# Patient Record
Sex: Male | Born: 1950
Health system: Southern US, Community
[De-identification: ages and names within clinical notes are randomized; demographics above are authoritative.]

## PROBLEM LIST (undated history)

## (undated) DIAGNOSIS — E790 Hyperuricemia without signs of inflammatory arthritis and tophaceous disease: Secondary | ICD-10-CM

## (undated) DIAGNOSIS — M1990 Unspecified osteoarthritis, unspecified site: Secondary | ICD-10-CM

## (undated) DIAGNOSIS — M51369 Other intervertebral disc degeneration, lumbar region without mention of lumbar back pain or lower extremity pain: Secondary | ICD-10-CM

## (undated) DIAGNOSIS — N200 Calculus of kidney: Secondary | ICD-10-CM

## (undated) DIAGNOSIS — H04123 Dry eye syndrome of bilateral lacrimal glands: Secondary | ICD-10-CM

## (undated) DIAGNOSIS — R748 Abnormal levels of other serum enzymes: Secondary | ICD-10-CM

## (undated) DIAGNOSIS — D696 Thrombocytopenia, unspecified: Secondary | ICD-10-CM

## (undated) DIAGNOSIS — I10 Essential (primary) hypertension: Secondary | ICD-10-CM

## (undated) DIAGNOSIS — K219 Gastro-esophageal reflux disease without esophagitis: Secondary | ICD-10-CM

## (undated) DIAGNOSIS — R9389 Abnormal findings on diagnostic imaging of other specified body structures: Secondary | ICD-10-CM

## (undated) DIAGNOSIS — M5136 Other intervertebral disc degeneration, lumbar region: Secondary | ICD-10-CM

## (undated) DIAGNOSIS — D693 Immune thrombocytopenic purpura: Secondary | ICD-10-CM

## (undated) DIAGNOSIS — G4730 Sleep apnea, unspecified: Secondary | ICD-10-CM

## (undated) HISTORY — DX: Calculus of kidney: N20.0

## (undated) HISTORY — DX: Gastro-esophageal reflux disease without esophagitis: K21.9

## (undated) HISTORY — DX: Hyperuricemia without signs of inflammatory arthritis and tophaceous disease: E79.0

## (undated) HISTORY — DX: Dry eye syndrome of bilateral lacrimal glands: H04.123

## (undated) HISTORY — DX: Abnormal findings on diagnostic imaging of other specified body structures: R93.89

## (undated) HISTORY — PX: CARDIAC CATHETERIZATION: SHX172

## (undated) HISTORY — PX: LITHOTRIPSY: SUR834

## (undated) HISTORY — PX: KNEE SURGERY: SHX244

## (undated) HISTORY — PX: OTHER SURGICAL HISTORY: SHX169

---

## 1999-07-05 ENCOUNTER — Emergency Department (HOSPITAL_COMMUNITY): Admission: EM | Admit: 1999-07-05 | Discharge: 1999-07-05 | Payer: Self-pay | Admitting: *Deleted

## 2001-01-04 ENCOUNTER — Emergency Department (HOSPITAL_COMMUNITY): Admission: EM | Admit: 2001-01-04 | Discharge: 2001-01-04 | Payer: Self-pay | Admitting: Emergency Medicine

## 2001-01-04 ENCOUNTER — Encounter: Payer: Self-pay | Admitting: Emergency Medicine

## 2001-07-09 ENCOUNTER — Ambulatory Visit (HOSPITAL_BASED_OUTPATIENT_CLINIC_OR_DEPARTMENT_OTHER): Admission: RE | Admit: 2001-07-09 | Discharge: 2001-07-09 | Payer: Self-pay | Admitting: Urology

## 2001-07-09 ENCOUNTER — Encounter: Payer: Self-pay | Admitting: Urology

## 2002-09-04 ENCOUNTER — Encounter: Payer: Self-pay | Admitting: Emergency Medicine

## 2002-09-04 ENCOUNTER — Emergency Department (HOSPITAL_COMMUNITY): Admission: EM | Admit: 2002-09-04 | Discharge: 2002-09-05 | Payer: Self-pay | Admitting: Emergency Medicine

## 2005-08-22 ENCOUNTER — Encounter: Admission: RE | Admit: 2005-08-22 | Discharge: 2005-08-22 | Payer: Self-pay | Admitting: Orthopedic Surgery

## 2007-07-02 HISTORY — PX: KNEE ARTHROSCOPY: SUR90

## 2009-05-19 ENCOUNTER — Encounter: Payer: Self-pay | Admitting: Internal Medicine

## 2009-06-06 ENCOUNTER — Encounter (INDEPENDENT_AMBULATORY_CARE_PROVIDER_SITE_OTHER): Payer: Self-pay | Admitting: *Deleted

## 2009-06-12 ENCOUNTER — Ambulatory Visit: Payer: Self-pay | Admitting: Internal Medicine

## 2009-06-12 DIAGNOSIS — K644 Residual hemorrhoidal skin tags: Secondary | ICD-10-CM | POA: Insufficient documentation

## 2009-06-12 DIAGNOSIS — K219 Gastro-esophageal reflux disease without esophagitis: Secondary | ICD-10-CM | POA: Insufficient documentation

## 2009-06-12 DIAGNOSIS — E790 Hyperuricemia without signs of inflammatory arthritis and tophaceous disease: Secondary | ICD-10-CM | POA: Insufficient documentation

## 2009-06-12 DIAGNOSIS — R930 Abnormal findings on diagnostic imaging of skull and head, not elsewhere classified: Secondary | ICD-10-CM | POA: Insufficient documentation

## 2009-06-12 DIAGNOSIS — Z87442 Personal history of urinary calculi: Secondary | ICD-10-CM | POA: Insufficient documentation

## 2009-06-19 ENCOUNTER — Encounter (INDEPENDENT_AMBULATORY_CARE_PROVIDER_SITE_OTHER): Payer: Self-pay | Admitting: *Deleted

## 2009-07-01 HISTORY — PX: TOTAL KNEE ARTHROPLASTY: SHX125

## 2009-07-01 HISTORY — PX: COLONOSCOPY W/ POLYPECTOMY: SHX1380

## 2009-07-01 HISTORY — PX: OTHER SURGICAL HISTORY: SHX169

## 2009-08-04 ENCOUNTER — Encounter (INDEPENDENT_AMBULATORY_CARE_PROVIDER_SITE_OTHER): Payer: Self-pay | Admitting: *Deleted

## 2009-08-04 ENCOUNTER — Ambulatory Visit: Payer: Self-pay | Admitting: Gastroenterology

## 2009-08-04 DIAGNOSIS — K625 Hemorrhage of anus and rectum: Secondary | ICD-10-CM | POA: Insufficient documentation

## 2009-08-14 ENCOUNTER — Ambulatory Visit: Payer: Self-pay | Admitting: Gastroenterology

## 2009-08-15 ENCOUNTER — Encounter: Payer: Self-pay | Admitting: Gastroenterology

## 2009-08-15 ENCOUNTER — Telehealth: Payer: Self-pay | Admitting: Gastroenterology

## 2009-08-15 DIAGNOSIS — E538 Deficiency of other specified B group vitamins: Secondary | ICD-10-CM | POA: Insufficient documentation

## 2009-08-15 DIAGNOSIS — R740 Nonspecific elevation of levels of transaminase and lactic acid dehydrogenase [LDH]: Secondary | ICD-10-CM

## 2009-08-15 LAB — CONVERTED CEMR LAB
ALT: 70 units/L — ABNORMAL HIGH (ref 0–53)
Alkaline Phosphatase: 45 units/L (ref 39–117)
BUN: 13 mg/dL (ref 6–23)
Bilirubin, Direct: 0.2 mg/dL (ref 0.0–0.3)
Calcium: 9.2 mg/dL (ref 8.4–10.5)
Chloride: 101 meq/L (ref 96–112)
Ferritin: 47 ng/mL (ref 22.0–322.0)
Glucose, Bld: 119 mg/dL — ABNORMAL HIGH (ref 70–99)
Hemoglobin: 14.9 g/dL (ref 13.0–17.0)
Lymphocytes Relative: 16.2 % (ref 12.0–46.0)
MCHC: 33.3 g/dL (ref 30.0–36.0)
Monocytes Relative: 8.4 % (ref 3.0–12.0)
Neutrophils Relative %: 70.7 % (ref 43.0–77.0)
Platelets: 145 10*3/uL — ABNORMAL LOW (ref 150.0–400.0)
RBC: 4.28 M/uL (ref 4.22–5.81)
RDW: 13 % (ref 11.5–14.6)
Sed Rate: 16 mm/hr (ref 0–22)
Total Protein: 7.1 g/dL (ref 6.0–8.3)
Transferrin: 335.5 mg/dL (ref 212.0–360.0)
WBC: 5 10*3/uL (ref 4.5–10.5)

## 2009-08-18 ENCOUNTER — Telehealth (INDEPENDENT_AMBULATORY_CARE_PROVIDER_SITE_OTHER): Payer: Self-pay | Admitting: *Deleted

## 2009-08-18 ENCOUNTER — Ambulatory Visit: Payer: Self-pay | Admitting: Gastroenterology

## 2009-08-18 LAB — CONVERTED CEMR LAB: Prothrombin Time: 10.8 s (ref 9.1–11.7)

## 2009-08-25 LAB — CONVERTED CEMR LAB: HCV Ab: NEGATIVE

## 2009-12-04 ENCOUNTER — Encounter: Payer: Self-pay | Admitting: Internal Medicine

## 2010-01-09 ENCOUNTER — Telehealth: Payer: Self-pay | Admitting: Gastroenterology

## 2010-01-10 ENCOUNTER — Telehealth: Payer: Self-pay | Admitting: Gastroenterology

## 2010-03-02 ENCOUNTER — Inpatient Hospital Stay (HOSPITAL_COMMUNITY): Admission: RE | Admit: 2010-03-02 | Discharge: 2010-03-05 | Payer: Self-pay | Admitting: Orthopedic Surgery

## 2010-06-07 ENCOUNTER — Encounter: Payer: Self-pay | Admitting: Internal Medicine

## 2010-08-02 NOTE — Procedures (Signed)
Summary: Colonoscopy  Patient: [name] Note: All result statuses are Final unless otherwise noted.  Tests: (1) Colonoscopy (COL)   COL Colonoscopy           DONE     [facility]     [address]     [place], [place]  [address]           COLONOSCOPY PROCEDURE REPORT           PATIENT:  Timothy Mckinney, Timothy Mckinney  MR#:  [id number]     BIRTHDATE:  May 31, 1951, 58 yrs. old  GENDER:  male           ENDOSCOPIST:  Vania Rea. Jarold Motto, MD, [facility]     Referred by:           PROCEDURE DATE:  08/14/2009     PROCEDURE:  Colonoscopy with biopsy and snare polypectomy     ASA CLASS:  Class II     INDICATIONS:  rectal bleeding           MEDICATIONS:   Fentanyl 100 mcg IV, Versed 10 mg IV           DESCRIPTION OF PROCEDURE:   After the risks benefits and     alternatives of the procedure were thoroughly explained, informed     consent was obtained.  Digital rectal exam was performed and     revealed no abnormalities.   The LB [id number] endoscope     was introduced through the anus and advanced to the cecum, which     was identified by both the appendix and ileocecal valve, without     limitations.  The quality of the prep was excellent, using     MoviPrep.  The instrument was then slowly withdrawn as the colon     was fully examined.     <<PROCEDUREIMAGES>>           FINDINGS:  There were multiple polyps identified and removed.     sigmoid to descending FLAT POLYPS EXCISED WITH COLD SNARE.  An     ulcer was found in the rectum. SOLITARY RECTAL ULCER BIOPSIED.     ULCERATION NOTED.  The scope was then withdrawn from the patient and     the procedure completed.           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     RECOMMENDATIONS:     CANASA 1GM. SUPP QHS FOR 1 MONTH.#31,REFILL X 6.           REPEAT EXAM:  No        ______________________________     Vania Rea. Jarold Motto, MD, [facility]           CC:  Pecola Lawless, MD           n.     Rosalie DoctorMarland Kitchen   Vania Rea. Patterson at 08/14/2009 12:09 PM           Joaquin Music, [phone]  Note: An exclamation mark (!) indicates a result that was not dispersed into the flowsheet. Document Creation Date: 08/14/2009 12:10 PM _______________________________________________________________________  (1) Order result status: Final Collection or observation date-time: 08/14/2009 12:02 Requested date-time:  Receipt date-time:  Reported date-time:  Referring Physician:  Ordering Physician: Sheryn Bison [phone]) Specimen Source:  Source: Launa Grill Order Number: [id number] Lab site:   Appended Document: Colonoscopy     Procedures Next Due Date:    Colonoscopy: 08/2014

## 2010-08-02 NOTE — Progress Notes (Signed)
Summary: Medication  Phone Note Call from Patient Call back at Work Phone [phone]   Caller: Patient Call For: Dr. Jarold Motto Reason for Call: Refill Medication Summary of Call: pt. needs a refill of his Canasa for supply   [facility] Initial call taken by: Karna Christmas,  January 09, 2010 2:13 PM    Prescriptions: CANASA 1000 MG  SUPP (MESALAMINE) canasa suppository  gm 1 q hs x 1 month  #90 x 1   Entered by:   Ashok Cordia RN   Authorized by:   Mardella Layman MD [facility]   Signed by:   Ashok Cordia RN on 01/09/2010   Method used:   Electronically to        [facility]* (retail)       [address]       [address] Shipping/mailing       [place], [place]  [address]       Ph: [phone]       Fax: [phone]   RxID:   [id number]

## 2010-08-02 NOTE — Assessment & Plan Note (Signed)
Summary: #1 of 3 weekly B12 inj, 266.2, dfs  Nurse Visit   Allergies: 1)  ! Penicillin  Medication Administration  Injection # 1:    Medication: Vit B12 1000 mcg    Diagnosis: B12 DEFICIENCY (ICD-266.2)    Route: IM    Site: R deltoid    Exp Date: 05/2011    Lot #: [id number]    Mfr: [employer] Regent    Comments: Pt to be given the rest of his injections at home by his wife. Lupita Leash Aware    Patient tolerated injection without complications    Given by: Merri Ray CMA (AAMA) (August 18, 2009 9:15 AM)  Orders Added: 1)  Vit B12 1000 mcg [J3420]

## 2010-08-02 NOTE — Assessment & Plan Note (Signed)
Summary: HEMORROIDS EXTERNAL   History of Present Illness Visit Type: consult Primary GI MD: Sheryn Bison MD FACP FAGA Primary Provider: Marga Melnick, MD Requesting Provider: Marga Melnick, MD Chief Complaint: hemorrhoids  History of Present Illness:   60 year old Caucasian male with a chronic history of hemorrhoidal bleeding with last colonoscopy 12 years ago. He continues with mild constipation, gas, bloating, bright red blood with straining. He follows a regular diet and denies specific food intolerances and has recently been placed on a low carbohydrate diet by primary care. Family history noncontributory. His rectal bleed occurs with hard stools and he does sometimes have to manually disimpact his rectum. Family history is noncontributory except for polyps in his mother.. Review of his records does not show recent labs.   GI Review of Systems    Reports acid reflux and  heartburn.      Denies abdominal pain, belching, bloating, chest pain, dysphagia with liquids, dysphagia with solids, loss of appetite, nausea, vomiting, vomiting blood, weight loss, and  weight gain.      Reports hemorrhoids and  rectal bleeding.     Denies anal fissure, black tarry stools, change in bowel habit, constipation, diarrhea, diverticulosis, fecal incontinence, heme positive stool, irritable bowel syndrome, jaundice, light color stool, liver problems, and  rectal pain.    Current Medications (verified): 1)  Allopurinol 300 Mg Tabs (Allopurinol) .Marland Kitchen.. 1 By Mouth Once Daily 2)  Urocit-K 10 10 Meq (1080 Mg) Cr-Tabs (Potassium Citrate) .... 2 By Mouth Three Times A Day 3)  Sodium 650mg  .... 3 By Mouth Three Times A Day To Raise Urine Ph 4)  Restasis 0.05 % Emul (Cyclosporine) .Marland Kitchen.. 1 Drop in Each Eye Daily 5)  Flax Seed Oil 1000 Mg Caps (Flaxseed (Linseed)) .... 2 By Mouth Once Daily 6)  Proctofoam 1 % Foam (Pramoxine Hcl) .... Apply Two Times A Day After Sitz Bath 7)  Tylenol Pm Extra Strength 500-25 Mg  Tabs (Diphenhydramine-Apap (Sleep)) .... One Tablet By Mouth At Bedtime  Allergies (verified): 1)  ! Penicillin  Past History:  Past medical, surgical, family and social histories (including risk factors) reviewed for relevance to current acute and chronic problems.  Past Medical History: Reviewed history from 06/12/2009 and no changes required. 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 from topical steroids for ? conjunctivitis, Dr Hazle Quant; Abnormal Chest Xray , PMH of Nephrolithiasis, hx of, Dr Aldean Ast, S/P Lithotripsy X1 GERD  Past Surgical History: Reviewed history from 06/12/2009 and no changes required. Tear duct plugs; Cath in 40s for chest pain , negative, ERD diagnosed; Athroscopy 2009 for medial meniscus tear( pre op CPX done 05/2008, labs WNL)  Family History: Reviewed history and no changes required. No FH of Colon Cancer: Family History of Colon Polyps:Mother   Social History: Reviewed history and no changes required. Occupation: Sales Married 2 childern Patient has never smoked.  Alcohol Use - yes: 2 daily  Illicit Drug Use - no Smoking Status:  never Drug Use:  no  Review of Systems       The patient complains of arthritis/joint pain and sleeping problems.  The patient denies allergy/sinus, anemia, anxiety-new, back pain, blood in urine, breast changes/lumps, change in vision, confusion, cough, coughing up blood, depression-new, fainting, fatigue, fever, headaches-new, hearing problems, heart murmur, heart rhythm changes, itching, muscle pains/cramps, night sweats, nosebleeds, shortness of breath, skin rash, sore throat, swelling of feet/legs, swollen lymph glands, thirst - excessive, urination - excessive, urination changes/pain, urine leakage, vision changes, and voice  change.    Vital Signs:  Patient profile:   60 year old male Height:      71.75 inches Weight:      289 pounds BMI:     39.61 BSA:     2.49 Pulse rate:   76 /  minute Pulse rhythm:   regular BP sitting:   134 / 80  (left arm) Cuff size:   large  Vitals Entered By: Ok Anis CMA (August 04, 2009 9:52 AM)  Physical Exam  General:  Well developed, well nourished, no acute distress.obese.   Head:  Normocephalic and atraumatic. Eyes:  PERRLA, no icterus.exam deferred to patient's ophthalmologist.  exam deferred to patient's ophthalmologist.   Lungs:  Clear throughout to auscultation. Heart:  Regular rate and rhythm; no murmurs, rubs,  or bruits. Abdomen:  Soft, nontender and nondistended. No masses, hepatosplenomegaly or hernias noted. Normal bowel sound. Ventral hernia present in the epigastric area. There are no masses or tenderness or organomegaly. Rectal:  External hemorrhoids with hypertrophied papilla noted. Digital exam not performed.deferred until time of colonoscopy.  deferred until time of colonoscopy.   Extremities:  No clubbing, cyanosis, edema or deformities noted. Neurologic:  Alert and  oriented x4;  grossly normal neurologically. Inguinal Nodes:  No significant inguinal adenopathy. Psych:  Alert and cooperative. Normal mood and affect.   Impression & Recommendations:  Problem # 1:  RECTAL BLEEDING (ICD-569.3) Assessment Unchanged  Colonoscopy scheduled at his convenience. I have placed him on Phillip'sColon Health preparation twice a day, have asked him to avoid sorbitol and fructose, continue liberal p.o. fluids and local hemorrhoidal care. Screening labs also have been ordered. The patient is not interested in surgical treatment of his hemorrhoids. He does give a history of some manual disimpaction needs suggesting anorectal outlet dysfunction. Depending on colonoscopy exam, he may need referral for anorectal manometry. Orders: TLB-CBC Platelet - w/Differential (85025-CBCD) TLB-BMP (Basic Metabolic Panel-BMET) (80048-METABOL) TLB-Hepatic/Liver Function Pnl (80076-HEPATIC) TLB-TSH (Thyroid Stimulating Hormone)  (84443-TSH) TLB-B12, Serum-Total ONLY ([address]-U04) TLB-Ferritin (82728-FER) TLB-Folic Acid (Folate) (82746-FOL) TLB-IBC Pnl (Iron/FE;Transferrin) (83550-IBC) TLB-Sedimentation Rate (ESR) (85652-ESR) Colonoscopy (Colon)  Problem # 2:  FAMILY HISTORY COLONIC POLYPS (ICD-V18.51) Assessment: Comment Only  Orders: Colonoscopy (Colon)  Problem # 3:  HEMORRHOIDS, EXTERNAL (ICD-455.3) Assessment: Improved continue ProctoFoam and Sitz mass twice a day. Labs have been ordered to exclude significant iron deficiency. Orders: TLB-CBC Platelet - w/Differential (85025-CBCD) TLB-BMP (Basic Metabolic Panel-BMET) (80048-METABOL) TLB-Hepatic/Liver Function Pnl (80076-HEPATIC) TLB-TSH (Thyroid Stimulating Hormone) (84443-TSH) TLB-B12, Serum-Total ONLY (54098-J19) TLB-Ferritin (82728-FER) TLB-Folic Acid (Folate) (82746-FOL) TLB-IBC Pnl (Iron/FE;Transferrin) (83550-IBC) TLB-Sedimentation Rate (ESR) (85652-ESR) Colonoscopy (Colon)  Problem # 4:  GERD (ICD-530.81) Assessment: Improved He Denies any upper gastrointestinal or hepatobiliary complaints at this time.  Patient Instructions: 1)  Copy sent to : Dr. Marga Melnick 2)  Diet should be high in fiber ( fruits, vegetables, whole grains) but low in residue. Drink at least eight (8) glasses of water a day.  3)  Constipation and Hemorrhoids brochure given.  4)  Colonoscopy and Flexible Sigmoidoscopy brochure given.  5)  Conscious Sedation brochure given.  6)  Labs Pending. 7)  Begin Vear Clock Colon health two times a day. 8)  The medication list was reviewed and reconciled.  All changed / newly prescribed medications were explained.  A complete medication list was provided to the patient / caregiver. Prescriptions: MOVIPREP 100 GM  SOLR (PEG-KCL-NACL-NASULF-NA ASC-C) As per prep instructions.  #1 x 0   Entered by:   Ashok Cordia RN   Authorized  by:   Mardella Layman MD [facility]   Signed by:   Ashok Cordia RN on 08/04/2009   Method used:    Electronically to        [id number]* (retail)       [address]       [place], [place]  [address]       Ph: [phone] or [phone]       Fax: [phone]   RxID:   [id number]

## 2010-08-02 NOTE — Progress Notes (Signed)
Summary: Triage  Phone Note Call from Patient Call back at Work Phone [phone]   Caller: Patient Call For: Dr. Jarold Motto Summary of Call: Wants to discuss having some B-12 shots and some bloodwork done Initial call taken by: Karna Christmas,  August 15, 2009 1:56 PM  Follow-up for Phone Call        Pt will be in town Friday and would like to get B12 inj and have labs drawn.  (see append to labs)   Wife is in nursing school and she will be able to give the rest of the B12 injections.   Pt will have to let us know when he can go for the [place]. Follow-up by: Ashok Cordia RN,  August 15, 2009 2:23 PM

## 2010-08-02 NOTE — Letter (Signed)
Summary: [facility]  [facility]   Imported By: Lanelle Bal 12/21/2009 09:19:02  _____________________________________________________________________  External Attachment:    Type:   Image     Comment:   External Document

## 2010-08-02 NOTE — Letter (Signed)
Summary: Patient Notice- Polyp Results  [facility]  [address] [place], [place] [address]   Phone: [phone]  Fax: [phone]        August 15, 2009 MRN: [id number]    BLISS TSANG [address] [place], [place]  [address]    Dear Mr. Soldo,  I am pleased to inform you that the colon polyp(s) removed during your recent colonoscopy was (were) found to be benign (no cancer detected) upon pathologic examination.  I recommend you have a repeat colonoscopy examination in 5_ years to look for recurrent polyps, as having colon polyps increases your risk for having recurrent polyps or even colon cancer in the future.  Should you develop new or worsening symptoms of abdominal pain, bowel habit changes or bleeding from the rectum or bowels, please schedule an evaluation with either your primary care physician or with me.  Additional information/recommendations:  __ No further action with gastroenterology is needed at this time. Please      follow-up with your primary care physician for your other healthcare      needs.  __ Please call [phone] to schedule a return visit to review your      situation.  __ Please keep your follow-up visit as already scheduled.  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 Please call us if you are having persistent problems or have questions about your condition that have not been fully answered at this time.  Sincerely,  Mardella Layman MD [facility]  This letter has been electronically signed by your physician.  Appended Document: Patient Notice- Polyp Results Letter mailed 2.16.11

## 2010-08-02 NOTE — Letter (Signed)
Summary: [facility] Instructions  [facility]  [address] [place], [place] [address]   Phone: [phone]  Fax: [phone]       Timothy Mckinney    11-30-1950    MRN: [id number]        Procedure Day /Date:  Monday, 08/14/09     Arrival Time: 10:30      Procedure Time: 11:30     [facility] of Procedure:                    [facility]                        PREPARATION FOR COLONOSCOPY WITH MOVIPREP   Starting 5 days prior to your procedure 08/09/09 do not eat nuts, seeds, popcorn, corn, beans, peas,  salads, or any raw vegetables.  Do not take any fiber supplements (e.g. Metamucil, Citrucel, and Benefiber).  THE DAY BEFORE YOUR PROCEDURE         DATE: 08/13/09  DAY: Sunday  1.  Drink clear liquids the entire day-NO SOLID FOOD  2.  Do not drink anything colored red or purple.  Avoid juices with pulp.  No orange juice.  3.  Drink at least 64 oz. (8 glasses) of fluid/clear liquids during the day to prevent dehydration and help the prep work efficiently.  CLEAR LIQUIDS INCLUDE: Water Jello Ice Popsicles Tea (sugar ok, no milk/cream) Powdered fruit flavored drinks Coffee (sugar ok, no milk/cream) Gatorade Juice: apple, white grape, white cranberry  Lemonade Clear bullion, consomm, broth Carbonated beverages (any kind) Strained chicken noodle soup Hard Candy                             4.  In the morning, mix first dose of MoviPrep solution:    Empty 1 Pouch A and 1 Pouch B into the disposable container    Add lukewarm drinking water to the top line of the container. Mix to dissolve    Refrigerate (mixed solution should be used within 24 hrs)  5.  Begin drinking the prep at 5:00 p.m. The MoviPrep container is divided by 4 marks.   Every 15 minutes drink the solution down to the next mark (approximately 8 oz) until the full liter is complete.   6.  Follow completed prep with 16 oz of clear liquid of your choice (Nothing red  or purple).  Continue to drink clear liquids until bedtime.  7.  Before going to bed, mix second dose of MoviPrep solution:    Empty 1 Pouch A and 1 Pouch B into the disposable container    Add lukewarm drinking water to the top line of the container. Mix to dissolve    Refrigerate  THE DAY OF YOUR PROCEDURE      DATE: 08/14/09  DAY: Monday  Beginning at 6;30 a.m. (5 hours before procedure):         1. Every 15 minutes, drink the solution down to the next mark (approx 8 oz) until the full liter is complete.  2. Follow completed prep with 16 oz. of clear liquid of your choice.    3. You may drink clear liquids until 9:30  (2 HOURS BEFORE PROCEDURE).   MEDICATION INSTRUCTIONS  Unless otherwise instructed, you should take regular prescription medications with a small sip of water   as early as possible the morning  of your procedure.                 OTHER INSTRUCTIONS  You will need a responsible adult at least 62 years of age to accompany you and drive you home.   This person must remain in the waiting room during your procedure.  Wear loose fitting clothing that is easily removed.  Leave jewelry and other valuables at home.  However, you may wish to bring a book to read or  an iPod/MP3 player to listen to music as you wait for your procedure to start.  Remove all body piercing jewelry and leave at home.  Total time from sign-in until discharge is approximately 2-3 hours.  You should go home directly after your procedure and rest.  You can resume normal activities the  day after your procedure.  The day of your procedure you should not:   Drive   Make legal decisions   Operate machinery   Drink alcohol   Return to work  You will receive specific instructions about eating, activities and medications before you leave.    The above instructions have been reviewed and explained to me by   _______________________    I fully understand and can  verbalize these instructions _____________________________ Date _________

## 2010-08-02 NOTE — Progress Notes (Signed)
Summary: Appointment  Phone Note Call from Patient   Caller: Patient Summary of Call: Pt calling. Asking if he needs follow up appt.  Pt states he is doing very well using canasa supp as needed.  Pt instructed to call if symptoms worsened and we could sch follow up.  refill given. Initial call taken by: Ashok Cordia RN,  January 10, 2010 4:10 PM

## 2010-08-02 NOTE — Miscellaneous (Signed)
Summary: canasa rx  Clinical Lists Changes  Medications: Added new medication of CANASA 1000 MG  SUPP (MESALAMINE) canasa suppository  gm 1 q hs x 1 month - Signed Rx of CANASA 1000 MG  SUPP (MESALAMINE) canasa suppository  gm 1 q hs x 1 month;  #31 x 6;  Signed;  Entered by: Weston Brass;  Authorized by: Mardella Layman MD [facility];  Method used: Electronically to [facility] [address] [facility] [id number]*, [address] [place], [place], [place]  [address], Ph: [phone] or [phone], Fax: [phone] Observations: Added new observation of ALLERGY REV: Done (08/14/2009 12:24)    Prescriptions: CANASA 1000 MG  SUPP (MESALAMINE) canasa suppository  gm 1 q hs x 1 month  #31 x 6   Entered by:   Weston Brass   Authorized by:   Mardella Layman MD [facility]   Signed by:   Weston Brass on 08/14/2009   Method used:   Electronically to        [facility]  [address] [facility]* (retail)       [address]       [place], [place]  [address]       Ph: [phone] or [phone]       Fax: [phone]   RxID:   [id number]

## 2010-08-02 NOTE — Progress Notes (Signed)
Summary: B12   Phone Note Outgoing Call   Summary of Call: Pt request that we call in B12 and syringe.  His wife is going to give him the injections. Initial call taken by: Ashok Cordia RN,  August 18, 2009 4:41 PM    New/Updated Medications: CYANOCOBALAMIN 1000 MCG/ML INJ SOLN (CYANOCOBALAMIN) 1 cc IM  monthly SYRINGE DISPOSABLE 3 ML MISC (SYRINGE (DISPOSABLE)) Inject B12 IM monthly Prescriptions: SYRINGE DISPOSABLE 3 ML MISC (SYRINGE (DISPOSABLE)) Inject B12 IM monthly  #12 x 6   Entered by:   Ashok Cordia RN   Authorized by:   Mardella Layman MD [facility]   Signed by:   Ashok Cordia RN on 08/18/2009   Method used:   Electronically to        [facility]  [address] [facility]* (retail)       [address]       [place], [place]  [address]       Ph: [phone] or [phone]       Fax: [phone]   RxID:   [id number] CYANOCOBALAMIN 1000 MCG/ML INJ SOLN (CYANOCOBALAMIN) 1 cc IM  monthly  #12 x 6   Entered by:   Ashok Cordia RN   Authorized by:   Mardella Layman MD [facility]   Signed by:   Ashok Cordia RN on 08/18/2009   Method used:   Electronically to        [id number]* (retail)       [address]       [place], [place]  [address]       Ph: [phone] or [phone]       Fax: [phone]   RxID:   [id number]

## 2010-08-02 NOTE — Letter (Signed)
Summary: [facility]  [facility]   Imported By: Lanelle Bal 06/27/2010 09:48:41  _____________________________________________________________________  External Attachment:    Type:   Image     Comment:   External Document

## 2010-09-13 LAB — BASIC METABOLIC PANEL
BUN: 4 mg/dL — ABNORMAL LOW (ref 6–23)
CO2: 27 mEq/L (ref 19–32)
CO2: 28 mEq/L (ref 19–32)
Calcium: 8.1 mg/dL — ABNORMAL LOW (ref 8.4–10.5)
Calcium: 8.1 mg/dL — ABNORMAL LOW (ref 8.4–10.5)
Calcium: 8.4 mg/dL (ref 8.4–10.5)
Chloride: 102 mEq/L (ref 96–112)
Creatinine, Ser: 0.97 mg/dL (ref 0.4–1.5)
GFR calc Af Amer: >60 mL/min (ref >60)
GFR calc non Af Amer: >60 mL/min (ref >60)
GFR calc non Af Amer: >60 mL/min (ref >60)
Glucose, Bld: 121 mg/dL — ABNORMAL HIGH (ref 70–99)
Potassium: 3.5 mEq/L (ref 3.5–5.1)
Potassium: 3.9 mEq/L (ref 3.5–5.1)
Sodium: 134 mEq/L — ABNORMAL LOW (ref 135–145)
Sodium: 135 mEq/L (ref 135–145)
Sodium: 136 mEq/L (ref 135–145)

## 2010-09-13 LAB — CBC
HCT: 28.8 % — ABNORMAL LOW (ref 39.0–52.0)
HCT: 34.4 % — ABNORMAL LOW (ref 39.0–52.0)
Hemoglobin: 9.8 g/dL — ABNORMAL LOW (ref 13.0–17.0)
MCH: 35.2 pg — ABNORMAL HIGH (ref 26.0–34.0)
MCH: 35.7 pg — ABNORMAL HIGH (ref 26.0–34.0)
MCHC: 34 g/dL (ref 30.0–36.0)
MCHC: 34.3 g/dL (ref 30.0–36.0)
Platelets: 111 10*3/uL — ABNORMAL LOW (ref 150–400)
RBC: 2.82 MIL/uL — ABNORMAL LOW (ref 4.22–5.81)
RDW: 13.3 % (ref 11.5–15.5)
WBC: 3.9 10*3/uL — ABNORMAL LOW (ref 4.0–10.5)
WBC: 4.9 10*3/uL (ref 4.0–10.5)
WBC: 5.7 10*3/uL (ref 4.0–10.5)

## 2010-09-13 LAB — PROTIME-INR
INR: 1.16 (ref 0.00–1.49)
INR: 1.45 (ref 0.00–1.49)
Prothrombin Time: 15 seconds (ref 11.6–15.2)
Prothrombin Time: 17.8 seconds — ABNORMAL HIGH (ref 11.6–15.2)

## 2010-09-14 LAB — BASIC METABOLIC PANEL
CO2: 30 mEq/L (ref 19–32)
Calcium: 8.9 mg/dL (ref 8.4–10.5)
Creatinine, Ser: 0.89 mg/dL (ref 0.4–1.5)

## 2010-09-14 LAB — DIFFERENTIAL
Basophils Absolute: 0 10*3/uL (ref 0.0–0.1)
Eosinophils Absolute: 0.1 10*3/uL (ref 0.0–0.7)
Eosinophils Relative: 3 % (ref 0–5)
Lymphocytes Relative: 36 % (ref 12–46)
Lymphs Abs: 1.5 10*3/uL (ref 0.7–4.0)
Monocytes Absolute: 0.4 10*3/uL (ref 0.1–1.0)
Monocytes Relative: 11 % (ref 3–12)

## 2010-09-14 LAB — URINALYSIS, ROUTINE W REFLEX MICROSCOPIC
Bilirubin Urine: NEGATIVE
Glucose, UA: NEGATIVE mg/dL
Hgb urine dipstick: NEGATIVE
Protein, ur: NEGATIVE mg/dL
pH: 8.5 — ABNORMAL HIGH (ref 5.0–8.0)

## 2010-09-14 LAB — CBC
MCV: 102.3 fL — ABNORMAL HIGH (ref 78.0–100.0)
RBC: 4.31 MIL/uL (ref 4.22–5.81)

## 2010-09-14 LAB — ABO/RH: ABO/RH(D): O POS

## 2010-09-14 LAB — TYPE AND SCREEN
ABO/RH(D): O POS
Antibody Screen: NEGATIVE

## 2010-09-14 LAB — APTT: aPTT: 30 seconds (ref 24–37)

## 2010-09-14 LAB — SURGICAL PCR SCREEN
MRSA, PCR: NEGATIVE
Staphylococcus aureus: POSITIVE — AB

## 2010-09-14 LAB — PROTIME-INR: Prothrombin Time: 12.6 seconds (ref 11.6–15.2)

## 2010-11-16 NOTE — Consult Note (Signed)
[facility]  Patient:    Timothy Mckinney, Timothy Mckinney                         MRN: [id number] Proc. Date: 01/04/01 Adm. Date:  [id number] Disc. Date: [id number] Attending:  Ilene Qua CC:         Lilyan Punt Sydnee Levans, M.D.  Danice Goltz, M.D.   Consultation Report  CHIEF COMPLAINT:  Chest pain.  HISTORY OF PRESENT ILLNESS:  [name] is a 60 year old white male with a history of obesity and long standing gastroesophageal reflux disease. He presents tonight with a complaint of chest pain. The patient states he has been having chest pain off and on for the past two weeks. It has not been associated with exertion. His pain is described as a mid sternal pressure associated with epigastric discomfort and indigestion and has usually been relieved with over the counter Zantac but today his pain did not go away after taking Zantac and he came to the emergency room. His pain did resolve prior to being admitted to the emergency room. The patient reports that he has been under increased job related stress. His diet has been for a lot of fast food and the patient also gorged himself last night at a lobsterfest. He also admits that he has been drinking a lot of alcohol over the past four days with the holiday weekend.  The patient reports that he has had the same pain in the past. He was admitted eight years ago at a hospital in [place] with the exact same pain and had a normal cardiac catheterization. He was admitted here in January of 2001 with the same pain and apparently had an upper GI series done and a chest CT.  PAST MEDICAL HISTORY:  Significant for obesity, kidney stones, gastroesophageal reflux disease. He has a history of lung nodules and was worked up by Dr. Jayme Cloud in January of 2001 and had a chest CT at that time. This was felt to be benign. He has had no prior surgeries.  ALLERGIES:  PENICILLIN.  MEDICATIONS:  Allopurinol and p.r.n. Zantac.  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 He is married. He denies smoking. He does drink at least a moderate amount of alcohol with recent increased use.  FAMILY HISTORY:  Father died age 39 of myocardial infarction. Mother died with colon cancer at age 62.  REVIEW OF SYSTEMS:  No change in bowel habits. No bleeding. No edema or orthopnea. No flank pain.  PHYSICAL EXAMINATION:  VITAL SIGNS:  Blood pressure is 130.84, pulse 76 and regular. He is afebrile. Temperature is 98.8.  HEENT:  Pupils equal round and reactive to light and accommodation. Extraocular movements intact.  NECK:  Supple without JVD, adenopathy, thyromegaly or bruits.  LUNGS:  Clear.  CARDIAC:  Reveals a regular rate and rhythm without murmurs, rubs or gallops or clicks.  ABDOMEN:  Obese, soft, nontender. Bowel sounds are positive.  EXTREMITIES:  Without edema. Pulses are 2+ and symmetric.  NEUROLOGIC:  Intact.  LABORATORY DATA:  ECG shows normal sinus rhythm with incomplete right bundle branch block otherwise normal. Chest x-ray shows a right upper lobe nodule versus mass. This was described on a previous report in January of 2001 but those old films are not available for comparison. CK was 222 with 1.4 MB, troponin 0.04. Sodium 136, potassium 3.6, chloride 105, CO2 24, BUN 11, creatinine 0.9, glucose 99, SGOT  43, SGPT of 44. White count 5900, hemoglobin 14.4, hematocrit 41.2, platelets 190,000, coags are normal.  IMPRESSION:  1. Chest pain consistent with gastroesophageal reflux disease. Exacerbated     by recent poor diet, alcohol use and stress.  2. Obesity.  3. Right upper lobe mass, old.  4. Kidney stones.  5. Family history of coronary disease.  PLAN:  The patient will be discharged home on Protonix 40 mg per day. He was instructed to avoid spicy or high fat foods. He was instructed to stop alcohol and loose weight. He was scheduled for a follow-up stress test as  an outpatient. DD:  01/04/01 TD:  01/05/01 Job: [id number]

## 2011-10-02 ENCOUNTER — Encounter (HOSPITAL_COMMUNITY): Payer: Self-pay | Admitting: Pharmacy Technician

## 2011-10-04 ENCOUNTER — Ambulatory Visit (INDEPENDENT_AMBULATORY_CARE_PROVIDER_SITE_OTHER): Payer: 59 | Admitting: Internal Medicine

## 2011-10-04 ENCOUNTER — Encounter: Payer: Self-pay | Admitting: Internal Medicine

## 2011-10-04 VITALS — BP 146/98 | HR 98 | Temp 98.0°F | Resp 12 | Ht 71.25 in | Wt 291.6 lb

## 2011-10-04 DIAGNOSIS — I10 Essential (primary) hypertension: Secondary | ICD-10-CM

## 2011-10-04 DIAGNOSIS — E538 Deficiency of other specified B group vitamins: Secondary | ICD-10-CM

## 2011-10-04 DIAGNOSIS — D696 Thrombocytopenia, unspecified: Secondary | ICD-10-CM

## 2011-10-04 DIAGNOSIS — Z01818 Encounter for other preprocedural examination: Secondary | ICD-10-CM

## 2011-10-04 MED ORDER — METOPROLOL TARTRATE 25 MG PO TABS: 25.0000 mg | ORAL_TABLET | Freq: Two times a day (BID) | ORAL | Status: DC

## 2011-10-04 NOTE — H&P (Signed)
Timothy Mckinney is an 61 y.o. male.    Chief Complaint: Infected left total knee replacement  HPI: Pt is a 61 y.o. male with a history of an arthroscopy on this knee in 2009 and total knee replacement in 2011.  He states that he has had slight pain in it for awhile, but recently while in a restaurant the knee was hip by a chair.  After getting hit, the knee pain increased in pain. He presented to the clinic where the knee was aspirated which revealed elevated white cells in the knee. Dr. Charlann Boxer was consulted for treatment of the left knee. Various options are discussed with the patient. Risks, benefits and expectations were discussed with the patient. Patient understand the risks, benefits and expectations and wishes to proceed with surgery.    PCP:  Marga Melnick, MD, MD  D/C Plans:  Home with HHPT  Post-op Meds:  Rx given for ASA, Robaxin, Celebrex, Iron, Colace and MiraLax  Tranexamic Acid:   To be given  Decadron:   Not to be given  PMH: Past Medical History  Diagnosis Date  . GERD (gastroesophageal reflux disease)   . Dry eyes, bilateral     Dr Hazle Quant  . Nephrolithiasis     Dr Aldean Ast  . Hyperuricemia   . Abnormal chest x-ray     ? granulomata    PSH: Past Surgical History  Procedure Date  . Cardiac catheterization     in 40s, negative  . Tear duct plugs   . Knee arthroscopy 2009     L  . Colonoscopy w/ polypectomy 2011    Dr Jarold Motto  . Colon ulcer 2011  . Total knee arthroplasty 2011    Workman's Comp case, Dr Ranell Patrick  . Lithotripsy     Social History:  reports that he has never smoked. He does not have any smokeless tobacco history on file. He reports that he drinks alcohol. He reports that he does not use illicit drugs.  Allergies:  Allergies  Allergen Reactions  . Penicillins Swelling    Diffuse swelling    Medications: No current facility-administered medications for this encounter.   Current Outpatient Prescriptions  Medication Sig Dispense Refill   . allopurinol (ZYLOPRIM) 300 MG tablet Take 300 mg by mouth daily.      . celecoxib (CELEBREX) 200 MG capsule Take 200 mg by mouth daily.      . cycloSPORINE (RESTASIS) 0.05 % ophthalmic emulsion Place 1 drop into both eyes daily.      . methocarbamol (ROBAXIN) 500 MG tablet Take 500 mg by mouth daily.      . metoprolol tartrate (LOPRESSOR) 25 MG tablet Take 1 tablet (25 mg total) by mouth 2 (two) times daily.  60 tablet  0  . potassium citrate (UROCIT-K) 10 MEQ (1080 MG) SR tablet Take 20 mEq by mouth 3 (three) times daily with meals.      . sodium bicarbonate 650 MG tablet Take 1,950 mg by mouth 3 (three) times daily.         ROS: Review of Systems  Constitutional: Negative.   HENT: Negative.   Eyes: Negative.   Respiratory: Negative.   Cardiovascular: Negative.   Gastrointestinal: Negative.   Genitourinary: Negative.   Musculoskeletal: Positive for joint pain.  Skin: Negative.   Neurological: Negative.   Endo/Heme/Allergies: Negative.   Psychiatric/Behavioral: Negative.      Physical Exam: Physical Exam  Constitutional: He is oriented to person, place, and time and well-developed, well-nourished, and in no  distress.  HENT:  Head: Normocephalic and atraumatic.  Nose: Nose normal.  Mouth/Throat: Oropharynx is clear and moist.  Eyes: Pupils are equal, round, and reactive to light.  Neck: Neck supple. No JVD present. No tracheal deviation present. No thyromegaly present.  Cardiovascular: Regular rhythm and intact distal pulses.  Tachycardia present.   Pulmonary/Chest: Effort normal and breath sounds normal. No stridor.  Abdominal: Soft. There is no tenderness. There is no guarding.  Musculoskeletal:       Left knee: He exhibits swelling and bony tenderness. He exhibits normal range of motion. tenderness found.  Lymphadenopathy:    He has no cervical adenopathy.  Neurological: He is alert and oriented to person, place, and time.  Skin: Skin is warm and dry.  Psychiatric:  Affect normal.     Assessment/Plan Assessment:  Infected left total knee replacement  Plan: Patient will undergo a resection of the left total knee replacement with placement of an antibiotic spacer on 10/15/2011. Risks benefits and expectation were discussed with the patient. Patient understand risks, benefits and expectation and wishes to proceed.   Anastasio Auerbach Mishon Blubaugh   PAC  10/04/2011, 10:37 PM

## 2011-10-04 NOTE — Patient Instructions (Signed)
.  Share results with Dr Nilsa Nutting office TODAY; I cannot clear you for surgery until it is verified these medical issues do not increase your perioperative risk. Blood Pressure Goal  Ideally is an AVERAGE < 135/85. This AVERAGE should be calculated from @ least 5-7 BP readings taken @ different times of day on different days of week. You should not respond to isolated BP readings , but rather the AVERAGE for that week . Make followup appointment in 5 days

## 2011-10-04 NOTE — Progress Notes (Signed)
Subjective:    Patient ID: Timothy Mckinney, male    DOB: January 29, 1951, 61 y.o.   MRN: [id number]  HPI Surgery is planned on the left knee 10/15/11 by Dr. Charlann Boxer to treat apparent infection. He has had arthroscopy on this knee in 2009   and total knee replacement in 2011. Following trauma an effusion appeared which revealed elevated white cells.  Final culture results are pending; he continues to have pain and swelling. He denies fever, chills, sweats or unexplained weight loss.  His past medical history is complicated and was reviewed in detail and updated. There was a question of an abnormal chest x-ray in 2002 but followup film in 2011 preoperatively was negative. He denies cough, sputum production, hemoptysis or dyspnea.  He had a cardiac catheterization in his 25s for chest pain; he was diagnosed as having GERD.He also denies chest pain, palpitations, edema, or claudication.  He has no history of hypertension; he believes his blood pressure was 170/105 orthopedic office last week.  He denies swallowing issues, abdominal pain or melena. With hard stools he does note intermittent bleeding. In 2011 colonoscopy apparently revealed a colonic ulcer. He had deferred followup of this due to the knee issues.  Apparently he was diagnosed as having B12 deficiency by the gastroenterologist. He took B12 for a year, but again there's been no followup.  Apparently he had labs done at an outside laboratory last week; he states that these are "all okay". Her last chart labs were 03/05/10. Calcium was low at 8.1, hematocrit 28.8 , &  platelet count 117,000.        Review of Systems He denies paroxysmal nocturnal dyspnea but does have pain in the knee which awakens him.  He has a history of elevated liver enzymes. He denies clay-colored stools or cola colored urine  He also has a history of kidney stones and has had lithotripsy x1. He denies dysuria, hematuria hematuria, or pyuria.  He denies excessive  snoring or report of apnea .     Objective:   Physical Exam  Gen.: Weight excess. Alert, appropriate and cooperative throughout exam. Eyes: No corneal or conjunctival inflammation noted.  Ears: External  ear exam reveals no significant lesions or deformities. Canals clear .TMs normal. Hearing is grossly normal bilaterally. Nose: External nasal exam reveals no deformity or inflammation. Nasal mucosa are pink and moist. No lesions or exudates noted.  Mouth: Oral mucosa and oropharynx reveal no lesions or exudates. Teeth in good repair. Neck: No deformities, masses, or tenderness noted. Thyroid normal. Lungs: Normal respiratory effort; chest expands symmetrically. Lungs are clear to auscultation without rales, wheezes, or increased work of breathing. Heart: Normal rate and rhythm. Normal S1 and S2. No gallop, click, or rub. S4 w/o murmur. Abdomen: Bowel sounds normal; abdomen soft and nontender. No masses, organomegaly or hernias noted.Striae Genitalia: [facility] Musculoskeletal/extremities: No deformity or scoliosis noted of  the thoracic or lumbar spine. No clubbing, cyanosis, edema noted. Range of motion  decreased at the L knee with effusion and crepitus.Wendy Poet & strength  normal. Nail health  good. Vascular: Carotid, radial artery, dorsalis pedis and  posterior tibial pulses are full and equal. No bruits present. Neurologic: Alert and oriented x3. Deep tendon reflexes symmetrical and normal ( L knee not checked). Skin: Intact without suspicious lesions or rashes. Lymph: No cervical, axillary  lymphadenopathy present. Psych: Mood and affect are normal. Normally interactive  Assessment & Plan:  #1 possible pyogenic effusion left knee; surgery is imperative  #2 uncontrolled hypertension, newly diagnosed. EKG reveals only incomplete right bundle branch block without hypertensive or ischemic  changes.  #3 history of B12 deficiency with no repletion for well over 12 months  #4 prior anemia  #5 weight excess; possible metabolic syndrome and prediabetes  #6 has history of elevated liver enzymes, questionable status  #7 thrombocytopenia, past medical history  Plan: Beta blocker will be initiated.  Labs from Orthopedic office must be reviewed. Additional studies will probably be necessary. It is anticipated that he'll need a B12 level, lipids and A1c  @ a minimum

## 2011-10-08 ENCOUNTER — Encounter (HOSPITAL_COMMUNITY)
Admission: RE | Admit: 2011-10-08 | Discharge: 2011-10-08 | Disposition: A | Discharge status: Home / Self Care | Payer: 59 | Source: Ambulatory Visit | Attending: Orthopedic Surgery | Admitting: Orthopedic Surgery

## 2011-10-08 ENCOUNTER — Encounter (HOSPITAL_COMMUNITY): Payer: Self-pay

## 2011-10-08 ENCOUNTER — Ambulatory Visit (HOSPITAL_COMMUNITY)
Admission: RE | Admit: 2011-10-08 | Discharge: 2011-10-08 | Disposition: A | Discharge status: Home / Self Care | Payer: 59 | Source: Ambulatory Visit | Attending: Orthopedic Surgery | Admitting: Orthopedic Surgery

## 2011-10-08 DIAGNOSIS — Z01818 Encounter for other preprocedural examination: Secondary | ICD-10-CM | POA: Insufficient documentation

## 2011-10-08 DIAGNOSIS — Z01812 Encounter for preprocedural laboratory examination: Secondary | ICD-10-CM | POA: Insufficient documentation

## 2011-10-08 HISTORY — DX: Abnormal levels of other serum enzymes: R74.8

## 2011-10-08 HISTORY — DX: Sleep apnea, unspecified: G47.30

## 2011-10-08 HISTORY — DX: Essential (primary) hypertension: I10

## 2011-10-08 HISTORY — DX: Thrombocytopenia, unspecified: D69.6

## 2011-10-08 LAB — DIFFERENTIAL
Basophils Absolute: 0 10*3/uL (ref 0.0–0.1)
Eosinophils Absolute: 0.3 10*3/uL (ref 0.0–0.7)
Eosinophils Relative: 4 % (ref 0–5)
Lymphocytes Relative: 23 % (ref 12–46)
Neutrophils Relative %: 62 % (ref 43–77)

## 2011-10-08 LAB — URINALYSIS, ROUTINE W REFLEX MICROSCOPIC
Hgb urine dipstick: NEGATIVE
Protein, ur: NEGATIVE mg/dL
Urobilinogen, UA: 1 mg/dL (ref 0.0–1.0)

## 2011-10-08 LAB — BASIC METABOLIC PANEL
CO2: 26 mEq/L (ref 19–32)
Calcium: 9.7 mg/dL (ref 8.4–10.5)
Creatinine, Ser: 0.9 mg/dL (ref 0.50–1.35)
GFR calc non Af Amer: 89 mL/min — ABNORMAL LOW (ref >90)
Sodium: 135 mEq/L (ref 135–145)

## 2011-10-08 LAB — CBC
MCV: 99.1 fL (ref 78.0–100.0)
Platelets: 193 10*3/uL (ref 150–400)
RDW: 13.5 % (ref 11.5–15.5)
WBC: 7.1 10*3/uL (ref 4.0–10.5)

## 2011-10-08 LAB — PROTIME-INR
INR: 1 (ref 0.00–1.49)
Prothrombin Time: 13.4 seconds (ref 11.6–15.2)

## 2011-10-08 LAB — SURGICAL PCR SCREEN
MRSA, PCR: NEGATIVE
Staphylococcus aureus: NEGATIVE

## 2011-10-08 LAB — APTT: aPTT: 35 seconds (ref 24–37)

## 2011-10-08 NOTE — Progress Notes (Signed)
10/08/11 1646  OBSTRUCTIVE SLEEP APNEA  Have you ever been diagnosed with sleep apnea through a sleep study? No  Do you snore loudly (loud enough to be heard through closed doors)?  0  Do you often feel tired, fatigued, or sleepy during the daytime? 0  Has anyone observed you stop breathing during your sleep? 0  Do you have, or are you being treated for high blood pressure? 1  BMI more than 35 kg/m2? 1 (BMI = 39.4)  Age over 63 years old? 1  Neck circumference greater than 40 cm/18 inches? 0 (18")  Gender: 1  Obstructive Sleep Apnea Score 4   Score 4 or greater  Updated health history

## 2011-10-08 NOTE — Patient Instructions (Addendum)
YOUR SURGERY IS SCHEDULED ON:  Tuesday  4/16  AT 2:00 PM  REPORT TO [facility] AT:  11:30 AM      PHONE # FOR SHORT STAY IS [phone]  DO NOT EAT ANYTHING AFTER MIDNIGHT THE NIGHT BEFORE YOUR SURGERY.  YOU MAY BRUSH YOUR TEETH-- NO FOOD, NO CHEWING GUM, NO MINTS, NO CANDIES, NO CHEWING TOBACCO.  YOU MAY HAVE CLEAR LIQUIDS TO DRINK FROM MIDNIGHT UNTIL 8:00 AM THE DAY OF YOUR SURGERY--SUCH AS WATER OR GINGER ALE OR SODA--NOTHING TO DRINK AFTER 8:00AM THE DAY OF YOUR SURGERY.  PLEASE TAKE THE FOLLOWING MEDICATIONS THE AM OF YOUR SURGERY:  METOPROLOL AND ALLOPURINOL.  MAY USE RESTASIS EYE DROPS--BRING EYE DROPS TO HOSPITAL.    IF YOU USE INHALERS--USE YOUR INHALERS THE AM OF YOUR SURGERY AND BRING INHALERS TO THE HOSPITAL -TAKE TO SURGERY.    IF YOU ARE DIABETIC:  DO NOT TAKE ANY DIABETIC MEDICATIONS THE AM OF YOUR SURGERY.  IF YOU TAKE INSULIN IN THE EVENINGS--PLEASE ONLY TAKE 1/2 NORMAL EVENING DOSE THE NIGHT BEFORE YOUR SURGERY.  NO INSULIN THE AM OF YOUR SURGERY.  IF YOU HAVE SLEEP APNEA AND USE CPAP OR BIPAP--PLEASE BRING THE MASK --NOT THE MACHINE-NOT THE TUBING   -JUST THE MASK. DO NOT BRING VALUABLES, MONEY, CREDIT CARDS.  CONTACT LENS, DENTURES / PARTIALS, GLASSES SHOULD NOT BE WORN TO SURGERY AND IN MOST CASES-HEARING AIDS WILL NEED TO BE REMOVED.  BRING YOUR GLASSES CASE, ANY EQUIPMENT NEEDED FOR YOUR CONTACT LENS. FOR PATIENTS ADMITTED TO THE HOSPITAL--CHECK OUT TIME THE DAY OF DISCHARGE IS 11:00 AM.  ALL INPATIENT ROOMS ARE PRIVATE - WITH BATHROOM, TELEPHONE, TELEVISION AND WIFI INTERNET. IF YOU ARE BEING DISCHARGED THE SAME DAY OF YOUR SURGERY--YOU CAN NOT DRIVE YOURSELF HOME--AND SHOULD NOT GO HOME ALONE BY TAXI OR BUS.  NO DRIVING OR OPERATING MACHINERY FOR 24 HOURS FOLLOWING ANESTHESIA / PAIN MEDICATIONS.                            SPECIAL INSTRUCTIONS:  CHLORHEXIDINE SOAP SHOWER (other brand names are Betasept and Hibiclens ) PLEASE SHOWER WITH CHLORHEXIDINE  THE NIGHT BEFORE YOUR SURGERY AND THE AM OF YOUR SURGERY. DO NOT USE CHLORHEXIDINE ON YOUR FACE OR PRIVATE AREAS--YOU MAY USE YOUR NORMAL SOAP THOSE AREAS AND YOUR NORMAL SHAMPOO.  WOMEN SHOULD AVOID SHAVING UNDER ARMS AND SHAVING LEGS 48 HOURS BEFORE USING CHLORHEXIDINE TO AVOID SKIN IRRITATION.  DO NOT USE IF ALLERGIC TO CHLORHEXIDINE.  PLEASE READ OVER ANY  FACT SHEETS THAT YOU WERE GIVEN: MRSA INFORMATION, BLOOD TRANSFUSION INFORMATION, INCENTIVE SPIROMETER INFORMATION.   10/14/11 PT NOTIFIED HIS SURGERY TIME HAS  CHANGED TO 10:15 AM AND HE NEEDS TO ARRIVE TO SHORT STAY BY 7:45 AM AND NOTHING TO EAT OR DRINK AFTER MIDNIGHT--MAY NOT HAVE CLEAR LIQUIDS SINCE TIME MOVED UP--MAY HAVE SIP WATER TO TAKE MEDS.

## 2011-10-08 NOTE — Pre-Procedure Instructions (Signed)
PT HAS EKG REPORT FROM DR. HOPPER-DONE 10/04/11-REPORT IN EPIC AND COPY ON PT'S CHART. CXR, CBC WITH DIFF, BMET, PT, PTT, UA WERE DONE TODAY AT [facility] PREOP AND T/S WILL BE DRAWN DAY OF SURGERY.

## 2011-10-09 ENCOUNTER — Encounter: Payer: Self-pay | Admitting: Internal Medicine

## 2011-10-09 ENCOUNTER — Ambulatory Visit (INDEPENDENT_AMBULATORY_CARE_PROVIDER_SITE_OTHER): Payer: 59 | Admitting: Internal Medicine

## 2011-10-09 ENCOUNTER — Other Ambulatory Visit (INDEPENDENT_AMBULATORY_CARE_PROVIDER_SITE_OTHER): Payer: 59

## 2011-10-09 VITALS — BP 120/78 | HR 72 | Temp 98.1°F

## 2011-10-09 DIAGNOSIS — R21 Rash and other nonspecific skin eruption: Secondary | ICD-10-CM

## 2011-10-09 DIAGNOSIS — E8881 Metabolic syndrome: Secondary | ICD-10-CM

## 2011-10-09 DIAGNOSIS — Z01818 Encounter for other preprocedural examination: Secondary | ICD-10-CM

## 2011-10-09 DIAGNOSIS — I10 Essential (primary) hypertension: Secondary | ICD-10-CM

## 2011-10-09 LAB — LIPID PANEL
LDL Cholesterol: 116 mg/dL — ABNORMAL HIGH (ref 0–99)
Total CHOL/HDL Ratio: 3
VLDL: 16.4 mg/dL (ref 0.0–40.0)

## 2011-10-09 LAB — BASIC METABOLIC PANEL
BUN: 20 mg/dL (ref 6–23)
Chloride: 102 mEq/L (ref 96–112)
Glucose, Bld: 106 mg/dL — ABNORMAL HIGH (ref 70–99)
Potassium: 4.6 mEq/L (ref 3.5–5.1)

## 2011-10-09 LAB — HEPATIC FUNCTION PANEL
Bilirubin, Direct: 0.1 mg/dL (ref 0.0–0.3)
Total Bilirubin: 0.6 mg/dL (ref 0.3–1.2)
Total Protein: 7.3 g/dL (ref 6.0–8.3)

## 2011-10-09 MED ORDER — METOPROLOL TARTRATE 25 MG PO TABS: 25.0000 mg | ORAL_TABLET | Freq: Two times a day (BID) | ORAL | Status: DC

## 2011-10-09 MED ORDER — METRONIDAZOLE 0.75 % EX GEL: Freq: Two times a day (BID) | CUTANEOUS | Status: DC

## 2011-10-09 NOTE — Patient Instructions (Signed)
Blood Pressure Goal  Ideally is an AVERAGE < 135/85. This AVERAGE should be calculated from @ least 5-7 BP readings taken @ different times of day on different days of week. You should not respond to isolated BP readings , but rather the AVERAGE for that week  Please try to go on My Chart within the next 24 hours to allow me to release the results directly to you.  

## 2011-10-09 NOTE — Progress Notes (Signed)
  Subjective:    Patient ID: Timothy Mckinney, male    DOB: June 15, 1951, 61 y.o.   MRN: [id number]  HPI He is on metoprolol 25 mg twice a day; has been a steady improvement in his blood pressure. His diary was reviewed and blood pressure is at goal of average less than 145/85. He denies chest pain, palpitations, dyspnea, or claudication.    Review of Systems At the hospital for preop; it was mentioned that he had a rash over his cheeks. He has had some itching within the last week. He denies significant itchy eyes, sneezing, or wheezing.  He has not changed any of his cosmetic or shaving perhaps. He did take an antihistamine for itching       core  Objective:   Physical Exam He is in no acute distress  No carotid bruits are present.  Heart rhythm and rate are normal with no significant murmurs or gallops.  Chest is clear with no increased work of breathing    He has no clubbing or edema.   Pedal pulses are intact . Prominent varicosities are noted over the legs, particularly the right lower extremity.  No ischemic skin changes are present.He has scattered mildly erythematous small ,flat  papules over the cheeks and over the scalp. There are no lesions noted on the forearms and legs or other sun exposed areas.         Assessment & Plan:  #1 hypertension, well-controlled. His pharmacist has suggested possible extended release beta blocker at a dose of 50 mg daily. If this is cost effective to change to be made at the next prescription refill.  #2 rash; clinically rosacea suggested  Plan: He is cleared for the surgery. Copies of his pending labs will be released to him. Metronidazole gel will be prescribed

## 2011-10-10 ENCOUNTER — Telehealth: Payer: Self-pay | Admitting: Internal Medicine

## 2011-10-10 NOTE — Telephone Encounter (Signed)
Karen wt Dr. Charlann Boxer, needs to know if this patient was cleared for surgical clearance when he was in yesterday. Please review & advise  PH for Dr.Olin [phone]

## 2011-10-10 NOTE — Telephone Encounter (Signed)
Left message on voicemail, Informed Timothy Mckinney patient cleared. Copy of OV faxed to Timothy Mckinney @ [phone], labs to be faxed when resulted back

## 2011-10-15 ENCOUNTER — Inpatient Hospital Stay (HOSPITAL_COMMUNITY)
Admission: RE | Admit: 2011-10-15 | Discharge: 2011-10-17 | DRG: 465 | Disposition: A | Discharge status: Home Health | Payer: 59 | Source: Ambulatory Visit | Attending: Orthopedic Surgery | Admitting: Orthopedic Surgery

## 2011-10-15 ENCOUNTER — Encounter (HOSPITAL_COMMUNITY): Payer: Self-pay | Admitting: Anesthesiology

## 2011-10-15 ENCOUNTER — Encounter (HOSPITAL_COMMUNITY): Payer: Self-pay | Admitting: *Deleted

## 2011-10-15 ENCOUNTER — Ambulatory Visit (HOSPITAL_COMMUNITY): Payer: 59 | Admitting: Anesthesiology

## 2011-10-15 ENCOUNTER — Encounter (HOSPITAL_COMMUNITY)
Admission: RE | Disposition: A | Discharge status: Home Health | Payer: Self-pay | Source: Ambulatory Visit | Attending: Orthopedic Surgery

## 2011-10-15 DIAGNOSIS — R7989 Other specified abnormal findings of blood chemistry: Secondary | ICD-10-CM | POA: Diagnosis present

## 2011-10-15 DIAGNOSIS — Z89529 Acquired absence of unspecified knee: Secondary | ICD-10-CM

## 2011-10-15 DIAGNOSIS — Y831 Surgical operation with implant of artificial internal device as the cause of abnormal reaction of the patient, or of later complication, without mention of misadventure at the time of the procedure: Secondary | ICD-10-CM | POA: Diagnosis present

## 2011-10-15 DIAGNOSIS — Z96659 Presence of unspecified artificial knee joint: Secondary | ICD-10-CM

## 2011-10-15 DIAGNOSIS — H04129 Dry eye syndrome of unspecified lacrimal gland: Secondary | ICD-10-CM | POA: Diagnosis present

## 2011-10-15 DIAGNOSIS — I10 Essential (primary) hypertension: Secondary | ICD-10-CM

## 2011-10-15 DIAGNOSIS — B999 Unspecified infectious disease: Secondary | ICD-10-CM | POA: Diagnosis present

## 2011-10-15 DIAGNOSIS — T8450XA Infection and inflammatory reaction due to unspecified internal joint prosthesis, initial encounter: Principal | ICD-10-CM | POA: Diagnosis present

## 2011-10-15 DIAGNOSIS — R21 Rash and other nonspecific skin eruption: Secondary | ICD-10-CM

## 2011-10-15 DIAGNOSIS — Z87442 Personal history of urinary calculi: Secondary | ICD-10-CM

## 2011-10-15 LAB — GRAM STAIN

## 2011-10-15 LAB — TYPE AND SCREEN: ABO/RH(D): O POS

## 2011-10-15 SURGERY — REMOVAL, TOTAL ARTHROPLASTY HARDWARE, KNEE, WITH ANTIBIOTIC SPACER INSERTION
Anesthesia: General | Site: Knee | Laterality: Left | Wound class: Dirty or Infected

## 2011-10-15 MED ORDER — CHLORHEXIDINE GLUCONATE 4 % EX LIQD: 60.0000 mL | Freq: Once | CUTANEOUS | Status: DC

## 2011-10-15 MED ORDER — SODIUM CHLORIDE 0.9 % IR SOLN
Status: DC | PRN
  Administered 2011-10-15: 6000 mL

## 2011-10-15 MED ORDER — SODIUM BICARBONATE 650 MG PO TABS
1950.0000 mg | ORAL_TABLET | Freq: Three times a day (TID) | ORAL | Status: DC
  Filled 2011-10-15 (×7): qty 3

## 2011-10-15 MED ORDER — HYDROMORPHONE HCL PF 1 MG/ML IJ SOLN
INTRAMUSCULAR | Status: AC
  Filled 2011-10-15: qty 1

## 2011-10-15 MED ORDER — TOBRAMYCIN SULFATE 1.2 G IJ SOLR
INTRAMUSCULAR | Status: AC
  Filled 2011-10-15: qty 2.4

## 2011-10-15 MED ORDER — METHOCARBAMOL 500 MG PO TABS: 500.0000 mg | ORAL_TABLET | Freq: Four times a day (QID) | ORAL | Status: DC | PRN

## 2011-10-15 MED ORDER — FLEET ENEMA 7-19 GM/118ML RE ENEM: 1.0000 | ENEMA | Freq: Once | RECTAL | Status: AC | PRN

## 2011-10-15 MED ORDER — FENTANYL CITRATE 0.05 MG/ML IJ SOLN
INTRAMUSCULAR | Status: AC
  Filled 2011-10-15: qty 2

## 2011-10-15 MED ORDER — BUPIVACAINE-EPINEPHRINE 0.25% -1:200000 IJ SOLN
INTRAMUSCULAR | Status: AC
  Filled 2011-10-15: qty 1

## 2011-10-15 MED ORDER — HYDROMORPHONE HCL PF 1 MG/ML IJ SOLN
INTRAMUSCULAR | Status: DC | PRN
  Administered 2011-10-15 (×2): 1 mg via INTRAVENOUS

## 2011-10-15 MED ORDER — VANCOMYCIN HCL 1000 MG IV SOLR
INTRAVENOUS | Status: AC
  Filled 2011-10-15: qty 1000

## 2011-10-15 MED ORDER — FERROUS SULFATE 325 (65 FE) MG PO TABS
325.0000 mg | ORAL_TABLET | Freq: Three times a day (TID) | ORAL | Status: DC
  Administered 2011-10-15 – 2011-10-17 (×5): 325 mg via ORAL
  Filled 2011-10-15 (×7): qty 1

## 2011-10-15 MED ORDER — SODIUM CHLORIDE 0.9 % IV SOLN
INTRAVENOUS | Status: DC
  Administered 2011-10-16 – 2011-10-17 (×3): via INTRAVENOUS
  Filled 2011-10-15 (×11): qty 1000

## 2011-10-15 MED ORDER — TOBRAMYCIN POWD
Status: DC | PRN
  Administered 2011-10-15: 8400 mg

## 2011-10-15 MED ORDER — VANCOMYCIN HCL 1000 MG IV SOLR
INTRAVENOUS | Status: AC
  Filled 2011-10-15: qty 4000

## 2011-10-15 MED ORDER — ONDANSETRON HCL 4 MG PO TABS: 4.0000 mg | ORAL_TABLET | Freq: Four times a day (QID) | ORAL | Status: DC | PRN

## 2011-10-15 MED ORDER — ONDANSETRON HCL 4 MG/2ML IJ SOLN
INTRAMUSCULAR | Status: DC | PRN
  Administered 2011-10-15: 4 mg via INTRAVENOUS

## 2011-10-15 MED ORDER — RIVAROXABAN 10 MG PO TABS
10.0000 mg | ORAL_TABLET | ORAL | Status: DC
  Administered 2011-10-16 – 2011-10-17 (×2): 10 mg via ORAL
  Filled 2011-10-15 (×2): qty 1

## 2011-10-15 MED ORDER — POLYETHYLENE GLYCOL 3350 17 G PO PACK
17.0000 g | PACK | Freq: Two times a day (BID) | ORAL | Status: DC
  Administered 2011-10-16 (×2): 17 g via ORAL
  Filled 2011-10-15 (×5): qty 1

## 2011-10-15 MED ORDER — ZOLPIDEM TARTRATE 5 MG PO TABS
5.0000 mg | ORAL_TABLET | Freq: Every evening | ORAL | Status: DC | PRN
  Administered 2011-10-15: 5 mg via ORAL
  Filled 2011-10-15: qty 1

## 2011-10-15 MED ORDER — HYDROMORPHONE HCL PF 1 MG/ML IJ SOLN
0.2500 mg | INTRAMUSCULAR | Status: DC | PRN
  Administered 2011-10-15 (×4): 0.5 mg via INTRAVENOUS

## 2011-10-15 MED ORDER — MIDAZOLAM HCL 5 MG/5ML IJ SOLN
INTRAMUSCULAR | Status: DC | PRN
  Administered 2011-10-15: 2 mg via INTRAVENOUS

## 2011-10-15 MED ORDER — HYDROCODONE-ACETAMINOPHEN 7.5-325 MG PO TABS
1.0000 | ORAL_TABLET | ORAL | Status: DC
  Administered 2011-10-15: 2 via ORAL
  Administered 2011-10-15: 1 via ORAL
  Administered 2011-10-15 – 2011-10-16 (×5): 2 via ORAL
  Administered 2011-10-16: 1 via ORAL
  Administered 2011-10-17 (×4): 2 via ORAL
  Filled 2011-10-15 (×6): qty 2
  Filled 2011-10-15: qty 1
  Filled 2011-10-15: qty 2
  Filled 2011-10-15: qty 1
  Filled 2011-10-15 (×4): qty 2

## 2011-10-15 MED ORDER — FENTANYL CITRATE 0.05 MG/ML IJ SOLN
50.0000 ug | INTRAMUSCULAR | Status: DC | PRN
  Administered 2011-10-15 (×2): 50 ug via INTRAVENOUS

## 2011-10-15 MED ORDER — CELECOXIB 200 MG PO CAPS
200.0000 mg | ORAL_CAPSULE | Freq: Every day | ORAL | Status: DC
  Administered 2011-10-15 – 2011-10-17 (×3): 200 mg via ORAL
  Filled 2011-10-15 (×3): qty 1

## 2011-10-15 MED ORDER — ALUM & MAG HYDROXIDE-SIMETH 200-200-20 MG/5ML PO SUSP: 30.0000 mL | ORAL | Status: DC | PRN

## 2011-10-15 MED ORDER — PHENOL 1.4 % MT LIQD
1.0000 | OROMUCOSAL | Status: DC | PRN
  Filled 2011-10-15: qty 177

## 2011-10-15 MED ORDER — HYDROMORPHONE HCL PF 1 MG/ML IJ SOLN
0.5000 mg | INTRAMUSCULAR | Status: DC | PRN
  Administered 2011-10-15: 1 mg via INTRAVENOUS
  Administered 2011-10-15: 2 mg via INTRAVENOUS
  Administered 2011-10-16: 1 mg via INTRAVENOUS
  Administered 2011-10-16: 2 mg via INTRAVENOUS
  Administered 2011-10-16: 1 mg via INTRAVENOUS
  Administered 2011-10-16: 2 mg via INTRAVENOUS
  Filled 2011-10-15: qty 1
  Filled 2011-10-15: qty 2
  Filled 2011-10-15: qty 1
  Filled 2011-10-15: qty 2
  Filled 2011-10-15: qty 1
  Filled 2011-10-15: qty 2

## 2011-10-15 MED ORDER — VANCOMYCIN HCL POWD
Status: DC | PRN
  Administered 2011-10-15: 8000 mg

## 2011-10-15 MED ORDER — TRANEXAMIC ACID 100 MG/ML IV SOLN
15.0000 mg/kg | Freq: Once | INTRAVENOUS | Status: AC
  Administered 2011-10-15: 1972.5 mg via INTRAVENOUS
  Filled 2011-10-15: qty 19.73

## 2011-10-15 MED ORDER — ALLOPURINOL 300 MG PO TABS
300.0000 mg | ORAL_TABLET | Freq: Every day | ORAL | Status: DC
  Administered 2011-10-16 – 2011-10-17 (×2): 300 mg via ORAL
  Filled 2011-10-15 (×2): qty 1

## 2011-10-15 MED ORDER — DIPHENHYDRAMINE HCL 25 MG PO CAPS: 25.0000 mg | ORAL_CAPSULE | Freq: Four times a day (QID) | ORAL | Status: DC | PRN

## 2011-10-15 MED ORDER — ONDANSETRON HCL 4 MG/2ML IJ SOLN: 4.0000 mg | Freq: Four times a day (QID) | INTRAMUSCULAR | Status: DC | PRN

## 2011-10-15 MED ORDER — VANCOMYCIN HCL 1000 MG IV SOLR
1250.0000 mg | Freq: Two times a day (BID) | INTRAVENOUS | Status: DC
  Administered 2011-10-16 – 2011-10-17 (×3): 1250 mg via INTRAVENOUS
  Filled 2011-10-15 (×5): qty 1250

## 2011-10-15 MED ORDER — VANCOMYCIN HCL IN DEXTROSE 1-5 GM/200ML-% IV SOLN
INTRAVENOUS | Status: AC
  Filled 2011-10-15: qty 200

## 2011-10-15 MED ORDER — TOBRAMYCIN SULFATE 1.2 G IJ SOLR
INTRAMUSCULAR | Status: AC
  Filled 2011-10-15: qty 4.8

## 2011-10-15 MED ORDER — VANCOMYCIN HCL 1000 MG IV SOLR
2000.0000 mg | Freq: Once | INTRAVENOUS | Status: AC
  Administered 2011-10-15: 2000 mg via INTRAVENOUS
  Filled 2011-10-15: qty 2000

## 2011-10-15 MED ORDER — SUCCINYLCHOLINE CHLORIDE 20 MG/ML IJ SOLN
INTRAMUSCULAR | Status: DC | PRN
  Administered 2011-10-15: 100 mg via INTRAVENOUS

## 2011-10-15 MED ORDER — KETOROLAC TROMETHAMINE 30 MG/ML IJ SOLN
INTRAMUSCULAR | Status: AC
  Filled 2011-10-15: qty 1

## 2011-10-15 MED ORDER — LACTATED RINGERS IV SOLN
INTRAVENOUS | Status: DC
  Administered 2011-10-15: 15:00:00 via INTRAVENOUS

## 2011-10-15 MED ORDER — METRONIDAZOLE 0.75 % EX GEL
Freq: Two times a day (BID) | CUTANEOUS | Status: DC
  Administered 2011-10-15 – 2011-10-16 (×2): via TOPICAL
  Filled 2011-10-15: qty 45

## 2011-10-15 MED ORDER — POTASSIUM CITRATE ER 10 MEQ (1080 MG) PO TBCR
20.0000 meq | EXTENDED_RELEASE_TABLET | Freq: Three times a day (TID) | ORAL | Status: DC
  Filled 2011-10-15 (×6): qty 2

## 2011-10-15 MED ORDER — GLYCOPYRROLATE 0.2 MG/ML IJ SOLN
INTRAMUSCULAR | Status: DC | PRN
  Administered 2011-10-15: .7 mg via INTRAVENOUS

## 2011-10-15 MED ORDER — METOCLOPRAMIDE HCL 5 MG/ML IJ SOLN: 5.0000 mg | Freq: Three times a day (TID) | INTRAMUSCULAR | Status: DC | PRN

## 2011-10-15 MED ORDER — BISACODYL 5 MG PO TBEC: 5.0000 mg | DELAYED_RELEASE_TABLET | Freq: Every day | ORAL | Status: DC | PRN

## 2011-10-15 MED ORDER — VANCOMYCIN HCL 1000 MG IV SOLR
1000.0000 mg | INTRAVENOUS | Status: AC
  Administered 2011-10-15: 1000 mg
  Filled 2011-10-15 (×2): qty 1000

## 2011-10-15 MED ORDER — CYCLOSPORINE 0.05 % OP EMUL
1.0000 [drp] | Freq: Every day | OPHTHALMIC | Status: DC
  Administered 2011-10-16 – 2011-10-17 (×2): 1 [drp] via OPHTHALMIC
  Filled 2011-10-15 (×2): qty 1

## 2011-10-15 MED ORDER — METOCLOPRAMIDE HCL 10 MG PO TABS: 5.0000 mg | ORAL_TABLET | Freq: Three times a day (TID) | ORAL | Status: DC | PRN

## 2011-10-15 MED ORDER — 0.9 % SODIUM CHLORIDE (POUR BTL) OPTIME
TOPICAL | Status: DC | PRN
  Administered 2011-10-15: 1000 mL

## 2011-10-15 MED ORDER — DOCUSATE SODIUM 100 MG PO CAPS
100.0000 mg | ORAL_CAPSULE | Freq: Two times a day (BID) | ORAL | Status: DC
  Administered 2011-10-16 – 2011-10-17 (×3): 100 mg via ORAL
  Filled 2011-10-15 (×5): qty 1

## 2011-10-15 MED ORDER — FENTANYL CITRATE 0.05 MG/ML IJ SOLN
INTRAMUSCULAR | Status: DC | PRN
  Administered 2011-10-15: 50 ug via INTRAVENOUS
  Administered 2011-10-15: 100 ug via INTRAVENOUS
  Administered 2011-10-15 (×7): 50 ug via INTRAVENOUS

## 2011-10-15 MED ORDER — PROPOFOL 10 MG/ML IV BOLUS
INTRAVENOUS | Status: DC | PRN
  Administered 2011-10-15: 200 mg via INTRAVENOUS

## 2011-10-15 MED ORDER — NEOSTIGMINE METHYLSULFATE 1 MG/ML IJ SOLN
INTRAMUSCULAR | Status: DC | PRN
  Administered 2011-10-15: 4 mg via INTRAVENOUS

## 2011-10-15 MED ORDER — METHOCARBAMOL 100 MG/ML IJ SOLN
500.0000 mg | Freq: Four times a day (QID) | INTRAMUSCULAR | Status: DC | PRN
  Administered 2011-10-15: 500 mg via INTRAVENOUS
  Filled 2011-10-15: qty 5

## 2011-10-15 MED ORDER — ROCURONIUM BROMIDE 100 MG/10ML IV SOLN
INTRAVENOUS | Status: DC | PRN
  Administered 2011-10-15: 50 mg via INTRAVENOUS

## 2011-10-15 MED ORDER — LACTATED RINGERS IV SOLN
INTRAVENOUS | Status: DC | PRN
  Administered 2011-10-15 (×2): via INTRAVENOUS

## 2011-10-15 MED ORDER — MENTHOL 3 MG MT LOZG
1.0000 | LOZENGE | OROMUCOSAL | Status: DC | PRN
  Filled 2011-10-15: qty 9

## 2011-10-15 MED ORDER — LIDOCAINE HCL (CARDIAC) 20 MG/ML IV SOLN
INTRAVENOUS | Status: DC | PRN
  Administered 2011-10-15: 50 mg via INTRAVENOUS

## 2011-10-15 MED ORDER — TOBRAMYCIN SULFATE 1.2 G IJ SOLR
1.2000 g | INTRAMUSCULAR | Status: DC
  Filled 2011-10-15: qty 1.2

## 2011-10-15 MED ORDER — METOPROLOL TARTRATE 25 MG PO TABS
25.0000 mg | ORAL_TABLET | Freq: Two times a day (BID) | ORAL | Status: DC
  Administered 2011-10-15 – 2011-10-17 (×4): 25 mg via ORAL
  Filled 2011-10-15 (×5): qty 1

## 2011-10-15 MED FILL — Tobramycin Sulfate For Inj 1.2 GM: INTRAMUSCULAR | Qty: 8.4 | Status: AC

## 2011-10-15 MED FILL — Vancomycin HCl For IV Soln 1 GM (Base Equivalent): INTRAVENOUS | Qty: 7000 | Status: AC

## 2011-10-15 SURGICAL SUPPLY — 74 items
ADH SKN CLS APL DERMABOND .7 (GAUZE/BANDAGES/DRESSINGS) ×1
BAG SPEC THK2 15X12 ZIP CLS (MISCELLANEOUS) ×1
BAG ZIPLOCK 12X15 (MISCELLANEOUS) ×2 IMPLANT
BANDAGE ELASTIC 6 VELCRO ST LF (GAUZE/BANDAGES/DRESSINGS) ×2 IMPLANT
BANDAGE ESMARK 6X9 LF (GAUZE/BANDAGES/DRESSINGS) ×1 IMPLANT
BIT DRILL 2.8X128 (BIT) ×1 IMPLANT
BLADE SAW SGTL 13.0X1.19X90.0M (BLADE) ×2 IMPLANT
BLADE SAW SGTL 81X20 HD (BLADE) ×2 IMPLANT
BNDG CMPR 9X6 STRL LF SNTH (GAUZE/BANDAGES/DRESSINGS) ×1
BNDG ESMARK 6X9 LF (GAUZE/BANDAGES/DRESSINGS) ×2
BOWL SMART MIX CTS (DISPOSABLE) IMPLANT
BRUSH FEMORAL CANAL (MISCELLANEOUS) ×1 IMPLANT
CEMENT HV SMART SET (Cement) ×7 IMPLANT
CLOTH BEACON ORANGE TIMEOUT ST (SAFETY) ×2 IMPLANT
CUFF TOURN SGL QUICK 34 (TOURNIQUET CUFF) ×2
CUFF TRNQT CYL 34X4X40X1 (TOURNIQUET CUFF) ×1 IMPLANT
DERMABOND ADVANCED (GAUZE/BANDAGES/DRESSINGS) ×1
DERMABOND ADVANCED .7 DNX12 (GAUZE/BANDAGES/DRESSINGS) ×1 IMPLANT
DRAPE EXTREMITY T 121X128X90 (DRAPE) ×2 IMPLANT
DRAPE POUCH INSTRU U-SHP 10X18 (DRAPES) ×2 IMPLANT
DRAPE U-SHAPE 47X51 STRL (DRAPES) ×2 IMPLANT
DRSG ADAPTIC 3X8 NADH LF (GAUZE/BANDAGES/DRESSINGS) ×2 IMPLANT
DRSG AQUACEL AG ADV 3.5X10 (GAUZE/BANDAGES/DRESSINGS) ×2 IMPLANT
DRSG PAD ABDOMINAL 8X10 ST (GAUZE/BANDAGES/DRESSINGS) ×2 IMPLANT
DRSG TEGADERM 4X4.75 (GAUZE/BANDAGES/DRESSINGS) ×2 IMPLANT
DURAPREP 26ML APPLICATOR (WOUND CARE) ×2 IMPLANT
ELECT REM PT RETURN 9FT ADLT (ELECTROSURGICAL) ×2
ELECTRODE REM PT RTRN 9FT ADLT (ELECTROSURGICAL) ×1 IMPLANT
EVACUATOR 1/8 PVC DRAIN (DRAIN) ×3 IMPLANT
FACESHIELD LNG OPTICON STERILE (SAFETY) ×10 IMPLANT
GAUZE SPONGE 2X2 8PLY STRL LF (GAUZE/BANDAGES/DRESSINGS) ×1 IMPLANT
GLOVE BIOGEL PI IND STRL 7.5 (GLOVE) ×1 IMPLANT
GLOVE BIOGEL PI IND STRL 8 (GLOVE) ×2 IMPLANT
GLOVE BIOGEL PI INDICATOR 7.5 (GLOVE) ×1
GLOVE BIOGEL PI INDICATOR 8 (GLOVE) ×2
GLOVE ORTHO TXT STRL SZ7.5 (GLOVE) ×4 IMPLANT
GLOVE SURG ORTHO 8.0 STRL STRW (GLOVE) ×2 IMPLANT
GOWN BRE IMP PREV XXLGXLNG (GOWN DISPOSABLE) ×4 IMPLANT
GOWN STRL NON-REIN LRG LVL3 (GOWN DISPOSABLE) ×2 IMPLANT
HANDPIECE INTERPULSE COAX TIP (DISPOSABLE) ×2
IMMOBILIZER KNEE 20 (SOFTGOODS) ×2
IMMOBILIZER KNEE 20 THIGH 36 (SOFTGOODS) IMPLANT
KIT BASIN OR (CUSTOM PROCEDURE TRAY) ×2 IMPLANT
KIT OPTIVAC TOT HIP 120G TRIPL (KITS) ×1 IMPLANT
KIT STIMULAN RAPID CURE  10CC (Orthopedic Implant) ×2 IMPLANT
KIT STIMULAN RAPID CURE 10CC (Orthopedic Implant) ×3 IMPLANT
KIT TOTAL KNEE OPTIVAC 40/80GM (Knees) IMPLANT
MANIFOLD NEPTUNE II (INSTRUMENTS) ×2 IMPLANT
MOLD CEMENT FEMORAL 70MM ×1 IMPLANT
MOLD CEMENT TIBIAL 80MM (Orthopedic Implant) ×1 IMPLANT
NDL SAFETY ECLIPSE 18X1.5 (NEEDLE) ×1 IMPLANT
NEEDLE HYPO 18GX1.5 SHARP (NEEDLE) ×2
NS IRRIG 1000ML POUR BTL (IV SOLUTION) ×2 IMPLANT
PACK TOTAL JOINT (CUSTOM PROCEDURE TRAY) ×2 IMPLANT
PADDING CAST COTTON 6X4 STRL (CAST SUPPLIES) ×3 IMPLANT
POSITIONER SURGICAL ARM (MISCELLANEOUS) ×2 IMPLANT
SET HNDPC FAN SPRY TIP SCT (DISPOSABLE) ×1 IMPLANT
SET PAD KNEE POSITIONER (MISCELLANEOUS) ×2 IMPLANT
SPONGE GAUZE 2X2 STER 10/PKG (GAUZE/BANDAGES/DRESSINGS) ×1
SPONGE GAUZE 4X4 12PLY (GAUZE/BANDAGES/DRESSINGS) ×3 IMPLANT
SPONGE LAP 18X18 X RAY DECT (DISPOSABLE) ×2 IMPLANT
STAPLER VISISTAT 35W (STAPLE) ×1 IMPLANT
SUCTION FRAZIER 12FR DISP (SUCTIONS) ×2 IMPLANT
SUT PDS AB 1 CT1 27 (SUTURE) ×3 IMPLANT
SUT VIC AB 1 CT1 36 (SUTURE) ×6 IMPLANT
SUT VIC AB 2-0 CT1 27 (SUTURE) ×6
SUT VIC AB 2-0 CT1 TAPERPNT 27 (SUTURE) ×3 IMPLANT
SYR 50ML LL SCALE MARK (SYRINGE) ×2 IMPLANT
TOWEL OR 17X26 10 PK STRL BLUE (TOWEL DISPOSABLE) ×4 IMPLANT
TOWER CARTRIDGE SMART MIX (DISPOSABLE) ×2 IMPLANT
TRAY FOLEY CATH 14FRSI W/METER (CATHETERS) ×2 IMPLANT
TUBE ANAEROBIC SPECIMEN COL (MISCELLANEOUS) ×4 IMPLANT
WATER STERILE IRR 1500ML POUR (IV SOLUTION) ×2 IMPLANT
WRAP KNEE MAXI GEL POST OP (GAUZE/BANDAGES/DRESSINGS) ×2 IMPLANT

## 2011-10-15 NOTE — Progress Notes (Signed)
ANTIBIOTIC CONSULT NOTE - INITIAL  Pharmacy Consult for Vancomycin Indication: Infected left TKA s/p resection  Allergies  Allergen Reactions  . Penicillins Swelling    Diffuse swelling    Patient Measurements: Height: 6' (182.9 cm) Weight: 290 lb (131.543 kg) IBW/kg (Calculated) : 77.6    Vital Signs: Temp: 97 F (36.1 C) (04/16 1445) Temp src: Oral (04/16 1445) BP: 150/77 mmHg (04/16 1445) Pulse Rate: 98  (04/16 1445) Intake/Output from previous day:   Intake/Output from this shift: Total I/O In: 3000 [I.V.:3000] Out: 300 [Urine:200; Blood:100]  Labs: No results found for this basename: WBC:3,HGB:3,PLT:3,LABCREA:3,CREATININE:3 in the last 72 hours Estimated Creatinine Clearance: 119.4 ml/min (by C-G formula based on Cr of 0.9). CrCl = 86 ml/min (normalized)   Microbiology: Recent Results (from the past 720 hour(s))  SURGICAL PCR SCREEN     Status: Normal   Collection Time   10/08/11 10:10 AM      Component Value Range Status Comment   MRSA, PCR NEGATIVE  NEGATIVE  Final    Staphylococcus aureus NEGATIVE  NEGATIVE  Final   GRAM STAIN     Status: Normal   Collection Time   10/15/11 10:30 AM      Component Value Range Status Comment   Specimen Description WOUND LT LEG TIBIA   Final    Special Requests NONE   Final    Gram Stain     Final    Value: NO ORGANISMS SEEN     RARE WBC PRESENT, PREDOMINANTLY PMN     Gram Stain Report Called to,Read Back By and Verified With: J PLATT AT 1315 ON 04.16.2013 BY NBROOKS   Report Status 10/15/2011 FINAL   Final   GRAM STAIN     Status: Normal   Collection Time   10/15/11 11:36 AM      Component Value Range Status Comment   Specimen Description SYNOVIAL LEFT KNEE   Final    Special Requests NONE   Final    Gram Stain     Final    Value: NO ORGANISMS SEEN     FEW WBC PRESENT, PREDOMINANTLY PMN     Gram Stain Report Called to,Read Back By and Verified With: J PLATT AT 1211 ON 04.16.2013 BY D MURPHY   Report Status 10/15/2011  FINAL   Final     Medical History: Past Medical History  Diagnosis Date  . GERD (gastroesophageal reflux disease)   . Dry eyes, bilateral     Dr Hazle Quant  . Nephrolithiasis     Dr Aldean Ast  . Hyperuricemia   . Abnormal chest x-ray     ? granulomata  . Hypertension     NEW DIAGNOSIS AND STARTED ON METOPROLOL ON 10/04/11  . Elevated liver enzymes     HISTORY OF ELEVATED LIVER ENZYMES  . Thrombocytopenia     HX OF LOW PLATELET COUNT  . Sleep apnea     STOP BANG SCORE OF 4    Medications:  Scheduled:    . chlorhexidine  60 mL Topical Once  . HYDROcodone-acetaminophen  1-2 tablet Oral Q4H  . HYDROmorphone      . HYDROmorphone      . tobramycin  1.2 g Topical To OR  . tranexamic acid (CYLOKAPRON) IVPB (ORTHO)  15 mg/kg Intravenous Once  . vancomycin  1,000 mg Other To OR   Assessment: 38 yom s/p resection of L TKA with antibiotic spacer placement on 10/15/11.  Goal of Therapy:  Vancomycin trough level 15-20 mcg/ml  Plan:   Vancomycin 2gm IV x 1 for weight >100kg, then  Vancomycin 1250mg  IV q12h  Check trough at steady state  Follow renal function & cultures   Loralee Pacas, PharmD, BCPS Pager: [phone] 10/15/2011,2:55 PM

## 2011-10-15 NOTE — Interval H&P Note (Signed)
History and Physical Interval Note:  10/15/2011 9:32 AM  Timothy Mckinney  has presented today for surgery, with the diagnosis of infected left total knee   The various methods of treatment have been discussed with the patient and family. After consideration of risks, benefits and other options for treatment, the patient has consented to  Procedure(s) (LRB): RESECTION OF left TOTAL KNEE ARTHROPLASTY WITH ANTIBIOTIC SPACERS and beads (Left) as a surgical intervention .  The patients' history has been reviewed, patient examined, no change in status, stable for surgery.  I have reviewed the patients' chart and labs.  Questions were answered to the patient's satisfaction.     Shelda Pal

## 2011-10-15 NOTE — Progress Notes (Signed)
Pt states he has an ulcer on his colon which sometimes bleeds and it is bleeding this am. Dr. Charlann Boxer was notified

## 2011-10-15 NOTE — Anesthesia Preprocedure Evaluation (Signed)
Anesthesia Evaluation  Patient identified by MRN, date of birth, ID band Patient awake    Reviewed: Allergy & Precautions, H&P , NPO status , Patient's Chart, lab work & pertinent test results, reviewed documented beta blocker date and time   Airway Mallampati: II TM Distance: >3 FB Neck ROM: Full    Dental  (+) Dental Advisory Given   Pulmonary neg pulmonary ROS,  breath sounds clear to auscultation        Cardiovascular hypertension, Pt. on medications Rhythm:Regular Rate:Normal  Denies cardiac symptoms   Neuro/Psych negative neurological ROS  negative psych ROS   GI/Hepatic negative GI ROS, Neg liver ROS,   Endo/Other  obesity  Renal/GU negative Renal ROS  negative genitourinary   Musculoskeletal   Abdominal   Peds negative pediatric ROS (+)  Hematology negative hematology ROS (+)   Anesthesia Other Findings   Reproductive/Obstetrics negative OB ROS                           Anesthesia Physical Anesthesia Plan  ASA: III  Anesthesia Plan: General   Post-op Pain Management:    Induction: Intravenous  Airway Management Planned: Oral ETT  Additional Equipment:   Intra-op Plan:   Post-operative Plan: Extubation in OR  Informed Consent: I have reviewed the patients History and Physical, chart, labs and discussed the procedure including the risks, benefits and alternatives for the proposed anesthesia with the patient or authorized representative who has indicated his/her understanding and acceptance.   Dental advisory given  Plan Discussed with: CRNA and Surgeon  Anesthesia Plan Comments:         Anesthesia Quick Evaluation

## 2011-10-15 NOTE — Anesthesia Postprocedure Evaluation (Signed)
  Anesthesia Post-op Note  Patient: Timothy Mckinney  Procedure(s) Performed: Procedure(s) (LRB): EXCISIONAL TOTAL KNEE ARTHROPLASTY WITH ANTIBIOTIC SPACERS (Left)  Patient Location: PACU  Anesthesia Type: General  Level of Consciousness: oriented and sedated  Airway and Oxygen Therapy: Patient Spontanous Breathing and Patient connected to nasal cannula oxygen  Post-op Pain: mild  Post-op Assessment: Post-op Vital signs reviewed, Patient's Cardiovascular Status Stable, Respiratory Function Stable and Patent Airway  Post-op Vital Signs: stable  Complications: No apparent anesthesia complications

## 2011-10-15 NOTE — Brief Op Note (Signed)
10/15/2011  3:02 PM  PATIENT:  Timothy Mckinney  61 y.o. male  PRE-OPERATIVE DIAGNOSIS:  infected left total knee replacement  POST-OPERATIVE DIAGNOSIS:  infected left total knee replacement  PROCEDURE:  Procedure(s) (LRB): EXCISIONAL TOTAL KNEE ARTHROPLASTY WITH ANTIBIOTIC SPACERS (Left)  SURGEON:  Surgeon(s) and Role:    * Shelda Pal, MD - Primary  PHYSICIAN ASSISTANT: Leilani Able, PA-C   ANESTHESIA:   general  EBL:  Total I/O In: 3060 [P.O.:60; I.V.:3000] Out: 420 [Urine:200; Drains:120; Blood:100]  BLOOD ADMINISTERED:none  DRAINS: (1 medium) Hemovact drain(s) in the left knee with  Suction Open   LOCAL MEDICATIONS USED:  NONE  SPECIMEN:  Source of Specimen:  left knee, and left tibia  DISPOSITION OF SPECIMEN:  PATHOLOGY  COUNTS:  YES  TOURNIQUET:   Total Tourniquet Time Documented: Thigh (Left) - 91 minutes  DICTATION: .Other Dictation: Dictation Number [id number]  PLAN OF CARE: Admit to inpatient   PATIENT DISPOSITION:  PACU - hemodynamically stable.   Delay start of Pharmacological VTE agent (>24hrs) due to surgical blood loss or risk of bleeding: no

## 2011-10-15 NOTE — Transfer of Care (Signed)
Immediate Anesthesia Transfer of Care Note  Patient: Timothy Mckinney  Procedure(s) Performed: Procedure(s) (LRB): EXCISIONAL TOTAL KNEE ARTHROPLASTY WITH ANTIBIOTIC SPACERS (Left)  Patient Location: PACU  Anesthesia Type: General  Level of Consciousness: sedated, patient cooperative and responds to stimulaton  Airway & Oxygen Therapy: Patient Spontanous Breathing and Patient connected to face mask oxgen  Post-op Assessment: Report given to PACU RN and Post -op Vital signs reviewed and stable  Post vital signs: Reviewed and stable  Complications: No apparent anesthesia complications

## 2011-10-16 ENCOUNTER — Inpatient Hospital Stay (HOSPITAL_COMMUNITY): Payer: 59

## 2011-10-16 DIAGNOSIS — I10 Essential (primary) hypertension: Secondary | ICD-10-CM

## 2011-10-16 LAB — CBC
Hemoglobin: 11.4 g/dL — ABNORMAL LOW (ref 13.0–17.0)
MCH: 32.7 pg (ref 26.0–34.0)
RBC: 3.49 MIL/uL — ABNORMAL LOW (ref 4.22–5.81)

## 2011-10-16 LAB — BASIC METABOLIC PANEL
CO2: 25 mEq/L (ref 19–32)
Glucose, Bld: 114 mg/dL — ABNORMAL HIGH (ref 70–99)
Potassium: 4.1 mEq/L (ref 3.5–5.1)
Sodium: 132 mEq/L — ABNORMAL LOW (ref 135–145)

## 2011-10-16 MED ORDER — CIPROFLOXACIN HCL 500 MG PO TABS
500.0000 mg | ORAL_TABLET | Freq: Two times a day (BID) | ORAL | Status: DC
  Administered 2011-10-16 – 2011-10-17 (×2): 500 mg via ORAL
  Filled 2011-10-16 (×3): qty 1

## 2011-10-16 MED ORDER — SODIUM CHLORIDE 0.9 % IJ SOLN: 10.0000 mL | INTRAMUSCULAR | Status: DC | PRN

## 2011-10-16 NOTE — Consult Note (Signed)
Date of Admission:  10/15/2011  Date of Consult:  10/16/2011  Reason for Consult: Osteomyelitis L knee Referring Physician: Dr Charlann Boxer  Impression/Recommendation Osteomyelitis L knee Would- continue vancomycin. Add cipro (orally) Check ESR and CRP Await Cx Comment- have put in a call to Dr Nilsa Nutting office to get results of his previous arthrocentesis. Hopefully his current Cx will be revealing.   Timothy Mckinney is an 62 y.o. male.  HPI: 61 yo M with hx of arthroscopy 2009, L TKR September 2011. He states after surgery he had recurrent swelling and heat. He had difficulty with limitation of movement as well. He was recently injured when at a restaurant, his knee was accidentally hit with a chair. Due to his continued swelling, he underwent arthrocentesis and was told his knee was infected. He presented to [facility] 4-16 and underwent L TKR resection and placement of anbx spacer.   Past Medical History  Diagnosis Date  . GERD (gastroesophageal reflux disease)   . Dry eyes, bilateral     Dr Hazle Quant  . Nephrolithiasis     Dr Aldean Ast  . Hyperuricemia   . Abnormal chest x-ray     ? granulomata  . Hypertension     NEW DIAGNOSIS AND STARTED ON METOPROLOL ON 10/04/11  . Elevated liver enzymes     HISTORY OF ELEVATED LIVER ENZYMES  . Thrombocytopenia     HX OF LOW PLATELET COUNT  . Sleep apnea     STOP BANG SCORE OF 4    Past Surgical History  Procedure Date  . Cardiac catheterization     in 40s, negative  . Tear duct plugs   . Knee arthroscopy 2009     L  . Colonoscopy w/ polypectomy 2011    Dr Jarold Motto  . Colon ulcer 2011  . Total knee arthroplasty 2011    Workman's Comp case, Dr Ranell Patrick  . Lithotripsy   ergies:   Allergies  Allergen Reactions  . Penicillins Swelling    Diffuse swelling    Medications:  Scheduled:   . allopurinol  300 mg Oral Daily  . celecoxib  200 mg Oral Daily  . cycloSPORINE  1 drop Both Eyes Daily  . docusate sodium  100 mg Oral BID  . fentaNYL      .  ferrous sulfate  325 mg Oral TID PC  . HYDROcodone-acetaminophen  1-2 tablet Oral Q4H  . HYDROmorphone      . HYDROmorphone      . metoprolol tartrate  25 mg Oral BID  . metroNIDAZOLE   Topical BID  . polyethylene glycol  17 g Oral BID  . potassium citrate  20 mEq Oral TID WC  . rivaroxaban  10 mg Oral Q24H  . sodium bicarbonate  1,950 mg Oral TID  . vancomycin  1,250 mg Intravenous Q12H  . vancomycin  2,000 mg Intravenous Once  . DISCONTD: chlorhexidine  60 mL Topical Once  . DISCONTD: tobramycin  1.2 g Topical To OR    Social History:  reports that he has never smoked. He does not have any smokeless tobacco history on file. He reports that he drinks alcohol. He reports that he does not use illicit drugs.  Family History  Problem Relation Age of Onset  . Heart disease Father   . Cancer Paternal Grandfather     throat , pipe smoker  . Colon polyps Mother   . Dementia Mother     died post SDH    General ROS: no dysphagia. No  change in weight. No fever or chills. Has had erythema and heat in his knee. Has had erythema proximally. Has occas BRBPR (due to a colonic ulcer of ? Etiology) See HPI.   Blood pressure 136/63, pulse 88, temperature 97.9 F (36.6 C), temperature source Oral, resp. rate 16, height 6' (1.829 m), weight 131.543 kg (290 lb), SpO2 98.00%. General appearance: alert, cooperative and no distress Eyes: negative findings: pupils equal, round, reactive to light and accomodation Throat: lips, mucosa, and tongue normal; teeth and gums normal Neck: no adenopathy Lungs: clear to auscultation bilaterally Heart: regular rate and rhythm Abdomen: normal findings: bowel sounds normal and soft, non-tender Extremities: LLE is wrapped. grossly NL light touch BLE.    Results for orders placed during the hospital encounter of 10/15/11 (from the past 48 hour(s))  TYPE AND SCREEN     Status: Normal   Collection Time   10/15/11  8:15 AM      Component Value Range Comment    ABO/RH(D) O POS      Antibody Screen NEG      Sample Expiration 10/18/2011     ABO/RH     Status: Normal   Collection Time   10/15/11  8:30 AM      Component Value Range Comment   ABO/RH(D) O POS     WOUND CULTURE     Status: Normal (Preliminary result)   Collection Time   10/15/11 10:30 AM      Component Value Range Comment   Specimen Description WOUND LT LEG TIBIA      Special Requests NONE      Gram Stain        Value: NO WBC SEEN     NO SQUAMOUS EPITHELIAL CELLS SEEN     NO ORGANISMS SEEN   Culture NO GROWTH 1 DAY      Report Status PENDING     ANAEROBIC CULTURE     Status: Normal (Preliminary result)   Collection Time   10/15/11 10:30 AM      Component Value Range Comment   Specimen Description WOUND LT LEG TIBIA      Special Requests NONE      Gram Stain        Value: RARE WBC PRESENT, PREDOMINANTLY PMN     NO ORGANISMS SEEN     Performed by [facility] Gram Stain Report Called to,Read Back By and Verified With: Gram Stain Report Called to,Read Back By and Verified With: J PLATT 1315 10/15/2011 BY NBROOK   Culture        Value: NO ANAEROBES ISOLATED; CULTURE IN PROGRESS FOR 5 DAYS   Report Status PENDING     GRAM STAIN     Status: Normal   Collection Time   10/15/11 10:30 AM      Component Value Range Comment   Specimen Description WOUND LT LEG TIBIA      Special Requests NONE      Gram Stain        Value: NO ORGANISMS SEEN     RARE WBC PRESENT, PREDOMINANTLY PMN     Gram Stain Report Called to,Read Back By and Verified With: J PLATT AT 1315 ON 04.16.2013 BY NBROOKS   Report Status 10/15/2011 FINAL     GRAM STAIN     Status: Normal   Collection Time   10/15/11 11:36 AM      Component Value Range Comment   Specimen Description SYNOVIAL LEFT KNEE      Special  Requests NONE      Gram Stain        Value: NO ORGANISMS SEEN     FEW WBC PRESENT, PREDOMINANTLY PMN     Gram Stain Report Called to,Read Back By and Verified With: J PLATT AT 1211 ON 04.16.2013 BY D  MURPHY   Report Status 10/15/2011 FINAL     ANAEROBIC CULTURE     Status: Normal (Preliminary result)   Collection Time   10/15/11 11:36 AM      Component Value Range Comment   Specimen Description SYNOVIAL LEFT KNEE      Special Requests NONE      Gram Stain PENDING      Culture        Value: NO ANAEROBES ISOLATED; CULTURE IN PROGRESS FOR 5 DAYS   Report Status PENDING     BODY FLUID CULTURE     Status: Normal (Preliminary result)   Collection Time   10/15/11 11:44 AM      Component Value Range Comment   Specimen Description SYNOVIAL      Special Requests NONE      Gram Stain        Value: FEW WBC PRESENT, PREDOMINANTLY PMN     NO ORGANISMS SEEN     Gram Stain Report Called to,Read Back By and Verified With: Gram Stain Report Called to,Read Back By and Verified With: J PLATT @ 1211 ON 10/15/11 BY D MURPHY Performed by [facility]   Culture NO GROWTH 1 DAY      Report Status PENDING     CBC     Status: Abnormal   Collection Time   10/16/11  4:53 AM      Component Value Range Comment   WBC 5.7  4.0 - 10.5 (K/uL)    RBC 3.49 (*) 4.22 - 5.81 (MIL/uL)    Hemoglobin 11.4 (*) 13.0 - 17.0 (g/dL)    HCT 16.1 (*) 09.6 - 52.0 (%)    MCV 100.9 (*) 78.0 - 100.0 (fL)    MCH 32.7  26.0 - 34.0 (pg)    MCHC 32.4  30.0 - 36.0 (g/dL)    RDW 04.5  40.9 - 81.1 (%)    Platelets 161  150 - 400 (K/uL)   BASIC METABOLIC PANEL     Status: Abnormal   Collection Time   10/16/11  4:53 AM      Component Value Range Comment   Sodium 132 (*) 135 - 145 (mEq/L)    Potassium 4.1  3.5 - 5.1 (mEq/L)    Chloride 98  96 - 112 (mEq/L)    CO2 25  19 - 32 (mEq/L)    Glucose, Bld 114 (*) 70 - 99 (mg/dL)    BUN 12  6 - 23 (mg/dL)    Creatinine, Ser 9.14  0.50 - 1.35 (mg/dL)    Calcium 8.8  8.4 - 10.5 (mg/dL)    GFR calc non Af Amer 87 (*) >90 (mL/min)    GFR calc Af Amer >90  >90 (mL/min)       Component Value Date/Time   SDES SYNOVIAL 10/15/2011 1144   SPECREQUEST NONE 10/15/2011 1144   CULT NO  GROWTH 1 DAY 10/15/2011 1144   REPTSTATUS PENDING 10/15/2011 1144   No results found.  Thank you so much for this interesting consult,   Johny Sax [phone] 10/16/2011, 3:27 PM     LOS: 1 day

## 2011-10-16 NOTE — Progress Notes (Signed)
Subjective: 1 Day Post-Op Procedure(s) (LRB): EXCISIONAL TOTAL KNEE ARTHROPLASTY WITH ANTIBIOTIC SPACERS (Left)   Patient reports pain as mild. Pain well controlled. No events throughout the night. Lots of questions on what the progression was from here, these were invited and answered.   Objective:   VITALS:   Filed Vitals:   10/16/11 0550  BP: 138/83  Pulse: 84  Temp: 98.2 F (36.8 C)  Resp: 16    Neurovascular intact Dorsiflexion/Plantar flexion intact Incision: dressing C/D/I No cellulitis present Compartment soft  LABS  Basename 10/16/11 0453  HGB 11.4*  HCT 35.2*  WBC 5.7  PLT 161     Basename 10/16/11 0453  NA 132*  K 4.1  BUN 12  CREATININE 0.96  GLUCOSE 114*     Assessment/Plan: 1 Day Post-Op Procedure(s) (LRB): EXCISIONAL TOTAL KNEE ARTHROPLASTY WITH ANTIBIOTIC SPACERS (Left)   HV drain d/c'ed Foley cath d/c'ed Advance diet Up with therapy, 50% WB left leg Should be seen by ID today PICC line placement should occur today If he continues to do well, possibly d/c home tomorrow.   Anastasio Auerbach Gavriela Cashin   PAC  10/16/2011, 8:48 AM

## 2011-10-16 NOTE — Op Note (Signed)
NAMEADMIR, Timothy Mckinney NO.:  [id number]  MEDICAL RECORD NO.:  [id number]  LOCATION:  1518                         FACILITY:  [facility]  PHYSICIAN:  Timothy Frankel. Charlann Mckinney, M.D.  DATE OF BIRTH:  06-13-1951  DATE OF PROCEDURE:  10/15/2011 DATE OF DISCHARGE:                              OPERATIVE REPORT   PREOPERATIVE DIAGNOSIS:  Infected left total knee replacement.  POSTOPERATIVE DIAGNOSIS:  Infected left total knee replacement.  PROCEDURE:  Resection of left total knee replacement with placement of antibiotic spacer molds from [employer] and Stimulan beads all with vancomycin and tobramycin mixed.  SURGEON:  Timothy Frankel. Charlann Mckinney, M.D.  ASSISTANT:  Timothy Bitter. Mckinney, P.A.  Please note that Mr. Mckinney was present from the entirety of the case from preoperative position, general facilitation of the case, management, and protection of soft tissues of the lower extremity.  ANESTHESIA:  General.  SPECIMENS:  I did take samples for joint fluid upon entry into the joint as well as some tissue some samples from the proximal tibia with large cystic changes noted.  TOURNIQUET TIME:  90 minutes at 250 mmHg.  BLOOD LOSS:  About 100 cc.  DRAINS:  One medium Hemovac.  INDICATIONS FOR PROCEDURE:  Timothy Mckinney is a 61 year old male with a history of a left total knee replacement performed about 20 months ago. He was seen and evaluated in consultation with persistent pain, swelling.  He had an aspiration of the knee that revealed 49,000 white blood cells, elevated CRP and sed rate.  After reviewing these findings with him, I called for resection of his knee and treatment in two-stage technique.  His cultures did not yet grown out any bacteria.  Once it was a little bit hard for him to grasp, however, based on the findings, this is my recommendation.  Risks will be recurrent infection, DVT, as well as planned course of action including reimplantation within the 2- to 66-month period.  Time  was all reviewed and discussed.  Consent was obtained for above.  PROCEDURE IN DETAIL:  The patient was brought to the operative theater. Once adequate anesthesia, preoperative antibiotics, vancomycin administered based on a penicillin allergy.  He was positioned supine. Left thigh tourniquet was placed.  Left lower extremity was then prepped and draped in a sterile fashion and the left foot placed in [facility] leg holder.  The leg was exsanguinated, tourniquet elevated.  The time-out was performed, identifying the patient, planned procedure, and extremity. Leg was exsanguinated, tourniquet elevated to 250 mmHg.  His old incision was utilized and excised the old scar.  Sharp dissection, soft tissue planes created, an arthrotomy was made, encountering purulent looking fluid, this was cultured.  At this point, exposure was carried out including extensive medial and lateral and suprapatellar synovectomy for the purpose of a synovectomy as well as debridement.  The patella was everted.  Using an oscillating saw, I removed the patellar button and removed the remaining polyethylene from the lug hole area as well as some cement.  At this point, the knee was flexed.  Using a small ACL saw blade, I undermine the tissue of the bone cement interface of the tibia encountered  and very soft bone as well as cystic changes identified on the radiographs medially.  After couple attempts, I trying to get this stem at this point, I directed my attention now to the femur.  The femoral component was exposed, synovectomy carried out along the border and again the ACL saw was used to undermine the bone-cement interface of the femoral component along the anterior, anterior chamfer, and distal portion of the component.  Once this was done simultaneously, we used osteotomes medially and laterally.  We were able to remove the femoral component with a little bit of bone loss in the notch area.  Once the  femoral component was removed and excess cement was removed throughout the knee, the attention was redirected back to the tibia. The tibia was subluxated anteriorly.  The further synovectomy carried out in the posterior aspect of knee.  After further undermining of the cement bone interface, I was able to remove the tibial component without any bone loss.  Other than that, there was caused by the ongoing infection.  A further debridement was carried out and proximal tibia removing all nonviable tissue, and fibrous lining.  The cement within the canal was removed as well using osteotomes.  Once all cement was removed, I used a canal brush irrigator and irrigated both the tibia and femoral canals. While this was carried out, we had mixed on the back table 20 cc of stimulant with 1 g vancomycin and 1.2 g tobramycin for the total.  Once this was set up in the trays, it was left to dry and cure.  We incised the femoral and tibial components to match the components available for the [employer] molds.  The molds were opened and cement mixed for these.  We basically used 2 batches of cement and each batch of cement had 2 g of vancomycin and 2.4 g of tobramycin.  These molds were created on the back table.  At this point, I irrigated the knee with approximately at this point 5 L of normal saline solution with pulse lavage.  Once knee had been adequately debrided through an incisional debridement as well as irrigation and the cement molds ready, another batch of cement was made with antibiotics, in it again with 2 g of vancomycin, 2.4 of tobramycin, this batch of cement was used to cement the components in place.  We supplemented this with another batch cement without any antibiotics.  The final tibial tray and femoral mold were then cemented into place and knee brought to extension until cement fully cured.  While this was being carried out, the pulse lavage was used to further irrigate the knee  out.  Once this was done, the Stimulan beads were applied to the medial lateral suprapatellar pouches.  I reapproximated extensor mechanism using #1 PDS.  A group of this Stimulan beads was stayed in place of the subcutaneous tissues, over which was closed with 2-0 Vicryl, and staples on the skin.  The skin was cleaned, dried, and dressed sterilely using Xeroform, a bulky dressing.  A medium Hemovac drain had been placed in deep.  At this point, the knee was placed into a knee immobilizer.  He was brought to recovery room in stable condition, tolerated the procedure well.     Timothy Mckinney, M.D.     MDO/MEDQ  D:  10/15/2011  T:  10/16/2011  Job:  [id number]

## 2011-10-16 NOTE — Progress Notes (Signed)
Physical Therapy Evaluation Patient Details Name: Timothy Mckinney MRN: [id number] DOB: Dec 23, 1950 Today's Date: 10/16/2011  Problem List:  Patient Active Problem List  Diagnoses  . B12 DEFICIENCY  . GERD  . RECTAL BLEEDING  . TRANSAMINASES, SERUM, ELEVATED  . NEPHROLITHIASIS, HX OF  . HYPERURICEMIA, HX OF  . CHEST XRAY, ABNORMAL  . Unspecified essential hypertension  . S/P left TK resection, implant abx spacer    Past Medical History:  Past Medical History  Diagnosis Date  . GERD (gastroesophageal reflux disease)   . Dry eyes, bilateral     Dr Hazle Quant  . Nephrolithiasis     Dr Aldean Ast  . Hyperuricemia   . Abnormal chest x-ray     ? granulomata  . Hypertension     NEW DIAGNOSIS AND STARTED ON METOPROLOL ON 10/04/11  . Elevated liver enzymes     HISTORY OF ELEVATED LIVER ENZYMES  . Thrombocytopenia     HX OF LOW PLATELET COUNT  . Sleep apnea     STOP BANG SCORE OF 4   Past Surgical History:  Past Surgical History  Procedure Date  . Cardiac catheterization     in 40s, negative  . Tear duct plugs   . Knee arthroscopy 2009     L  . Colonoscopy w/ polypectomy 2011    Dr Jarold Motto  . Colon ulcer 2011  . Total knee arthroplasty 2011    Workman's Comp case, Dr Ranell Patrick  . Lithotripsy     PT Assessment/Plan/Recommendation PT Assessment Clinical Impression Statement: Pt with L TKR resection with spacer placement presents with decreased L LE strength, no ROM allowed at L knee, PWB on L LE and limited functional mobility. PT Recommendation/Assessment: Patient will need skilled PT in the acute care venue PT Problem List: Decreased strength;Decreased range of motion;Decreased activity tolerance;Decreased mobility;Decreased knowledge of use of DME;Pain;Obesity PT Therapy Diagnosis : Difficulty walking PT Plan PT Frequency: 7X/week PT Treatment/Interventions: DME instruction;Gait training;Stair training;Functional mobility training;Therapeutic activities;Therapeutic  exercise;Patient/family education PT Recommendation Recommendations for Other Services: OT consult Follow Up Recommendations: Home health PT Equipment Recommended: None recommended by PT PT Goals  Acute Rehab PT Goals PT Goal Formulation: With patient Time For Goal Achievement: 7 days Pt will go Supine/Side to Sit: with supervision PT Goal: Supine/Side to Sit - Progress: Goal set today Pt will go Sit to Supine/Side: with supervision PT Goal: Sit to Supine/Side - Progress: Goal set today Pt will go Sit to Stand: with supervision PT Goal: Sit to Stand - Progress: Goal set today Pt will go Stand to Sit: with supervision PT Goal: Stand to Sit - Progress: Goal set today Pt will Ambulate: 51 - 150 feet;with supervision;with rolling walker PT Goal: Ambulate - Progress: Goal set today Pt will Go Up / Down Stairs: Flight;with min assist;with least restrictive assistive device  PT Evaluation Precautions/Restrictions  Precautions Required Braces or Orthoses: Knee Immobilizer - Left Knee Immobilizer - Left: On at all times Restrictions Weight Bearing Restrictions: Yes LLE Weight Bearing: Partial weight bearing LLE Partial Weight Bearing Percentage or Pounds: 50% Prior Functioning  Home Living Lives With: Spouse Available Help at Discharge: Family Type of Home: House Home Access: Stairs to enter Secretary/administrator of Steps: 4 Entrance Stairs-Rails: Right Home Layout: Two level Alternate Level Stairs-Number of Steps: 15 Alternate Level Stairs-Rails: Right Home Adaptive Equipment: Walker - rolling;Crutches Prior Function Level of Independence: Independent Able to Take Stairs?: Yes Driving: Yes Cognition Cognition Arousal/Alertness: Awake/alert Overall Cognitive Status: Appears within functional limits for tasks  assessed Orientation Level: Oriented X4 Sensation/Coordination Coordination Gross Motor Movements are Fluid and Coordinated: Yes Extremity Assessment RUE  Assessment RUE Assessment: Within Functional Limits LUE Assessment LUE Assessment: Within Functional Limits RLE Assessment RLE Assessment: Within Functional Limits LLE Assessment LLE Assessment: Exceptions to [facility] (KI in place all times.) Mobility (including Balance) Bed Mobility Bed Mobility: Yes Supine to Sit: 4: Min assist Supine to Sit Details (indicate cue type and reason): cues for sequence and assist to manage L LE Transfers Transfers: Yes Sit to Stand: 4: Min assist Sit to Stand Details (indicate cue type and reason): cues for use of UEs and for LE management Stand to Sit: 4: Min assist Stand to Sit Details: cues for use of UEs and for LE management Ambulation/Gait Ambulation/Gait: Yes Ambulation/Gait Assistance: 4: Min assist Ambulation/Gait Assistance Details (indicate cue type and reason): cues for posture, stride length, sequence and position from RW Ambulation Distance (Feet): 34 Feet Assistive device: Rolling walker Gait Pattern: Step-to pattern    Exercise    End of Session PT - End of Session Equipment Utilized During Treatment: Left knee immobilizer Activity Tolerance: Patient tolerated treatment well Patient left: in chair;with call bell in reach Nurse Communication: Mobility status for transfers;Mobility status for ambulation General Behavior During Session: [facility] for tasks performed Cognition: [facility] for tasks performed  Zaylan Kissoon 10/16/2011, 4:39 PM

## 2011-10-16 NOTE — Progress Notes (Signed)
Physical Therapy Treatment Patient Details Name: Timothy Mckinney MRN: [id number] DOB: 12/25/1950 Today's Date: 10/16/2011  PT Assessment/Plan  PT - Assessment/Plan Comments on Treatment Session: Pt and spouse pleased with progress.   PT Plan: Discharge plan remains appropriate PT Frequency: 7X/week Recommendations for Other Services: OT consult Follow Up Recommendations: Home health PT Equipment Recommended: None recommended by PT PT Goals  Acute Rehab PT Goals PT Goal Formulation: With patient Time For Goal Achievement: 7 days Pt will go Supine/Side to Sit: with supervision PT Goal: Supine/Side to Sit - Progress: Progressing toward goal Pt will go Sit to Supine/Side: with supervision PT Goal: Sit to Supine/Side - Progress: Progressing toward goal Pt will go Sit to Stand: with supervision PT Goal: Sit to Stand - Progress: Progressing toward goal Pt will go Stand to Sit: with supervision PT Goal: Stand to Sit - Progress: Progressing toward goal Pt will Ambulate: 51 - 150 feet;with supervision;with rolling walker PT Goal: Ambulate - Progress: Progressing toward goal Pt will Go Up / Down Stairs: Flight;with min assist;with least restrictive assistive device  PT Treatment Precautions/Restrictions  Precautions Required Braces or Orthoses: Knee Immobilizer - Left Knee Immobilizer - Left: On at all times Restrictions Weight Bearing Restrictions: Yes LLE Weight Bearing: Partial weight bearing LLE Partial Weight Bearing Percentage or Pounds: 50% Mobility (including Balance) Bed Mobility Bed Mobility: Yes Supine to Sit: 4: Min assist Supine to Sit Details (indicate cue type and reason): cues for sequence and assist to manage L LE Sit to Supine: 4: Min assist Sit to Supine - Details (indicate cue type and reason): cues for sequence and min assist for management of L LE Transfers Transfers: Yes Sit to Stand: 4: Min assist;From chair/3-in-1 Sit to Stand Details (indicate cue type and  reason): min cues for use of UEs Stand to Sit: 4: Min assist Stand to Sit Details: min cues for LE managment and use of UEs Ambulation/Gait Ambulation/Gait: Yes Ambulation/Gait Assistance: 4: Min assist Ambulation/Gait Assistance Details (indicate cue type and reason): cues for position from RW, posture  Ambulation Distance (Feet): 126 Feet Assistive device: Rolling walker Gait Pattern: Step-to pattern    Exercise    End of Session PT - End of Session Equipment Utilized During Treatment: Left knee immobilizer Activity Tolerance: Patient tolerated treatment well Patient left: in bed;with call bell in reach;with family/visitor present Nurse Communication: Mobility status for transfers;Mobility status for ambulation General Behavior During Session: [facility] for tasks performed Cognition: [facility] for tasks performed  Jasai Sorg 10/16/2011, 4:52 PM

## 2011-10-17 LAB — BASIC METABOLIC PANEL
BUN: 9 mg/dL (ref 6–23)
CO2: 24 mEq/L (ref 19–32)
Chloride: 101 mEq/L (ref 96–112)
Glucose, Bld: 95 mg/dL (ref 70–99)
Potassium: 4 mEq/L (ref 3.5–5.1)

## 2011-10-17 LAB — CBC
HCT: 31.5 % — ABNORMAL LOW (ref 39.0–52.0)
Hemoglobin: 10.4 g/dL — ABNORMAL LOW (ref 13.0–17.0)
WBC: 5.3 10*3/uL (ref 4.0–10.5)

## 2011-10-17 LAB — C-REACTIVE PROTEIN: CRP: 10.89 mg/dL — ABNORMAL HIGH (ref <0.60)

## 2011-10-17 MED ORDER — FERROUS SULFATE 325 (65 FE) MG PO TABS: 325.0000 mg | ORAL_TABLET | Freq: Three times a day (TID) | ORAL | Status: DC

## 2011-10-17 MED ORDER — POLYETHYLENE GLYCOL 3350 17 G PO PACK: 17.0000 g | PACK | Freq: Two times a day (BID) | ORAL | Status: AC

## 2011-10-17 MED ORDER — CIPROFLOXACIN HCL 500 MG PO TABS: 500.0000 mg | ORAL_TABLET | Freq: Two times a day (BID) | ORAL | Status: AC

## 2011-10-17 MED ORDER — ASPIRIN EC 325 MG PO TBEC: 325.0000 mg | DELAYED_RELEASE_TABLET | Freq: Two times a day (BID) | ORAL | Status: AC

## 2011-10-17 MED ORDER — DSS 100 MG PO CAPS: 100.0000 mg | ORAL_CAPSULE | Freq: Two times a day (BID) | ORAL | Status: AC

## 2011-10-17 MED ORDER — HYDROCODONE-ACETAMINOPHEN 7.5-325 MG PO TABS: 1.0000 | ORAL_TABLET | ORAL | Status: AC | PRN

## 2011-10-17 MED ORDER — SODIUM CHLORIDE 0.9 % IV SOLN: 1250.0000 mg | Freq: Two times a day (BID) | INTRAVENOUS | Status: DC

## 2011-10-17 MED ORDER — METHOCARBAMOL 500 MG PO TABS: 500.0000 mg | ORAL_TABLET | Freq: Four times a day (QID) | ORAL | Status: AC | PRN

## 2011-10-17 NOTE — Progress Notes (Signed)
Physical Therapy Treatment Patient Details Name: Timothy Mckinney MRN: [id number] DOB: 10/26/1950 Today's Date: 10/17/2011  PT Assessment/Plan  PT - Assessment/Plan Comments on Treatment Session: Pt pleased with progress.  Reviewed car transfers PT Plan: Discharge plan remains appropriate PT Frequency: 7X/week Recommendations for Other Services: OT consult Follow Up Recommendations: Home health PT Equipment Recommended: None recommended by PT PT Goals  Acute Rehab PT Goals PT Goal Formulation: With patient Time For Goal Achievement: 7 days Pt will go Supine/Side to Sit: with supervision PT Goal: Supine/Side to Sit - Progress: Progressing toward goal Pt will go Sit to Supine/Side: with supervision PT Goal: Sit to Supine/Side - Progress: Progressing toward goal Pt will go Sit to Stand: with supervision PT Goal: Sit to Stand - Progress: Progressing toward goal Pt will go Stand to Sit: with supervision PT Goal: Stand to Sit - Progress: Progressing toward goal Pt will Ambulate: 51 - 150 feet;with supervision;with rolling walker PT Goal: Ambulate - Progress: Progressing toward goal Pt will Go Up / Down Stairs: Flight;with min assist;with least restrictive assistive device PT Goal: Up/Down Stairs - Progress: Progressing toward goal  PT Treatment Precautions/Restrictions  Precautions Required Braces or Orthoses: Knee Immobilizer - Left Knee Immobilizer - Left: On at all times Restrictions Weight Bearing Restrictions: Yes LLE Weight Bearing: Partial weight bearing LLE Partial Weight Bearing Percentage or Pounds: 50% Mobility (including Balance) Bed Mobility Bed Mobility:  (pt reports doing well with bed mobility after multiple OOB) Transfers Sit to Stand: 5: Supervision;4: Min assist Sit to Stand Details (indicate cue type and reason): min guard for L LE   Stand to Sit: 5: Supervision;4: Min assist Stand to Sit Details: min guard for L LE   Ambulation/Gait Ambulation/Gait  Assistance: 5: Supervision Ambulation/Gait Assistance Details (indicate cue type and reason): min cues for position from RW and PWB Ambulation Distance (Feet): 50 Feet (50' and 2x25') Assistive device: Rolling walker Gait Pattern: Step-to pattern Stairs: Yes Stairs Assistance: 4: Min assist Stairs Assistance Details (indicate cue type and reason): cues for sequence and foot/crutch placement Stair Management Technique: One rail Left;Forwards;Step to pattern;With crutches Number of Stairs: 4     Exercise    End of Session PT - End of Session Equipment Utilized During Treatment: Left knee immobilizer Activity Tolerance: Patient tolerated treatment well Patient left: in chair;with call bell in reach;with family/visitor present Nurse Communication: Mobility status for transfers;Mobility status for ambulation General Behavior During Session: [facility] for tasks performed Cognition: [facility] for tasks performed  Timothy Mckinney 10/17/2011, 2:37 PM

## 2011-10-17 NOTE — Progress Notes (Signed)
Subjective: 2 Days Post-Op Procedure(s) (LRB): EXCISIONAL TOTAL KNEE ARTHROPLASTY WITH ANTIBIOTIC SPACERS (Left)   Patient reports pain as mild. Does have some pain with touching of the area, but in general the pain is well controlled. No other events throughout the night. Unsure why diet hasn't been changed to regular yet, however his bowels are working. Pt ready to be discharged home with home health.  Objective:   VITALS:   Filed Vitals:   10/17/11 0640  BP: 131/81  Pulse: 100  Temp: 98.1 F (36.7 C)  Resp: 19    Neurovascular intact Dorsiflexion/Plantar flexion intact Incision: dressing C/D/I No cellulitis present Compartment soft  LABS  Basename 10/17/11 0600 10/16/11 0453  HGB 10.4* 11.4*  HCT 31.5* 35.2*  WBC 5.3 5.7  PLT 146* 161     Basename 10/17/11 0600 10/16/11 0453  NA 134* 132*  K 4.0 4.1  BUN 9 12  CREATININE 0.83 0.96  GLUCOSE 95 114*     Assessment/Plan: 2 Days Post-Op Procedure(s) (LRB): EXCISIONAL TOTAL KNEE ARTHROPLASTY WITH ANTIBIOTIC SPACERS (Left)   Advance diet Up with therapy D/C IV fluids Discharge home with home health Follow up in 2 weeks at [facility].  Follow-up Information    Follow up with OLIN,Laneta Guerin D in 2 weeks.   Contact information:   [facility] [address] [place] [address] [phone]          Anastasio Auerbach. Johnmark Geiger   PAC  10/17/2011, 10:06 AM

## 2011-10-17 NOTE — Progress Notes (Signed)
UR completed 

## 2011-10-17 NOTE — Progress Notes (Signed)
OT Screen Order received, chart reviewed. Pt has all necessary DME from previous knee sx and states that he has been having no difficulty transferring on/off std toilet. Pt will also have prn A at home. Pt presents with no OT needs at this time. Will sign off.  Garrel Ridgel, OTR/L  Pager [phone] 10/17/2011

## 2011-10-17 NOTE — Progress Notes (Signed)
10-17-11 Spoke with patient's nurse regarding pt's dc plan with the need of IV Vanc at home. Pt has [facility] U[facility]- therefore, [facility] can provide services. Spoke with Amil Amen with [facility] at [phone] who states [facility] is unable to provide services until Saturday. Pt has been dc'd and needs services today. Therefore, chose [facility]. Spoke with Lillia Abed regarding referral for PT/RN services. PCP: Dr Marga Melnick and Dr Charlann Boxer will f/u as outpt as well. Pt reports having an appt already scheduled with Dr Charlann Boxer. Lillia Abed to see patient and family at bedside prior to dc. Pt has all the DME he needs at home per wife.   [place], [place]  [phone]

## 2011-10-17 NOTE — Discharge Summary (Signed)
Physician Discharge Summary  Patient ID: Tramane Gorum MRN: [id number] DOB/AGE: Oct 12, 1950 61 y.o.  Admit date: 10/15/2011 Discharge date: 10/17/2011  Procedures:  Procedure(s) (LRB): EXCISIONAL TOTAL KNEE ARTHROPLASTY WITH ANTIBIOTIC SPACERS (Left)  Attending Physician:  Dr. 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 male with a history of an arthroscopy on this knee in 2009 and total knee replacement in 2011. He states that he has had slight pain in it for awhile, but recently while in a restaurant the knee was hip by a chair. After getting hit, the knee pain increased in pain. He presented to the clinic where the knee was aspirated which revealed elevated white cells in the knee. Dr. Charlann Boxer was consulted for treatment of the left knee. Various options are discussed with the patient. Risks, benefits and expectations were discussed with the patient. Patient understand the risks, benefits and expectations and wishes to proceed with surgery.  PCP: Marga Melnick, MD, MD   Discharged Condition: good  Hospital Course:  Patient underwent the above stated procedure on 10/15/2011. Patient tolerated the procedure well and brought to the recovery room in good condition and subsequently to the floor.  POD #1 BP: 138/83 ; Pulse: 84 ; Temp: 98.2 F (36.8 C) ; Resp: 16  Pt's foley was removed, as well as the hemovac drain removed. IV was changed to a saline lock. Patient reports pain as mild. Pain well controlled. No events throughout the night. Lots of questions on what the progression was from here, these were invited and answered.  Neurovascular intact, dorsiflexion/plantar flexion intact, incision: dressing C/D/I, no cellulitis present and compartment soft.   LABS  Basename  10/16/11 0453    HGB  11.4  HCT  35.2    POD #2  BP: 131/81 ; Pulse: 100 ; Temp: 98.1 F (36.7 C) ; Resp: 19  Patient reports pain as mild. Does have some pain with touching of the area, but in general the pain is well controlled. No other events throughout the night. Pt ready to be discharged home with home health. Neurovascular intact, dorsiflexion/plantar flexion intact, incision: dressing C/D/I, no cellulitis present and compartment soft.   LABS  Basename  10/17/11 0600   HGB  10.4  HCT  31.5    Discharge Exam: General appearance: alert, cooperative and no distress Extremities: Homans sign is negative, no sign of DVT, no edema, redness or tenderness in the calves or thighs and no ulcers, gangrene or trophic changes  Disposition: Home with follow up in 2 weeks  Follow-up Information    Follow up with OLIN,Ellenie Salome D in 2 weeks.   Contact information:   [facility] [address], [address] [place] [address] [phone]          Discharge Orders    Future Orders Please Complete By Expires   Diet - low sodium heart healthy      Call MD / Call 911      Comments:   If you experience chest pain or shortness of breath, CALL 911 and be transported to the hospital emergency room.  If you develope a fever above 101 F, pus (white drainage) or increased drainage or redness at the wound, or calf pain, call your surgeon's office.   Discharge instructions  Comments:   Maintain surgical dressing for 8 days, then replace with gauze and tape. Keep the area dry and clean until follow up. Follow up in 2 weeks at [facility]. Call with any questions or concerns.  Maintain knee immobilizer to assist with ambulation of the left leg. Continue with 50% WB of the left leg.  Obtain trough levels and maintain / adjust Vancomycin to keep trough level 15-20 mcg/ml    Constipation Prevention      Comments:   Drink plenty of fluids.  Prune juice may be  helpful.  You may use a stool softener, such as Colace (over the counter) 100 mg twice a day.  Use MiraLax (over the counter) for constipation as needed.   Increase activity slowly as tolerated      Weight Bearing as taught in Physical Therapy      Comments:   Use a walker or crutches as instructed.   Driving restrictions      Comments:   No driving for 4 weeks   TED hose      Comments:   Use stockings (TED hose) for 2 weeks on both leg(s).  You may remove them at night for sleeping.   Change dressing      Comments:   Maintain surgical dressing for 8 days, then change the dressing daily with sterile 4 x 4 inch gauze dressing and tape. Keep the area dry and clean.      Discharge Medication List as of 10/17/2011 11:38 AM    START taking these medications   Details  aspirin EC 325 MG tablet Take 1 tablet (325 mg total) by mouth 2 (two) times daily. X 4 weeks, Starting 10/17/2011, Until Sun 10/27/11, No Print    ciprofloxacin (CIPRO) 500 MG tablet Take 1 tablet (500 mg total) by mouth 2 (two) times daily., Starting 10/17/2011, Until Sun 10/27/11, Print    docusate sodium 100 MG CAPS Take 100 mg by mouth 2 (two) times daily., Starting 10/17/2011, Until Sun 10/27/11, No Print    ferrous sulfate 325 (65 FE) MG tablet Take 1 tablet (325 mg total) by mouth 3 (three) times daily after meals., Starting 10/17/2011, Until Fri 10/16/12, No Print    HYDROcodone-acetaminophen (NORCO) 7.5-325 MG per tablet Take 1-2 tablets by mouth every 4 (four) hours as needed for pain., Starting 10/17/2011, Until Sun 10/27/11, Print    polyethylene glycol (MIRALAX / GLYCOLAX) packet Take 17 g by mouth 2 (two) times daily., Starting 10/17/2011, Until Sun 10/20/11, No Print    sodium chloride 0.9 % SOLN 250 mL with vancomycin 1000 MG SOLR 1,250 mg Inject 1,250 mg into the vein every 12 (twelve) hours., Starting 10/17/2011, Until Discontinued, Print      CONTINUE these medications which have CHANGED   Details  methocarbamol  (ROBAXIN) 500 MG tablet Take 1 tablet (500 mg total) by mouth every 6 (six) hours as needed (muscle spasms)., Starting 10/17/2011, Until Sun 10/27/11, No Print      CONTINUE these medications which have NOT CHANGED   Details  allopurinol (ZYLOPRIM) 300 MG tablet Take 300 mg by mouth daily., Until Discontinued, Historical Med    celecoxib (CELEBREX) 200 MG capsule Take 200 mg by mouth daily., Until Discontinued, Historical Med    cycloSPORINE (RESTASIS) 0.05 % ophthalmic emulsion Place 1 drop into both eyes daily., Until Discontinued, Historical Med    diphenhydrAMINE (BENADRYL) 25 MG tablet Take 25 mg by mouth. ONE AT BEDTIME IF NEEDED SLEEP, Until Discontinued, Historical Med  metoprolol tartrate (LOPRESSOR) 25 MG tablet Take 1 tablet (25 mg total) by mouth 2 (two) times daily., Starting 10/09/2011, Until Thu 10/08/12, Normal    metroNIDAZOLE (METROGEL) 0.75 % gel Apply topically 2 (two) times daily., Starting 10/09/2011, Until Thu 10/08/12, Normal    sodium bicarbonate 650 MG tablet Take 1,950 mg by mouth 3 (three) times daily., Until Discontinued, Historical Med    potassium citrate (UROCIT-K) 10 MEQ (1080 MG) SR tablet Take 20 mEq by mouth 3 (three) times daily with meals., Until Discontinued, Historical Med        Follow-up Information    Schedule an appointment as soon as possible for a visit with Marga Melnick, MD.   Contact information:   [phone] [address] [employer] [address] [place] [address] [phone]       Follow up with Shelda Pal, MD.   Contact information:   [facility] [address], [address] [place] [address] [phone]       Follow up with [facility]. [facility] PT/RN SERVICES AND IV ABX)    Contact information:   [address] [place], [place]  [phone]         Signed: Anastasio Auerbach. Earlie Schank   PAC  10/17/2011, 5:41 PM

## 2011-10-18 LAB — WOUND CULTURE
Culture: NO GROWTH
Gram Stain: NONE SEEN

## 2011-10-20 LAB — ANAEROBIC CULTURE

## 2011-10-28 ENCOUNTER — Other Ambulatory Visit: Payer: Self-pay | Admitting: Internal Medicine

## 2011-10-28 NOTE — Telephone Encounter (Signed)
refill Metoprolol Tartrate 25MG  T, per fax from [facility] patient would like toprol Instead of Lopressor to take once daily  Qty left 720, day supply 90 Refills left 3 Please send new RX for above request

## 2011-10-28 NOTE — Telephone Encounter (Signed)
Spoke with Brett Canales in the pharmacy and informed him per Dr.Hopper ok to switch

## 2011-11-20 ENCOUNTER — Emergency Department (HOSPITAL_COMMUNITY)
Admission: EM | Admit: 2011-11-20 | Discharge: 2011-11-20 | Disposition: A | Discharge status: Home / Self Care | Payer: 59 | Attending: Emergency Medicine | Admitting: Emergency Medicine

## 2011-11-20 ENCOUNTER — Encounter (HOSPITAL_COMMUNITY): Payer: Self-pay | Admitting: *Deleted

## 2011-11-20 DIAGNOSIS — I999 Unspecified disorder of circulatory system: Secondary | ICD-10-CM

## 2011-11-20 DIAGNOSIS — Z7982 Long term (current) use of aspirin: Secondary | ICD-10-CM | POA: Insufficient documentation

## 2011-11-20 DIAGNOSIS — K219 Gastro-esophageal reflux disease without esophagitis: Secondary | ICD-10-CM | POA: Insufficient documentation

## 2011-11-20 DIAGNOSIS — Z79899 Other long term (current) drug therapy: Secondary | ICD-10-CM | POA: Insufficient documentation

## 2011-11-20 DIAGNOSIS — Y849 Medical procedure, unspecified as the cause of abnormal reaction of the patient, or of later complication, without mention of misadventure at the time of the procedure: Secondary | ICD-10-CM | POA: Insufficient documentation

## 2011-11-20 DIAGNOSIS — M25469 Effusion, unspecified knee: Secondary | ICD-10-CM | POA: Insufficient documentation

## 2011-11-20 DIAGNOSIS — T82898A Other specified complication of vascular prosthetic devices, implants and grafts, initial encounter: Secondary | ICD-10-CM | POA: Insufficient documentation

## 2011-11-20 DIAGNOSIS — I10 Essential (primary) hypertension: Secondary | ICD-10-CM | POA: Insufficient documentation

## 2011-11-20 MED ORDER — ALTEPLASE 2 MG IJ SOLR
2.0000 mg | Freq: Once | INTRAMUSCULAR | Status: AC
  Administered 2011-11-20: 2 mg
  Filled 2011-11-20: qty 2

## 2011-11-20 MED ORDER — SODIUM CHLORIDE 0.9 % IJ SOLN
10.0000 mL | INTRAMUSCULAR | Status: DC | PRN
  Administered 2011-11-20: 20 mL

## 2011-11-20 NOTE — ED Notes (Signed)
Pt comes to ED with c/o of PICC line clogged/ slow infusion. Left Knee surgery 2 years ago. In feb knee became swollen with pain, draw and test knee fluid, pt knee was infected. In April knee was taken out and "2 cement blocks were put in knee that slowly release antibiotics".  Pt was in a brace till last thurs, but cleared to walk with crutch. Right sided PICC line placed in April. Normally pt can run 227ml/ hr. In the last week pt can infuse 140ml/ 4 hours. Pt called dr yesterday about PICC line clogging/ slow infusion. PA called pt back today and told pt to come to ED.

## 2011-11-20 NOTE — ED Notes (Signed)
[id number]<MV> Expected date:<BR> Expected time:<BR> Means of arrival:<BR> Comments:<BR> Timothy Mckinney

## 2011-11-20 NOTE — ED Notes (Signed)
IV team completed PICC assessment, PICC flushes well with good blood return, pt tolerated well

## 2011-11-20 NOTE — ED Provider Notes (Signed)
History     CSN: [id number]  Arrival date & time 11/20/11  4098   First MD Initiated Contact with Patient 11/20/11 2058      Chief Complaint  Patient presents with  . Vascular Access Problem  . check PICC line     (Consider location/radiation/quality/duration/timing/severity/associated sxs/prior treatment) HPI Comments: Timothy Mckinney is a 61 y.o. Male who began having problems with his PICC line yesterday, it was running, slow. Then, he was unable to use it last night or this morning. He is getting a BIDantibiotics for left knee infection. He denies other problems  The history is provided by the patient.    Past Medical History  Diagnosis Date  . GERD (gastroesophageal reflux disease)   . Dry eyes, bilateral     Dr Hazle Quant  . Nephrolithiasis     Dr Aldean Ast  . Hyperuricemia   . Abnormal chest x-ray     ? granulomata  . Hypertension     NEW DIAGNOSIS AND STARTED ON METOPROLOL ON 10/04/11  . Elevated liver enzymes     HISTORY OF ELEVATED LIVER ENZYMES  . Thrombocytopenia     HX OF LOW PLATELET COUNT  . Sleep apnea     STOP BANG SCORE OF 4    Past Surgical History  Procedure Date  . Cardiac catheterization     in 40s, negative  . Tear duct plugs   . Knee arthroscopy 2009     L  . Colonoscopy w/ polypectomy 2011    Dr Jarold Motto  . Colon ulcer 2011  . Total knee arthroplasty 2011    Workman's Comp case, Dr Ranell Patrick  . Lithotripsy   . Knee surgery     april 2013 total knee replacement removed    Family History  Problem Relation Age of Onset  . Heart disease Father   . Cancer Paternal Grandfather     throat , pipe smoker  . Colon polyps Mother   . Dementia Mother     died post SDH    History  Substance Use Topics  . Smoking status: Never Smoker   . Smokeless tobacco: Not on file  . Alcohol Use: Yes      2-3 drinks / day on average      Review of Systems  All other systems reviewed and are negative.    Allergies  Penicillins  Home Medications    Current Outpatient Rx  Name Route Sig Dispense Refill  . ALLOPURINOL 300 MG PO TABS Oral Take 300 mg by mouth daily.    . ASPIRIN EC 81 MG PO TBEC Oral Take 81 mg by mouth 2 (two) times daily.    . CELECOXIB 200 MG PO CAPS Oral Take 200 mg by mouth daily.    Marland Kitchen CIPRO PO Oral Take 1 tablet by mouth 2 (two) times daily. Patient to complete therapy course on 11/28/11.    . CYCLOSPORINE 0.05 % OP EMUL Both Eyes Place 1 drop into both eyes daily.    Marland Kitchen DIPHENHYDRAMINE HCL 25 MG PO TABS Oral Take 25 mg by mouth. ONE AT BEDTIME IF NEEDED SLEEP    . METOPROLOL TARTRATE 25 MG PO TABS Oral Take 25 mg by mouth daily.    Marland Kitchen POTASSIUM CITRATE ER 10 MEQ (1080 MG) PO TBCR Oral Take 20 mEq by mouth 3 (three) times daily with meals.    . SODIUM BICARBONATE 650 MG PO TABS Oral Take 1,950 mg by mouth 3 (three) times daily.    Marland Kitchen VANCOMYCIN  1250 MG IVPB Intravenous Inject 250 mg into the vein every 12 (twelve) hours.      BP 154/85  Pulse 82  Temp(Src) 98.6 F (37 C) (Oral)  Resp 16  SpO2 99%  Physical Exam  Constitutional: He is oriented to person, place, and time. He appears well-developed and well-nourished.  HENT:  Head: Normocephalic.  Eyes: Conjunctivae are normal.  Pulmonary/Chest: Effort normal.  Musculoskeletal:       Left knee, swollen; suture line is intact without discharge, or redness associated. PICC line in right a.c.; site appears, normal, the catheter appears intact  Neurological: He is alert and oriented to person, place, and time. No cranial nerve deficit. He exhibits normal muscle tone.    ED Course  Procedures (including critical care time)  IV the nurse; was able to declot the right arm PICC catheter with TPA   Labs Reviewed - No data to display No results found.   1. Vascular abnormality       MDM  PICC dysfunction, secondary to clot, treated successfully in the emergency department   Plan: Resume usual twice a day treatments at home     Flint Melter,  MD 11/20/11 2227

## 2011-11-20 NOTE — ED Notes (Signed)
IV team at bedside. Pt continues to wait for MD assessment

## 2011-11-20 NOTE — ED Notes (Signed)
IV team paged to assess PICC 

## 2011-11-20 NOTE — Discharge Instructions (Signed)
Followup with Dr Charlann Boxer as needed for problems.

## 2011-12-02 ENCOUNTER — Encounter (HOSPITAL_COMMUNITY): Payer: Self-pay | Admitting: Pharmacy Technician

## 2011-12-09 ENCOUNTER — Encounter (HOSPITAL_COMMUNITY)
Admission: RE | Admit: 2011-12-09 | Discharge: 2011-12-09 | Disposition: A | Discharge status: Home / Self Care | Payer: 59 | Source: Ambulatory Visit | Attending: Orthopedic Surgery | Admitting: Orthopedic Surgery

## 2011-12-09 ENCOUNTER — Encounter (HOSPITAL_COMMUNITY): Payer: Self-pay

## 2011-12-09 HISTORY — DX: Unspecified osteoarthritis, unspecified site: M19.90

## 2011-12-09 LAB — CBC
Hemoglobin: 14.8 g/dL (ref 13.0–17.0)
MCH: 31.4 pg (ref 26.0–34.0)
Platelets: 243 10*3/uL (ref 150–400)
RBC: 4.72 MIL/uL (ref 4.22–5.81)
WBC: 5.6 10*3/uL (ref 4.0–10.5)

## 2011-12-09 LAB — APTT: aPTT: 33 seconds (ref 24–37)

## 2011-12-09 LAB — DIFFERENTIAL
Basophils Absolute: 0 10*3/uL (ref 0.0–0.1)
Basophils Relative: 1 % (ref 0–1)
Eosinophils Relative: 5 % (ref 0–5)
Lymphocytes Relative: 29 % (ref 12–46)
Lymphs Abs: 1.6 10*3/uL (ref 0.7–4.0)
Monocytes Absolute: 0.7 10*3/uL (ref 0.1–1.0)
Neutro Abs: 3 10*3/uL (ref 1.7–7.7)
Neutrophils Relative %: 53 % (ref 43–77)

## 2011-12-09 LAB — BASIC METABOLIC PANEL
CO2: 26 mEq/L (ref 19–32)
Calcium: 9.5 mg/dL (ref 8.4–10.5)
GFR calc non Af Amer: >90 mL/min (ref >90)
Glucose, Bld: 113 mg/dL — ABNORMAL HIGH (ref 70–99)
Potassium: 4.3 mEq/L (ref 3.5–5.1)
Sodium: 137 mEq/L (ref 135–145)

## 2011-12-09 LAB — URINALYSIS, ROUTINE W REFLEX MICROSCOPIC
Glucose, UA: NEGATIVE mg/dL
Hgb urine dipstick: NEGATIVE
Ketones, ur: NEGATIVE mg/dL
Protein, ur: 30 mg/dL — AB

## 2011-12-09 LAB — URINE MICROSCOPIC-ADD ON

## 2011-12-09 NOTE — Pre-Procedure Instructions (Signed)
12/09/11 Teach Back used for preop appointment teaching.

## 2011-12-09 NOTE — Pre-Procedure Instructions (Signed)
12/09/11 Urinalysis with Micro- abnormal- faxed and confirmation received to Dr Lequita Halt.

## 2011-12-09 NOTE — Patient Instructions (Signed)
20 Timothy Mckinney  12/09/2011   Your procedure is scheduled on:  12/16/11 [id number]  Report to [facility] at 0515 AM.  Call this number if you have problems the morning of surgery: [phone]   Remember:   Do not eat food:After Midnight.  May have clear liquids:until Midnight .   Take these medicines the morning of surgery with A SIP OF WATER:    Do not wear jewelry,   Do not wear lotions, powders, or perfumes  Do not shave 48 hours prior to surgery. Men may shave face and neck.  Do not bring valuables to the hospital.  Contacts, dentures or bridgework may not be worn into surgery.  Leave suitcase in the car. After surgery it may be brought to your room.  For patients admitted to the hospital, checkout time is 11:00 AM the day of discharge.      Special Instructions: CHG Shower Use Special Wash: 1/2 bottle night before surgery and 1/2 bottle morning of surgery. shower chin to toes with CHG.  Wash face and private parts with regular soap.     Please read over the following fact sheets that you were given: MRSA Information, Blood Transfusion Fact Sheet, coughing and deep breathing exercises, Incentive Spiriometry Fact Sheet, leg exercises

## 2011-12-09 NOTE — Progress Notes (Signed)
12/09/11 1015  OBSTRUCTIVE SLEEP APNEA  Have you ever been diagnosed with sleep apnea through a sleep study? No  Do you snore loudly (loud enough to be heard through closed doors)?  0  Do you often feel tired, fatigued, or sleepy during the daytime? 1  Has anyone observed you stop breathing during your sleep? 0  Do you have, or are you being treated for high blood pressure? 1  BMI more than 35 kg/m2? 1  Age over 63 years old? 1  Neck circumference greater than 40 cm/18 inches? 1  Gender: 1  Obstructive Sleep Apnea Score 6   Score 4 or greater  Updated health history

## 2011-12-09 NOTE — Pre-Procedure Instructions (Signed)
4/13 EKG and CXR in EPIC

## 2011-12-11 NOTE — Progress Notes (Signed)
H&P performed 12/11/11 Dictation # [id number]

## 2011-12-12 NOTE — H&P (Signed)
NAME:  Timothy Mckinney, Timothy Mckinney                ACCOUNT NO.:  [id number]  MEDICAL RECORD NO.:  [id number]  LOCATION:  [facility]                         FACILITY:  [facility]  PHYSICIAN:  Matthew D. Olin, M.D.  DATE OF BIRTH:  09/03/1950  DATE OF ADMISSION:  12/09/2011 DATE OF DISCHARGE:  12/09/2011                             HISTORY & PHYSICAL   DATE OF SURGERY:  December 16, 2011.  ADMITTING DIAGNOSIS:  Status post resection of infected total knee arthroplasty, left knee, with cement antibiotic spacer.  HISTORY OF PRESENT ILLNESS:  This is a 60-year-old gentleman with a history of total knee arthroplasty with subsequent infection, removal of the implant and placement of antibiotic spacer, who has now undergone antibiotic therapy and is now scheduled for removal of antibiotic spacer and reimplantation of total knee arthroplasty.  The surgery, risks, benefits, and aftercare were discussed in detail, questions were invited and answered.  Note that the patient is a candidate for tranexamic acid and dexamethasone and will receive both at surgery.  He is planning on going home after surgery.  He is given his home medications of aspirin, Robaxin, iron, MiraLax, and Colace.  His medical doctor is Dr. Hopper.  PAST MEDICAL HISTORY:  Drug allergy to PENICILLIN with swelling and rash.  CURRENT MEDICATIONS: 1. Allopurinol 300 mg 1 daily. 2. Benadryl 25 mg 1 q.6-8 h. p.r.n. itching. 3. Celebrex 200 mg 1 daily. 4. Metoprolol succinate 50 mg tablet 1 daily. 5. Potassium citrate 10 mEq 1 daily. 6. Sodium bicarbonate 650 mg tablet 1 t.i.d.  SERIOUS MEDICAL ILLNESSES:  Include hypertension, varicose veins, uric acid kidney stones, and osteoarthritis.  PREVIOUS SURGERIES:  Include arthroscopy with partial meniscectomy, knee replacement in 2011, removal of total knee arthroplasty in 2013 with placement of antibiotic spacer.  FAMILY HISTORY:  Positive for coronary artery disease.  SOCIAL HISTORY:  The patient is  married.  He does not smoke.  Drinks approximately 3 drinks per day, and again is planning on going home after surgery.  REVIEW OF SYSTEMS:  CENTRAL NERVOUS SYSTEM:  Negative for headache, blurred vision, or dizziness.  PULMONARY:  Negative for shortness of breath, PND, and orthopnea.  CARDIOVASCULAR:  Positive for hypertension. Negative for chest pain or palpitation.  GI:  Negative for ulcers, hepatitis.  GU:  Positive for history of uric acid kidney stones. MUSCULOSKELETAL:  Positive as in HPI.  PHYSICAL EXAMINATION:  GENERAL:  This is a well-developed, well- nourished gentleman, in no acute distress. VITAL SIGNS:  Blood pressure 141/78, pulse 72 and regular, respirations 14. HEENT:  Head normocephalic.  Nose patent.  Ears patent.  Pupils equal, round, and reactive to light.  Throat without injection. NECK:  Supple without adenopathy.  Carotids 2+ without bruit. CHEST:  Clear to auscultation.  No rales or rhonchi.  Respirations 14. HEART:  Regular rate and rhythm at 72 beats per minute without murmur. ABDOMEN:  Soft with active bowel sounds.  No masses or organomegaly. NEUROLOGIC:  The patient is alert and oriented to time, place, and person.  Cranial nerves II through XII are grossly intact. EXTREMITIES:  Show left knee with a well-healed anterior total knee arthroplasty scar.  His knee is not warm   or red.  There is moderate soft tissue swelling.  He has 5 degrees in full extension, further flexion to about 80 degrees today with his antibiotic spacer in place. Neurovascular status intact.  IMPRESSION:  Status post total knee arthroplasty with infection and antibiotic spacer placement.  PLAN:  Removal of antibiotic spacer and reimplantation total knee arthroplasty.     Matayah Reyburn J. Lucendia Leard, P.A.   ______________________________ Matthew D. Olin, M.D.    SJC/MEDQ  D:  12/11/2011  T:  12/12/2011  Job:  [id number] 

## 2011-12-16 ENCOUNTER — Inpatient Hospital Stay (HOSPITAL_COMMUNITY)
Admission: RE | Admit: 2011-12-16 | Discharge: 2011-12-18 | DRG: 468 | Disposition: A | Discharge status: Home Health | Payer: 59 | Source: Ambulatory Visit | Attending: Orthopedic Surgery | Admitting: Orthopedic Surgery

## 2011-12-16 ENCOUNTER — Ambulatory Visit (HOSPITAL_COMMUNITY): Payer: 59 | Admitting: Anesthesiology

## 2011-12-16 ENCOUNTER — Encounter (HOSPITAL_COMMUNITY): Payer: Self-pay | Admitting: Anesthesiology

## 2011-12-16 ENCOUNTER — Encounter (HOSPITAL_COMMUNITY)
Admission: RE | Disposition: A | Discharge status: Home Health | Payer: Self-pay | Source: Ambulatory Visit | Attending: Orthopedic Surgery

## 2011-12-16 ENCOUNTER — Encounter (HOSPITAL_COMMUNITY): Payer: Self-pay | Admitting: *Deleted

## 2011-12-16 DIAGNOSIS — Z79899 Other long term (current) drug therapy: Secondary | ICD-10-CM

## 2011-12-16 DIAGNOSIS — I10 Essential (primary) hypertension: Secondary | ICD-10-CM | POA: Diagnosis present

## 2011-12-16 DIAGNOSIS — Z7982 Long term (current) use of aspirin: Secondary | ICD-10-CM

## 2011-12-16 DIAGNOSIS — Z8249 Family history of ischemic heart disease and other diseases of the circulatory system: Secondary | ICD-10-CM

## 2011-12-16 DIAGNOSIS — M1990 Unspecified osteoarthritis, unspecified site: Secondary | ICD-10-CM | POA: Diagnosis present

## 2011-12-16 DIAGNOSIS — Z96659 Presence of unspecified artificial knee joint: Secondary | ICD-10-CM

## 2011-12-16 DIAGNOSIS — Z4789 Encounter for other orthopedic aftercare: Principal | ICD-10-CM

## 2011-12-16 LAB — TYPE AND SCREEN
ABO/RH(D): O POS
Antibody Screen: NEGATIVE

## 2011-12-16 SURGERY — REVISION, TOTAL ARTHROPLASTY, KNEE
Anesthesia: General | Site: Knee | Laterality: Left | Wound class: Clean

## 2011-12-16 MED ORDER — LIDOCAINE HCL (CARDIAC) 20 MG/ML IV SOLN
INTRAVENOUS | Status: DC | PRN
  Administered 2011-12-16: 50 mg via INTRAVENOUS

## 2011-12-16 MED ORDER — HYDROMORPHONE HCL PF 1 MG/ML IJ SOLN: 0.2500 mg | INTRAMUSCULAR | Status: DC | PRN

## 2011-12-16 MED ORDER — CLINDAMYCIN PHOSPHATE 600 MG/50ML IV SOLN
600.0000 mg | INTRAVENOUS | Status: AC
  Administered 2011-12-16: 600 mg via INTRAVENOUS

## 2011-12-16 MED ORDER — CHLORHEXIDINE GLUCONATE 4 % EX LIQD
60.0000 mL | Freq: Once | CUTANEOUS | Status: DC
  Filled 2011-12-16: qty 60

## 2011-12-16 MED ORDER — BUPIVACAINE-EPINEPHRINE PF 0.25-1:200000 % IJ SOLN
INTRAMUSCULAR | Status: DC | PRN
  Administered 2011-12-16: 50 mL

## 2011-12-16 MED ORDER — ZOLPIDEM TARTRATE 5 MG PO TABS
5.0000 mg | ORAL_TABLET | Freq: Every evening | ORAL | Status: DC | PRN
  Administered 2011-12-17: 5 mg via ORAL
  Filled 2011-12-16: qty 1

## 2011-12-16 MED ORDER — DOCUSATE SODIUM 100 MG PO CAPS
100.0000 mg | ORAL_CAPSULE | Freq: Two times a day (BID) | ORAL | Status: DC
  Administered 2011-12-16 – 2011-12-18 (×4): 100 mg via ORAL

## 2011-12-16 MED ORDER — PHENYLEPHRINE HCL 10 MG/ML IJ SOLN
10.0000 mg | INTRAVENOUS | Status: DC | PRN
  Administered 2011-12-16: 40 ug/min via INTRAVENOUS

## 2011-12-16 MED ORDER — METOCLOPRAMIDE HCL 10 MG PO TABS: 5.0000 mg | ORAL_TABLET | Freq: Three times a day (TID) | ORAL | Status: DC | PRN

## 2011-12-16 MED ORDER — BUPIVACAINE IN DEXTROSE 0.75-8.25 % IT SOLN
INTRATHECAL | Status: DC | PRN
  Administered 2011-12-16: 2 mL via INTRATHECAL

## 2011-12-16 MED ORDER — ONDANSETRON HCL 4 MG/2ML IJ SOLN
INTRAMUSCULAR | Status: DC | PRN
  Administered 2011-12-16: 4 mg via INTRAVENOUS

## 2011-12-16 MED ORDER — ONDANSETRON HCL 4 MG/2ML IJ SOLN: 4.0000 mg | Freq: Four times a day (QID) | INTRAMUSCULAR | Status: DC | PRN

## 2011-12-16 MED ORDER — ALLOPURINOL 300 MG PO TABS
300.0000 mg | ORAL_TABLET | Freq: Every day | ORAL | Status: DC
  Administered 2011-12-17 – 2011-12-18 (×2): 300 mg via ORAL
  Filled 2011-12-16 (×3): qty 1

## 2011-12-16 MED ORDER — PHENYLEPHRINE HCL 10 MG/ML IJ SOLN
INTRAMUSCULAR | Status: DC | PRN
  Administered 2011-12-16 (×4): 80 ug via INTRAVENOUS

## 2011-12-16 MED ORDER — METHOCARBAMOL 100 MG/ML IJ SOLN
500.0000 mg | Freq: Four times a day (QID) | INTRAVENOUS | Status: DC | PRN
  Administered 2011-12-16 (×2): 500 mg via INTRAVENOUS
  Filled 2011-12-16 (×2): qty 5

## 2011-12-16 MED ORDER — POLYETHYLENE GLYCOL 3350 17 G PO PACK
17.0000 g | PACK | Freq: Two times a day (BID) | ORAL | Status: DC
  Administered 2011-12-16 – 2011-12-18 (×4): 17 g via ORAL

## 2011-12-16 MED ORDER — DEXAMETHASONE SODIUM PHOSPHATE 10 MG/ML IJ SOLN
10.0000 mg | Freq: Once | INTRAMUSCULAR | Status: AC
  Administered 2011-12-17: 10 mg via INTRAVENOUS
  Filled 2011-12-16: qty 1

## 2011-12-16 MED ORDER — KETOROLAC TROMETHAMINE 30 MG/ML IJ SOLN
INTRAMUSCULAR | Status: DC | PRN
  Administered 2011-12-16: 30 mg via INTRAMUSCULAR

## 2011-12-16 MED ORDER — METHOCARBAMOL 500 MG PO TABS
500.0000 mg | ORAL_TABLET | Freq: Four times a day (QID) | ORAL | Status: DC | PRN
  Administered 2011-12-17 – 2011-12-18 (×2): 500 mg via ORAL
  Filled 2011-12-16 (×2): qty 1

## 2011-12-16 MED ORDER — PROMETHAZINE HCL 25 MG/ML IJ SOLN: 6.2500 mg | INTRAMUSCULAR | Status: DC | PRN

## 2011-12-16 MED ORDER — FERROUS SULFATE 325 (65 FE) MG PO TABS
325.0000 mg | ORAL_TABLET | Freq: Three times a day (TID) | ORAL | Status: DC
  Administered 2011-12-16 – 2011-12-18 (×5): 325 mg via ORAL
  Filled 2011-12-16 (×8): qty 1

## 2011-12-16 MED ORDER — DIPHENHYDRAMINE HCL 25 MG PO CAPS
25.0000 mg | ORAL_CAPSULE | Freq: Four times a day (QID) | ORAL | Status: DC | PRN
  Administered 2011-12-16 – 2011-12-17 (×2): 25 mg via ORAL
  Filled 2011-12-16 (×2): qty 1

## 2011-12-16 MED ORDER — CELECOXIB 200 MG PO CAPS
200.0000 mg | ORAL_CAPSULE | Freq: Two times a day (BID) | ORAL | Status: DC
  Administered 2011-12-16: 200 mg via ORAL
  Filled 2011-12-16 (×3): qty 1

## 2011-12-16 MED ORDER — HYDROMORPHONE HCL PF 1 MG/ML IJ SOLN
0.5000 mg | INTRAMUSCULAR | Status: DC | PRN
Start: 2011-12-16 — End: 2011-12-18
  Administered 2011-12-16: 2 mg via INTRAVENOUS
  Administered 2011-12-16 (×2): 1 mg via INTRAVENOUS
  Administered 2011-12-17: 2 mg via INTRAVENOUS
  Filled 2011-12-16: qty 1
  Filled 2011-12-16: qty 2
  Filled 2011-12-16: qty 1
  Filled 2011-12-16: qty 2

## 2011-12-16 MED ORDER — MIDAZOLAM HCL 5 MG/5ML IJ SOLN
INTRAMUSCULAR | Status: DC | PRN
  Administered 2011-12-16: 2 mg via INTRAVENOUS
  Administered 2011-12-16 (×2): 1 mg via INTRAVENOUS

## 2011-12-16 MED ORDER — CLINDAMYCIN PHOSPHATE 600 MG/50ML IV SOLN
600.0000 mg | Freq: Four times a day (QID) | INTRAVENOUS | Status: AC
  Administered 2011-12-16 (×2): 600 mg via INTRAVENOUS
  Filled 2011-12-16 (×2): qty 50

## 2011-12-16 MED ORDER — KETAMINE HCL 10 MG/ML IJ SOLN
INTRAMUSCULAR | Status: DC | PRN
  Administered 2011-12-16: 10 mg via INTRAVENOUS
  Administered 2011-12-16 (×26): 2.5 mg via INTRAVENOUS

## 2011-12-16 MED ORDER — LACTATED RINGERS IV SOLN
INTRAVENOUS | Status: DC | PRN
  Administered 2011-12-16 (×2): via INTRAVENOUS

## 2011-12-16 MED ORDER — BUPIVACAINE-EPINEPHRINE 0.25% -1:200000 IJ SOLN
INTRAMUSCULAR | Status: AC
  Filled 2011-12-16: qty 1

## 2011-12-16 MED ORDER — DEXAMETHASONE SODIUM PHOSPHATE 10 MG/ML IJ SOLN
10.0000 mg | Freq: Once | INTRAMUSCULAR | Status: DC
  Filled 2011-12-16: qty 1

## 2011-12-16 MED ORDER — ONDANSETRON HCL 4 MG PO TABS: 4.0000 mg | ORAL_TABLET | Freq: Four times a day (QID) | ORAL | Status: DC | PRN

## 2011-12-16 MED ORDER — SODIUM CHLORIDE 0.9 % IV SOLN
INTRAVENOUS | Status: AC
  Administered 2011-12-16 – 2011-12-17 (×2): via INTRAVENOUS
  Filled 2011-12-16 (×4): qty 1000

## 2011-12-16 MED ORDER — FENTANYL CITRATE 0.05 MG/ML IJ SOLN
INTRAMUSCULAR | Status: DC | PRN
  Administered 2011-12-16: 50 ug via INTRAVENOUS
  Administered 2011-12-16: 12.5 ug via INTRAVENOUS
  Administered 2011-12-16 (×2): 25 ug via INTRAVENOUS
  Administered 2011-12-16 (×2): 12.5 ug via INTRAVENOUS
  Administered 2011-12-16: 25 ug via INTRAVENOUS
  Administered 2011-12-16: 12.5 ug via INTRAVENOUS
  Administered 2011-12-16: 25 ug via INTRAVENOUS
  Administered 2011-12-16: 50 ug via INTRAVENOUS

## 2011-12-16 MED ORDER — FLEET ENEMA 7-19 GM/118ML RE ENEM: 1.0000 | ENEMA | Freq: Once | RECTAL | Status: AC | PRN

## 2011-12-16 MED ORDER — PHENOL 1.4 % MT LIQD: 1.0000 | OROMUCOSAL | Status: DC | PRN

## 2011-12-16 MED ORDER — MENTHOL 3 MG MT LOZG: 1.0000 | LOZENGE | OROMUCOSAL | Status: DC | PRN

## 2011-12-16 MED ORDER — SODIUM BICARBONATE 650 MG PO TABS
1950.0000 mg | ORAL_TABLET | Freq: Three times a day (TID) | ORAL | Status: DC
  Administered 2011-12-16 – 2011-12-17 (×2): 1950 mg via ORAL
  Filled 2011-12-16 (×8): qty 3

## 2011-12-16 MED ORDER — RIVAROXABAN 10 MG PO TABS
10.0000 mg | ORAL_TABLET | ORAL | Status: DC
  Administered 2011-12-17 – 2011-12-18 (×2): 10 mg via ORAL
  Filled 2011-12-16 (×3): qty 1

## 2011-12-16 MED ORDER — SODIUM CHLORIDE 0.9 % IR SOLN
Status: DC | PRN
  Administered 2011-12-16: 1

## 2011-12-16 MED ORDER — POTASSIUM CITRATE ER 10 MEQ (1080 MG) PO TBCR
20.0000 meq | EXTENDED_RELEASE_TABLET | Freq: Three times a day (TID) | ORAL | Status: DC
  Administered 2011-12-17 – 2011-12-18 (×2): 20 meq via ORAL
  Filled 2011-12-16 (×8): qty 2

## 2011-12-16 MED ORDER — HYDROCODONE-ACETAMINOPHEN 7.5-325 MG PO TABS
1.0000 | ORAL_TABLET | ORAL | Status: DC
  Administered 2011-12-16: 2 via ORAL
  Administered 2011-12-16: 1 via ORAL
  Administered 2011-12-16: 2 via ORAL
  Administered 2011-12-16 – 2011-12-17 (×2): 1 via ORAL
  Administered 2011-12-17 (×4): 2 via ORAL
  Administered 2011-12-17: 1 via ORAL
  Administered 2011-12-18 (×3): 2 via ORAL
  Filled 2011-12-16: qty 1
  Filled 2011-12-16 (×3): qty 2
  Filled 2011-12-16: qty 1
  Filled 2011-12-16 (×8): qty 2

## 2011-12-16 MED ORDER — CLINDAMYCIN PHOSPHATE 600 MG/50ML IV SOLN
INTRAVENOUS | Status: AC
  Filled 2011-12-16: qty 50

## 2011-12-16 MED ORDER — KETOROLAC TROMETHAMINE 30 MG/ML IJ SOLN
INTRAMUSCULAR | Status: AC
  Filled 2011-12-16: qty 1

## 2011-12-16 MED ORDER — METOCLOPRAMIDE HCL 5 MG/ML IJ SOLN: 5.0000 mg | Freq: Three times a day (TID) | INTRAMUSCULAR | Status: DC | PRN

## 2011-12-16 MED ORDER — CYCLOSPORINE 0.05 % OP EMUL
1.0000 [drp] | Freq: Every day | OPHTHALMIC | Status: DC
  Administered 2011-12-17 – 2011-12-18 (×2): 1 [drp] via OPHTHALMIC
  Filled 2011-12-16 (×3): qty 1

## 2011-12-16 MED ORDER — ALUM & MAG HYDROXIDE-SIMETH 200-200-20 MG/5ML PO SUSP: 30.0000 mL | ORAL | Status: DC | PRN

## 2011-12-16 MED ORDER — METOPROLOL TARTRATE 25 MG PO TABS
25.0000 mg | ORAL_TABLET | Freq: Every day | ORAL | Status: DC
  Administered 2011-12-17 – 2011-12-18 (×2): 25 mg via ORAL
  Filled 2011-12-16 (×3): qty 1

## 2011-12-16 MED ORDER — TRANEXAMIC ACID 100 MG/ML IV SOLN
15.0000 mg/kg | Freq: Once | INTRAVENOUS | Status: AC
  Administered 2011-12-16: 1972.5 mg via INTRAVENOUS
  Filled 2011-12-16: qty 19.73

## 2011-12-16 MED ORDER — BISACODYL 5 MG PO TBEC: 5.0000 mg | DELAYED_RELEASE_TABLET | Freq: Every day | ORAL | Status: DC | PRN

## 2011-12-16 MED ORDER — DEXAMETHASONE SODIUM PHOSPHATE 4 MG/ML IJ SOLN
INTRAMUSCULAR | Status: DC | PRN
  Administered 2011-12-16: 10 mg via INTRAVENOUS

## 2011-12-16 MED ORDER — PROPOFOL 10 MG/ML IV EMUL
INTRAVENOUS | Status: DC | PRN
  Administered 2011-12-16: 300 ug/kg/min via INTRAVENOUS
  Administered 2011-12-16: 50 ug/kg/min via INTRAVENOUS

## 2011-12-16 SURGICAL SUPPLY — 83 items
ADAPTER BOLT FEMORAL +2/-2 (Knees) ×1 IMPLANT
ADH SKN CLS APL DERMABOND .7 (GAUZE/BANDAGES/DRESSINGS) ×1
ADPR FEM +2/-2 OFST BOLT (Knees) ×1 IMPLANT
ADPR FEM 5D STRL KN PFC SGM (Orthopedic Implant) ×1 IMPLANT
AUG FEM SZ5 4 CMB POST STRL LF (Orthopedic Implant) ×2 IMPLANT
AUG FEM SZ5 4 STRL LF KN LT TI (Knees) ×2 IMPLANT
AUGMENT FMRL POST PFC 4MM SZ5 (Orthopedic Implant) IMPLANT
BAG SPEC THK2 15X12 ZIP CLS (MISCELLANEOUS) ×2
BAG ZIPLOCK 12X15 (MISCELLANEOUS) ×3 IMPLANT
BANDAGE ELASTIC 6 VELCRO ST LF (GAUZE/BANDAGES/DRESSINGS) ×2 IMPLANT
BANDAGE ESMARK 6X9 LF (GAUZE/BANDAGES/DRESSINGS) ×1 IMPLANT
BLADE SAW SGTL 13.0X1.19X90.0M (BLADE) ×2 IMPLANT
BLADE SAW SGTL 81X20 HD (BLADE) ×2 IMPLANT
BNDG CMPR 9X6 STRL LF SNTH (GAUZE/BANDAGES/DRESSINGS) ×1
BNDG ESMARK 6X9 LF (GAUZE/BANDAGES/DRESSINGS) ×2
BONE CEMENT GENTAMICIN (Cement) ×8 IMPLANT
BOWL SMART MIX CTS (DISPOSABLE) IMPLANT
BRUSH FEMORAL CANAL (MISCELLANEOUS) ×1 IMPLANT
CEMENT BONE GENTAMICIN 40 (Cement) IMPLANT
CEMENT RESTRICTOR DEPUY SZ 6 (Cement) ×1 IMPLANT
CLOTH BEACON ORANGE TIMEOUT ST (SAFETY) ×2 IMPLANT
CUFF TOURN SGL QUICK 34 (TOURNIQUET CUFF) ×2
CUFF TRNQT CYL 34X4X40X1 (TOURNIQUET CUFF) ×1 IMPLANT
DERMABOND ADVANCED (GAUZE/BANDAGES/DRESSINGS) ×1
DERMABOND ADVANCED .7 DNX12 (GAUZE/BANDAGES/DRESSINGS) IMPLANT
DRAPE EXTREMITY T 121X128X90 (DRAPE) ×2 IMPLANT
DRAPE POUCH INSTRU U-SHP 10X18 (DRAPES) ×2 IMPLANT
DRAPE U-SHAPE 47X51 STRL (DRAPES) ×2 IMPLANT
DRSG ADAPTIC 3X8 NADH LF (GAUZE/BANDAGES/DRESSINGS) ×2 IMPLANT
DRSG AQUACEL AG ADV 3.5X 6 (GAUZE/BANDAGES/DRESSINGS) ×1 IMPLANT
DRSG PAD ABDOMINAL 8X10 ST (GAUZE/BANDAGES/DRESSINGS) ×2 IMPLANT
DRSG TEGADERM 4X4.75 (GAUZE/BANDAGES/DRESSINGS) ×1 IMPLANT
DURAPREP 26ML APPLICATOR (WOUND CARE) ×2 IMPLANT
ELECT REM PT RETURN 9FT ADLT (ELECTROSURGICAL) ×4
ELECTRODE REM PT RTRN 9FT ADLT (ELECTROSURGICAL) ×1 IMPLANT
EVACUATOR 1/8 PVC DRAIN (DRAIN) ×2 IMPLANT
FACESHIELD LNG OPTICON STERILE (SAFETY) ×10 IMPLANT
FEM POST AUG PFC 4MM SZ5 (Orthopedic Implant) ×4 IMPLANT
FEMORAL ADAPTER (Orthopedic Implant) ×1 IMPLANT
FEMUR LFT TC3 REVISION (Knees) ×1 IMPLANT
GLOVE BIOGEL PI IND STRL 7.5 (GLOVE) ×1 IMPLANT
GLOVE BIOGEL PI IND STRL 8 (GLOVE) ×2 IMPLANT
GLOVE BIOGEL PI INDICATOR 7.5 (GLOVE) ×1
GLOVE BIOGEL PI INDICATOR 8 (GLOVE) ×1
GLOVE ORTHO TXT STRL SZ7.5 (GLOVE) ×5 IMPLANT
GLOVE SURG ORTHO 8.0 STRL STRW (GLOVE) ×2 IMPLANT
GOWN BRE IMP PREV XXLGXLNG (GOWN DISPOSABLE) ×4 IMPLANT
GOWN STRL NON-REIN LRG LVL3 (GOWN DISPOSABLE) ×2 IMPLANT
HANDPIECE INTERPULSE COAX TIP (DISPOSABLE) ×2
IMMOBILIZER KNEE 20 (SOFTGOODS) ×2
IMMOBILIZER KNEE 20 THIGH 36 (SOFTGOODS) IMPLANT
KIT BASIN OR (CUSTOM PROCEDURE TRAY) ×2 IMPLANT
MANIFOLD NEPTUNE II (INSTRUMENTS) ×2 IMPLANT
NDL SAFETY ECLIPSE 18X1.5 (NEEDLE) ×1 IMPLANT
NEEDLE HYPO 18GX1.5 SHARP (NEEDLE) ×2
NS IRRIG 1000ML POUR BTL (IV SOLUTION) ×2 IMPLANT
PACK TOTAL JOINT (CUSTOM PROCEDURE TRAY) ×2 IMPLANT
PADDING CAST COTTON 6X4 STRL (CAST SUPPLIES) ×3 IMPLANT
PATELLA DOME PFC 41MM (Knees) ×1 IMPLANT
PLATE ROT INSERT 12.5MM (Plate) ×1 IMPLANT
POSITIONER SURGICAL ARM (MISCELLANEOUS) ×2 IMPLANT
RESTRICTOR CEMENT SZ 5 C-STEM (Cement) ×1 IMPLANT
SET HNDPC FAN SPRY TIP SCT (DISPOSABLE) ×1 IMPLANT
SET PAD KNEE POSITIONER (MISCELLANEOUS) ×2 IMPLANT
SLEEVE TIB MBT 27/45 40 (Knees) ×1 IMPLANT
SPONGE GAUZE 4X4 12PLY (GAUZE/BANDAGES/DRESSINGS) ×3 IMPLANT
SPONGE LAP 18X18 X RAY DECT (DISPOSABLE) ×3 IMPLANT
STAPLER VISISTAT 35W (STAPLE) ×1 IMPLANT
STEM TIBIA PFC 13X30MM (Stem) ×1 IMPLANT
STEM TIBIA PFC 13X60MM (Stem) ×1 IMPLANT
SUCTION FRAZIER 12FR DISP (SUCTIONS) ×2 IMPLANT
SUT VIC AB 1 CT1 36 (SUTURE) ×3 IMPLANT
SUT VIC AB 2-0 CT1 27 (SUTURE) ×6
SUT VIC AB 2-0 CT1 TAPERPNT 27 (SUTURE) ×3 IMPLANT
SUT VLOC 180 0 24IN GS25 (SUTURE) ×1 IMPLANT
SYR 50ML LL SCALE MARK (SYRINGE) ×2 IMPLANT
TOWEL OR 17X26 10 PK STRL BLUE (TOWEL DISPOSABLE) ×4 IMPLANT
TOWER CARTRIDGE SMART MIX (DISPOSABLE) ×2 IMPLANT
TRAY FOLEY CATH 14FRSI W/METER (CATHETERS) ×2 IMPLANT
TRAY REVISION SZ 4 (Knees) ×1 IMPLANT
WATER STERILE IRR 1500ML POUR (IV SOLUTION) ×2 IMPLANT
WEDGE LEFT SIZE 54MM (Knees) ×2 IMPLANT
WRAP KNEE MAXI GEL POST OP (GAUZE/BANDAGES/DRESSINGS) ×2 IMPLANT

## 2011-12-16 NOTE — Anesthesia Procedure Notes (Signed)
Spinal  Patient location during procedure: OR Start time: 12/16/2011 7:45 AM Staffing Anesthesiologist: Azell Der Performed by: anesthesiologist  Preanesthetic Checklist Completed: patient identified, site marked, surgical consent, pre-op evaluation, timeout performed, IV checked, risks and benefits discussed and monitors and equipment checked Spinal Block Patient position: sitting Prep: Betadine Patient monitoring: heart rate, continuous pulse ox and blood pressure Location: L3-4 Injection technique: single-shot Needle Needle type: Sprotte  Needle gauge: 24 G Needle length: 9 cm Additional Notes Expiration date of kit checked and confirmed. Patient tolerated procedure well, without complications. No heme. No paresthesia

## 2011-12-16 NOTE — H&P (View-Only) (Signed)
NAMEBENARD, Mckinney                ACCOUNT NO.:  [id number]  MEDICAL RECORD NO.:  [id number]  LOCATION:  [facility]                         FACILITY:  [facility]  PHYSICIAN:  Timothy Frankel. Charlann Mckinney, M.D.  DATE OF BIRTH:  August 17, 1950  DATE OF ADMISSION:  12/09/2011 DATE OF DISCHARGE:  12/09/2011                             HISTORY & PHYSICAL   DATE OF SURGERY:  December 16, 2011.  ADMITTING DIAGNOSIS:  Status post resection of infected total knee arthroplasty, left knee, with cement antibiotic spacer.  HISTORY OF PRESENT ILLNESS:  This is a 62 year old gentleman with a history of total knee arthroplasty with subsequent infection, removal of the implant and placement of antibiotic spacer, who has now undergone antibiotic therapy and is now scheduled for removal of antibiotic spacer and reimplantation of total knee arthroplasty.  The surgery, risks, benefits, and aftercare were discussed in detail, questions were invited and answered.  Note that the patient is a candidate for tranexamic acid and dexamethasone and will receive both at surgery.  He is planning on going home after surgery.  He is given his home medications of aspirin, Robaxin, iron, MiraLax, and Colace.  His medical doctor is Dr. Alwyn Mckinney.  PAST MEDICAL HISTORY:  Drug allergy to PENICILLIN with swelling and rash.  CURRENT MEDICATIONS: 1. Allopurinol 300 mg 1 daily. 2. Benadryl 25 mg 1 q.6-8 h. p.r.n. itching. 3. Celebrex 200 mg 1 daily. 4. Metoprolol succinate 50 mg tablet 1 daily. 5. Potassium citrate 10 mEq 1 daily. 6. Sodium bicarbonate 650 mg tablet 1 t.i.d.  SERIOUS MEDICAL ILLNESSES:  Include hypertension, varicose veins, uric acid kidney stones, and osteoarthritis.  PREVIOUS SURGERIES:  Include arthroscopy with partial meniscectomy, knee replacement in 2011, removal of total knee arthroplasty in 2013 with placement of antibiotic spacer.  FAMILY HISTORY:  Positive for coronary artery disease.  SOCIAL HISTORY:  The patient is  married.  He does not smoke.  Drinks approximately 3 drinks per day, and again is planning on going home after surgery.  REVIEW OF SYSTEMS:  CENTRAL NERVOUS SYSTEM:  Negative for headache, blurred vision, or dizziness.  PULMONARY:  Negative for shortness of breath, PND, and orthopnea.  CARDIOVASCULAR:  Positive for hypertension. Negative for chest pain or palpitation.  GI:  Negative for ulcers, hepatitis.  GU:  Positive for history of uric acid kidney stones. MUSCULOSKELETAL:  Positive as in HPI.  PHYSICAL EXAMINATION:  GENERAL:  This is a well-developed, well- nourished gentleman, in no acute distress. VITAL SIGNS:  Blood pressure 141/78, pulse 72 and regular, respirations 14. HEENT:  Head normocephalic.  Nose patent.  Ears patent.  Pupils equal, round, and reactive to light.  Throat without injection. NECK:  Supple without adenopathy.  Carotids 2+ without bruit. CHEST:  Clear to auscultation.  No rales or rhonchi.  Respirations 14. HEART:  Regular rate and rhythm at 72 beats per minute without murmur. ABDOMEN:  Soft with active bowel sounds.  No masses or organomegaly. NEUROLOGIC:  The patient is alert and oriented to time, place, and person.  Cranial nerves II through XII are grossly intact. EXTREMITIES:  Show left knee with a well-healed anterior total knee arthroplasty scar.  His knee is not warm  or red.  There is moderate soft tissue swelling.  He has 5 degrees in full extension, further flexion to about 80 degrees today with his antibiotic spacer in place. Neurovascular status intact.  IMPRESSION:  Status post total knee arthroplasty with infection and antibiotic spacer placement.  PLAN:  Removal of antibiotic spacer and reimplantation total knee arthroplasty.     Timothy Mckinney, P.A.   ______________________________ Timothy Mckinney, M.D.    SJC/MEDQ  D:  12/11/2011  T:  12/12/2011  Job:  [id number]

## 2011-12-16 NOTE — Anesthesia Postprocedure Evaluation (Signed)
  Anesthesia Post-op Note  Patient: Timothy Mckinney  Procedure(s) Performed: Procedure(s) (LRB): REIMPLANTATION OF TOTAL KNEE (Left)  Patient Location: PACU  Anesthesia Type: Spinal  Level of Consciousness: awake and alert   Airway and Oxygen Therapy: Patient Spontanous Breathing  Post-op Pain: mild  Post-op Assessment: Post-op Vital signs reviewed, Patient's Cardiovascular Status Stable, Respiratory Function Stable, Patent Airway and No signs of Nausea or vomiting  Post-op Vital Signs: stable  Complications: No apparent anesthesia complications. Moving both feet. No complaints.

## 2011-12-16 NOTE — Anesthesia Preprocedure Evaluation (Addendum)
Anesthesia Evaluation  Patient identified by MRN, date of birth, ID band Patient awake    Reviewed: Allergy & Precautions, H&P , NPO status , Patient's Chart, lab work & pertinent test results  Airway Mallampati: II TM Distance: >3 FB Neck ROM: Full    Dental No notable dental hx.    Pulmonary sleep apnea ,  CXR; 10/16/11: PICC, no acute disease. breath sounds clear to auscultation  Pulmonary exam normal       Cardiovascular hypertension, Pt. on home beta blockers Rhythm:Regular Rate:Normal  ECG: RSR in v1, probably normal   Neuro/Psych negative neurological ROS  negative psych ROS   GI/Hepatic GERD-  ,H/O elevated liver enzymes.   Endo/Other  Morbid obesity  Renal/GU negative Renal ROS  negative genitourinary   Musculoskeletal negative musculoskeletal ROS (+)   Abdominal   Peds negative pediatric ROS (+)  Hematology negative hematology ROS (+)   Anesthesia Other Findings   Reproductive/Obstetrics negative OB ROS                          Anesthesia Physical Anesthesia Plan  ASA: III  Anesthesia Plan: Spinal   Post-op Pain Management:    Induction: Intravenous  Airway Management Planned:   Additional Equipment:   Intra-op Plan:   Post-operative Plan: Extubation in OR  Informed Consent: I have reviewed the patients History and Physical, chart, labs and discussed the procedure including the risks, benefits and alternatives for the proposed anesthesia with the patient or authorized representative who has indicated his/her understanding and acceptance.   Dental advisory given  Plan Discussed with: CRNA  Anesthesia Plan Comments: (Discussed risks/benefits of spinal including headache, backache, failure, bleeding, infection, and nerve damage. Patient consents to spinal. Questions answered. Coagulation studies and platelet count acceptable. Had a long discussion with patient regarding  risks/benefits of spinal versus general with or without femoral nerve block.. Dr. Charlann Boxer also discussed anesthesia with patient, and he consents to spinal.)      Anesthesia Quick Evaluation

## 2011-12-16 NOTE — Interval H&P Note (Signed)
History and Physical Interval Note:  12/16/2011 7:19 AM  Timothy Mckinney  has presented today for surgery, with the diagnosis of status post implantation of cement spacers  The various methods of treatment have been discussed with the patient and family. After consideration of risks, benefits and other options for treatment, the patient has consented to  Procedure(s) (LRB): LEFT REIMPLANTATION OF TOTAL KNEE (Left) as a surgical intervention .  The patients' history has been reviewed, patient examined, no change in status, stable for surgery.  I have reviewed the patients' chart and labs.  Questions were answered to the patient's satisfaction.     Shelda Pal

## 2011-12-16 NOTE — Progress Notes (Signed)
Did not give patient the allopurinol, restasis, or metoprolol for this afternoon. Pt reported that he takes these meds in the morning and he did take these this morning before he left. Pt also states that he does not want to take the sodium bicarbonate while he is in the hospital. Pt states, "It does not digest well."

## 2011-12-16 NOTE — Plan of Care (Signed)
Problem: Consults Goal: Diagnosis- Total Joint Replacement Revision Total Knee     

## 2011-12-16 NOTE — Brief Op Note (Signed)
12/16/2011  [facility]  MRN: [id number] CSN: [id number]  9:57 AM  PATIENT:  Timothy Mckinney  61 y.o. male  PRE-OPERATIVE DIAGNOSIS:  status post resection of infected Left TKR with implantation of cement spacers  POST-OPERATIVE DIAGNOSIS:  status post resection of infected Left TKR with implantation of cement spacers  PROCEDURE:  Procedure(s) (LRB): REVISION LEFT TOTAL KNEE REPLACEMENT, REIMPLANTATION OF TOTAL KNEE (Left)  SURGEON:  Surgeon(s) and Role:    * Shelda Pal, MD - Primary  PHYSICIAN ASSISTANT: Lanney Gins, PA-C  ANESTHESIA:   spinal  EBL:  Total I/O In: 2119.7 [I.V.:2000; IV Piggyback:119.7] Out: 350 [Urine:300; Blood:50]  BLOOD ADMINISTERED:none  DRAINS: (1 medium) Hemovact drain(s) in the left knee with  Suction Open   LOCAL MEDICATIONS USED:  MARCAINE    and  toradol Amount: 60 ml  SPECIMEN:  No Specimen  DISPOSITION OF SPECIMEN:  N/A  COUNTS:  YES  TOURNIQUET:   Total Tourniquet Time Documented: Thigh (Left) - 80 minutes  DICTATION: .Other Dictation: Dictation Number [id number]  PLAN OF CARE: Admit to inpatient   PATIENT DISPOSITION:  PACU - hemodynamically stable.   Delay start of Pharmacological VTE agent (>24hrs) due to surgical blood loss or risk of bleeding: no

## 2011-12-16 NOTE — Transfer of Care (Signed)
Immediate Anesthesia Transfer of Care Note  Patient: Timothy Mckinney  Procedure(s) Performed: Procedure(s) (LRB): REIMPLANTATION OF TOTAL KNEE (Left)  Patient Location: PACU  Anesthesia Type: MAC and Spinal  Level of Consciousness: awake, alert , patient cooperative and responds to stimulation, spinal T8  Airway & Oxygen Therapy: Patient Spontanous Breathing and Patient connected to face mask  Post-op Assessment: Report given to PACU RN and Post -op Vital signs reviewed and stable  Post vital signs: Reviewed and stable  Complications: No apparent anesthesia complications

## 2011-12-16 NOTE — Preoperative (Signed)
Beta Blockers   Reason not to administer Beta Blockers:Not Applicable, Pt took home BB this am

## 2011-12-17 LAB — CBC
HCT: 36.4 % — ABNORMAL LOW (ref 39.0–52.0)
MCH: 31.7 pg (ref 26.0–34.0)
MCV: 94.5 fL (ref 78.0–100.0)
Platelets: 191 10*3/uL (ref 150–400)
RDW: 13.7 % (ref 11.5–15.5)
WBC: 7.3 10*3/uL (ref 4.0–10.5)

## 2011-12-17 LAB — BASIC METABOLIC PANEL
BUN: 19 mg/dL (ref 6–23)
CO2: 24 mEq/L (ref 19–32)
Calcium: 9 mg/dL (ref 8.4–10.5)
Chloride: 100 mEq/L (ref 96–112)
Creatinine, Ser: 0.95 mg/dL (ref 0.50–1.35)
GFR calc Af Amer: >90 mL/min (ref >90)

## 2011-12-17 MED ORDER — KETOROLAC TROMETHAMINE 15 MG/ML IJ SOLN
15.0000 mg | Freq: Four times a day (QID) | INTRAMUSCULAR | Status: DC
  Administered 2011-12-17 (×2): 15 mg via INTRAVENOUS
  Filled 2011-12-17 (×6): qty 1

## 2011-12-17 NOTE — Op Note (Signed)
NAMEJASHAUN, PENROSE NO.:  [id number]  MEDICAL RECORD NO.:  [id number]  LOCATION:  1606                         FACILITY:  [facility]  PHYSICIAN:  Madlyn Frankel. Charlann Boxer, M.D.  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  PROCEDURE: 1. Removal of antibiotic spacer. 2. Reimplantation/revision of left total knee replacement utilizing     DePuy rotating platform posterior stabilized knee system with a     size 5 femur with a +2 adapter 5 degree bolt, 13 x 60 mm cemented     stem, with 4 mm distal medial and lateral and 4 mm posterior medial     and lateral augments.  The tibia side with a size 4 MBT revision     tray with a 45 press-fit stem, a 13 x 30 cemented stem, and a 12.5     mm posterior stabilized insert, and a 41 patellar button.  SURGEON:  Madlyn Frankel. Charlann Boxer, M.D.  ASSISTANT:  Lanney Gins, PA  Please note that Mr. Carmon Sails was present for the entirety of the case, utilized for control of the left lower extremity retractors, of critical soft tissue structures, general facilitation of the case and primary wound closure.  ANESTHESIA:  Spinal.  SPECIMEN:  None.  DRAINS:  One medium Hemovac.  COMPLICATIONS:  None.  TOURNIQUET TIME:  79 minutes at 250 mmHg.  INDICATION FOR THE PROCEDURE:  Mr. Keesling is a 61 year old gentleman with a history of a left total knee replacement almost 2 years ago.  This was complicated by new onset infection.  He had resection of his left total knee replacement, placement of an antibiotic spacer about 2 months ago.  He was treated with IV antibiotics for 6 weeks.  He was seen in the office without a  concern for clinical wound infection.  The patient was then scheduled for a left total knee reimplantation after review of risks and benefits including recurrence, infection, DVT, component failure, and need for revision surgery.  PROCEDURE IN DETAIL:  The patient was brought to the operative theater. Once adequate anesthesia, preoperative antibiotics, Ancef 2 g were administered he was positioned supine with a left thigh tourniquet placed.  The left lower extremity was then prepped and draped in a sterile fashion utilizing the [facility] leg holder.  The left leg was placed in the [facility] leg holder.  Time-out was performed identifying the patient, planned procedure, and extremity. The leg was exsanguinated, tourniquet elevated to 250 mmHg.  His old incision was utilized, soft tissue planes created.  A median arthrotomy was then made encountering more of a seromatous type fluid as opposed to anything concerning for infection.  At this point, we proceeded,  the knee flexed easily as this was an articulating spacer.  The previous cemented spacer was removed without difficulty.  Following debridement, exposure of the medial, lateral and suprapatellar region, attention was first  directed to the femoral and tibial canals.  I reamed up to 15 mm, both the canals to provide an area for cemented stems, also to clean the canal was out.  Attention was first directed to the tibia side.  In order to maintain orientation I placed a 60 mm trial cement stem on to the broaches for sleeves.  I broached from a 28 to a 45 at which point I had good purchase.  I confirmed that this was now positioned perpendicular.  There was very little bone that needed to be resected at this level.  This appeared to be proud based on removal of the previous components and loss of bone.  This was held in position based on its fit in the proximal tibia and was utilized to set orientation for the femur.  I chose to use a size  4 tibial tray.  On the femoral side, a size 5 seemed the fit best, medial and lateral. Also it improved with my flexion gap posteriorly.  The size 5 cutting block was then positioned using the proximal tibia tray down with set rotation as it was confirmed to be perpendicular. This was pinned in position with a +2 adapter bolt.  I removed very minimal bone off the distal femur medially and laterally.  The previous implant was a 15 mm insert . There was minimal bone to be resected off the anterior chamfer. Posteriorly I chose to resect for 4 mm augments medial and lateral.  It was more snug on the lateral aspect, but I chose not to resect up to the size 8 posterior medial. At this point, chamfer cuts were made and I cut for a posterior stabilized insert.  At this point, I did a trial reduction first with a 5 femur post non TC3 femur with a 5 degree bolt +2 adapter.  I did this with a 60 mm stem and posterior medial and lateral 4 mm augments.  I felt there was a little bit of hyperextension and thus switched the trial out to a TC3 component with 4 mm distal, medial and lateral augments as well as posterior augments.  With this the knee was symmetric from extension to flexion.  At this point, all trial components were removed.  Following irrigating the canal out with a pulse lavage canal irrigator, I placed cement restrictors in the proximal tibia and distal femur.  I debrided out any other fibrinous material and irrigated the knee out.  The final components were then opened on the back table.  Once these were done and confirmed in orientation including the proximal tibia sleeve and the augments the cement was mixed.  Four batches of gentamicin impregnated cement were used.  The tibial component was cemented in first followed by the femur.  The knee was brought to extension with a 12.5 insert and held in extension until the cement cured.  Excess cement was removed. During this  process the patellar component was cemented in place as well.  Once the cement had fully cured, excess cement was removed throughout the knee.  The final size 12.5 posterior stabilized insert to match the 5 femur was inserted.  The knee was re-irrigated.  Tourniquet was let down after 79 minutes.  No significant hemostasis required.  Extensor mechanism was reapproximated using a combination of 1 Vicryl and a running 0 V-Loc suture.  The skin was closed with 2-0 Vicryl, and staples on the skin.  The skin was cleaned, dried and dressed sterilely using a  bulky sterile wrap with a Xeroform and a bulky wrap.  He was then brought to the recovery room, stable, tolerating the procedure well.     Madlyn Frankel Charlann Boxer, M.D.     MDO/MEDQ  D:  12/16/2011  T:  12/17/2011  Job:  [id number]

## 2011-12-17 NOTE — Progress Notes (Signed)
Physical Therapy Treatment Patient Details Name: Timothy Mckinney MRN: [id number] DOB: 01-Nov-1950 Today's Date: 12/17/2011 Time: 4098-1191 PT Time Calculation (min): 21 min  PT Assessment / Plan / Recommendation Comments on Treatment Session       Follow Up Recommendations  Home health PT    Barriers to Discharge        Equipment Recommendations  None recommended by PT    Recommendations for Other Services    Frequency 7X/week   Plan Discharge plan remains appropriate    Precautions / Restrictions Precautions Precautions: Knee Knee Immobilizer - Left: Discontinue once straight leg raise with < 10 degree lag Restrictions Weight Bearing Restrictions: No Other Position/Activity Restrictions: WBAT   Pertinent Vitals/Pain 4-5/10    Mobility  Transfers Transfers: Sit to Stand;Stand to Sit Sit to Stand: 4: Min guard Stand to Sit: 4: Min guard Details for Transfer Assistance: min cues for use of UEs Ambulation/Gait Ambulation/Gait Assistance: 4: Min guard Ambulation Distance (Feet): 385 Feet Assistive device: Rolling walker Ambulation/Gait Assistance Details: min cues for position from RW Gait Pattern: Step-to pattern;Step-through pattern    Exercises     PT Diagnosis:    PT Problem List:   PT Treatment Interventions:     PT Goals Acute Rehab PT Goals PT Goal Formulation: With patient Time For Goal Achievement: 12/20/11 Potential to Achieve Goals: Good Pt will go Supine/Side to Sit: with supervision PT Goal: Supine/Side to Sit - Progress: Goal set today Pt will go Sit to Supine/Side: with supervision PT Goal: Sit to Supine/Side - Progress: Goal set today Pt will go Sit to Stand: with supervision PT Goal: Sit to Stand - Progress: Progressing toward goal Pt will go Stand to Sit: with supervision PT Goal: Stand to Sit - Progress: Progressing toward goal Pt will Ambulate: >150 feet;with supervision;with rolling walker PT Goal: Ambulate - Progress: Progressing toward  goal Pt will Go Up / Down Stairs: 3-5 stairs;with min assist;with least restrictive assistive device PT Goal: Up/Down Stairs - Progress: Goal set today  Visit Information  Last PT Received On: 12/17/11 Assistance Needed: +1    Subjective Data  Patient Stated Goal: Resume previous lifestyle asap   Cognition  Overall Cognitive Status: Appears within functional limits for tasks assessed/performed Arousal/Alertness: Awake/alert Orientation Level: Appears intact for tasks assessed Behavior During Session: [facility] for tasks performed    Balance     End of Session PT - End of Session Activity Tolerance: Patient tolerated treatment well Patient left: in chair;with call bell/phone within reach;with family/visitor present Nurse Communication: Mobility status    Timothy Mckinney 12/17/2011, 4:19 PM

## 2011-12-17 NOTE — Progress Notes (Signed)
OT Note:  Pt screened for OT.  He has had previous knee surgeries and has assist and DME.  [place], [place] [phone] 12/17/2011

## 2011-12-17 NOTE — Evaluation (Signed)
Physical Therapy Evaluation Patient Details Name: Timothy Mckinney MRN: [id number] DOB: [birth year] Today's Date: 12/17/2011 Time: 7829-5621 PT Time Calculation (min): 50 min  PT Assessment / Plan / Recommendation Clinical Impression  Pt with LTKR presents with decreased L LE strength/ROM and limitations to functional mobility    PT Assessment  Patient needs continued PT services    Follow Up Recommendations  Home health PT    Barriers to Discharge        lEquipment Recommendations  None recommended by PT    Recommendations for Other Services     Frequency 7X/week    Precautions / Restrictions Precautions Precautions: Knee Required Braces or Orthoses: Knee Immobilizer - Left Knee Immobilizer - Left: Discontinue once straight leg raise with < 10 degree lag (Pt performed IND SLR this date) Restrictions Weight Bearing Restrictions: No Other Position/Activity Restrictions: WBAT   Pertinent Vitals/Pain 4/10; premedicated; cold packs provided      Mobility  Bed Mobility Bed Mobility: Supine to Sit Supine to Sit: 4: Min assist Details for Bed Mobility Assistance: min cues for use of R LE to self assist Transfers Transfers: Sit to Stand;Stand to Sit Sit to Stand: 4: Min assist Stand to Sit: 4: Min assist Details for Transfer Assistance: min cues for use of UEs Ambulation/Gait Ambulation/Gait Assistance: 4: Min guard Ambulation Distance (Feet): 100 Feet (100' and 25') Assistive device: Rolling walker Ambulation/Gait Assistance Details: min cues for position from RW Gait Pattern: Step-to pattern;Step-through pattern    Exercises Total Joint Exercises Ankle Circles/Pumps: AROM;10 reps;Both;Supine Quad Sets: AROM;Both;10 reps;Supine Heel Slides: AAROM;Left;10 reps;Supine Straight Leg Raises: AROM;10 reps;Supine;Left   PT Diagnosis: Difficulty walking  PT Problem List: Decreased strength;Decreased range of motion;Decreased activity tolerance;Obesity;Pain;Decreased  mobility PT Treatment Interventions: DME instruction;Gait training;Stair training;Functional mobility training;Therapeutic activities;Therapeutic exercise;Patient/family education   PT Goals Acute Rehab PT Goals PT Goal Formulation: With patient Time For Goal Achievement: 12/20/11 Potential to Achieve Goals: Good Pt will go Supine/Side to Sit: with supervision PT Goal: Supine/Side to Sit - Progress: Goal set today Pt will go Sit to Supine/Side: with supervision PT Goal: Sit to Supine/Side - Progress: Goal set today Pt will go Sit to Stand: with supervision PT Goal: Sit to Stand - Progress: Goal set today Pt will go Stand to Sit: with supervision PT Goal: Stand to Sit - Progress: Goal set today Pt will Ambulate: >150 feet;with supervision;with rolling walker PT Goal: Ambulate - Progress: Goal set today Pt will Go Up / Down Stairs: 3-5 stairs;with min assist;with least restrictive assistive device PT Goal: Up/Down Stairs - Progress: Goal set today  Visit Information  Last PT Received On: 12/17/11 Assistance Needed: +1    Subjective Data  Patient Stated Goal: Resume previous lifestyle asap   Prior Functioning  Home Living Lives With: Spouse Available Help at Discharge: Family Type of Home: House Home Access: Stairs to enter Secretary/administrator of Steps: 4 Entrance Stairs-Rails: Right Home Layout: Two level;Able to live on main level with bedroom/bathroom Alternate Level Stairs-Number of Steps: 16 Alternate Level Stairs-Rails: Right Home Adaptive Equipment: Walker - rolling Prior Function Level of Independence: Independent with assistive device(s) Able to Take Stairs?: Yes Communication Communication: No difficulties    Cognition  Overall Cognitive Status: Appears within functional limits for tasks assessed/performed Arousal/Alertness: Awake/alert Orientation Level: Appears intact for tasks assessed Behavior During Session: [facility] for tasks performed    Extremity/Trunk  Assessment Right Upper Extremity Assessment RUE ROM/Strength/Tone: Within functional levels Left Upper Extremity Assessment LUE ROM/Strength/Tone: Within functional levels Right  Lower Extremity Assessment RLE ROM/Strength/Tone: WFL for tasks assessed Left Lower Extremity Assessment LLE ROM/Strength/Tone: Deficits LLE ROM/Strength/Tone Deficits: AAROM at knee -10 - 70 with 3/5 quad strength   Balance    End of Session PT - End of Session Activity Tolerance: Patient tolerated treatment well Patient left: in chair;with call bell/phone within reach Nurse Communication: Mobility status   [name] 12/17/2011, 9:34 AM

## 2011-12-17 NOTE — Progress Notes (Signed)
  Subjective: 1 Day Post-Op Procedure(s) (LRB): REIMPLANTATION OF TOTAL KNEE (Left)   Patient reports pain as mild, well controlled when stays on top of the pain. No events throughout the night.  Objective:   VITALS:   Filed Vitals:   12/17/11 0751  BP: 130/76  Pulse: 79  Temp: 97.7 F (36.5 C)   Resp: 16    Neurovascular intact Dorsiflexion/Plantar flexion intact Incision: dressing C/D/I No cellulitis present Compartment soft  LABS  Basename 12/17/11 0422  HGB 12.2*  HCT 36.4*  WBC 7.3  PLT 191     Basename 12/17/11 0422  NA 134*  K 4.6  BUN 19  CREATININE 0.95  GLUCOSE 134*     Assessment/Plan: 1 Day Post-Op Procedure(s) (LRB): REIMPLANTATION OF TOTAL KNEE (Left)   HV drain d/c'ed Foley cath d/c'ed Advance diet Up with therapy D/C IV fluids Plan for discharge tomorrow to home if he continues to do well.   Anastasio Auerbach Ernan Runkles   PAC  12/17/2011, 8:43 AM

## 2011-12-18 LAB — BASIC METABOLIC PANEL
BUN: 19 mg/dL (ref 6–23)
CO2: 26 mEq/L (ref 19–32)
Calcium: 8.5 mg/dL (ref 8.4–10.5)
Creatinine, Ser: 1.11 mg/dL (ref 0.50–1.35)
Glucose, Bld: 95 mg/dL (ref 70–99)

## 2011-12-18 LAB — CBC
Hemoglobin: 10.4 g/dL — ABNORMAL LOW (ref 13.0–17.0)
MCH: 30.5 pg (ref 26.0–34.0)
MCV: 96.5 fL (ref 78.0–100.0)
RBC: 3.41 MIL/uL — ABNORMAL LOW (ref 4.22–5.81)

## 2011-12-18 MED ORDER — METHOCARBAMOL 500 MG PO TABS: 500.0000 mg | ORAL_TABLET | Freq: Four times a day (QID) | ORAL | Status: AC | PRN

## 2011-12-18 MED ORDER — HYDROCODONE-ACETAMINOPHEN 7.5-325 MG PO TABS: 1.0000 | ORAL_TABLET | ORAL | Status: AC | PRN

## 2011-12-18 MED ORDER — ASPIRIN EC 325 MG PO TBEC: 325.0000 mg | DELAYED_RELEASE_TABLET | Freq: Two times a day (BID) | ORAL | Status: DC

## 2011-12-18 MED ORDER — HYDROCODONE-ACETAMINOPHEN 7.5-325 MG PO TABS: 1.0000 | ORAL_TABLET | ORAL | Status: DC | PRN

## 2011-12-18 MED ORDER — RIVAROXABAN 10 MG PO TABS: 10.0000 mg | ORAL_TABLET | ORAL | Status: DC

## 2011-12-18 MED ORDER — ASPIRIN EC 325 MG PO TBEC: 325.0000 mg | DELAYED_RELEASE_TABLET | Freq: Two times a day (BID) | ORAL | Status: AC

## 2011-12-18 MED ORDER — POLYETHYLENE GLYCOL 3350 17 G PO PACK: 17.0000 g | PACK | Freq: Two times a day (BID) | ORAL | Status: AC

## 2011-12-18 MED ORDER — DSS 100 MG PO CAPS: 100.0000 mg | ORAL_CAPSULE | Freq: Two times a day (BID) | ORAL | Status: AC

## 2011-12-18 MED ORDER — FERROUS SULFATE 325 (65 FE) MG PO TABS: 325.0000 mg | ORAL_TABLET | Freq: Three times a day (TID) | ORAL | Status: DC

## 2011-12-18 NOTE — Progress Notes (Signed)
Pt for d/c home today with HHC-HHPT per [facility]. IV d/c'd. Dressing-Aquacel CDI to L knee. L knee incision noted with slight redness to surrounding tissue. Dressing supplies provided for home use. No changes in am assessments today. Pain well managed by oral pain med-refused scheduled IV Toradol. Pt  had refused some of meds during his stay in hospital. Ambulates to BR with RW & 1 assist. Awaiting for wife to pick & bring pt home & belongings.

## 2011-12-18 NOTE — Progress Notes (Signed)
  Subjective: 2 Days Post-Op Procedure(s) (LRB): REIMPLANTATION OF TOTAL KNEE (Left)   Patient reports pain as mild, pain well controlled with medication. No events throughout the night. Doing well with PT. Ready to be discharged home.  Objective:   VITALS:   Filed Vitals:   12/18/11 0800  BP: 138/71  Pulse: 92  Temp: 98.3 F (36.8 C)   Resp: 16    Neurovascular intact Dorsiflexion/Plantar flexion intact Incision: dressing C/D/I No cellulitis present Compartment soft  LABS  Basename 12/18/11 0347 12/17/11 0422  HGB 10.4* 12.2*  HCT 32.9* 36.4*  WBC 5.5 7.3  PLT 136* 191     Basename 12/18/11 0347 12/17/11 0422  NA 135 134*  K 4.0 4.6  BUN 19 19  CREATININE 1.11 0.95  GLUCOSE 95 134*     Assessment/Plan: 2 Days Post-Op Procedure(s) (LRB): REIMPLANTATION OF TOTAL KNEE (Left)   Up with therapy Discharge home with home health Follow up in 2 weeks at [facility].  Follow-up Information    Follow up with OLIN,Kriti Katayama D in 2 weeks.   Contact information:   [facility] [address], [address] [place] [address] [phone]          Anastasio Auerbach. Trestin Vences   PAC  12/18/2011, 9:25 AM

## 2011-12-18 NOTE — Progress Notes (Signed)
Physical Therapy Treatment Patient Details Name: Legrand Lasser MRN: [id number] DOB: Dec 22, 1950 Today's Date: 12/18/2011 Time: 0920-1015 PT Time Calculation (min): 55 min  PT Assessment / Plan / Recommendation Comments on Treatment Session  Pt has had multiple Knee sugeries and is very knowledgable.  Practiced up/down 4 steps  using one raikl and one crutch.  performed all supinr TKR TE's.  Pt plans to D/C to home with spouse today.    Follow Up Recommendations  Home health PT    Barriers to Discharge        Equipment Recommendations  None recommended by PT    Recommendations for Other Services    Frequency 7X/week   Plan Discharge plan remains appropriate    Precautions / Restrictions Restrictions Weight Bearing Restrictions: No   Pertinent Vitals/Pain "soreness"    Mobility  Bed Mobility Bed Mobility: Not assessed Details for Bed Mobility Assistance: Assisted pt back to bed to perform TKR TE's Transfers Transfers: Sit to Stand;Stand to Sit Sit to Stand: 5: Supervision;From chair/3-in-1 Stand to Sit: 5: Supervision;To bed Details for Transfer Assistance: One VC on turn completion for increased safety  Ambulation/Gait Ambulation/Gait Assistance: 5: Supervision Ambulation Distance (Feet): 450 Feet Assistive device: Rolling walker Ambulation/Gait Assistance Details: one VC on safety with turns using RW Gait Pattern: Step-to pattern;Decreased stance time - left Stairs: Yes Stairs Assistance: 5: Supervision Stairs Assistance Details (indicate cue type and reason): one VC on sequencing Stair Management Technique: One rail Left;With crutches Number of Stairs: 4  Wheelchair Mobility Wheelchair Mobility: No    Exercises Total Joint Exercises Ankle Circles/Pumps: AROM;Both;10 reps;Supine Quad Sets: AROM;Both;10 reps;Supine Gluteal Sets: Both;10 reps;Supine;AROM Towel Squeeze: AROM;Both;10 reps;Supine Short Arc Quad: AAROM;Left;10 reps;Supine Heel Slides: AAROM;Left;10  reps;Supine Hip ABduction/ADduction: AAROM;Left;10 reps;Supine Straight Leg Raises: AAROM;Left;10 reps;Supine   PT Goals                  progressing    Visit Information  Last PT Received On: 12/18/11 Assistance Needed: +1    Subjective Data      Cognition    good   Balance   good  End of Session PT - End of Session Equipment Utilized During Treatment: Gait belt Activity Tolerance: Patient tolerated treatment well Patient left: in bed;with call bell/phone within reach;Other (comment) (ICE to L knee) Nurse Communication: Other (comment) (Pt ready for D/C to home)    Felecia Shelling  PTA [facility]  Acute  Rehab Pager     [phone]

## 2011-12-18 NOTE — Care Management Note (Signed)
    Page 1 of 2   12/18/2011     5:36:07 PM   CARE MANAGEMENT NOTE 12/18/2011  Patient:  Timothy Mckinney,Timothy Mckinney   Account Number:  [id number]  Date Initiated:  12/17/2011  Documentation initiated by:  Colleen Can  Subjective/Objective Assessment:   dx infected left total knee; removal abx spacer, reimplantation/revision left total knee     Action/Plan:   CM spoke with patient. Plans are for patient to return to his home where spouse will be caqregiver. Already has RW. Pt is requesting [facility], has used them in the past   Anticipated DC Date:  12/18/2011   Anticipated DC Plan:  HOME W HOME HEALTH SERVICES  In-house referral  NA      DC Planning Services  CM consult      [facility] Choice  HOME HEALTH   Choice offered to / List presented to:  C-1 Patient   DME arranged  NA      DME agency  NA     HH arranged  HH-2 PT      HH agency  [facility]   Status of service:  Completed, signed off Medicare Important Message given?  NO (If response is "NO", the following Medicare IM given date fields will be blank) Date Medicare IM given:   Date Additional Medicare IM given:    Discharge Disposition:  HOME W HOME HEALTH SERVICES  Per UR Regulation:  Reviewed for med. necessity/level of care/duration of stay  If discussed at Long Length of Stay Meetings, dates discussed:    Comments:  12/17/2011 Raynelle Bring BSN CCM [phone] List of [facility] agencies placed in shadow chart. [facility] can provide services with start date of day after discharge.

## 2011-12-20 NOTE — Discharge Summary (Signed)
Physician Discharge Summary  Patient ID: Timothy Mckinney MRN: [id number] DOB/AGE: [birth year] 61 y.o.  Admit date: 12/16/2011 Discharge date: 12/18/2011  Procedures:  Procedure(s) (LRB): REIMPLANTATION OF TOTAL KNEE (Left)  Attending Physician:  Dr. Durene Romans   Admission Diagnoses: Status post resection of infected total knee arthroplasty, left knee, with cement antibiotic spacer  Discharge Diagnoses:  Principal Problem:  *S/P left knee revision / reimplantation Hypertension Varicose veins Uric acid kidney stones Osteoarthritis  HPI: This is a 61 year old gentleman with a history of total knee arthroplasty with subsequent infection, removal of the implant and placement of antibiotic spacer, who has now undergone antibiotic therapy and is now scheduled for removal of antibiotic spacer and reimplantation of total knee arthroplasty. The surgery, risks, benefits, and aftercare were discussed in detail, questions were invited and answered. Note that the patient is a candidate for tranexamic acid and dexamethasone and will receive both at surgery. He is planning on going home after surgery. He is given his home medications of aspirin, Robaxin, iron, MiraLax, and Colace. His medical doctor is Dr. Alwyn Ren.   PCP: Marga Melnick, MD   Discharged Condition: good  Hospital Course:  Patient underwent the above stated procedure on 12/16/2011. Patient tolerated the procedure well and brought to the recovery room in good condition and subsequently to the floor.  POD #1 BP: 130/76 ; Pulse: 79 ; Temp: 97.7 F (36.5 C) ; Resp: 16  Pt's foley was removed, as well as the hemovac drain removed. IV was changed to a saline lock. Patient reports pain as mild, well controlled when stays on top of the pain. No events throughout the night. Neurovascular intact, dorsiflexion/plantar flexion intact, incision: dressing C/D/I, no cellulitis present and compartment soft.   LABS  Basename  12/17/11 0422   HGB   12.2  HCT  36.4   POD #2  BP: 138/71 ; Pulse: 92 ; Temp: 98.3 F (36.8 C) ; Resp: 16  Patient reports pain as mild, pain well controlled with medication. No events throughout the night. Doing well with PT. Ready to be discharged home. Neurovascular intact, dorsiflexion/plantar flexion intact, incision: dressing C/D/I, no cellulitis present and compartment soft.   LABS  Basename  12/18/11 0347  HGB  10.4  HCT  32.9    Discharge Exam: General appearance: alert, cooperative and no distress Extremities: Homans sign is negative, no sign of DVT, no edema, redness or tenderness in the calves or thighs and no ulcers, gangrene or trophic changes  Disposition:  Home  with follow up in 2 weeks   Follow-up Information    Schedule an appointment as soon as possible for a visit with Shelda Pal, MD.   Contact information:   [facility] [address], [address] [place] [address] [phone]          Discharge Orders    Future Orders Please Complete By Expires   Diet - low sodium heart healthy      Call MD / Call 911      Comments:   If you experience chest pain or shortness of breath, CALL 911 and be transported to the hospital emergency room.  If you develope a fever above 101 F, pus (white drainage) or increased drainage or redness at the wound, or calf pain, call your surgeon's office.   Discharge instructions      Comments:   Maintain surgical dressing for 8 days, then replace with gauze and tape. Keep the area dry and clean until follow  up. Follow up in 2 weeks at [facility]. Call with any questions or concerns.   Constipation Prevention      Comments:   Drink plenty of fluids.  Prune juice may be helpful.  You may use a stool softener, such as Colace (over the counter) 100 mg twice a day.  Use MiraLax (over the counter) for constipation as needed.   Increase activity slowly as tolerated      Driving restrictions      Comments:    No driving for 4 weeks   TED hose      Comments:   Use stockings (TED hose) for 2 weeks on both leg(s).  You may remove them at night for sleeping.   Change dressing      Comments:   Maintain surgical dressing for 8 days, then change the dressing daily with sterile 4 x 4 inch gauze dressing and tape. Keep the area dry and clean.      Discharge Medication List as of 12/18/2011 11:39 AM    START taking these medications   Details  docusate sodium 100 MG CAPS Take 100 mg by mouth 2 (two) times daily., Starting 12/18/2011, Until Sat 12/28/11, No Print    ferrous sulfate 325 (65 FE) MG tablet Take 1 tablet (325 mg total) by mouth 3 (three) times daily after meals., Starting 12/18/2011, Until Thu 12/17/12, No Print    methocarbamol (ROBAXIN) 500 MG tablet Take 1 tablet (500 mg total) by mouth every 6 (six) hours as needed (muscle spasms)., Starting 12/18/2011, Until Sat 12/28/11, No Print    polyethylene glycol (MIRALAX / GLYCOLAX) packet Take 17 g by mouth 2 (two) times daily., Starting 12/18/2011, Until Sat 12/21/11, No Print      CONTINUE these medications which have CHANGED   Details  aspirin EC 325 MG tablet Take 1 tablet (325 mg total) by mouth 2 (two) times daily. X 4 weeks, Starting 12/18/2011, Until Sat 12/28/11, No Print    HYDROcodone-acetaminophen (NORCO) 7.5-325 MG per tablet Take 1-2 tablets by mouth every 4 (four) hours as needed for pain., Starting 12/18/2011, Until Sat 12/28/11, Print      CONTINUE these medications which have NOT CHANGED   Details  allopurinol (ZYLOPRIM) 300 MG tablet Take 300 mg by mouth daily., Until Discontinued, Historical Med    celecoxib (CELEBREX) 200 MG capsule Take 200 mg by mouth daily., Until Discontinued, Historical Med    cycloSPORINE (RESTASIS) 0.05 % ophthalmic emulsion Place 1 drop into both eyes daily., Until Discontinued, Historical Med    metoprolol tartrate (LOPRESSOR) 25 MG tablet Take 25 mg by mouth daily., Starting 10/09/2011, Until Thu  10/08/12, Historical Med    diphenhydrAMINE (BENADRYL) 25 MG tablet Take 25 mg by mouth. ONE AT BEDTIME IF NEEDED SLEEP, Until Discontinued, Historical Med    potassium citrate (UROCIT-K) 10 MEQ (1080 MG) SR tablet Take 20 mEq by mouth 3 (three) times daily with meals., Until Discontinued, Historical Med    sodium bicarbonate 650 MG tablet Take 1,950 mg by mouth 3 (three) times daily., Until Discontinued, Historical Med      STOP taking these medications     Ciprofloxacin (CIPRO PO) Comments:  Reason for Stopping:       rivaroxaban (XARELTO) 10 MG TABS tablet Comments:  Reason for Stopping:           Signed: Anastasio Auerbach. Atlantis Delong   PAC  12/20/2011, 8:52 AM

## 2012-03-19 ENCOUNTER — Other Ambulatory Visit: Payer: Self-pay | Admitting: Gastroenterology

## 2012-03-23 ENCOUNTER — Ambulatory Visit (HOSPITAL_COMMUNITY)
Admission: RE | Admit: 2012-03-23 | Discharge: 2012-03-23 | Disposition: A | Discharge status: Home / Self Care | Payer: 59 | Source: Ambulatory Visit | Attending: Gastroenterology | Admitting: Gastroenterology

## 2012-03-23 DIAGNOSIS — R945 Abnormal results of liver function studies: Secondary | ICD-10-CM | POA: Insufficient documentation

## 2012-03-26 ENCOUNTER — Encounter (INDEPENDENT_AMBULATORY_CARE_PROVIDER_SITE_OTHER): Payer: Self-pay | Admitting: General Surgery

## 2012-03-26 ENCOUNTER — Other Ambulatory Visit (INDEPENDENT_AMBULATORY_CARE_PROVIDER_SITE_OTHER): Payer: Self-pay | Admitting: General Surgery

## 2012-03-26 ENCOUNTER — Ambulatory Visit (INDEPENDENT_AMBULATORY_CARE_PROVIDER_SITE_OTHER): Payer: Commercial Managed Care - PPO | Admitting: General Surgery

## 2012-03-26 VITALS — BP 138/81 | HR 102 | Temp 97.1°F | Resp 20 | Ht 71.5 in | Wt 296.8 lb

## 2012-03-26 DIAGNOSIS — K6289 Other specified diseases of anus and rectum: Secondary | ICD-10-CM

## 2012-03-26 DIAGNOSIS — K625 Hemorrhage of anus and rectum: Secondary | ICD-10-CM

## 2012-03-26 NOTE — Progress Notes (Signed)
Chief Complaint  Patient presents with  . Mass    colon    HISTORY: Timothy Mckinney is a 61 y.o. male who presents to the office with rectal bleeding.  Other symptoms include 3-4 BM's a day.  He was on abx for an infected knee and this made his symptoms better. Wiping makes the symptoms worse.  This had been occurring for at least 3 years.   His bowel habits are regular and his bowel movements are mixed.  His fiber intake is good.  His last colonoscopy was yesterday where some polyps were found in his anal canal and rectum.  He has noticed some prolapsing tissue that spontaneously reduces.  He has some minor leakage occasionally.    Past Medical History  Diagnosis Date  . Dry eyes, bilateral     Dr Hazle Quant  . Nephrolithiasis     Dr Aldean Ast  . Hyperuricemia   . Abnormal chest x-ray     ? granulomata  . Hypertension     NEW DIAGNOSIS AND STARTED ON METOPROLOL ON 10/04/11  . Elevated liver enzymes     HISTORY OF ELEVATED LIVER ENZYMES  . Thrombocytopenia     HX OF LOW PLATELET COUNT  . Sleep apnea     STOP BANG SCORE OF 4  . GERD (gastroesophageal reflux disease)     hx of   . Arthritis     knees and back       Past Surgical History  Procedure Date  . Cardiac catheterization     in 40s, negative  . Tear duct plugs   . Knee arthroscopy 2009     L  . Colonoscopy w/ polypectomy 2011    Dr Jarold Motto  . Colon ulcer 2011  . Total knee arthroplasty 2011    Workman's Comp case, Dr Ranell Patrick  . Lithotripsy   . Knee surgery     april 2013 total knee replacement removed        Current Outpatient Prescriptions  Medication Sig Dispense Refill  . allopurinol (ZYLOPRIM) 300 MG tablet Take 300 mg by mouth daily.      . cycloSPORINE (RESTASIS) 0.05 % ophthalmic emulsion Place 1 drop into both eyes daily.      . diphenhydrAMINE (BENADRYL) 25 MG tablet Take 25 mg by mouth. ONE AT BEDTIME IF NEEDED SLEEP      . metoprolol tartrate (LOPRESSOR) 25 MG tablet Take 25 mg by mouth daily.           Allergies  Allergen Reactions  . Penicillins Swelling    Diffuse swelling      Family History  Problem Relation Age of Onset  . Heart disease Father   . Cancer Paternal Grandfather     throat , pipe smoker  . Colon polyps Mother   . Dementia Mother     died post SDH    History   Social History  . Marital Status: Married    Spouse Name: N/A    Number of Children: N/A  . Years of Education: N/A   Social History Main Topics  . Smoking status: Never Smoker   . Smokeless tobacco: Never Used  . Alcohol Use: Yes      2-3 drinks / day on average  . Drug Use: No  . Sexually Active: None   Other Topics Concern  . None   Social History Narrative  . None      REVIEW OF SYSTEMS - PERTINENT POSITIVES ONLY: Review of Systems - General  ROS: negative for - chills, fever or malaise Hematological and Lymphatic ROS: negative for - bleeding problems, blood clots or bruising Respiratory ROS: no cough, shortness of breath, or wheezing Cardiovascular ROS: no chest pain or dyspnea on exertion Gastrointestinal ROS: no abdominal pain, change in bowel habits, or black or bloody stools Genito-Urinary ROS: no dysuria, trouble voiding, or hematuria  EXAM: Filed Vitals:   03/26/12 1410  BP: 138/81  Pulse: 102  Temp: 97.1 F (36.2 C)  Resp: 20    General appearance: alert, cooperative and no distress Resp: clear to auscultation bilaterally Cardio: regular rate and rhythm GI: soft, non-tender; bowel sounds normal; no masses,  no organomegaly Extremities: no edema, midline scar over L knee   Procedure: Anoscopy Surgeon: Maisie Fus Diagnosis: anal mass  Assistant: McPherson After the risks and benefits were explained, verbal consent was obtained for above procedure  Anesthesia: none Findings: posterior outpouching of rectal wall with visualization of anal sphincters, moderate sphincter tone   ASSESSMENT AND PLAN: Timothy Mckinney is a 61 y.o. male who was referred to me for anal  polypoid masses and symptoms of bleeding when he wipes.  He also complains of occasional anal leakage.  On exam it feels as if he has a outpouching of his rectal wall posteriorly.  Directly distal to this is his anal sphincter complex which appears to be denuded.  This could be due to a chronic anal fissure, but he states no history of pain with bowel movements or constipation.  I suppose it could also be a chronic cavity where his ulcer used to be.  The polypoid masses appear to be inflammatory masses, but we will await the pathology from the biopsies yesterday.  I have scheduled him for a CT of his pelvis to further delineate his anatomy.  I told him that I believe this cavity could be repaired with a mucosal advancement flap.  We will discuss this when he returns to clinic after his CT.  If the polyps are adenomatous, then he will need a transanal excision.        Vanita Panda, MD Colon and Rectal Surgery / General Surgery [facility]      Visit Diagnoses: 1. Rectal bleeding     Primary Care Physician: Marga Melnick, MD

## 2012-03-26 NOTE — Patient Instructions (Signed)
Your rectal bleeding appears to be caused by inflammatory polyps.  We will wait for pathology results of the biopsies.  We will schedule a CT scan of your pelvis to further evaluate this.  Return to the office after this to discuss plans.  Continue your high fiber diet.

## 2012-03-31 ENCOUNTER — Encounter (INDEPENDENT_AMBULATORY_CARE_PROVIDER_SITE_OTHER): Payer: Self-pay

## 2012-04-08 ENCOUNTER — Telehealth (INDEPENDENT_AMBULATORY_CARE_PROVIDER_SITE_OTHER): Payer: Self-pay

## 2012-04-08 ENCOUNTER — Telehealth (INDEPENDENT_AMBULATORY_CARE_PROVIDER_SITE_OTHER): Payer: Self-pay | Admitting: General Surgery

## 2012-04-08 DIAGNOSIS — K625 Hemorrhage of anus and rectum: Secondary | ICD-10-CM

## 2012-04-08 NOTE — Telephone Encounter (Signed)
Diannia Ruder called to ask if they could do the ct pelvis with iv contrast to rule out the abscess.  I got the ok from Dr Maisie Fus and will change the order.

## 2012-04-08 NOTE — Telephone Encounter (Signed)
The patient wanted to go to [facility] so I set him up for tomorrow at [facility] arrive at 10:45.  He needs to drink one bottle of contrast at 9am and one at 10am.

## 2012-04-08 NOTE — Telephone Encounter (Signed)
Pt called back today and stated that he can not go to the [facility] to do the ct that he will have to pay out of pocket over 400 to 500 dollars. He wants to go the hospital to have his done. Please call he back on [phone]

## 2012-04-09 ENCOUNTER — Inpatient Hospital Stay: Admission: RE | Admit: 2012-04-09 | Payer: 59 | Source: Ambulatory Visit

## 2012-04-09 ENCOUNTER — Ambulatory Visit (HOSPITAL_COMMUNITY)
Admission: RE | Admit: 2012-04-09 | Discharge: 2012-04-09 | Disposition: A | Discharge status: Home / Self Care | Payer: 59 | Source: Ambulatory Visit | Attending: General Surgery | Admitting: General Surgery

## 2012-04-09 DIAGNOSIS — K625 Hemorrhage of anus and rectum: Secondary | ICD-10-CM | POA: Insufficient documentation

## 2012-04-09 DIAGNOSIS — K6289 Other specified diseases of anus and rectum: Secondary | ICD-10-CM | POA: Insufficient documentation

## 2012-04-09 MED ORDER — IOHEXOL 300 MG/ML  SOLN
100.0000 mL | Freq: Once | INTRAMUSCULAR | Status: AC | PRN
  Administered 2012-04-09: 100 mL via INTRAVENOUS

## 2012-04-15 ENCOUNTER — Ambulatory Visit (INDEPENDENT_AMBULATORY_CARE_PROVIDER_SITE_OTHER): Payer: Commercial Managed Care - PPO | Admitting: General Surgery

## 2012-04-15 ENCOUNTER — Encounter (INDEPENDENT_AMBULATORY_CARE_PROVIDER_SITE_OTHER): Payer: Self-pay | Admitting: General Surgery

## 2012-04-15 VITALS — BP 114/60 | HR 86 | Temp 98.4°F | Ht 71.5 in | Wt 301.0 lb

## 2012-04-15 DIAGNOSIS — K626 Ulcer of anus and rectum: Secondary | ICD-10-CM

## 2012-04-15 NOTE — Patient Instructions (Addendum)
We will schedule you for surgery.  Continue the fiber.  We will plan on doing a mucosal advancement flap.

## 2012-04-16 NOTE — Progress Notes (Signed)
Timothy Mckinney is a 61 y.o. male who is here for a follow up visit regarding a large posterior ulcer.  His bm's are better with the fiber.  His still is having the drainage and leaking, no pain.  Objective: Filed Vitals:   04/15/12 1501  BP: 114/60  Pulse: 86  Temp: 98.4 F (36.9 C)    General appearance: alert, cooperative and no distress GI: soft, non-tender; bowel sounds normal; no masses,  no organomegaly   Assessment and Plan: Large posterior ulceration.  The risks and benefits of an operation were explained to the patient.  We discussed failure rates of about 10% due to the size.  We will proceed with eua and resection of polypoid lesions and possible mucosal advancement flap.    Vanita Panda, MD [facility], [place] [phone]

## 2012-04-24 ENCOUNTER — Encounter (INDEPENDENT_AMBULATORY_CARE_PROVIDER_SITE_OTHER): Payer: Commercial Managed Care - PPO | Admitting: General Surgery

## 2012-05-04 ENCOUNTER — Other Ambulatory Visit (INDEPENDENT_AMBULATORY_CARE_PROVIDER_SITE_OTHER): Payer: Self-pay

## 2012-05-04 DIAGNOSIS — K626 Ulcer of anus and rectum: Secondary | ICD-10-CM

## 2012-05-05 ENCOUNTER — Telehealth (INDEPENDENT_AMBULATORY_CARE_PROVIDER_SITE_OTHER): Payer: Self-pay | Admitting: General Surgery

## 2012-05-05 NOTE — Telephone Encounter (Signed)
Called pt back this morning and told him that he has an apt to go to [facility] on 05-07-12 @ 7:15 and relayed the message from Dr Maisie Fus sent to [place] stating if he has a pelvic floor problem that caused his ulcer, it would cause the repair to fail by putting abnormal stress on it, and pt stated that he understand that's why he is having the test on 11-7

## 2012-05-07 ENCOUNTER — Ambulatory Visit
Admission: RE | Admit: 2012-05-07 | Discharge: 2012-05-07 | Disposition: A | Discharge status: Home / Self Care | Payer: 59 | Source: Ambulatory Visit | Attending: General Surgery | Admitting: General Surgery

## 2012-05-07 ENCOUNTER — Other Ambulatory Visit (INDEPENDENT_AMBULATORY_CARE_PROVIDER_SITE_OTHER): Payer: Self-pay | Admitting: General Surgery

## 2012-05-07 DIAGNOSIS — K626 Ulcer of anus and rectum: Secondary | ICD-10-CM

## 2012-05-07 MED ORDER — GADOBENATE DIMEGLUMINE 529 MG/ML IV SOLN: 20.0000 mL | Freq: Once | INTRAVENOUS | Status: AC | PRN

## 2012-05-11 ENCOUNTER — Other Ambulatory Visit: Payer: Self-pay

## 2012-05-12 ENCOUNTER — Telehealth (INDEPENDENT_AMBULATORY_CARE_PROVIDER_SITE_OTHER): Payer: Self-pay | Admitting: General Surgery

## 2012-05-12 NOTE — Telephone Encounter (Signed)
I called to discuss his MR results.  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  We will go ahead with EUA on Dec 2.  I may not need to do a MAF.  We will evaluate once we get to the OR.

## 2012-05-19 ENCOUNTER — Encounter (HOSPITAL_COMMUNITY): Payer: Self-pay | Admitting: Pharmacy Technician

## 2012-05-20 ENCOUNTER — Other Ambulatory Visit (HOSPITAL_COMMUNITY): Payer: Self-pay | Admitting: General Surgery

## 2012-05-21 ENCOUNTER — Encounter (HOSPITAL_COMMUNITY)
Admission: RE | Admit: 2012-05-21 | Discharge: 2012-05-21 | Disposition: A | Discharge status: Home / Self Care | Payer: 59 | Source: Ambulatory Visit | Attending: General Surgery | Admitting: General Surgery

## 2012-05-21 ENCOUNTER — Encounter (HOSPITAL_COMMUNITY): Payer: Self-pay

## 2012-05-21 LAB — CBC
HCT: 41.3 % (ref 39.0–52.0)
MCH: 32.6 pg (ref 26.0–34.0)
MCHC: 34.1 g/dL (ref 30.0–36.0)
MCV: 95.4 fL (ref 78.0–100.0)
RDW: 15.2 % (ref 11.5–15.5)

## 2012-05-21 LAB — BASIC METABOLIC PANEL
BUN: 18 mg/dL (ref 6–23)
CO2: 22 mEq/L (ref 19–32)
Calcium: 9.2 mg/dL (ref 8.4–10.5)
Chloride: 102 mEq/L (ref 96–112)
Creatinine, Ser: 0.8 mg/dL (ref 0.50–1.35)
Glucose, Bld: 110 mg/dL — ABNORMAL HIGH (ref 70–99)

## 2012-05-21 NOTE — Progress Notes (Signed)
05/21/12 1201  OBSTRUCTIVE SLEEP APNEA  Have you ever been diagnosed with sleep apnea through a sleep study? No  Do you snore loudly (loud enough to be heard through closed doors)?  0  Do you often feel tired, fatigued, or sleepy during the daytime? 0  Do you have, or are you being treated for high blood pressure? 1  BMI more than 35 kg/m2? 1  Age over 63 years old? 1  Neck circumference greater than 40 cm/18 inches? 1  Gender: 1  Obstructive Sleep Apnea Score 5   Score 4 or greater  Results sent to PCP

## 2012-05-21 NOTE — Patient Instructions (Signed)
20      Your procedure is scheduled on:  Monday 06/01/2012 at 0730 am  Report to [facility] a [id number]  AM.  Call this number if you have problems the morning of surgery: [phone]   Remember:   Do not eat food or drink liquids after midnight!  Take these medicines the morning of surgery with A SIP OF WATER: Metoprolol, use Restasis eye drops if needed   Do not bring valuables to the hospital.  .  Leave suitcase in the car. After surgery it may be brought to your room.  For patients admitted to the hospital, checkout time is 11:00 AM the day of              Discharge.    Special Instructions: See [facility] Preparing  For Surgery Instruction Sheet. Do not wear jewelry, lotions powders, perfumes. Women do not shave legs or underarms for 12 hours before showers. Contacts, partial plates,  or dentures may not be worn into surgery.                          Patients discharged the day of surgery will not be allowed to drive home. If going home the same day of surgery, must have someone stay with you first 24 hrs.at home and arrange for someone to drive you home from the              Hospital. YOUR DRIVER UE:[id number]   Please read over the following fact sheets that you were given: MRSA INFORMATION,INCENTIVE SPIROMETRY SHEET, SLEEP APNEA SHEET                            Telford Nab.Dreanna Kyllo,RN,BSN     [phone]

## 2012-05-22 ENCOUNTER — Other Ambulatory Visit (INDEPENDENT_AMBULATORY_CARE_PROVIDER_SITE_OTHER): Payer: Self-pay | Admitting: General Surgery

## 2012-05-22 MED ORDER — FLEET ENEMA 7-19 GM/118ML RE ENEM: 1.0000 | ENEMA | Freq: Once | RECTAL | Status: DC

## 2012-05-22 MED ORDER — METRONIDAZOLE IVPB CUSTOM: 500.0000 mg | INTRAVENOUS | Status: DC

## 2012-06-01 ENCOUNTER — Ambulatory Visit (HOSPITAL_COMMUNITY): Payer: 59 | Admitting: Anesthesiology

## 2012-06-01 ENCOUNTER — Encounter (HOSPITAL_COMMUNITY)
Admission: RE | Disposition: A | Discharge status: Home / Self Care | Payer: Self-pay | Source: Ambulatory Visit | Attending: General Surgery

## 2012-06-01 ENCOUNTER — Ambulatory Visit (HOSPITAL_COMMUNITY)
Admission: RE | Admit: 2012-06-01 | Discharge: 2012-06-01 | Disposition: A | Discharge status: Home / Self Care | Payer: 59 | Source: Ambulatory Visit | Attending: General Surgery | Admitting: General Surgery

## 2012-06-01 ENCOUNTER — Encounter (HOSPITAL_COMMUNITY): Payer: Self-pay | Admitting: *Deleted

## 2012-06-01 ENCOUNTER — Encounter (HOSPITAL_COMMUNITY): Payer: Self-pay | Admitting: Anesthesiology

## 2012-06-01 DIAGNOSIS — K612 Anorectal abscess: Secondary | ICD-10-CM | POA: Insufficient documentation

## 2012-06-01 DIAGNOSIS — K219 Gastro-esophageal reflux disease without esophagitis: Secondary | ICD-10-CM | POA: Insufficient documentation

## 2012-06-01 DIAGNOSIS — K626 Ulcer of anus and rectum: Secondary | ICD-10-CM | POA: Insufficient documentation

## 2012-06-01 DIAGNOSIS — Z79899 Other long term (current) drug therapy: Secondary | ICD-10-CM | POA: Insufficient documentation

## 2012-06-01 DIAGNOSIS — Z96659 Presence of unspecified artificial knee joint: Secondary | ICD-10-CM | POA: Insufficient documentation

## 2012-06-01 DIAGNOSIS — G4730 Sleep apnea, unspecified: Secondary | ICD-10-CM | POA: Insufficient documentation

## 2012-06-01 DIAGNOSIS — I10 Essential (primary) hypertension: Secondary | ICD-10-CM | POA: Insufficient documentation

## 2012-06-01 DIAGNOSIS — Z01812 Encounter for preprocedural laboratory examination: Secondary | ICD-10-CM | POA: Insufficient documentation

## 2012-06-01 HISTORY — PX: ANAL FISTULECTOMY: SHX1139

## 2012-06-01 SURGERY — FISTULECTOMY, ANAL
Anesthesia: General | Site: Rectum | Wound class: Contaminated

## 2012-06-01 MED ORDER — LABETALOL HCL 5 MG/ML IV SOLN
INTRAVENOUS | Status: DC | PRN
  Administered 2012-06-01: 5 mg via INTRAVENOUS

## 2012-06-01 MED ORDER — METRONIDAZOLE IN NACL 5-0.79 MG/ML-% IV SOLN
500.0000 mg | INTRAVENOUS | Status: AC
  Administered 2012-06-01: .5 g via INTRAVENOUS

## 2012-06-01 MED ORDER — METRONIDAZOLE 500 MG PO TABS: 500.0000 mg | ORAL_TABLET | Freq: Three times a day (TID) | ORAL | Status: DC

## 2012-06-01 MED ORDER — CIPROFLOXACIN IN D5W 400 MG/200ML IV SOLN
400.0000 mg | INTRAVENOUS | Status: AC
  Administered 2012-06-01: 400 mg via INTRAVENOUS

## 2012-06-01 MED ORDER — ONDANSETRON HCL 4 MG/2ML IJ SOLN
INTRAMUSCULAR | Status: DC | PRN
  Administered 2012-06-01: 4 mg via INTRAVENOUS

## 2012-06-01 MED ORDER — ACETAMINOPHEN 10 MG/ML IV SOLN
INTRAVENOUS | Status: AC
  Filled 2012-06-01: qty 100

## 2012-06-01 MED ORDER — CIPROFLOXACIN HCL 500 MG PO TABS: 500.0000 mg | ORAL_TABLET | Freq: Two times a day (BID) | ORAL | Status: DC

## 2012-06-01 MED ORDER — LACTATED RINGERS IV SOLN
INTRAVENOUS | Status: DC | PRN
  Administered 2012-06-01 (×2): via INTRAVENOUS

## 2012-06-01 MED ORDER — LIDOCAINE HCL (CARDIAC) 20 MG/ML IV SOLN
INTRAVENOUS | Status: DC | PRN
  Administered 2012-06-01: 50 mg via INTRAVENOUS

## 2012-06-01 MED ORDER — DIAZEPAM 5 MG PO TABS: 5.0000 mg | ORAL_TABLET | Freq: Four times a day (QID) | ORAL | Status: DC | PRN

## 2012-06-01 MED ORDER — MIDAZOLAM HCL 5 MG/5ML IJ SOLN
INTRAMUSCULAR | Status: DC | PRN
  Administered 2012-06-01: 2 mg via INTRAVENOUS

## 2012-06-01 MED ORDER — DIAZEPAM 5 MG PO TABS
5.0000 mg | ORAL_TABLET | Freq: Once | ORAL | Status: AC
  Administered 2012-06-01: 5 mg via ORAL
  Filled 2012-06-01: qty 1

## 2012-06-01 MED ORDER — DOCUSATE SODIUM 100 MG PO CAPS: 100.0000 mg | ORAL_CAPSULE | Freq: Three times a day (TID) | ORAL | Status: DC

## 2012-06-01 MED ORDER — FENTANYL CITRATE 0.05 MG/ML IJ SOLN
INTRAMUSCULAR | Status: DC | PRN
  Administered 2012-06-01 (×2): 100 ug via INTRAVENOUS
  Administered 2012-06-01: 50 ug via INTRAVENOUS

## 2012-06-01 MED ORDER — ROCURONIUM BROMIDE 100 MG/10ML IV SOLN
INTRAVENOUS | Status: DC | PRN
  Administered 2012-06-01: 10 mg via INTRAVENOUS
  Administered 2012-06-01: 50 mg via INTRAVENOUS
  Administered 2012-06-01 (×2): 10 mg via INTRAVENOUS

## 2012-06-01 MED ORDER — NEOSTIGMINE METHYLSULFATE 1 MG/ML IJ SOLN
INTRAMUSCULAR | Status: DC | PRN
  Administered 2012-06-01: 4 mg via INTRAVENOUS

## 2012-06-01 MED ORDER — PROPOFOL 10 MG/ML IV BOLUS
INTRAVENOUS | Status: DC | PRN
  Administered 2012-06-01: 200 mg via INTRAVENOUS

## 2012-06-01 MED ORDER — METRONIDAZOLE IN NACL 5-0.79 MG/ML-% IV SOLN
INTRAVENOUS | Status: AC
  Filled 2012-06-01: qty 100

## 2012-06-01 MED ORDER — ACETAMINOPHEN 10 MG/ML IV SOLN
INTRAVENOUS | Status: DC | PRN
  Administered 2012-06-01: 1000 mg via INTRAVENOUS

## 2012-06-01 MED ORDER — CIPROFLOXACIN IN D5W 400 MG/200ML IV SOLN
INTRAVENOUS | Status: AC
  Filled 2012-06-01: qty 200

## 2012-06-01 MED ORDER — LIDOCAINE HCL 2 % EX GEL
CUTANEOUS | Status: AC
  Filled 2012-06-01: qty 10

## 2012-06-01 MED ORDER — OXYCODONE HCL 5 MG PO TABS
5.0000 mg | ORAL_TABLET | ORAL | Status: DC | PRN
  Administered 2012-06-01 (×2): 5 mg via ORAL
  Filled 2012-06-01 (×2): qty 1

## 2012-06-01 MED ORDER — OXYCODONE HCL 5 MG PO TABS: 5.0000 mg | ORAL_TABLET | Freq: Four times a day (QID) | ORAL | Status: DC | PRN

## 2012-06-01 MED ORDER — HYDROMORPHONE HCL PF 1 MG/ML IJ SOLN
INTRAMUSCULAR | Status: DC | PRN
  Administered 2012-06-01 (×2): 1 mg via INTRAVENOUS

## 2012-06-01 MED ORDER — FENTANYL CITRATE 0.05 MG/ML IJ SOLN
25.0000 ug | INTRAMUSCULAR | Status: DC | PRN
  Administered 2012-06-01: 50 ug via INTRAVENOUS
  Administered 2012-06-01 (×2): 25 ug via INTRAVENOUS

## 2012-06-01 MED ORDER — BUPIVACAINE LIPOSOME 1.3 % IJ SUSP
20.0000 mL | INTRAMUSCULAR | Status: DC
  Filled 2012-06-01: qty 20

## 2012-06-01 MED ORDER — GLYCOPYRROLATE 0.2 MG/ML IJ SOLN
INTRAMUSCULAR | Status: DC | PRN
  Administered 2012-06-01: 0.6 mg via INTRAVENOUS

## 2012-06-01 MED ORDER — PROMETHAZINE HCL 25 MG/ML IJ SOLN: 6.2500 mg | INTRAMUSCULAR | Status: DC | PRN

## 2012-06-01 MED ORDER — BUPIVACAINE-EPINEPHRINE 0.25% -1:200000 IJ SOLN
INTRAMUSCULAR | Status: DC | PRN
  Administered 2012-06-01: 20 mL

## 2012-06-01 MED ORDER — FENTANYL CITRATE 0.05 MG/ML IJ SOLN
INTRAMUSCULAR | Status: AC
  Filled 2012-06-01: qty 2

## 2012-06-01 MED ORDER — KETOROLAC TROMETHAMINE 30 MG/ML IJ SOLN: 15.0000 mg | Freq: Once | INTRAMUSCULAR | Status: DC | PRN

## 2012-06-01 SURGICAL SUPPLY — 47 items
BLADE HEX COATED 2.75 (ELECTRODE) ×2 IMPLANT
BLADE SURG 15 STRL LF DISP TIS (BLADE) ×1 IMPLANT
BLADE SURG 15 STRL SS (BLADE) ×2
CANISTER SUCTION 2500CC (MISCELLANEOUS) ×2 IMPLANT
CATH MUSHROOM 22FR (CATHETERS) ×1 IMPLANT
CATH T TUBE WHEL MOSS 12FR (CATHETERS) ×1 IMPLANT
CLOTH BEACON ORANGE TIMEOUT ST (SAFETY) ×2 IMPLANT
COVER MAYO STAND STRL (DRAPES) ×2 IMPLANT
DECANTER SPIKE VIAL GLASS SM (MISCELLANEOUS) ×2 IMPLANT
DRAPE LG THREE QUARTER DISP (DRAPES) ×2 IMPLANT
DRSG PAD ABDOMINAL 8X10 ST (GAUZE/BANDAGES/DRESSINGS) ×1 IMPLANT
ELECT REM PT RETURN 9FT ADLT (ELECTROSURGICAL) ×2
ELECTRODE REM PT RTRN 9FT ADLT (ELECTROSURGICAL) ×1 IMPLANT
GAUZE SPONGE 4X4 16PLY XRAY LF (GAUZE/BANDAGES/DRESSINGS) ×2 IMPLANT
GAUZE VASELINE 3X9 (GAUZE/BANDAGES/DRESSINGS) IMPLANT
GLOVE BIO SURGEON STRL SZ7 (GLOVE) ×2 IMPLANT
GLOVE BIO SURGEON STRL SZ7.5 (GLOVE) ×2 IMPLANT
GLOVE BIOGEL M STRL SZ7.5 (GLOVE) ×1 IMPLANT
GLOVE BIOGEL PI IND STRL 7.0 (GLOVE) ×1 IMPLANT
GLOVE BIOGEL PI INDICATOR 7.0 (GLOVE) ×2
GLOVE INDICATOR 8.0 STRL GRN (GLOVE) ×2 IMPLANT
GOWN PREVENTION PLUS LG XLONG (DISPOSABLE) ×2 IMPLANT
GOWN PREVENTION PLUS XLARGE (GOWN DISPOSABLE) ×2 IMPLANT
GOWN PREVENTION PLUS XXLARGE (GOWN DISPOSABLE) ×1 IMPLANT
GOWN STRL NON-REIN LRG LVL3 (GOWN DISPOSABLE) ×2 IMPLANT
GOWN STRL REIN XL XLG (GOWN DISPOSABLE) ×2 IMPLANT
KIT BASIN OR (CUSTOM PROCEDURE TRAY) ×2 IMPLANT
LUBRICANT JELLY K Y 4OZ (MISCELLANEOUS) ×2 IMPLANT
NDL HYPO 25X1 1.5 SAFETY (NEEDLE) ×1 IMPLANT
NDL SAFETY ECLIPSE 18X1.5 (NEEDLE) IMPLANT
NEEDLE HYPO 18GX1.5 SHARP (NEEDLE)
NEEDLE HYPO 25X1 1.5 SAFETY (NEEDLE) ×2 IMPLANT
NS IRRIG 1000ML POUR BTL (IV SOLUTION) ×2 IMPLANT
PACK BASIC VI WITH GOWN DISP (CUSTOM PROCEDURE TRAY) ×2 IMPLANT
PENCIL BUTTON HOLSTER BLD 10FT (ELECTRODE) ×2 IMPLANT
SPONGE GAUZE 4X4 12PLY (GAUZE/BANDAGES/DRESSINGS) IMPLANT
SPONGE SURGIFOAM ABS GEL 12-7 (HEMOSTASIS) ×3 IMPLANT
SUCTION FRAZIER 12FR DISP (SUCTIONS) ×1 IMPLANT
SUT CHROMIC 2 0 SH (SUTURE) ×11 IMPLANT
SUT CHROMIC 3 0 SH 27 (SUTURE) ×1 IMPLANT
SUT MON AB 3-0 SH 27 (SUTURE)
SUT MON AB 3-0 SH27 (SUTURE) IMPLANT
SUT VIC AB 4-0 P-3 18XBRD (SUTURE) IMPLANT
SUT VIC AB 4-0 P3 18 (SUTURE)
SYR CONTROL 10ML LL (SYRINGE) ×2 IMPLANT
TOWEL OR 17X26 10 PK STRL BLUE (TOWEL DISPOSABLE) ×2 IMPLANT
YANKAUER SUCT BULB TIP 10FT TU (MISCELLANEOUS) ×2 IMPLANT

## 2012-06-01 NOTE — Anesthesia Preprocedure Evaluation (Signed)
Anesthesia Evaluation  Patient identified by MRN, date of birth, ID band Patient awake    Reviewed: Allergy & Precautions, H&P , NPO status , Patient's Chart, lab work & pertinent test results  Airway Mallampati: II TM Distance: <3 FB Neck ROM: Full    Dental No notable dental hx.    Pulmonary sleep apnea ,  breath sounds clear to auscultation  Pulmonary exam normal       Cardiovascular hypertension, Rhythm:Regular Rate:Normal     Neuro/Psych negative neurological ROS  negative psych ROS   GI/Hepatic negative GI ROS, Neg liver ROS,   Endo/Other  negative endocrine ROSMorbid obesity  Renal/GU Renal disease  negative genitourinary   Musculoskeletal negative musculoskeletal ROS (+)   Abdominal   Peds negative pediatric ROS (+)  Hematology negative hematology ROS (+)   Anesthesia Other Findings   Reproductive/Obstetrics negative OB ROS                           Anesthesia Physical Anesthesia Plan  ASA: III  Anesthesia Plan: General   Post-op Pain Management:    Induction: Intravenous  Airway Management Planned: Oral ETT  Additional Equipment:   Intra-op Plan:   Post-operative Plan: Extubation in OR  Informed Consent: I have reviewed the patients History and Physical, chart, labs and discussed the procedure including the risks, benefits and alternatives for the proposed anesthesia with the patient or authorized representative who has indicated his/her understanding and acceptance.   Dental advisory given  Plan Discussed with: CRNA and Surgeon  Anesthesia Plan Comments:         Anesthesia Quick Evaluation

## 2012-06-01 NOTE — Anesthesia Postprocedure Evaluation (Signed)
  Anesthesia Post-op Note  Patient: Timothy Mckinney  Procedure(s) Performed: Procedure(s) (LRB): FISTULECTOMY ANAL (N/A)  Patient Location: PACU  Anesthesia Type: General  Level of Consciousness: awake and alert   Airway and Oxygen Therapy: Patient Spontanous Breathing  Post-op Pain: mild  Post-op Assessment: Post-op Vital signs reviewed, Patient's Cardiovascular Status Stable, Respiratory Function Stable, Patent Airway and No signs of Nausea or vomiting  Last Vitals:  Filed Vitals:   06/01/12 1022  BP: 163/88  Pulse:   Temp: 36.6 C  Resp:     Post-op Vital Signs: stable   Complications: No apparent anesthesia complications

## 2012-06-01 NOTE — Op Note (Signed)
06/01/2012  10:13 AM  PATIENT:  Len Childs  61 y.o. male  Patient Care Team: Pecola Lawless, MD as PCP - General  PRE-OPERATIVE DIAGNOSIS:  anal ulcer  POST-OPERATIVE DIAGNOSIS:  anal abscess cavity  PROCEDURE:  Procedure(s): Mucosal advancement flap   SURGEON:  Surgeon(s): Romie Levee, MD  ASSISTANT: Avel Peace, MD   ANESTHESIA:   local and general  EBL:  Total I/O In: 1000 [I.V.:1000] Out: 200 [Blood:200]  DRAINS: T tube transanally  SPECIMEN:  Source of Specimen:  anal polyp  DISPOSITION OF SPECIMEN:  PATHOLOGY  COUNTS:  YES  PLAN OF CARE: Discharge to home after PACU  PATIENT DISPOSITION:  PACU - hemodynamically stable.  INDICATION: this is a 61 year old male who was referred to my office with a anal canal defect. It appeared that he had an anal ulceration posteriorly.  We decided to perform an exam under anesthesia with possible mucosal advancement flap versus I&D and seton placement. The risks and benefits of this procedure were 20 to the patient prior to the OR consent was signed and placed on chart.  OR FINDINGS: posterior abscess cavity with posterior anal canal defect  DESCRIPTION:  The patient was identified bubbling and taken to the OR where general anesthesia was smoothly induced. The patient was then laid prone on the operating room table. He was placed in jackknife position and his buttocks were gently taped apart.  He was prepped and draped in usual sterile fashion. A surgical timeout was performed indicating the correct patient, procedure, positioning and preoperative antibiotics. SCDs were also noted to be in place prior to the initiation of anesthesia. Once this was completed we began by doing exam under anesthesia. A large posterior anal defect was noted. There were 2 large anal polyps. These were resected and sent to pathology for further examination. After examining everything it appeared the best course of action would be to do a  mucosal advancement flap and then drained his cavity through the skin. I began by making a because of flap of tissue posteriorly at the dentate line. This was done using Bovie electrocautery. The flap was taken down in the submucosal plane. I dissected out laterally approximately 1/3-1/2 of the anal canal. I was able to mobilize a flap of tissue with some difficulty due to chronic inflammation. I took this down as proximally as I could. The flap was able to be mobilized to above the defect without tension. We did have a portion of devitalized flap which was removed using Bovie electrocautery. I then began to suture the flap to the anal mucosa using interrupted 2-0 chromic sutures. Prior to completing this, I did place a T-tube within the abscess cavity and brought this out through the left posterior wall, distal to the external anal sphincter. This was then sutured into place with a 2-0 nylon suture. I then closed the remaining portion of the rectal flap. I placed a rigid sigmoidoscope into the anal canal and found that it was widely patent. I then placed Gelfoam into the anal canal. A rectal block was then performed using 0.5% Marcaine with epi. A sterile dressing was then applied. The patient was then awakened from anesthesia and sent to the postanesthesia care unit in stable condition. HER crit operating room staff.

## 2012-06-01 NOTE — Progress Notes (Signed)
Bladder scan done-370ml, encouraged pt to continue to drink fluids

## 2012-06-01 NOTE — Transfer of Care (Signed)
Immediate Anesthesia Transfer of Care Note  Patient: Timothy Mckinney  Procedure(s) Performed: Procedure(s) (LRB) with comments: FISTULECTOMY ANAL (N/A) - Mucosal Advancement Flap   Patient Location: PACU  Anesthesia Type:General  Level of Consciousness: awake, alert  and oriented  Airway & Oxygen Therapy: Patient Spontanous Breathing and Patient connected to face mask oxygen  Post-op Assessment: Report given to PACU RN and Post -op Vital signs reviewed and stable  Post vital signs: Reviewed and stable  Complications: No apparent anesthesia complications

## 2012-06-01 NOTE — H&P (Signed)
HISTORY: Timothy Mckinney is a 61 y.o. male who presents to the office with rectal bleeding. Other symptoms include 3-4 BM's a day. He was on abx for an infected knee and this made his symptoms better. Wiping makes the symptoms worse. This had been occurring for at least 3 years. His bowel habits are regular and his bowel movements are mixed. His fiber intake is good. His last colonoscopy was yesterday where some polyps were found in his anal canal and rectum. He has noticed some prolapsing tissue that spontaneously reduces. He has some minor leakage occasionally.    Past Medical History   Diagnosis  Date   .  Dry eyes, bilateral      Dr Hazle Quant   .  Nephrolithiasis      Dr Aldean Ast   .  Hyperuricemia    .  Abnormal chest x-ray      ? granulomata   .  Hypertension      NEW DIAGNOSIS AND STARTED ON METOPROLOL ON 10/04/11   .  Elevated liver enzymes      HISTORY OF ELEVATED LIVER ENZYMES   .  Thrombocytopenia      HX OF LOW PLATELET COUNT   .  Sleep apnea      STOP BANG SCORE OF 4   .  GERD (gastroesophageal reflux disease)      hx of   .  Arthritis      knees and back    Past Surgical History   Procedure  Date   .  Cardiac catheterization      in 40s, negative   .  Tear duct plugs    .  Knee arthroscopy  2009     L   .  Colonoscopy w/ polypectomy  2011     Dr Jarold Motto   .  Colon ulcer  2011   .  Total knee arthroplasty  2011     Workman's Comp case, Dr Ranell Patrick   .  Lithotripsy    .  Knee surgery      april 2013 total knee replacement removed    Current Outpatient Prescriptions   Medication  Sig  Dispense  Refill   .  allopurinol (ZYLOPRIM) 300 MG tablet  Take 300 mg by mouth daily.     .  cycloSPORINE (RESTASIS) 0.05 % ophthalmic emulsion  Place 1 drop into both eyes daily.     .  diphenhydrAMINE (BENADRYL) 25 MG tablet  Take 25 mg by mouth. ONE AT BEDTIME IF NEEDED SLEEP     .  metoprolol tartrate (LOPRESSOR) 25 MG tablet  Take 25 mg by mouth daily.      Allergies   Allergen   Reactions   .  Penicillins  Swelling     Diffuse swelling    Family History   Problem  Relation  Age of Onset   .  Heart disease  Father    .  Cancer  Paternal Grandfather       throat , pipe smoker    .  Colon polyps  Mother    .  Dementia  Mother       died post SDH    History    Social History   .  Marital Status:  Married     Spouse Name:  N/A     Number of Children:  N/A   .  Years of Education:  N/A    Social History Main Topics   .  Smoking status:  Never Smoker   .  Smokeless tobacco:  Never Used   .  Alcohol Use:  Yes      2-3 drinks / day on average   .  Drug Use:  No   .  Sexually Active:  None    Other Topics  Concern   .  None    Social History Narrative   .  None    REVIEW OF SYSTEMS - PERTINENT POSITIVES ONLY:  Review of Systems - General ROS: negative for - chills, fever or malaise  Hematological and Lymphatic ROS: negative for - bleeding problems, blood clots or bruising  Respiratory ROS: no cough, shortness of breath, or wheezing  Cardiovascular ROS: no chest pain or dyspnea on exertion  Gastrointestinal ROS: no abdominal pain, change in bowel habits, or black or bloody stools  Genito-Urinary ROS: no dysuria, trouble voiding, or hematuria   EXAM:  Filed Vitals:   06/01/12 0530  BP: 179/94  Pulse: 89  Temp: 98.6 F (37 C)  Resp: 18   General appearance: alert, cooperative and no distress  Resp: clear to auscultation bilaterally  Cardio: regular rate and rhythm  GI: soft, non-tender; bowel sounds normal; no masses, no organomegaly  Extremities: no edema, midline scar over L knee  Anoscopy Findings: posterior outpouching of rectal wall with visualization of anal sphincters, moderate sphincter tone   ASSESSMENT AND PLAN:  Timothy Mckinney is a 61 y.o. male who was referred to me for anal polypoid masses and symptoms of bleeding when he wipes. He also complains of occasional anal leakage. On exam it feels as if he has a outpouching of his  rectal wall posteriorly. Directly distal to this is his anal sphincter complex which appears to be denuded. This could be due to a chronic anal fissure, but he states no history of pain with bowel movements or constipation. I suppose it could also be a chronic cavity where his ulcer used to be. The polypoid masses appear to be inflammatory masses, but we will await the pathology from the biopsies yesterday. An MRI showed a horseshoe type abscess.  We have discussed that I will perform an EUA and possible repair or debride what I can.  He understands that there are multiple surgical options and that I will choose the best one while he is on the table.  He has agreed to this.  Possible risks are bleeding, infection, need for additional surgery, urinary retention and worsening disease.  I believe all of these risks are very low.   Vanita Panda, MD   Colon and Rectal Surgery / General Surgery  [facility]

## 2012-06-02 ENCOUNTER — Encounter (HOSPITAL_COMMUNITY): Payer: Self-pay | Admitting: General Surgery

## 2012-06-02 ENCOUNTER — Telehealth (INDEPENDENT_AMBULATORY_CARE_PROVIDER_SITE_OTHER): Payer: Self-pay

## 2012-06-02 NOTE — Telephone Encounter (Signed)
Patient calling into office with questions and concerns from his recent Fistulotomy on 06/01/12.  Patient reports having low back pain that radiates down in his legs.  Patient reports voiding every 45 minutes, patient urine has a cloudy appearance.  Patient also has questions related to his follow up appointment and expected length of experiencing drainage.

## 2012-06-02 NOTE — Telephone Encounter (Signed)
Spoke to patient regarding his low back pain, patient aware that this could be from the surgical position and the strain it could have put on his back.  Patient advised that he can use a heating pad if needed.  Patient made aware that he could experience drainage for a long while after surgery.  Patient continues to have urinary problems he may need to have a catheter placed.  Will review with Dr. Maisie Fus and contact patient to discuss further.

## 2012-06-02 NOTE — Telephone Encounter (Signed)
I discussed this with Dr Abbey Chatters and he said the back pain may be due to the position of surgery putting a strain on his back.  He can use a heating pad.  The drainage will go on for a long while.  Regarding his urinating, if this is still an issue tomorrow, call us back and he may need a catheter put in.  It can be discussed with Dr Maisie Fus tomorrow though.

## 2012-06-03 ENCOUNTER — Telehealth (INDEPENDENT_AMBULATORY_CARE_PROVIDER_SITE_OTHER): Payer: Self-pay

## 2012-06-03 ENCOUNTER — Telehealth (INDEPENDENT_AMBULATORY_CARE_PROVIDER_SITE_OTHER): Payer: Self-pay | Admitting: General Surgery

## 2012-06-03 NOTE — Telephone Encounter (Signed)
He can take the valium and warm baths for his urinary retention.  If this does not work, he would need a catheter.

## 2012-06-03 NOTE — Telephone Encounter (Signed)
Called patient this morning to check on him and to see if he had urinary and he stated that he had a BM also. Patient stated that he is feeling well

## 2012-06-03 NOTE — Telephone Encounter (Signed)
Called patient to give recommendations for relief of urinary retention (advised to take his valium and warm baths)  Patient reports improvement today with his urinary symptoms.  Patient advised if symptoms return to call our office for further instructions.

## 2012-06-05 ENCOUNTER — Telehealth (INDEPENDENT_AMBULATORY_CARE_PROVIDER_SITE_OTHER): Payer: Self-pay

## 2012-06-05 NOTE — Telephone Encounter (Signed)
Patient called in and asked if he should be using bath salts with his sitz baths, wanted to know if it was normal for drainage to be darker in color than it was and to say that he is still having back pain. I asked if he tried using heating pads like he was advised to do last time he called and asked if he still had pain medicine. He said he has not used heating pads which I told him to start doing and he had enough pain medicine until Monday. He will call next week for refill if he needs some. I told him it was normal for drainage to get darker and that bath salts were not necessary but that he needs to definitely do the sitz baths. Patient agreed and will call back with any questions or concerns.

## 2012-06-08 ENCOUNTER — Telehealth (INDEPENDENT_AMBULATORY_CARE_PROVIDER_SITE_OTHER): Payer: Self-pay

## 2012-06-08 ENCOUNTER — Other Ambulatory Visit (INDEPENDENT_AMBULATORY_CARE_PROVIDER_SITE_OTHER): Payer: Self-pay

## 2012-06-08 DIAGNOSIS — G8918 Other acute postprocedural pain: Secondary | ICD-10-CM

## 2012-06-08 MED ORDER — OXYCODONE HCL 5 MG PO TABS: 5.0000 mg | ORAL_TABLET | Freq: Four times a day (QID) | ORAL | Status: DC | PRN

## 2012-06-08 NOTE — Telephone Encounter (Signed)
Patient called in requesting oxy 5 mg refill. Paged dr Maisie Fus; OR nurse called back and took message to relay. Dr Maisie Fus said she would call back regarding refill. Patient needs to be called back once Dr Maisie Fus call back.

## 2012-06-08 NOTE — Telephone Encounter (Signed)
Dr Maisie Fus called back and okayed the refill. Called patient to let him know prescription was at front desk for pick up

## 2012-06-12 ENCOUNTER — Encounter (INDEPENDENT_AMBULATORY_CARE_PROVIDER_SITE_OTHER): Payer: Self-pay | Admitting: General Surgery

## 2012-06-12 ENCOUNTER — Telehealth (INDEPENDENT_AMBULATORY_CARE_PROVIDER_SITE_OTHER): Payer: Self-pay | Admitting: General Surgery

## 2012-06-12 ENCOUNTER — Ambulatory Visit (INDEPENDENT_AMBULATORY_CARE_PROVIDER_SITE_OTHER): Payer: Commercial Managed Care - PPO | Admitting: General Surgery

## 2012-06-12 VITALS — BP 138/78 | HR 76 | Temp 98.1°F | Resp 20 | Ht 72.0 in | Wt 302.4 lb

## 2012-06-12 DIAGNOSIS — G8918 Other acute postprocedural pain: Secondary | ICD-10-CM

## 2012-06-12 MED ORDER — OXYCODONE HCL 5 MG PO TABS: 5.0000 mg | ORAL_TABLET | ORAL | Status: DC | PRN

## 2012-06-12 NOTE — Progress Notes (Signed)
I saw Timothy Mckinney back in clinic after a mucosal advancement flap with perirectal drain. He states that he has had increasing rectal pain over the last 2-3 days. This was followed by increased rectal drainage and then the pain lessened.    Exam Filed Vitals:   06/12/12 1703  BP: 138/78  Pulse: 76  Temp: 98.1 F (36.7 C)  Resp: 20    His anal canal appeared to be intact There was purulent drainage from the drain. The drain suture was in place.  Assessment and plan Normal postoperative course. I have given him a refill on his pain medications so that he can take extra oxycodone to sleep at night. He mainly complains of pain at his drain site and not within his anal canal. He is having good bowel movements. I will see him back in a couple weeks.

## 2012-06-12 NOTE — Telephone Encounter (Signed)
Pt called to report his tube/drain in rectal area has become very painful with slightest movement, especially at night. His wife has tried to pad the area but sill painful and tender. The oxycodone works for 4-6 hours but not between doses. He is calling for other suggestions to help with this issue. Other pain med? Etc/ Please call. Ph- [phone] His follow-up is 06-22-12.

## 2012-06-12 NOTE — Telephone Encounter (Signed)
I called pt and scheduled him to be seen in urgent office today with Dr. Maisie Fus pt is aware/gy

## 2012-06-12 NOTE — Patient Instructions (Signed)
Keep your scheduled apt

## 2012-06-22 ENCOUNTER — Ambulatory Visit (INDEPENDENT_AMBULATORY_CARE_PROVIDER_SITE_OTHER): Payer: Commercial Managed Care - PPO | Admitting: General Surgery

## 2012-06-22 ENCOUNTER — Encounter (INDEPENDENT_AMBULATORY_CARE_PROVIDER_SITE_OTHER): Payer: Self-pay | Admitting: General Surgery

## 2012-06-22 VITALS — BP 120/82 | HR 84 | Temp 99.3°F | Resp 18 | Wt 300.5 lb

## 2012-06-22 DIAGNOSIS — K603 Anal fistula: Secondary | ICD-10-CM

## 2012-06-22 NOTE — Progress Notes (Signed)
Timothy Mckinney is a 61 y.o. male who is status post a mucosa advancement flap on 12/2 and tube drainage of a posterior rectal abscess.  He is doing well.  His drainage has decreased quite a bit.  His pain is minimal.  He is having regular bowel movements.  Objective: Filed Vitals:   06/22/12 1652  BP: 120/82  Pulse: 84  Temp: 99.3 F (37.4 C)  Resp: 18    General appearance: alert and cooperative  Incision: healing well, ~90% healing of the flap   Assessment: s/p  Patient Active Problem List  Diagnosis  . B12 DEFICIENCY  . GERD  . RECTAL BLEEDING  . TRANSAMINASES, SERUM, ELEVATED  . NEPHROLITHIASIS, HX OF  . HYPERURICEMIA, HX OF  . CHEST XRAY, ABNORMAL  . Uncontrolled hypertension as indication for native nephrectomy  . S/P left TK resection, implant abx spacer  . S/P left knee revision / reimplantation    Plan: We will remove the drainage tube to allow for continued healing.   Sitz baths TID Cont high fiber diet F/U in 3-4 weeks   .Vanita Panda, MD [facility], [place] [phone]   06/22/2012 5:41 PM

## 2012-06-22 NOTE — Patient Instructions (Signed)
Continue a high fiber diet, sitz baths three times a day

## 2012-07-07 ENCOUNTER — Telehealth (INDEPENDENT_AMBULATORY_CARE_PROVIDER_SITE_OTHER): Payer: Self-pay | Admitting: General Surgery

## 2012-07-07 NOTE — Telephone Encounter (Signed)
Patient calling status post fistula operation. He states he had draining from his bottom since the drain was removed in the office until last week. He then had a "bump" on his bottom that was sore and sensitive. He states yesterday he had a bowel movement and had bright red bleeding. He showered, did a sitz bath and covered with dry gauze. He states he had a small amount of blood on the gauze. He states he thinks the "bump" is gone. I advised that area probably opened up and drained and that explains the bleeding. Since it is slowing down and not soaking a gauze it sounds okay. I advised him to keep doing sitz baths and keep area dry and covered. Patient to call back with any other symptoms and keep follow up appt on Monday.

## 2012-07-13 ENCOUNTER — Ambulatory Visit (INDEPENDENT_AMBULATORY_CARE_PROVIDER_SITE_OTHER): Payer: Commercial Managed Care - PPO | Admitting: General Surgery

## 2012-07-13 ENCOUNTER — Encounter (INDEPENDENT_AMBULATORY_CARE_PROVIDER_SITE_OTHER): Payer: Self-pay | Admitting: General Surgery

## 2012-07-13 VITALS — BP 152/82 | HR 88 | Temp 97.6°F | Resp 18 | Ht 72.0 in | Wt 301.4 lb

## 2012-07-13 DIAGNOSIS — K625 Hemorrhage of anus and rectum: Secondary | ICD-10-CM

## 2012-07-13 NOTE — Patient Instructions (Signed)
Continue high fiber diet  Ok to stop Sitz baths  Return to office in 3 months.

## 2012-07-13 NOTE — Progress Notes (Signed)
RIO KIDANE is a 62 y.o. male who is status post a mucosa advancement flap on 12/2 and tube drainage of a posterior rectal abscess. He is doing well. His drainage has decreased to once every few days. His pain is minimal. He is having regular bowel movements.   Objective:  Filed Vitals:   07/13/12 1451  BP: 152/82  Pulse: 88  Temp: 97.6 F (36.4 C)  Resp: 18    General appearance: alert and cooperative  Incision:still with a distal posterior defect   Assessment:  s/p  Patient Active Problem List   Diagnosis   .  B12 DEFICIENCY   .  GERD   .  RECTAL BLEEDING   .  TRANSAMINASES, SERUM, ELEVATED   .  NEPHROLITHIASIS, HX OF   .  HYPERURICEMIA, HX OF   .  CHEST XRAY, ABNORMAL   .  Uncontrolled hypertension as indication for native nephrectomy   .  S/P left TK resection, implant abx spacer   .  S/P left knee revision / reimplantation    Plan:  Cont high fiber diet  F/U in 3 months Will stop the sitz baths    Vanita Panda, MD  [facility], [place]  [phone]

## 2012-08-15 ENCOUNTER — Other Ambulatory Visit: Payer: Self-pay

## 2012-09-22 ENCOUNTER — Other Ambulatory Visit: Payer: Self-pay | Admitting: Internal Medicine

## 2012-09-22 NOTE — Telephone Encounter (Signed)
CPX due April

## 2012-10-07 ENCOUNTER — Encounter (INDEPENDENT_AMBULATORY_CARE_PROVIDER_SITE_OTHER): Payer: Self-pay

## 2012-10-13 ENCOUNTER — Encounter (INDEPENDENT_AMBULATORY_CARE_PROVIDER_SITE_OTHER): Payer: Self-pay | Admitting: General Surgery

## 2012-10-13 ENCOUNTER — Ambulatory Visit (INDEPENDENT_AMBULATORY_CARE_PROVIDER_SITE_OTHER): Payer: Commercial Managed Care - PPO | Admitting: General Surgery

## 2012-10-13 VITALS — BP 152/90 | HR 80 | Temp 97.4°F | Resp 16 | Ht 72.0 in | Wt 302.0 lb

## 2012-10-13 DIAGNOSIS — R152 Fecal urgency: Secondary | ICD-10-CM

## 2012-10-13 NOTE — Progress Notes (Addendum)
Timothy Mckinney is a 62 y.o. male who is status post a mucosa advancement flap on 12/2 and tube drainage of a posterior rectal abscess. He is doing well.  He is having regular bowel movements, but complains of some AM urgency and occasional incontinence.  He is tritrating his fiber intake to help with this.  He denies any bleeding.     Objective:  Filed Vitals:   10/13/12 0948  BP: 152/90  Pulse: 80  Temp: 97.4 F (36.3 C)  Resp: 16    General appearance: alert and cooperative  DRE: posterior anal defect present just above the sphincters, appears to have epithelialized, no blood noted, no tenderness, no fluctuant masses or signs of fistula.  Good rectal tone Assessment:  s/p  Patient Active Problem List   Diagnosis   .  B12 DEFICIENCY   .  GERD   .  RECTAL BLEEDING   .  TRANSAMINASES, SERUM, ELEVATED   .  NEPHROLITHIASIS, HX OF   .  HYPERURICEMIA, HX OF   .  CHEST XRAY, ABNORMAL   .  Uncontrolled hypertension as indication for native nephrectomy   .  S/P left TK resection, implant abx spacer   .  S/P left knee revision / reimplantation   Plan:  Cont high fiber diet.  I think the area has healed but he still has a posterior anal canal defect.  I offered to try an anal advancement flap, but he didn't want to attempt this right now.  I have asked that he continue close follow up with Dr Loreta Ave.  He will F/U with me PRN   Vanita Panda, MD  [facility], [place]  [phone]

## 2012-10-13 NOTE — Patient Instructions (Signed)
Return to office if your symptoms worsen.  Continue high fiber diet.

## 2012-11-16 ENCOUNTER — Ambulatory Visit (INDEPENDENT_AMBULATORY_CARE_PROVIDER_SITE_OTHER): Payer: 59 | Admitting: Family Medicine

## 2012-11-16 ENCOUNTER — Encounter: Payer: Self-pay | Admitting: Family Medicine

## 2012-11-16 ENCOUNTER — Telehealth: Payer: Self-pay | Admitting: Internal Medicine

## 2012-11-16 VITALS — BP 130/78 | HR 76 | Temp 98.5°F | Wt 303.0 lb

## 2012-11-16 DIAGNOSIS — J209 Acute bronchitis, unspecified: Secondary | ICD-10-CM

## 2012-11-16 DIAGNOSIS — R062 Wheezing: Secondary | ICD-10-CM

## 2012-11-16 MED ORDER — AZITHROMYCIN 250 MG PO TABS: ORAL_TABLET | ORAL | Status: DC

## 2012-11-16 MED ORDER — IPRATROPIUM BROMIDE 0.02 % IN SOLN
0.5000 mg | Freq: Once | RESPIRATORY_TRACT | Status: AC
  Administered 2012-11-16: 0.5 mg via RESPIRATORY_TRACT

## 2012-11-16 MED ORDER — PROMETHAZINE-DM 6.25-15 MG/5ML PO SYRP: 5.0000 mL | ORAL_SOLUTION | Freq: Four times a day (QID) | ORAL | Status: DC | PRN

## 2012-11-16 MED ORDER — ALBUTEROL SULFATE HFA 108 (90 BASE) MCG/ACT IN AERS: 2.0000 | INHALATION_SPRAY | Freq: Four times a day (QID) | RESPIRATORY_TRACT | Status: DC | PRN

## 2012-11-16 MED ORDER — ALBUTEROL SULFATE (5 MG/ML) 0.5% IN NEBU
2.5000 mg | INHALATION_SOLUTION | Freq: Once | RESPIRATORY_TRACT | Status: AC
  Administered 2012-11-16: 2.5 mg via RESPIRATORY_TRACT

## 2012-11-16 MED ORDER — BENZONATATE 200 MG PO CAPS: 200.0000 mg | ORAL_CAPSULE | Freq: Three times a day (TID) | ORAL | Status: DC | PRN

## 2012-11-16 NOTE — Progress Notes (Signed)
  Subjective:    Patient ID: LETCHER Mckinney, male    DOB: March 29, 1951, 62 y.o.   MRN: [id number]  HPI URI- sxs started 5-6 days ago.  Wife was sick prior.  Saturday woke up dry heaving due to copious mucous.  Strained R abdominal wall during this coughing/vomiting episode.  No current facial pain/pressure.  Cough is productive.  No fevers.  No ear pain.  No current N/V/D.  + constipation.  + wheezing.   Review of Systems For ROS see HPI     Objective:   Physical Exam  Vitals reviewed. Constitutional: He appears well-developed and well-nourished. No distress.  HENT:  Head: Normocephalic and atraumatic.  Right Ear: Tympanic membrane normal.  Left Ear: Tympanic membrane normal.  Nose: No mucosal edema or rhinorrhea. Right sinus exhibits no maxillary sinus tenderness and no frontal sinus tenderness. Left sinus exhibits no maxillary sinus tenderness and no frontal sinus tenderness.  Mouth/Throat: Mucous membranes are normal. No oropharyngeal exudate, posterior oropharyngeal edema or posterior oropharyngeal erythema.  Eyes: Conjunctivae and EOM are normal. Pupils are equal, round, and reactive to light.  Neck: Normal range of motion. Neck supple.  Cardiovascular: Normal rate, regular rhythm and normal heart sounds.   Pulmonary/Chest: Effort normal. No respiratory distress. He has wheezes (diffuse expiratory wheezes w/ prolonged expiratory phase- wheezing improved s/p neb tx).  + hacking cough Coarse BS at LLL  Abdominal: Soft. There is tenderness (TTP over R lower ribs in mid axillary line).  Lymphadenopathy:    He has no cervical adenopathy.  Skin: Skin is warm and dry.          Assessment & Plan:

## 2012-11-16 NOTE — Telephone Encounter (Signed)
Noted, patient with pending appointment with Dr.Tabori, per MD protocol if patient with pending appointment ok to close

## 2012-11-16 NOTE — Telephone Encounter (Signed)
Patient Information:  Caller Name: Berton  Phone: [phone]  Patient: Timothy Mckinney, Timothy Mckinney  Gender: Male  DOB: Aug 04, 1950  Age: 62 Years  PCP: Marga Melnick  Office Follow Up:  Does the office need to follow up with this patient?: No  Instructions For The Office: N/A  RN Note:  Intermittent sharp right upper abdominal pain when moves, coughs or blows nose.  Productive cough is brown or green color.  Hydrate and humidify. No appointments remain with Dr. Alwyn Ren.   Symptoms  Reason For Call & Symptoms: Sudden, intermittent, sharp-stabbing, right sided, upper abdominal pain when coughs or moves.  Pain occrred while having dry heaves from cough.  Reviewed Health History In EMR: Yes  Reviewed Medications In EMR: Yes  Reviewed Allergies In EMR: Yes  Reviewed Surgeries / Procedures: Yes  Date of Onset of Symptoms: 11/14/2012  Treatments Tried: hydrocodone  Treatments Tried Worked: No  Guideline(s) Used:  Abdominal Pain - Upper  Disposition Per Guideline:   See Today in Office  Reason For Disposition Reached:   Age > 60 years  Advice Given:  Call Back If:  Abdominal pain is constant and present for more than 2 hours.  You become worse.  Patient Will Follow Care Advice:  YES  Appointment Scheduled:  11/16/2012 14:15:00 Appointment Scheduled Provider:  Sheliah Hatch.

## 2012-11-16 NOTE — Patient Instructions (Addendum)
Start the Zpack for bronchitis Use the albuterol inhaler- 2 puffs every 4 hrs as needed- for chest tightness/wheezing Cough syrup for nights (will cause drowsiness) Cough pills for daytime Drink plenty of fluids REST! Hang in there!

## 2012-11-17 NOTE — Assessment & Plan Note (Signed)
New.  Pt's sxs improved s/p neb tx.  Start Zpack, albuterol HFA, cough meds prn.  If no improvement, will need CXR.  Continue celebrex and tylenol for abd/intercostal strain.  Reviewed supportive care and red flags that should prompt return.  Pt expressed understanding and is in agreement w/ plan.

## 2013-01-18 ENCOUNTER — Other Ambulatory Visit: Payer: Self-pay | Admitting: Internal Medicine

## 2013-04-13 ENCOUNTER — Other Ambulatory Visit: Payer: Self-pay | Admitting: Internal Medicine

## 2013-04-13 NOTE — Telephone Encounter (Signed)
Metoprolol refill sent to pharmacy

## 2013-05-06 ENCOUNTER — Other Ambulatory Visit: Payer: Self-pay

## 2013-07-15 ENCOUNTER — Other Ambulatory Visit: Payer: Self-pay | Admitting: Internal Medicine

## 2013-07-15 NOTE — Telephone Encounter (Signed)
Metoprolol refilled per protocol. JG//CMA 

## 2013-07-26 ENCOUNTER — Encounter: Payer: Self-pay | Admitting: Internal Medicine

## 2013-07-26 ENCOUNTER — Ambulatory Visit (INDEPENDENT_AMBULATORY_CARE_PROVIDER_SITE_OTHER): Payer: 59 | Admitting: Internal Medicine

## 2013-07-26 VITALS — BP 147/84 | HR 90 | Temp 98.5°F | Wt 301.8 lb

## 2013-07-26 DIAGNOSIS — I10 Essential (primary) hypertension: Secondary | ICD-10-CM

## 2013-07-26 DIAGNOSIS — E785 Hyperlipidemia, unspecified: Secondary | ICD-10-CM | POA: Insufficient documentation

## 2013-07-26 DIAGNOSIS — R7309 Other abnormal glucose: Secondary | ICD-10-CM | POA: Insufficient documentation

## 2013-07-26 MED ORDER — METOPROLOL SUCCINATE ER 50 MG PO TB24: ORAL_TABLET | ORAL | Status: DC

## 2013-07-26 NOTE — Progress Notes (Signed)
   Subjective:    Patient ID: Timothy Mckinney, male    DOB: [birth year], 63 y.o.   MRN: [id number]  HPI Does not check BP at home. Compliant with anti hypertemsive medication. No lightheadedness or other adverse medication effect described.       Review of Systems No ignificant headaches, epistaxis, chest pain, palpitations, exertional dyspnea, claudication, paroxysmal nocturnal dyspnea, or edema. Has been dieting since first of year. Has lost 7 lbs on modified Weight Watchers diet. Plans to start walking after he has lost 15 lbs. Last eye exam 05/2013. No retinopathy.     Objective:   Physical Exam  in no acute distress;weight excess  No carotid bruits are present.No neck pain distention present at 10 - 15 degrees. Thyroid normal to palpation  Heart rhythm and rate are normal with no significant murmurs .S4 gallop.  Chest is clear with no increased work of breathing  There is no evidence of aortic aneurysm or renal artery bruits  Abdomen protuberant but soft with no organomegaly or masses. No HJR  No clubbing or cyanosis.Trace sock line edema present.  Pedal pulses are intact   No ischemic skin changes are present . Nails healthy  Alert and oriented. Strength, tone, DTRs reflexes normal          Assessment & Plan:  See Current Assessment & Plan in Problem List under specific Diagnosis

## 2013-07-26 NOTE — Progress Notes (Signed)
Pre visit review using our clinic review tool, if applicable. No additional management support is needed unless otherwise documented below in the visit note. 

## 2013-07-26 NOTE — Patient Instructions (Signed)
Your next office appointment will be determined based upon review of your pending labs . Those instructions will be transmitted to you through My Chart  or by mail if you're not using this system.   Minimal Blood Pressure Goal= AVERAGE < 140/90;  Ideal is an AVERAGE < 135/85. This AVERAGE should be calculated from @ least 5-7 BP readings taken @ different times of day on different days of week. You should not respond to isolated BP readings , but rather the AVERAGE for that week .Please bring your  blood pressure cuff to office visits to verify that it is reliable.It  can also be checked against the blood pressure device at the pharmacy. Finger or wrist cuffs are not dependable; an arm cuff is. 

## 2013-07-27 ENCOUNTER — Telehealth: Payer: Self-pay | Admitting: Internal Medicine

## 2013-07-27 NOTE — Telephone Encounter (Signed)
Relevant patient education assigned to patient using Emmi. ° °

## 2013-10-18 ENCOUNTER — Other Ambulatory Visit (HOSPITAL_COMMUNITY): Payer: Self-pay | Admitting: Urology

## 2013-10-18 DIAGNOSIS — R109 Unspecified abdominal pain: Secondary | ICD-10-CM

## 2013-10-22 ENCOUNTER — Ambulatory Visit (HOSPITAL_COMMUNITY)
Admission: RE | Admit: 2013-10-22 | Discharge: 2013-10-22 | Disposition: A | Discharge status: Home / Self Care | Payer: 59 | Source: Ambulatory Visit | Attending: Urology | Admitting: Urology

## 2013-10-22 DIAGNOSIS — N200 Calculus of kidney: Secondary | ICD-10-CM | POA: Insufficient documentation

## 2013-10-22 DIAGNOSIS — R109 Unspecified abdominal pain: Secondary | ICD-10-CM

## 2014-02-17 ENCOUNTER — Encounter: Payer: Self-pay | Admitting: Gastroenterology

## 2014-02-17 ENCOUNTER — Encounter: Payer: Self-pay | Admitting: Family Medicine

## 2014-04-06 ENCOUNTER — Telehealth: Payer: Self-pay | Admitting: Internal Medicine

## 2014-04-06 ENCOUNTER — Telehealth: Payer: Self-pay | Admitting: *Deleted

## 2014-04-06 DIAGNOSIS — I10 Essential (primary) hypertension: Secondary | ICD-10-CM

## 2014-04-06 MED ORDER — METOPROLOL SUCCINATE ER 50 MG PO TB24: ORAL_TABLET | ORAL | Status: DC

## 2014-04-06 NOTE — Telephone Encounter (Signed)
ERROR

## 2014-04-06 NOTE — Telephone Encounter (Signed)
Pt states he is needing rx [facility] on his metoprolol. They had been sending request to the [place] office. Inform pt that office has been closed, but we can send rx to [facility]...Timothy Mckinney

## 2014-04-07 ENCOUNTER — Telehealth: Payer: Self-pay | Admitting: Internal Medicine

## 2014-04-07 NOTE — Telephone Encounter (Signed)
emmi emailed °

## 2014-08-05 ENCOUNTER — Encounter: Payer: Self-pay | Admitting: Family Medicine

## 2014-08-05 ENCOUNTER — Ambulatory Visit (INDEPENDENT_AMBULATORY_CARE_PROVIDER_SITE_OTHER): Payer: Medicare Other | Admitting: Family Medicine

## 2014-08-05 ENCOUNTER — Other Ambulatory Visit: Payer: Medicare Other

## 2014-08-05 DIAGNOSIS — R790 Abnormal level of blood mineral: Secondary | ICD-10-CM

## 2014-08-05 DIAGNOSIS — E669 Obesity, unspecified: Secondary | ICD-10-CM | POA: Insufficient documentation

## 2014-08-05 DIAGNOSIS — Z23 Encounter for immunization: Secondary | ICD-10-CM

## 2014-08-05 DIAGNOSIS — I10 Essential (primary) hypertension: Secondary | ICD-10-CM | POA: Insufficient documentation

## 2014-08-05 LAB — CBC WITH DIFFERENTIAL/PLATELET
BASOS ABS: 0 10*3/uL (ref 0.0–0.1)
Basophils Relative: 0.6 % (ref 0.0–3.0)
Eosinophils Absolute: 0.3 10*3/uL (ref 0.0–0.7)
Eosinophils Relative: 6.4 % — ABNORMAL HIGH (ref 0.0–5.0)
HEMATOCRIT: 44 % (ref 39.0–52.0)
Hemoglobin: 15.2 g/dL (ref 13.0–17.0)
LYMPHS ABS: 1 10*3/uL (ref 0.7–4.0)
Lymphocytes Relative: 25.2 % (ref 12.0–46.0)
MCHC: 34.6 g/dL (ref 30.0–36.0)
MCV: 102.5 fl — AB (ref 78.0–100.0)
Monocytes Absolute: 0.4 10*3/uL (ref 0.1–1.0)
Monocytes Relative: 10.6 % (ref 3.0–12.0)
Neutro Abs: 2.4 10*3/uL (ref 1.4–7.7)
Neutrophils Relative %: 57.2 % (ref 43.0–77.0)
Platelets: 136 10*3/uL — ABNORMAL LOW (ref 150.0–400.0)
RBC: 4.29 Mil/uL (ref 4.22–5.81)
RDW: 13.9 % (ref 11.5–15.5)
WBC: 4.1 10*3/uL (ref 4.0–10.5)

## 2014-08-05 LAB — BASIC METABOLIC PANEL
BUN: 15 mg/dL (ref 6–23)
CHLORIDE: 101 meq/L (ref 96–112)
CO2: 27 meq/L (ref 19–32)
CREATININE: 0.82 mg/dL (ref 0.40–1.50)
Calcium: 9.4 mg/dL (ref 8.4–10.5)
GFR: 100.08 mL/min (ref >60.00)
Glucose, Bld: 115 mg/dL — ABNORMAL HIGH (ref 70–99)
Potassium: 4.6 mEq/L (ref 3.5–5.1)
Sodium: 134 mEq/L — ABNORMAL LOW (ref 135–145)

## 2014-08-05 LAB — LIPID PANEL
Cholesterol: 230 mg/dL — ABNORMAL HIGH (ref 0–200)
HDL: 45.6 mg/dL (ref >39.00)
LDL Cholesterol: 158 mg/dL — ABNORMAL HIGH (ref 0–99)
NONHDL: 184.4
TRIGLYCERIDES: 130 mg/dL (ref 0.0–149.0)
Total CHOL/HDL Ratio: 5
VLDL: 26 mg/dL (ref 0.0–40.0)

## 2014-08-05 LAB — HEPATIC FUNCTION PANEL
ALBUMIN: 4.1 g/dL (ref 3.5–5.2)
ALT: 90 U/L — AB (ref 0–53)
AST: 126 U/L — ABNORMAL HIGH (ref 0–37)
Alkaline Phosphatase: 55 U/L (ref 39–117)
BILIRUBIN TOTAL: 0.9 mg/dL (ref 0.2–1.2)
Bilirubin, Direct: 0.2 mg/dL (ref 0.0–0.3)
Total Protein: 7.4 g/dL (ref 6.0–8.3)

## 2014-08-05 LAB — HEMOGLOBIN A1C: HEMOGLOBIN A1C: 5.5 % (ref 4.6–6.5)

## 2014-08-05 LAB — HEPATITIS PANEL, ACUTE
HCV AB: NEGATIVE
HEP A IGM: NONREACTIVE
HEP B C IGM: NONREACTIVE
HEP B S AG: NEGATIVE

## 2014-08-05 LAB — TSH: TSH: 2.67 u[IU]/mL (ref 0.35–4.50)

## 2014-08-05 NOTE — Patient Instructions (Signed)
Schedule your complete physical in 3-4 months We'll notify you of your lab results and make any changes if needed Please consider Weight Watchers or something similar- if you decide you want a referral to a nutritionist please call me Call with any questions or concerns Welcome!  We're glad to have you!!!

## 2014-08-05 NOTE — Progress Notes (Signed)
Pre visit review using our clinic review tool, if applicable. No additional management support is needed unless otherwise documented below in the visit note. 

## 2014-08-05 NOTE — Progress Notes (Signed)
   Subjective:    Patient ID: Timothy Mckinney, male    DOB: [birth year], 64 y.o.   MRN: [id number]  HPI New to establish.  Previous MD- Hopp  HTN- chronic problem.  Currently on metoprolol.  Adequate control.  Asymptomatic- denies CP, SOB, HAs, visual changes, edema.  Last check was 120/70 at [facility].    Obesity- chronic problem, pt's BMI is now 42.79.  Has not had recent labs.  Pt reports CBG was high at urology and again when fasting (114).  Has not been exercising due to recent knee replacement.  'i'm miserable with the weight'.  Wife and daughter have joined Weight [employer] and have lost 64 and 40 lbs respectively.   Review of Systems For ROS see HPI   Reviewed meds, allergies, problem list, and PMH in chart     Objective:   Physical Exam  Constitutional: He is oriented to person, place, and time. He appears well-developed and well-nourished. No distress.  obese  HENT:  Head: Normocephalic and atraumatic.  Eyes: Conjunctivae and EOM are normal. Pupils are equal, round, and reactive to light.  Neck: Normal range of motion. Neck supple. No thyromegaly present.  Cardiovascular: Normal rate, regular rhythm, normal heart sounds and intact distal pulses.   No murmur heard. Pulmonary/Chest: Effort normal and breath sounds normal. No respiratory distress.  Abdominal: Soft. Bowel sounds are normal. He exhibits no distension.  Musculoskeletal: He exhibits no edema.  Lymphadenopathy:    He has no cervical adenopathy.  Neurological: He is alert and oriented to person, place, and time. No cranial nerve deficit.  Skin: Skin is warm and dry.  Psychiatric: He has a normal mood and affect. His behavior is normal.  Vitals reviewed.         Assessment & Plan:

## 2014-08-07 NOTE — Assessment & Plan Note (Signed)
Pt continues to gain weight, worsening his obesity situation.  Not exercising.  Not making healthy food choices.  Has not had labs done recently- will need labs to risk stratify for hyperlipidemia, diabetes.  Strongly encouraged healthy diet, regular exercise.  Will follow.

## 2014-08-07 NOTE — Assessment & Plan Note (Signed)
Chronic problem.  Adequate control.  Asymptomatic.  Check labs to risk stratify.  No anticipated med changes at this time.  Reviewed need for healthy diet, regular exercise, weight loss.  Will follow.

## 2014-08-25 ENCOUNTER — Other Ambulatory Visit: Payer: Self-pay | Admitting: Internal Medicine

## 2014-08-26 ENCOUNTER — Other Ambulatory Visit: Payer: Self-pay | Admitting: Family Medicine

## 2014-08-26 DIAGNOSIS — I10 Essential (primary) hypertension: Secondary | ICD-10-CM

## 2014-08-26 MED ORDER — METOPROLOL SUCCINATE ER 50 MG PO TB24: ORAL_TABLET | ORAL | Status: DC

## 2014-08-26 NOTE — Telephone Encounter (Signed)
Caller name: keyler Relation to pt: self Call back number: [phone] Pharmacy: [facility]  Reason for call:   Requesting metoprolol refill

## 2014-08-26 NOTE — Telephone Encounter (Signed)
Refill e-scribed to [facility] per protocol. JG//CMA

## 2014-10-15 ENCOUNTER — Encounter (HOSPITAL_COMMUNITY): Payer: Self-pay | Admitting: Emergency Medicine

## 2014-10-15 ENCOUNTER — Inpatient Hospital Stay (HOSPITAL_COMMUNITY)
Admission: EM | Admit: 2014-10-15 | Discharge: 2014-10-17 | DRG: 313 | Disposition: A | Discharge status: Home / Self Care | Payer: Medicare Other | Attending: Internal Medicine | Admitting: Internal Medicine

## 2014-10-15 ENCOUNTER — Emergency Department (HOSPITAL_COMMUNITY): Payer: Medicare Other

## 2014-10-15 DIAGNOSIS — Z79899 Other long term (current) drug therapy: Secondary | ICD-10-CM | POA: Diagnosis not present

## 2014-10-15 DIAGNOSIS — K219 Gastro-esophageal reflux disease without esophagitis: Secondary | ICD-10-CM | POA: Diagnosis present

## 2014-10-15 DIAGNOSIS — D693 Immune thrombocytopenic purpura: Secondary | ICD-10-CM | POA: Diagnosis present

## 2014-10-15 DIAGNOSIS — I209 Angina pectoris, unspecified: Secondary | ICD-10-CM

## 2014-10-15 DIAGNOSIS — R0600 Dyspnea, unspecified: Secondary | ICD-10-CM

## 2014-10-15 DIAGNOSIS — M25569 Pain in unspecified knee: Secondary | ICD-10-CM | POA: Insufficient documentation

## 2014-10-15 DIAGNOSIS — R079 Chest pain, unspecified: Secondary | ICD-10-CM | POA: Insufficient documentation

## 2014-10-15 DIAGNOSIS — Z880 Allergy status to penicillin: Secondary | ICD-10-CM | POA: Diagnosis not present

## 2014-10-15 DIAGNOSIS — Z6841 Body Mass Index (BMI) 40.0 and over, adult: Secondary | ICD-10-CM | POA: Diagnosis not present

## 2014-10-15 DIAGNOSIS — E669 Obesity, unspecified: Secondary | ICD-10-CM | POA: Diagnosis present

## 2014-10-15 DIAGNOSIS — M25562 Pain in left knee: Secondary | ICD-10-CM | POA: Diagnosis present

## 2014-10-15 DIAGNOSIS — M545 Low back pain, unspecified: Secondary | ICD-10-CM

## 2014-10-15 DIAGNOSIS — I10 Essential (primary) hypertension: Secondary | ICD-10-CM | POA: Diagnosis present

## 2014-10-15 DIAGNOSIS — Z96659 Presence of unspecified artificial knee joint: Secondary | ICD-10-CM | POA: Diagnosis present

## 2014-10-15 DIAGNOSIS — K210 Gastro-esophageal reflux disease with esophagitis: Secondary | ICD-10-CM

## 2014-10-15 DIAGNOSIS — Z7951 Long term (current) use of inhaled steroids: Secondary | ICD-10-CM

## 2014-10-15 DIAGNOSIS — E790 Hyperuricemia without signs of inflammatory arthritis and tophaceous disease: Secondary | ICD-10-CM

## 2014-10-15 DIAGNOSIS — R918 Other nonspecific abnormal finding of lung field: Secondary | ICD-10-CM | POA: Diagnosis present

## 2014-10-15 DIAGNOSIS — K760 Fatty (change of) liver, not elsewhere classified: Secondary | ICD-10-CM | POA: Diagnosis present

## 2014-10-15 DIAGNOSIS — R0789 Other chest pain: Principal | ICD-10-CM | POA: Diagnosis present

## 2014-10-15 DIAGNOSIS — H04129 Dry eye syndrome of unspecified lacrimal gland: Secondary | ICD-10-CM | POA: Diagnosis present

## 2014-10-15 DIAGNOSIS — E785 Hyperlipidemia, unspecified: Secondary | ICD-10-CM | POA: Diagnosis present

## 2014-10-15 HISTORY — DX: Immune thrombocytopenic purpura: D69.3

## 2014-10-15 HISTORY — DX: Other intervertebral disc degeneration, lumbar region: M51.36

## 2014-10-15 HISTORY — DX: Other intervertebral disc degeneration, lumbar region without mention of lumbar back pain or lower extremity pain: M51.369

## 2014-10-15 LAB — BASIC METABOLIC PANEL
Anion gap: 15 (ref 5–15)
BUN: 11 mg/dL (ref 6–23)
CHLORIDE: 97 mmol/L (ref 96–112)
CO2: 25 mmol/L (ref 19–32)
Calcium: 9.1 mg/dL (ref 8.4–10.5)
Creatinine, Ser: 0.99 mg/dL (ref 0.50–1.35)
GFR calc non Af Amer: 84 mL/min — ABNORMAL LOW (ref >90)
Glucose, Bld: 142 mg/dL — ABNORMAL HIGH (ref 70–99)
Potassium: 4.7 mmol/L (ref 3.5–5.1)
SODIUM: 137 mmol/L (ref 135–145)

## 2014-10-15 LAB — URINALYSIS, ROUTINE W REFLEX MICROSCOPIC
Bilirubin Urine: NEGATIVE
GLUCOSE, UA: NEGATIVE mg/dL
Hgb urine dipstick: NEGATIVE
KETONES UR: NEGATIVE mg/dL
Leukocytes, UA: NEGATIVE
NITRITE: NEGATIVE
Protein, ur: NEGATIVE mg/dL
Specific Gravity, Urine: 1.019 (ref 1.005–1.030)
UROBILINOGEN UA: 0.2 mg/dL (ref 0.0–1.0)
pH: 7.5 (ref 5.0–8.0)

## 2014-10-15 LAB — I-STAT TROPONIN, ED: TROPONIN I, POC: 0.01 ng/mL (ref 0.00–0.08)

## 2014-10-15 LAB — CBC
HEMATOCRIT: 46.8 % (ref 39.0–52.0)
Hemoglobin: 16.1 g/dL (ref 13.0–17.0)
MCH: 35.7 pg — ABNORMAL HIGH (ref 26.0–34.0)
MCHC: 34.4 g/dL (ref 30.0–36.0)
MCV: 103.8 fL — AB (ref 78.0–100.0)
Platelets: 125 10*3/uL — ABNORMAL LOW (ref 150–400)
RBC: 4.51 MIL/uL (ref 4.22–5.81)
RDW: 13.1 % (ref 11.5–15.5)
WBC: 3.6 10*3/uL — AB (ref 4.0–10.5)

## 2014-10-15 LAB — TROPONIN I: Troponin I: 0.04 ng/mL — ABNORMAL HIGH (ref <0.031)

## 2014-10-15 LAB — BRAIN NATRIURETIC PEPTIDE: B NATRIURETIC PEPTIDE 5: 59.3 pg/mL (ref 0.0–100.0)

## 2014-10-15 MED ORDER — ALPRAZOLAM 0.25 MG PO TABS
0.5000 mg | ORAL_TABLET | Freq: Once | ORAL | Status: AC
  Administered 2014-10-15: 0.5 mg via ORAL
  Filled 2014-10-15: qty 2

## 2014-10-15 MED ORDER — ACETAMINOPHEN 325 MG PO TABS
650.0000 mg | ORAL_TABLET | ORAL | Status: DC | PRN
  Administered 2014-10-15: 650 mg via ORAL
  Filled 2014-10-15: qty 2

## 2014-10-15 MED ORDER — ZOLPIDEM TARTRATE 5 MG PO TABS
5.0000 mg | ORAL_TABLET | Freq: Every evening | ORAL | Status: DC | PRN
  Administered 2014-10-15 – 2014-10-16 (×2): 5 mg via ORAL
  Filled 2014-10-15 (×2): qty 1

## 2014-10-15 MED ORDER — METOPROLOL SUCCINATE ER 50 MG PO TB24
50.0000 mg | ORAL_TABLET | Freq: Every day | ORAL | Status: DC
Start: 2014-10-15 — End: 2014-10-17
  Administered 2014-10-15 – 2014-10-17 (×3): 50 mg via ORAL
  Filled 2014-10-15 (×3): qty 1

## 2014-10-15 MED ORDER — FLAXSEED OIL MAX STR 1300 MG PO CAPS: 2.0000 | ORAL_CAPSULE | Freq: Every day | ORAL | Status: DC

## 2014-10-15 MED ORDER — MORPHINE SULFATE 2 MG/ML IJ SOLN: 2.0000 mg | INTRAMUSCULAR | Status: DC | PRN

## 2014-10-15 MED ORDER — ENOXAPARIN SODIUM 40 MG/0.4ML ~~LOC~~ SOLN
40.0000 mg | SUBCUTANEOUS | Status: DC
  Administered 2014-10-15 – 2014-10-16 (×2): 40 mg via SUBCUTANEOUS
  Filled 2014-10-15 (×2): qty 0.4

## 2014-10-15 MED ORDER — CYCLOSPORINE 0.05 % OP EMUL
1.0000 [drp] | Freq: Every day | OPHTHALMIC | Status: DC
  Administered 2014-10-16 – 2014-10-17 (×2): 1 [drp] via OPHTHALMIC
  Filled 2014-10-15 (×3): qty 1

## 2014-10-15 MED ORDER — MORPHINE SULFATE 4 MG/ML IJ SOLN: 4.0000 mg | INTRAMUSCULAR | Status: DC | PRN

## 2014-10-15 MED ORDER — POTASSIUM CITRATE ER 10 MEQ (1080 MG) PO TBCR
20.0000 meq | EXTENDED_RELEASE_TABLET | Freq: Two times a day (BID) | ORAL | Status: DC
  Administered 2014-10-15 – 2014-10-17 (×4): 20 meq via ORAL
  Filled 2014-10-15 (×5): qty 2

## 2014-10-15 MED ORDER — IOHEXOL 350 MG/ML SOLN
100.0000 mL | Freq: Once | INTRAVENOUS | Status: AC | PRN
  Administered 2014-10-15: 100 mL via INTRAVENOUS

## 2014-10-15 MED ORDER — ALPRAZOLAM 0.25 MG PO TABS
0.2500 mg | ORAL_TABLET | Freq: Three times a day (TID) | ORAL | Status: DC | PRN
  Administered 2014-10-15 – 2014-10-16 (×2): 0.25 mg via ORAL
  Filled 2014-10-15 (×2): qty 1

## 2014-10-15 MED ORDER — NITROGLYCERIN 0.4 MG SL SUBL: 0.4000 mg | SUBLINGUAL_TABLET | SUBLINGUAL | Status: DC | PRN

## 2014-10-15 MED ORDER — ONDANSETRON HCL 4 MG/2ML IJ SOLN: 4.0000 mg | Freq: Four times a day (QID) | INTRAMUSCULAR | Status: DC | PRN

## 2014-10-15 MED ORDER — CELECOXIB 200 MG PO CAPS
200.0000 mg | ORAL_CAPSULE | Freq: Every day | ORAL | Status: DC
  Administered 2014-10-15 – 2014-10-17 (×3): 200 mg via ORAL
  Filled 2014-10-15 (×3): qty 1

## 2014-10-15 MED ORDER — GI COCKTAIL ~~LOC~~: 30.0000 mL | Freq: Four times a day (QID) | ORAL | Status: DC | PRN

## 2014-10-15 MED ORDER — ASPIRIN EC 325 MG PO TBEC
325.0000 mg | DELAYED_RELEASE_TABLET | Freq: Every day | ORAL | Status: DC
  Administered 2014-10-15 – 2014-10-17 (×3): 325 mg via ORAL
  Filled 2014-10-15 (×3): qty 1

## 2014-10-15 NOTE — ED Notes (Signed)
Admitting MD at bedside.

## 2014-10-15 NOTE — H&P (Signed)
[facility] History and Physical                                                                                    Timothy Mckinney, is a 64 y.o. male  MRN: [id number]   DOB - June 30, 1951  Admit Date - 10/15/2014  Outpatient Primary MD for the patient is [name], MD  With History of -  Past Medical History  Diagnosis Date  . Dry eyes, bilateral     Dr Bing Plume  . Nephrolithiasis     Dr Serita Butcher  . Hyperuricemia   . Abnormal chest x-ray     ? granulomata  . Hypertension     NEW DIAGNOSIS AND STARTED ON METOPROLOL ON 10/04/11  . Elevated liver enzymes     HISTORY OF ELEVATED LIVER ENZYMES  . Thrombocytopenia     HX OF LOW PLATELET COUNT  . Sleep apnea     STOP BANG SCORE OF 4  . GERD (gastroesophageal reflux disease)     hx of   . Arthritis     knees and back   . DDD (degenerative disc disease), lumbar     MRI 08/2005  . Idiopathic thrombocytopenia 10/15/2014      Past Surgical History  Procedure Laterality Date  . Cardiac catheterization      in 42s, negative  . Tear duct plugs    . Knee arthroscopy  2009     L  . Colonoscopy w/ polypectomy  2011    Dr Sharlett Iles  . Colon ulcer  2011  . Total knee arthroplasty  2011    Workman's Comp case, Dr Veverly Fells  . Lithotripsy    . Knee surgery      april 2013 total knee replacement removed  . Anal fistulectomy  06/01/2012    Procedure: FISTULECTOMY ANAL;  Surgeon: Leighton Ruff, MD;  Location: [facility];  Service: General;  Laterality: N/A;  Mucosal Advancement Flap     in for   Chief Complaint  Patient presents with  . Shortness of Breath  . Chest Pain  . Back Pain     HPI 64 year old male patient with past medical history of hypertension, dyslipidemia, obesity, known pulmonary nodules with adenopathy suspicious for sarcoid, chronic stable transaminitis, intermittent idiopathic thrombocytopenia, and chronic dry eye. Patient presented to the ER with reports of chest discomfort. He reports that beginning this  past Tuesday he noticed left anterior chest pain while stretching. He described this pain as dull. He said the duration was for a few minutes. He was short of breath with this pain and this has not happened before. The pain resolved independent of any treatment. The pain returned this past Friday (yesterday) described it as being more intense but otherwise similar. He did not have any further pain yesterday but was awakened this a.m. around 2 and noticed he was sweating and having similar chest discomfort. He was unable to sleep status up to about 5 AM. Became short of breath somewhat after walking to the bathroom. Overnight he also had symptoms of nausea and chills. No fevers. He said he felt mucousy last week but no other upper respiratory symptoms, no gastrointestinal  symptoms such as emesis or diarrhea. He states he did eat [place] style food last night.  Patient's wife insisted he present to the ER. Upon arrival to the ER an EKG was obtained which did not show any acute ischemic changes. Initial troponin was normal. Two-view chest x-ray was unremarkable for cardiopulmonary issues. During the EDP's exam patient had reproducible low back pain that radiated up the back and question early into the chest. There were concerns patient may be experiencing either a PE (given his presentation with chest pain) or an aortic dissection (given his history of hypertension) so CT of abdomen and pelvis and chest was obtained. There was no evidence of PE. There were stable changes in regards to multiple small pulmonary nodules with mediastinal and hilar adenopathy. There was diffuse hepatic steatosis consistent with fatty liver disease. Patient was noted to be tremulous in his arms but wife reported that this is not uncommon for this patient.  Review of Systems   In addition to the HPI above,  No Fever-chills, myalgias or other constitutional symptoms No Headache, changes with Vision or hearing, new weakness, tingling,  numbness in any extremity, No problems swallowing food or Liquids, indigestion/reflux No Chest pain, Cough or Shortness of Breath, palpitations, orthopnea or DOE No Abdominal pain, N/V; no melena or hematochezia, no dark tarry stools, Bowel movements are regular, No dysuria, hematuria or flank pain No new skin rashes, lesions, masses or bruises, No new joints pains-aches No recent weight gain or loss No polyuria, polydypsia or polyphagia,  *A full 10 point Review of Systems was done, except as stated above, all other Review of Systems were negative.  Social History History  Substance Use Topics  . Smoking status: Never Smoker   . Smokeless tobacco: Never Used  . Alcohol Use: No     Comment:  since 1/15    Family History Family History  Problem Relation Age of Onset  . Heart attack Father 27  . Cancer Paternal Grandfather     throat , pipe smoker  . Colon polyps Mother   . Dementia Mother     died post SDH  . Stroke Neg Hx     Prior to Admission medications   Medication Sig Start Date End Date Taking? Authorizing Provider  celecoxib (CELEBREX) 200 MG capsule Take 200 mg by mouth daily.   Yes Historical Provider, MD  cycloSPORINE (RESTASIS) 0.05 % ophthalmic emulsion Place 1 drop into both eyes daily.   Yes Historical Provider, MD  diphenhydrAMINE (BENADRYL) 25 MG tablet Take 25 mg by mouth. ONE AT BEDTIME IF NEEDED SLEEP   Yes Historical Provider, MD  Flaxseed, Linseed, (FLAXSEED OIL MAX STR) 1300 MG CAPS Take 2 capsules by mouth daily.   Yes Historical Provider, MD  lactobacillus acidophilus (BACID) TABS Take 1 tablet by mouth daily.   Yes Historical Provider, MD  metoprolol succinate (TOPROL-XL) 50 MG 24 hr tablet TAKE 1 TABLET BY MOUTH DAILY 08/26/14  Yes [name], MD  potassium citrate (UROCIT-K) 10 MEQ (1080 MG) SR tablet Take 20 mEq by mouth 2 (two) times daily between meals.   Yes Historical Provider, MD  albuterol (PROVENTIL HFA;VENTOLIN HFA) 108 (90 BASE)  MCG/ACT inhaler Inhale 2 puffs into the lungs every 6 (six) hours as needed for wheezing. Patient not taking: Reported on 08/05/2014 11/16/12   [name], MD    Allergies  Allergen Reactions  . Penicillins Swelling    Diffuse swelling    Physical Exam  Vitals  Blood pressure 152/63, pulse 82, temperature 98.1 F (36.7 C), temperature source Oral, resp. rate 20, SpO2 94 %.   General:  In no acute distress, appears healthy and well nourished  Psych:  Normal affect but anxious, Awake Alert, Oriented X 3. Speech and thought patterns are clear and appropriate, no apparent short term memory deficits  Neuro:   No focal neurological deficits, CN II through XII intact, Strength 5/5 all 4 extremities, Sensation intact all 4 extremities. Upper extremity tremulousness that appears at this juncture he more consistent with anxiety noting tremulousness diminishes and then resolves once patient becomes less "stressed"  ENT:  Ears and Eyes appear Normal, Conjunctivae clear, PER. Moist oral mucosa without erythema or exudates.  Neck:  Supple, No lymphadenopathy appreciated  Respiratory:  Symmetrical chest wall movement, Good air movement bilaterally, CTAB. Room Air  Cardiac:  RRR, No Murmurs, no LE edema noted, no JVD, No carotid bruits, peripheral pulses palpable at 2+  Abdomen:  Positive bowel sounds, Soft and somewhat obese, Non tender, Non distended,  No masses appreciated, no obvious hepatosplenomegaly  Skin:  No Cyanosis, Normal Skin Turgor, No Bruise. Fine maculopapular rash involving chest and upper extremities  Extremities: Symmetrical without obvious trauma or injury,  no effusions.  Data Review  CBC  Recent Labs Lab 10/15/14 0845  WBC 3.6*  HGB 16.1  HCT 46.8  PLT 125*  MCV 103.8*  MCH 35.7*  MCHC 34.4  RDW 13.1    Chemistries   Recent Labs Lab 10/15/14 0845  NA 137  K 4.7  CL 97  CO2 25  GLUCOSE 142*  BUN 11  CREATININE 0.99  CALCIUM 9.1    CrCl  cannot be calculated (Unknown ideal weight.).  No results for input(s): TSH, T4TOTAL, T3FREE, THYROIDAB in the last 72 hours.  Invalid input(s): FREET3  Coagulation profile No results for input(s): INR, PROTIME in the last 168 hours.  No results for input(s): DDIMER in the last 72 hours.  Cardiac Enzymes No results for input(s): CKMB, TROPONINI, MYOGLOBIN in the last 168 hours.  Invalid input(s): CK  Invalid input(s): POCBNP  Urinalysis    Component Value Date/Time   COLORURINE AMBER* 10/15/2014 1020   APPEARANCEUR CLEAR 10/15/2014 1020   LABSPEC 1.019 10/15/2014 1020   PHURINE 7.5 10/15/2014 1020   GLUCOSEU NEGATIVE 10/15/2014 1020   HGBUR NEGATIVE 10/15/2014 1020   BILIRUBINUR NEGATIVE 10/15/2014 1020   KETONESUR NEGATIVE 10/15/2014 1020   PROTEINUR NEGATIVE 10/15/2014 1020   UROBILINOGEN 0.2 10/15/2014 1020   NITRITE NEGATIVE 10/15/2014 1020   LEUKOCYTESUR NEGATIVE 10/15/2014 1020    Imaging results:   Dg Chest 2 View  10/15/2014   CLINICAL DATA:  64 year old male with a history of shortness of breath and chest pain.  EXAM: CHEST - 2 VIEW  COMPARISON:  CT abdomen 02/08/2014, chest x-ray 10/16/2011 and 10/08/2011  FINDINGS: Cardiomediastinal silhouette unchanged in size and contour with borderline cardiomegaly.  Asymmetric elevation of the right hemidiaphragm.  Atherosclerotic changes of the aortic arch.  Interval removal of right-sided PICC.  No pneumothorax or pleural effusion.  No confluent airspace disease.  No displaced fracture.  Lateral view demonstrates anterior osteophyte production, although there is underlying syndesmophyte production, present on the current and on comparison abdominal CT and plain film. Early bridging osteophyte at the sacroiliac joint on the prior CT abdomen.  IMPRESSION: No radiographic evidence of acute cardiopulmonary disease.  Atherosclerosis.  Degenerative changes of the spine including syndesmophyte production. There is early bridging  osteophyte production at  the sacroiliac joint on prior CT pelvis. These changes can be seen with rheumatologic conditions such as ankylosing spondylitis. Elective referral for rheumatologic evaluation may be useful if not already completed.  Signed,  Dulcy Fanny. Earleen Newport, DO  [facility]  [facility]   Electronically Signed   By: Corrie Mckusick D.O.   On: 10/15/2014 09:56   Ct Angio Chest Pe W/cm &/or Wo Cm  10/15/2014   CLINICAL DATA:  Shortness of breath for several dayds; chest pain one day; 0200 this morning cou.ld not get back to sleep due to discomfort. Was getting diaphoretic and anxious and nauseated. HX of stones but does not feel like it. No h/o PE  EXAM: CT ANGIOGRAPHY CHEST  CT ABDOMEN AND PELVIS WITH CONTRAST  TECHNIQUE: Multidetector CT imaging of the chest was performed using the standard protocol during bolus administration of intravenous contrast. Multiplanar CT image reconstructions and MIPs were obtained to evaluate the vascular anatomy. Multidetector CT imaging of the abdomen and pelvis was performed using the standard protocol during bolus administration of intravenous contrast.  CONTRAST:  110mL OMNIPAQUE IOHEXOL 350 MG/ML SOLN  COMPARISON:  Current chest radiographs. Previous abdomen and pelvis CT, 02/08/2014  FINDINGS: CTA CHEST FINDINGS  Angiographic study:  No evidence of a pulmonary embolus.  Mediastinum and hila: Heart is normal in size and configuration. There are mild coronary artery calcifications. Great vessels are normal in caliber. No aortic dissection.  There is mediastinal and hilar adenopathy, most prominent along the hila. Several reference measurements were made. There is a prevascular node to the left of the left pulmonary artery measuring 18 mm in short axis. A right infrahilar node measures 18 mm in short axis. A left infrahilar node measures 16 mm in short axis.  Lungs and pleura: No lung consolidation or edema. There multiple  pulmonary nodules. Largest on the right measures 7 mm in the anteromedial right upper lobe above the minor fissure. Nodules are seen bilaterally, with an upper lobe predominance mild reticular lung base opacities noted likely subsegmental atelectasis. No pleural effusion. No pneumothorax  CT ABDOMEN and PELVIS FINDINGS  Fatty infiltration of the liver.  No liver mass or focal lesion.  Spleen, gallbladder, pancreas, adrenal glands, kidneys, ureters, bladder: Unremarkable.  No pathologically enlarged lymph nodes. No abnormal fluid collections.  Colon and small bowel are unremarkable.  Normal appendix.  MUSCULOSKELETAL  Degenerative changes noted along the spine. No osteoblastic or osteolytic lesions.  Review of the MIP images confirms the above findings.  IMPRESSION: 1. No evidence of a pulmonary embolus. 2. Mediastinal and hilar adenopathy as well as multiple small pulmonary nodules. The combination findings suggest sarcoidosis. 3. No acute findings in the lungs. 4. No acute findings below the diaphragm. 5. Diffuse hepatic steatosis. 6. No abdominal or pelvic adenopathy.   Electronically Signed   By: Lajean Manes M.D.   On: 10/15/2014 11:37   Ct Abdomen Pelvis W Contrast  10/15/2014   CLINICAL DATA:  Shortness of breath for several dayds; chest pain one day; 0200 this morning cou.ld not get back to sleep due to discomfort. Was getting diaphoretic and anxious and nauseated. HX of stones but does not feel like it. No h/o PE  EXAM: CT ANGIOGRAPHY CHEST  CT ABDOMEN AND PELVIS WITH CONTRAST  TECHNIQUE: Multidetector CT imaging of the chest was performed using the standard protocol during bolus administration of intravenous contrast. Multiplanar CT image reconstructions and MIPs were obtained to evaluate the vascular anatomy. Multidetector CT imaging  of the abdomen and pelvis was performed using the standard protocol during bolus administration of intravenous contrast.  CONTRAST:  157mL OMNIPAQUE IOHEXOL 350 MG/ML SOLN   COMPARISON:  Current chest radiographs. Previous abdomen and pelvis CT, 02/08/2014  FINDINGS: CTA CHEST FINDINGS  Angiographic study:  No evidence of a pulmonary embolus.  Mediastinum and hila: Heart is normal in size and configuration. There are mild coronary artery calcifications. Great vessels are normal in caliber. No aortic dissection.  There is mediastinal and hilar adenopathy, most prominent along the hila. Several reference measurements were made. There is a prevascular node to the left of the left pulmonary artery measuring 18 mm in short axis. A right infrahilar node measures 18 mm in short axis. A left infrahilar node measures 16 mm in short axis.  Lungs and pleura: No lung consolidation or edema. There multiple pulmonary nodules. Largest on the right measures 7 mm in the anteromedial right upper lobe above the minor fissure. Nodules are seen bilaterally, with an upper lobe predominance mild reticular lung base opacities noted likely subsegmental atelectasis. No pleural effusion. No pneumothorax  CT ABDOMEN and PELVIS FINDINGS  Fatty infiltration of the liver.  No liver mass or focal lesion.  Spleen, gallbladder, pancreas, adrenal glands, kidneys, ureters, bladder: Unremarkable.  No pathologically enlarged lymph nodes. No abnormal fluid collections.  Colon and small bowel are unremarkable.  Normal appendix.  MUSCULOSKELETAL  Degenerative changes noted along the spine. No osteoblastic or osteolytic lesions.  Review of the MIP images confirms the above findings.  IMPRESSION: 1. No evidence of a pulmonary embolus. 2. Mediastinal and hilar adenopathy as well as multiple small pulmonary nodules. The combination findings suggest sarcoidosis. 3. No acute findings in the lungs. 4. No acute findings below the diaphragm. 5. Diffuse hepatic steatosis. 6. No abdominal or pelvic adenopathy.   Electronically Signed   By: Lajean Manes M.D.   On: 10/15/2014 11:37     EKG: Sinus rhythm without acute ischemic changes,  borderline QTC 483 ms   Assessment & Plan  Principal Problem:   Chest pain -Admit to telemetry -Heart score is 4 -Consult cardiology; given patient's body habitus if there is any index of suspicion he would require a stress test before discharge this would likely need to be a 2 day study -Cycle cardiac enzymes -Check 2-D echocardiogram -Continue home beta blocker and flaxseed oil -Has chronic mild transaminitis in setting of fatty liver disease so likely rationale to why patient not on statin -Full dose aspirin -Last lipid panel was February 2016: Cholesterol was 230, triglycerides 130, HDL was 45, and LDL was 158  Active Problems:   Pulmonary nodules with adenopathy (since 2001 CT Chest) -Patient reports that has undergone previous evaluation for possible pulmonary sarcoid but since was asymptomatic evaluating physician opted to not pursue initiation of steroid therapy -Patient was offered fiber optic bronchoscopy evaluation at that time but he declined -Patient encouraged to follow-up with pulmonologist after discharge in event other diagnostic modalities such as high-resolution CT may be helpful -Denies issues with shortness of breath on a chronic basis    Severe obesity (BMI >= 40) -Counseled regarding weight reduction strategies    HTN  -Continue Toprol    Idiopathic thrombocytopenia -Chronic and stable -Likely related to fatty liver disease    GERD -Stable and patient has been off H2 blockers for greater than one year    Hyperuricemia -History of renal calculi and could be a marker for sarcoid    Fatty liver disease,  nonalcoholic -Likely etiology to patient's stable transaminitis     DVT Prophylaxis: Lovenox  Family Communication:  Wife at bedside  Code Status: Full code   Condition:  Stable  Time spent in minutes : 60   Efrem Pitstick L. ANP on 10/15/2014 at 12:49 PM  Between 7am to 7pm - Pager - [phone]  After 7pm go to [email or web address] - password  [email or web address]  And look for the night coverage person covering me after hours  [facility]

## 2014-10-15 NOTE — ED Notes (Signed)
Patient transported to CT without distress 

## 2014-10-15 NOTE — ED Provider Notes (Signed)
CSN: [id number]     Arrival date & time 10/15/14  9381 History   First MD Initiated Contact with Patient 10/15/14 [phone]     Chief Complaint  Patient presents with  . Shortness of Breath  . Chest Pain  . Back Pain      HPI Pt was seen at 0845. Per pt, c/o gradual onset and worsening of persistent SOB for the past several days, worse since overnight last night. Pt also c/o intermittent left sided chest "discomfort," as well as lightheadedness, nausea, and diaphoresis. States his symptoms have been "coming and going" for the past 2 days. States he woke up overnight last night with symptoms approximately 0200 and "couldn't get back to sleep." States he "feels better now." Also states yesterday he began to have right sided LBP. States his LBP "doesn't feel like my kidney stone."  Denies palpitations, no cough, no abd pain, no vomiting/diarrhea, no dysuria/hematuria. Denies incont/retention of bowel or bladder, no saddle anesthesia, no focal motor weakness, no tingling/numbness in extremities, no fevers, no injury.    Past Medical History  Diagnosis Date  . Dry eyes, bilateral     Dr Bing Plume  . Nephrolithiasis     Dr Serita Butcher  . Hyperuricemia   . Abnormal chest x-ray     ? granulomata  . Hypertension     NEW DIAGNOSIS AND STARTED ON METOPROLOL ON 10/04/11  . Elevated liver enzymes     HISTORY OF ELEVATED LIVER ENZYMES  . Thrombocytopenia     HX OF LOW PLATELET COUNT  . Sleep apnea     STOP BANG SCORE OF 4  . GERD (gastroesophageal reflux disease)     hx of   . Arthritis     knees and back   . DDD (degenerative disc disease), lumbar     MRI 08/2005   Past Surgical History  Procedure Laterality Date  . Cardiac catheterization      in 58s, negative  . Tear duct plugs    . Knee arthroscopy  2009     L  . Colonoscopy w/ polypectomy  2011    Dr Sharlett Iles  . Colon ulcer  2011  . Total knee arthroplasty  2011    Workman's Comp case, Dr Veverly Fells  . Lithotripsy    . Knee surgery       april 2013 total knee replacement removed  . Anal fistulectomy  06/01/2012    Procedure: FISTULECTOMY ANAL;  Surgeon: Leighton Ruff, MD;  Location: [facility];  Service: General;  Laterality: N/A;  Mucosal Advancement Flap    Family History  Problem Relation Age of Onset  . Heart attack Father 1  . Cancer Paternal Grandfather     throat , pipe smoker  . Colon polyps Mother   . Dementia Mother     died post SDH  . Stroke Neg Hx    History  Substance Use Topics  . Smoking status: Never Smoker   . Smokeless tobacco: Never Used  . Alcohol Use: No     Comment:  since 1/15    Review of Systems ROS: Statement: All systems negative except as marked or noted in the HPI; Constitutional: Negative for fever and chills. ; ; Eyes: Negative for eye pain, redness and discharge. ; ; ENMT: Negative for ear pain, hoarseness, nasal congestion, sinus pressure and sore throat. ; ; Cardiovascular: +SOB, CP, diaphoresis. Negative for palpitations, and peripheral edema. ; ; Respiratory: Negative for cough, wheezing and stridor. ; ; Gastrointestinal: +nausea.  Negative for vomiting, diarrhea, abdominal pain, blood in stool, hematemesis, jaundice and rectal bleeding. . ; ; Genitourinary: Negative for dysuria, flank pain and hematuria. ; ; Musculoskeletal: +LBP. Negative for neck pain. Negative for swelling and trauma.; ; Skin: Negative for pruritus, rash, abrasions, blisters, bruising and skin lesion.; ; Neuro: Negative for headache, lightheadedness and neck stiffness. Negative for weakness, altered level of consciousness , altered mental status, extremity weakness, paresthesias, involuntary movement, seizure and syncope.     Allergies  Penicillins  Home Medications   Prior to Admission medications   Medication Sig Start Date End Date Taking? Authorizing Provider  celecoxib (CELEBREX) 200 MG capsule Take 200 mg by mouth daily.   Yes Historical Provider, MD  cycloSPORINE (RESTASIS) 0.05 % ophthalmic emulsion Place  1 drop into both eyes daily.   Yes Historical Provider, MD  diphenhydrAMINE (BENADRYL) 25 MG tablet Take 25 mg by mouth. ONE AT BEDTIME IF NEEDED SLEEP   Yes Historical Provider, MD  Flaxseed, Linseed, (FLAXSEED OIL MAX STR) 1300 MG CAPS Take 2 capsules by mouth daily.   Yes Historical Provider, MD  lactobacillus acidophilus (BACID) TABS Take 1 tablet by mouth daily.   Yes Historical Provider, MD  metoprolol succinate (TOPROL-XL) 50 MG 24 hr tablet TAKE 1 TABLET BY MOUTH DAILY 08/26/14  Yes [name], MD  potassium citrate (UROCIT-K) 10 MEQ (1080 MG) SR tablet Take 20 mEq by mouth 2 (two) times daily between meals.   Yes Historical Provider, MD  albuterol (PROVENTIL HFA;VENTOLIN HFA) 108 (90 BASE) MCG/ACT inhaler Inhale 2 puffs into the lungs every 6 (six) hours as needed for wheezing. Patient not taking: Reported on 08/05/2014 11/16/12   [name], MD   BP 160/85 mmHg  Pulse 89  Temp(Src) 98.1 F (36.7 C) (Oral)  Resp 18  SpO2 95% Physical Exam  0850: Physical examination:  Nursing notes reviewed; Vital signs and O2 SAT reviewed;  Constitutional: Well developed, Well nourished, Well hydrated, In no acute distress; Head:  Normocephalic, atraumatic; Eyes: EOMI, PERRL, No scleral icterus; ENMT: Mouth and pharynx normal, Mucous membranes moist; Neck: Supple, Full range of motion, No lymphadenopathy; Cardiovascular: Regular rate and rhythm, No gallop; Respiratory: Breath sounds clear & equal bilaterally, No wheezes.  Speaking full sentences with ease, Normal respiratory effort/excursion; Chest: Nontender, Movement normal; Abdomen: Soft, Nontender, Nondistended, Normal bowel sounds; Genitourinary: No CVA tenderness; Spine:  No midline CS, TS, LS tenderness. +TTP right lumbar paraspinal muscles.;; Extremities: Pulses normal, No tenderness, No edema, No calf edema or asymmetry.; Neuro: AA&Ox3, Major CN grossly intact.  Speech clear. No gross focal motor or sensory deficits in extremities.  Strength 5/5 equal bilat UE's and LE's, including great toe dorsiflexion.  DTR 2/4 equal bilat UE's and LE's.  No gross sensory deficits.  Neg straight leg raises bilat.; Skin: Color normal, Warm, Dry.; Psych:  Anxious.  ED Course  Procedures     EKG Interpretation   Date/Time:  Saturday October 15 2014 08:36:59 EDT Ventricular Rate:  96 PR Interval:  183 QRS Duration: 101 QT Interval:  382 QTC Calculation: 483 R Axis:   -6 Text Interpretation:  Sinus rhythm Low voltage, precordial leads  Borderline prolonged QT interval Baseline wander When compared with ECG of  02/27/2010 No significant change was found Confirmed by [facility]  MD,  Nunzio Cory [phone]) on 10/15/2014 9:03:21 AM      MDM  MDM Reviewed: previous chart, nursing note and vitals Reviewed previous: ECG, labs, ultrasound, MRI and CT scan Interpretation: labs, ECG,  x-ray and CT scan     Results for orders placed or performed during the hospital encounter of 10/15/14  CBC  Result Value Ref Range   WBC 3.6 (L) 4.0 - 10.5 K/uL   RBC 4.51 4.22 - 5.81 MIL/uL   Hemoglobin 16.1 13.0 - 17.0 g/dL   HCT 46.8 39.0 - 52.0 %   MCV 103.8 (H) 78.0 - 100.0 fL   MCH 35.7 (H) 26.0 - 34.0 pg   MCHC 34.4 30.0 - 36.0 g/dL   RDW 13.1 11.5 - 15.5 %   Platelets 125 (L) 150 - 400 K/uL  BNP (order ONLY if patient complains of dyspnea/SOB AND you have documented it for THIS visit)  Result Value Ref Range   B Natriuretic Peptide 59.3 0.0 - 100.0 pg/mL  Basic metabolic panel  Result Value Ref Range   Sodium 137 135 - 145 mmol/L   Potassium 4.7 3.5 - 5.1 mmol/L   Chloride 97 96 - 112 mmol/L   CO2 25 19 - 32 mmol/L   Glucose, Bld 142 (H) 70 - 99 mg/dL   BUN 11 6 - 23 mg/dL   Creatinine, Ser 0.99 0.50 - 1.35 mg/dL   Calcium 9.1 8.4 - 10.5 mg/dL   GFR calc non Af Amer 84 (L) >90 mL/min   GFR calc Af Amer >90 >90 mL/min   Anion gap 15 5 - 15  Urinalysis, Routine w reflex microscopic  Result Value Ref Range   Color, Urine AMBER (A) YELLOW    APPearance CLEAR CLEAR   Specific Gravity, Urine 1.019 1.005 - 1.030   pH 7.5 5.0 - 8.0   Glucose, UA NEGATIVE NEGATIVE mg/dL   Hgb urine dipstick NEGATIVE NEGATIVE   Bilirubin Urine NEGATIVE NEGATIVE   Ketones, ur NEGATIVE NEGATIVE mg/dL   Protein, ur NEGATIVE NEGATIVE mg/dL   Urobilinogen, UA 0.2 0.0 - 1.0 mg/dL   Nitrite NEGATIVE NEGATIVE   Leukocytes, UA NEGATIVE NEGATIVE  I-stat troponin, ED (not at [facility])  Result Value Ref Range   Troponin i, poc 0.01 0.00 - 0.08 ng/mL   Comment 3           Dg Chest 2 View 10/15/2014   CLINICAL DATA:  64 year old male with a history of shortness of breath and chest pain.  EXAM: CHEST - 2 VIEW  COMPARISON:  CT abdomen 02/08/2014, chest x-ray 10/16/2011 and 10/08/2011  FINDINGS: Cardiomediastinal silhouette unchanged in size and contour with borderline cardiomegaly.  Asymmetric elevation of the right hemidiaphragm.  Atherosclerotic changes of the aortic arch.  Interval removal of right-sided PICC.  No pneumothorax or pleural effusion.  No confluent airspace disease.  No displaced fracture.  Lateral view demonstrates anterior osteophyte production, although there is underlying syndesmophyte production, present on the current and on comparison abdominal CT and plain film. Early bridging osteophyte at the sacroiliac joint on the prior CT abdomen.  IMPRESSION: No radiographic evidence of acute cardiopulmonary disease.  Atherosclerosis.  Degenerative changes of the spine including syndesmophyte production. There is early bridging osteophyte production at the sacroiliac joint on prior CT pelvis. These changes can be seen with rheumatologic conditions such as ankylosing spondylitis. Elective referral for rheumatologic evaluation may be useful if not already completed.  Signed,  Dulcy Fanny. Earleen Newport, DO  [facility]  [facility]   Electronically Signed   By: Corrie Mckusick D.O.   On: 10/15/2014 09:56   Ct Angio Chest Pe W/cm  &/or Wo Cm 10/15/2014   CLINICAL DATA:  Shortness  of breath for several dayds; chest pain one day; 0200 this morning cou.ld not get back to sleep due to discomfort. Was getting diaphoretic and anxious and nauseated. HX of stones but does not feel like it. No h/o PE  EXAM: CT ANGIOGRAPHY CHEST  CT ABDOMEN AND PELVIS WITH CONTRAST  TECHNIQUE: Multidetector CT imaging of the chest was performed using the standard protocol during bolus administration of intravenous contrast. Multiplanar CT image reconstructions and MIPs were obtained to evaluate the vascular anatomy. Multidetector CT imaging of the abdomen and pelvis was performed using the standard protocol during bolus administration of intravenous contrast.  CONTRAST:  124mL OMNIPAQUE IOHEXOL 350 MG/ML SOLN  COMPARISON:  Current chest radiographs. Previous abdomen and pelvis CT, 02/08/2014  FINDINGS: CTA CHEST FINDINGS  Angiographic study:  No evidence of a pulmonary embolus.  Mediastinum and hila: Heart is normal in size and configuration. There are mild coronary artery calcifications. Great vessels are normal in caliber. No aortic dissection.  There is mediastinal and hilar adenopathy, most prominent along the hila. Several reference measurements were made. There is a prevascular node to the left of the left pulmonary artery measuring 18 mm in short axis. A right infrahilar node measures 18 mm in short axis. A left infrahilar node measures 16 mm in short axis.  Lungs and pleura: No lung consolidation or edema. There multiple pulmonary nodules. Largest on the right measures 7 mm in the anteromedial right upper lobe above the minor fissure. Nodules are seen bilaterally, with an upper lobe predominance mild reticular lung base opacities noted likely subsegmental atelectasis. No pleural effusion. No pneumothorax  CT ABDOMEN and PELVIS FINDINGS  Fatty infiltration of the liver.  No liver mass or focal lesion.  Spleen, gallbladder, pancreas, adrenal glands, kidneys,  ureters, bladder: Unremarkable.  No pathologically enlarged lymph nodes. No abnormal fluid collections.  Colon and small bowel are unremarkable.  Normal appendix.  MUSCULOSKELETAL  Degenerative changes noted along the spine. No osteoblastic or osteolytic lesions.  Review of the MIP images confirms the above findings.  IMPRESSION: 1. No evidence of a pulmonary embolus. 2. Mediastinal and hilar adenopathy as well as multiple small pulmonary nodules. The combination findings suggest sarcoidosis. 3. No acute findings in the lungs. 4. No acute findings below the diaphragm. 5. Diffuse hepatic steatosis. 6. No abdominal or pelvic adenopathy.   Electronically Signed   By: Lajean Manes M.D.   On: 10/15/2014 11:37   Ct Abdomen Pelvis W Contrast 10/15/2014   CLINICAL DATA:  Shortness of breath for several dayds; chest pain one day; 0200 this morning cou.ld not get back to sleep due to discomfort. Was getting diaphoretic and anxious and nauseated. HX of stones but does not feel like it. No h/o PE  EXAM: CT ANGIOGRAPHY CHEST  CT ABDOMEN AND PELVIS WITH CONTRAST  TECHNIQUE: Multidetector CT imaging of the chest was performed using the standard protocol during bolus administration of intravenous contrast. Multiplanar CT image reconstructions and MIPs were obtained to evaluate the vascular anatomy. Multidetector CT imaging of the abdomen and pelvis was performed using the standard protocol during bolus administration of intravenous contrast.  CONTRAST:  163mL OMNIPAQUE IOHEXOL 350 MG/ML SOLN  COMPARISON:  Current chest radiographs. Previous abdomen and pelvis CT, 02/08/2014  FINDINGS: CTA CHEST FINDINGS  Angiographic study:  No evidence of a pulmonary embolus.  Mediastinum and hila: Heart is normal in size and configuration. There are mild coronary artery calcifications. Great vessels are normal in caliber. No aortic dissection.  There is  mediastinal and hilar adenopathy, most prominent along the hila. Several reference  measurements were made. There is a prevascular node to the left of the left pulmonary artery measuring 18 mm in short axis. A right infrahilar node measures 18 mm in short axis. A left infrahilar node measures 16 mm in short axis.  Lungs and pleura: No lung consolidation or edema. There multiple pulmonary nodules. Largest on the right measures 7 mm in the anteromedial right upper lobe above the minor fissure. Nodules are seen bilaterally, with an upper lobe predominance mild reticular lung base opacities noted likely subsegmental atelectasis. No pleural effusion. No pneumothorax  CT ABDOMEN and PELVIS FINDINGS  Fatty infiltration of the liver.  No liver mass or focal lesion.  Spleen, gallbladder, pancreas, adrenal glands, kidneys, ureters, bladder: Unremarkable.  No pathologically enlarged lymph nodes. No abnormal fluid collections.  Colon and small bowel are unremarkable.  Normal appendix.  MUSCULOSKELETAL  Degenerative changes noted along the spine. No osteoblastic or osteolytic lesions.  Review of the MIP images confirms the above findings.  IMPRESSION: 1. No evidence of a pulmonary embolus. 2. Mediastinal and hilar adenopathy as well as multiple small pulmonary nodules. The combination findings suggest sarcoidosis. 3. No acute findings in the lungs. 4. No acute findings below the diaphragm. 5. Diffuse hepatic steatosis. 6. No abdominal or pelvic adenopathy.   Electronically Signed   By: Lajean Manes M.D.   On: 10/15/2014 11:37    1200:  Workup reassuring. Tx for msk back pain. No previous cards workup; will admit for observation CP r/o. Dx and testing d/w pt and family.  Questions answered.  Verb understanding, agreeable to admit. T/C to [facility] Lissa Merlin, case discussed, including:  HPI, pertinent PM/SHx, VS/PE, dx testing, ED course and treatment:  Agreeable to admit.   Francine Graven, DO 10/17/14 2106

## 2014-10-15 NOTE — Progress Notes (Signed)
Spoke with Dr. Recardo Evangelist. Current labs, EKG, risk factors and history reviewed. Low index of suspicion this is definitively cardiac ischemia. Discussed timing of potential Myoview study and quality of study inpatient versus outpatient. If no factors concerning that patient may need inpatient study Dr. Recardo Evangelist suggests obtaining outpatient study. I informed him that formal inpatient cardiac evaluation not indicated at this time but would appreciate the cardiology team following up on workup in process on 4/17 and if appropriate scheduling outpatient Myoview study.  Erin Hearing, ANP

## 2014-10-15 NOTE — ED Notes (Addendum)
Shortness of breath for several dayds; chest pain one day; 0200 this morning cou.ld not get back to sleep due to discomfort. Was getting diaphoretic and anxious and nauseated. HX of stones but does not feel like it.

## 2014-10-15 NOTE — ED Notes (Signed)
Patient transported to X-ray without distress.  

## 2014-10-15 NOTE — ED Notes (Signed)
Pt hooked up to transport monitor

## 2014-10-15 NOTE — ED Notes (Signed)
CT called for get pt.

## 2014-10-15 NOTE — ED Notes (Signed)
Pt to be taken to CT after xray.

## 2014-10-15 NOTE — Progress Notes (Signed)
   64 y/o ? 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 The pain came oin when he was doing the stretching +/- nature--lasted ~ a couple of minutes---more discomfort--dul natured pain "uneasy"    More "aware" of it yesterday pm Awoke 02:00 this am-"hot flash" like symptoms--kinda sweaty/clammy Went to RR 05:00 am--this am felt SOB, nauseated No radiating features, intermittent-only x 1 this am No subj tachycardia  Heart score = 4-family history, hyperlipidemia, hypertension, age, moderately suspicious findings  Used to have reflux on Zantac-hasnt had any similar symptoms to this recently  Tod [place] is not active really--usually no SOB with ambulation--can walk up a flight of stairs  - Nausea,, - vomiting, - radiation, - headache, - fall, - unilateral weakness, - rash, - PND, - orthopnea [sleeps on side] Had [place] food last night-no sick contacts and wife did not experience any issues with this + Anxious, + tremulous  Morbidly obese pleasant but nervous Caucasian male no apparent distress EOMI NCAT, S1-S2 no murmur rub or gallop, Mallampati 2, abdomen obese-poor exam, no lower extremity edema   Plan Patient seen, examined and plan formulated with Ms Lissa Merlin, NP at the patient's bedside  He has some features that may be suggestive of CAD. We will get cardiology involved. Per guidelines we would recommend pulmonary follow-up and at least low-dose CT scan 3-6 months He needs to lose weight-his hepatic steatosis is a direct correlation with his BMI over [place], MD [facility] [phone]

## 2014-10-15 NOTE — Progress Notes (Signed)
  Echocardiogram 2D Echocardiogram has been performed.  Donata Clay 10/15/2014, 3:09 PM

## 2014-10-15 NOTE — ED Notes (Signed)
Secretary instructed to call for appt.

## 2014-10-16 ENCOUNTER — Inpatient Hospital Stay (HOSPITAL_COMMUNITY): Payer: Medicare Other

## 2014-10-16 DIAGNOSIS — R079 Chest pain, unspecified: Secondary | ICD-10-CM

## 2014-10-16 LAB — TROPONIN I: TROPONIN I: 0.03 ng/mL (ref <0.031)

## 2014-10-16 MED ORDER — TECHNETIUM TC 99M SESTAMIBI GENERIC - CARDIOLITE
30.0000 | Freq: Once | INTRAVENOUS | Status: AC | PRN
  Administered 2014-10-16: 30 via INTRAVENOUS

## 2014-10-16 MED ORDER — ENOXAPARIN SODIUM 80 MG/0.8ML ~~LOC~~ SOLN
70.0000 mg | SUBCUTANEOUS | Status: DC
Start: 2014-10-17 — End: 2014-10-17
  Filled 2014-10-16: qty 0.8

## 2014-10-16 MED ORDER — ENOXAPARIN SODIUM 80 MG/0.8ML ~~LOC~~ SOLN: 70.0000 mg | SUBCUTANEOUS | Status: DC

## 2014-10-16 NOTE — Progress Notes (Signed)
[facility] PROGRESS NOTE  Timothy Mckinney WNU:[id number] DOB: 1950-10-08 DOA: 10/15/2014 PCP: [name], MD  Assessment/Plan: 1. Chest pain: - left sided intermittent not associated with activity. EKG reviewed NSR, with borderline qt interval.  - one cardiac enzyme slightly elevated.  Echocardiogram showed good LVEF and grade 1 diastolic dysfunction.  - plan for stress myoview tomorrow. Resume aspirin and NTG , b blocker.  Lipid panel ordered and penidng.    Left knee pain: Possibly arthritis, but reports not improving even with celebrex.   Elevated MCV level: Getting b12 levels .   H/o sarcoidosis: Outpatient follow up with pulmonology.    Code Status: full code.  Family Communication: none at bedside Disposition Plan: pending, possibly home tomorrow.    Consultants:  none  Procedures:  Echo  Stress myoview  Antibiotics:  none  HPI/Subjective: Some chest pain earlier today ont he left side, lasted for a few minutes and improved. Now its gone  Objective: Filed Vitals:   10/16/14 0732  BP: 148/66  Pulse: 70  Temp: 97.7 F (36.5 C)  Resp: 19    Intake/Output Summary (Last 24 hours) at 10/16/14 1141 Last data filed at 10/15/14 1540  Gross per 24 hour  Intake    240 ml  Output      0 ml  Net    240 ml   Filed Weights   10/15/14 1914 10/15/14 2349 10/16/14 0400  Weight: 137.44 kg (303 lb) 137.399 kg (302 lb 14.6 oz) 136.896 kg (301 lb 12.8 oz)    Exam:   General:  Alert afebrile comfortable  Cardiovascular: s1s2  Respiratory: clear to auscultation, no wheezing or rhonchi  Abdomen: soft non tender non distended bowel sounds heard  Musculoskeletal: no pedal edema, left knee swollen and tender, .   Data Reviewed: Basic Metabolic Panel:  Recent Labs Lab 10/15/14 0845  NA 137  K 4.7  CL 97  CO2 25  GLUCOSE 142*  BUN 11  CREATININE 0.99  CALCIUM 9.1   Liver Function Tests: No results for input(s): AST, ALT, ALKPHOS,  BILITOT, PROT, ALBUMIN in the last 168 hours. No results for input(s): LIPASE, AMYLASE in the last 168 hours. No results for input(s): AMMONIA in the last 168 hours. CBC:  Recent Labs Lab 10/15/14 0845  WBC 3.6*  HGB 16.1  HCT 46.8  MCV 103.8*  PLT 125*   Cardiac Enzymes:  Recent Labs Lab 10/15/14 1510 10/15/14 2117 10/16/14 0315  TROPONINI <0.03 0.04* 0.03   BNP (last 3 results)  Recent Labs  10/15/14 0845  BNP 59.3    ProBNP (last 3 results) No results for input(s): PROBNP in the last 8760 hours.  CBG: No results for input(s): GLUCAP in the last 168 hours.  No results found for this or any previous visit (from the past 240 hour(s)).   Studies: Dg Chest 2 View  10/15/2014   CLINICAL DATA:  64 year old male with a history of shortness of breath and chest pain.  EXAM: CHEST - 2 VIEW  COMPARISON:  CT abdomen 02/08/2014, chest x-ray 10/16/2011 and 10/08/2011  FINDINGS: Cardiomediastinal silhouette unchanged in size and contour with borderline cardiomegaly.  Asymmetric elevation of the right hemidiaphragm.  Atherosclerotic changes of the aortic arch.  Interval removal of right-sided PICC.  No pneumothorax or pleural effusion.  No confluent airspace disease.  No displaced fracture.  Lateral view demonstrates anterior osteophyte production, although there is underlying syndesmophyte production, present on the current and on comparison abdominal CT and plain film.  Early bridging osteophyte at the sacroiliac joint on the prior CT abdomen.  IMPRESSION: No radiographic evidence of acute cardiopulmonary disease.  Atherosclerosis.  Degenerative changes of the spine including syndesmophyte production. There is early bridging osteophyte production at the sacroiliac joint on prior CT pelvis. These changes can be seen with rheumatologic conditions such as ankylosing spondylitis. Elective referral for rheumatologic evaluation may be useful if not already completed.  Signed,  Dulcy Fanny. Earleen Newport,  DO  [facility]  [facility]   Electronically Signed   By: Corrie Mckusick D.O.   On: 10/15/2014 09:56   Ct Angio Chest Pe W/cm &/or Wo Cm  10/15/2014   CLINICAL DATA:  Shortness of breath for several dayds; chest pain one day; 0200 this morning cou.ld not get back to sleep due to discomfort. Was getting diaphoretic and anxious and nauseated. HX of stones but does not feel like it. No h/o PE  EXAM: CT ANGIOGRAPHY CHEST  CT ABDOMEN AND PELVIS WITH CONTRAST  TECHNIQUE: Multidetector CT imaging of the chest was performed using the standard protocol during bolus administration of intravenous contrast. Multiplanar CT image reconstructions and MIPs were obtained to evaluate the vascular anatomy. Multidetector CT imaging of the abdomen and pelvis was performed using the standard protocol during bolus administration of intravenous contrast.  CONTRAST:  164mL OMNIPAQUE IOHEXOL 350 MG/ML SOLN  COMPARISON:  Current chest radiographs. Previous abdomen and pelvis CT, 02/08/2014  FINDINGS: CTA CHEST FINDINGS  Angiographic study:  No evidence of a pulmonary embolus.  Mediastinum and hila: Heart is normal in size and configuration. There are mild coronary artery calcifications. Great vessels are normal in caliber. No aortic dissection.  There is mediastinal and hilar adenopathy, most prominent along the hila. Several reference measurements were made. There is a prevascular node to the left of the left pulmonary artery measuring 18 mm in short axis. A right infrahilar node measures 18 mm in short axis. A left infrahilar node measures 16 mm in short axis.  Lungs and pleura: No lung consolidation or edema. There multiple pulmonary nodules. Largest on the right measures 7 mm in the anteromedial right upper lobe above the minor fissure. Nodules are seen bilaterally, with an upper lobe predominance mild reticular lung base opacities noted likely subsegmental atelectasis. No pleural  effusion. No pneumothorax  CT ABDOMEN and PELVIS FINDINGS  Fatty infiltration of the liver.  No liver mass or focal lesion.  Spleen, gallbladder, pancreas, adrenal glands, kidneys, ureters, bladder: Unremarkable.  No pathologically enlarged lymph nodes. No abnormal fluid collections.  Colon and small bowel are unremarkable.  Normal appendix.  MUSCULOSKELETAL  Degenerative changes noted along the spine. No osteoblastic or osteolytic lesions.  Review of the MIP images confirms the above findings.  IMPRESSION: 1. No evidence of a pulmonary embolus. 2. Mediastinal and hilar adenopathy as well as multiple small pulmonary nodules. The combination findings suggest sarcoidosis. 3. No acute findings in the lungs. 4. No acute findings below the diaphragm. 5. Diffuse hepatic steatosis. 6. No abdominal or pelvic adenopathy.   Electronically Signed   By: Lajean Manes M.D.   On: 10/15/2014 11:37   Ct Abdomen Pelvis W Contrast  10/15/2014   CLINICAL DATA:  Shortness of breath for several dayds; chest pain one day; 0200 this morning cou.ld not get back to sleep due to discomfort. Was getting diaphoretic and anxious and nauseated. HX of stones but does not feel like it. No h/o PE  EXAM: CT ANGIOGRAPHY CHEST  CT ABDOMEN  AND PELVIS WITH CONTRAST  TECHNIQUE: Multidetector CT imaging of the chest was performed using the standard protocol during bolus administration of intravenous contrast. Multiplanar CT image reconstructions and MIPs were obtained to evaluate the vascular anatomy. Multidetector CT imaging of the abdomen and pelvis was performed using the standard protocol during bolus administration of intravenous contrast.  CONTRAST:  135mL OMNIPAQUE IOHEXOL 350 MG/ML SOLN  COMPARISON:  Current chest radiographs. Previous abdomen and pelvis CT, 02/08/2014  FINDINGS: CTA CHEST FINDINGS  Angiographic study:  No evidence of a pulmonary embolus.  Mediastinum and hila: Heart is normal in size and configuration. There are mild coronary  artery calcifications. Great vessels are normal in caliber. No aortic dissection.  There is mediastinal and hilar adenopathy, most prominent along the hila. Several reference measurements were made. There is a prevascular node to the left of the left pulmonary artery measuring 18 mm in short axis. A right infrahilar node measures 18 mm in short axis. A left infrahilar node measures 16 mm in short axis.  Lungs and pleura: No lung consolidation or edema. There multiple pulmonary nodules. Largest on the right measures 7 mm in the anteromedial right upper lobe above the minor fissure. Nodules are seen bilaterally, with an upper lobe predominance mild reticular lung base opacities noted likely subsegmental atelectasis. No pleural effusion. No pneumothorax  CT ABDOMEN and PELVIS FINDINGS  Fatty infiltration of the liver.  No liver mass or focal lesion.  Spleen, gallbladder, pancreas, adrenal glands, kidneys, ureters, bladder: Unremarkable.  No pathologically enlarged lymph nodes. No abnormal fluid collections.  Colon and small bowel are unremarkable.  Normal appendix.  MUSCULOSKELETAL  Degenerative changes noted along the spine. No osteoblastic or osteolytic lesions.  Review of the MIP images confirms the above findings.  IMPRESSION: 1. No evidence of a pulmonary embolus. 2. Mediastinal and hilar adenopathy as well as multiple small pulmonary nodules. The combination findings suggest sarcoidosis. 3. No acute findings in the lungs. 4. No acute findings below the diaphragm. 5. Diffuse hepatic steatosis. 6. No abdominal or pelvic adenopathy.   Electronically Signed   By: Lajean Manes M.D.   On: 10/15/2014 11:37    Scheduled Meds: . aspirin EC  325 mg Oral Daily  . celecoxib  200 mg Oral Daily  . cycloSPORINE  1 drop Both Eyes Daily  . enoxaparin (LOVENOX) injection  40 mg Subcutaneous Q24H  . metoprolol succinate  50 mg Oral Daily  . potassium citrate  20 mEq Oral BID BM   Continuous Infusions:   Principal  Problem:   Chest pain Active Problems:   GERD   Hyperuricemia   Severe obesity (BMI >= 40)   HTN (hypertension)   Pulmonary nodules with adenopathy (since 2001 CT Chest)   Idiopathic thrombocytopenia   Fatty liver disease, nonalcoholic    Time spent: 25 minutes    [facility] Pager [phone]. If 7PM-7AM, please contact night-coverage at [email or web address], password [facility] 10/16/2014, 11:41 AM  LOS: 1 day

## 2014-10-17 ENCOUNTER — Inpatient Hospital Stay (HOSPITAL_COMMUNITY): Payer: Medicare Other

## 2014-10-17 DIAGNOSIS — R079 Chest pain, unspecified: Secondary | ICD-10-CM

## 2014-10-17 DIAGNOSIS — M25562 Pain in left knee: Secondary | ICD-10-CM

## 2014-10-17 DIAGNOSIS — M25569 Pain in unspecified knee: Secondary | ICD-10-CM | POA: Insufficient documentation

## 2014-10-17 LAB — FOLATE RBC
Folate, Hemolysate: 381.3 ng/mL
Folate, RBC: 893 ng/mL (ref >498)
HEMATOCRIT: 42.7 % (ref 37.5–51.0)

## 2014-10-17 LAB — LIPID PANEL
CHOL/HDL RATIO: 5.1 ratio
Cholesterol: 205 mg/dL — ABNORMAL HIGH (ref 0–200)
HDL: 40 mg/dL (ref >39)
LDL Cholesterol: 135 mg/dL — ABNORMAL HIGH (ref 0–99)
TRIGLYCERIDES: 149 mg/dL (ref <150)
VLDL: 30 mg/dL (ref 0–40)

## 2014-10-17 LAB — VITAMIN B12: Vitamin B-12: 355 pg/mL (ref 211–911)

## 2014-10-17 MED ORDER — REGADENOSON 0.4 MG/5ML IV SOLN
0.4000 mg | Freq: Once | INTRAVENOUS | Status: AC
  Administered 2014-10-17: 0.4 mg via INTRAVENOUS
  Filled 2014-10-17: qty 5

## 2014-10-17 MED ORDER — TECHNETIUM TC 99M SESTAMIBI GENERIC - CARDIOLITE
30.0000 | Freq: Once | INTRAVENOUS | Status: AC | PRN
  Administered 2014-10-17: 30 via INTRAVENOUS

## 2014-10-17 MED ORDER — REGADENOSON 0.4 MG/5ML IV SOLN
INTRAVENOUS | Status: AC
  Administered 2014-10-17: 0.4 mg via INTRAVENOUS
  Filled 2014-10-17: qty 5

## 2014-10-17 NOTE — Discharge Summary (Signed)
Physician Discharge Summary  Timothy Mckinney RSW:[id number] DOB: [birth year] DOA: 10/15/2014  PCP: Timothy Asa, MD  Admit date: 10/15/2014 Discharge date: 10/17/2014  Time spent: 20 minutes  Recommendations for Outpatient Follow-up:  1. Follow up with [place] [facility] in 2 weeks.   Discharge Diagnoses:  Principal Problem:   Chest pain Active Problems:   GERD   Hyperuricemia   Severe obesity (BMI >= 40)   HTN (hypertension)   Pulmonary nodules with adenopathy (since 2001 CT Chest)   Idiopathic thrombocytopenia   Fatty liver disease, nonalcoholic   Discharge Condition: improved  Diet recommendation: low sodium diet.   Filed Weights   10/15/14 2349 10/16/14 0400 10/17/14 0559  Weight: 137.399 kg (302 lb 14.6 oz) 136.896 kg (301 lb 12.8 oz) 135.807 kg (299 lb 6.4 oz)    History of present illness:  64 year old male admitted for chest pain.   Hospital Course:  1. Chest pain: - left sided intermittent not associated with activity. EKG reviewed NSR, with borderline qt interval.  - one cardiac enzyme slightly elevated.  Echocardiogram showed good LVEF and grade 1 diastolic dysfunction.  - plan for stress myoview tomorrow. Resume aspirin and NTG , b blocker.  Lipid panel ordered and penidng.    Left knee pain: Possibly arthritis, but reports not improving even with celebrex.   Elevated MCV level: Getting b12 levels .   H/o sarcoidosis: Outpatient follow up with pulmonology.    Procedures:  Stress myoview   Consultations:  none  Discharge Exam: Filed Vitals:   10/17/14 1334  BP: 152/67  Pulse: 81  Temp: 97.1 F (36.2 C)  Resp: 19    General: alert afebrile comfortable Cardiovascular: s1s2 Respiratory: ctab  Discharge Instructions   Discharge Instructions    Diet - low sodium heart healthy    Complete by:  As directed      Discharge instructions    Complete by:  As directed   Follow up with pulmonologist for sarcoidosis in 4 to 6 weeks.           Discharge Medication List as of 10/17/2014  5:18 PM    CONTINUE these medications which have NOT CHANGED   Details  celecoxib (CELEBREX) 200 MG capsule Take 200 mg by mouth daily., Until Discontinued, Historical Med    cycloSPORINE (RESTASIS) 0.05 % ophthalmic emulsion Place 1 drop into both eyes daily., Until Discontinued, Historical Med    diphenhydrAMINE (BENADRYL) 25 MG tablet Take 25 mg by mouth. ONE AT BEDTIME IF NEEDED SLEEP, Until Discontinued, Historical Med    Flaxseed, Linseed, (FLAXSEED OIL MAX STR) 1300 MG CAPS Take 2 capsules by mouth daily., Until Discontinued, Historical Med    lactobacillus acidophilus (BACID) TABS Take 1 tablet by mouth daily., Until Discontinued, Historical Med    metoprolol succinate (TOPROL-XL) 50 MG 24 hr tablet TAKE 1 TABLET BY MOUTH DAILY, Normal    potassium citrate (UROCIT-K) 10 MEQ (1080 MG) SR tablet Take 20 mEq by mouth 2 (two) times daily between meals., Until Discontinued, Historical Med    albuterol (PROVENTIL HFA;VENTOLIN HFA) 108 (90 BASE) MCG/ACT inhaler Inhale 2 puffs into the lungs every 6 (six) hours as needed for wheezing., Starting 11/16/2012, Until Discontinued, Normal       Allergies  Allergen Reactions  . Penicillins Swelling    Diffuse swelling   Follow-up Information    Follow up with Timothy Asa, MD In 2 weeks.   Specialty:  Family Medicine   Contact information:   [place]  [address] [place] [address] [phone]       Follow up with [facility] In 1 month.   Contact information:   [place] [id number]        The results of significant diagnostics from this hospitalization (including imaging, microbiology, ancillary and laboratory) are listed below for reference.    Significant Diagnostic Studies: Dg Chest 2 View  10/15/2014   CLINICAL DATA:  64 year old male with a history of shortness of breath and chest pain.  EXAM: CHEST - 2  VIEW  COMPARISON:  CT abdomen 02/08/2014, chest x-ray 10/16/2011 and 10/08/2011  FINDINGS: Cardiomediastinal silhouette unchanged in size and contour with borderline cardiomegaly.  Asymmetric elevation of the right hemidiaphragm.  Atherosclerotic changes of the aortic arch.  Interval removal of right-sided PICC.  No pneumothorax or pleural effusion.  No confluent airspace disease.  No displaced fracture.  Lateral view demonstrates anterior osteophyte production, although there is underlying syndesmophyte production, present on the current and on comparison abdominal CT and plain film. Early bridging osteophyte at the sacroiliac joint on the prior CT abdomen.  IMPRESSION: No radiographic evidence of acute cardiopulmonary disease.  Atherosclerosis.  Degenerative changes of the spine including syndesmophyte production. There is early bridging osteophyte production at the sacroiliac joint on prior CT pelvis. These changes can be seen with rheumatologic conditions such as ankylosing spondylitis. Elective referral for rheumatologic evaluation may be useful if not already completed.  Signed,  Dulcy Fanny. Earleen Newport, DO  [facility]  [facility]   Electronically Signed   By: Corrie Mckusick D.O.   On: 10/15/2014 09:56   Ct Angio Chest Pe W/cm &/or Wo Cm  10/15/2014   CLINICAL DATA:  Shortness of breath for several dayds; chest pain one day; 0200 this morning cou.ld not get back to sleep due to discomfort. Was getting diaphoretic and anxious and nauseated. HX of stones but does not feel like it. No h/o PE  EXAM: CT ANGIOGRAPHY CHEST  CT ABDOMEN AND PELVIS WITH CONTRAST  TECHNIQUE: Multidetector CT imaging of the chest was performed using the standard protocol during bolus administration of intravenous contrast. Multiplanar CT image reconstructions and MIPs were obtained to evaluate the vascular anatomy. Multidetector CT imaging of the abdomen and pelvis was performed using the  standard protocol during bolus administration of intravenous contrast.  CONTRAST:  1101mL OMNIPAQUE IOHEXOL 350 MG/ML SOLN  COMPARISON:  Current chest radiographs. Previous abdomen and pelvis CT, 02/08/2014  FINDINGS: CTA CHEST FINDINGS  Angiographic study:  No evidence of a pulmonary embolus.  Mediastinum and hila: Heart is normal in size and configuration. There are mild coronary artery calcifications. Great vessels are normal in caliber. No aortic dissection.  There is mediastinal and hilar adenopathy, most prominent along the hila. Several reference measurements were made. There is a prevascular node to the left of the left pulmonary artery measuring 18 mm in short axis. A right infrahilar node measures 18 mm in short axis. A left infrahilar node measures 16 mm in short axis.  Lungs and pleura: No lung consolidation or edema. There multiple pulmonary nodules. Largest on the right measures 7 mm in the anteromedial right upper lobe above the minor fissure. Nodules are seen bilaterally, with an upper lobe predominance mild reticular lung base opacities noted likely subsegmental atelectasis. No pleural effusion. No pneumothorax  CT ABDOMEN and PELVIS FINDINGS  Fatty infiltration of the liver.  No liver mass or focal lesion.  Spleen, gallbladder, pancreas, adrenal glands, kidneys, ureters, bladder: Unremarkable.  No pathologically enlarged lymph nodes. No abnormal fluid collections.  Colon and small bowel are unremarkable.  Normal appendix.  MUSCULOSKELETAL  Degenerative changes noted along the spine. No osteoblastic or osteolytic lesions.  Review of the MIP images confirms the above findings.  IMPRESSION: 1. No evidence of a pulmonary embolus. 2. Mediastinal and hilar adenopathy as well as multiple small pulmonary nodules. The combination findings suggest sarcoidosis. 3. No acute findings in the lungs. 4. No acute findings below the diaphragm. 5. Diffuse hepatic steatosis. 6. No abdominal or pelvic adenopathy.    Electronically Signed   By: Lajean Manes M.D.   On: 10/15/2014 11:37   Ct Abdomen Pelvis W Contrast  10/15/2014   CLINICAL DATA:  Shortness of breath for several dayds; chest pain one day; 0200 this morning cou.ld not get back to sleep due to discomfort. Was getting diaphoretic and anxious and nauseated. HX of stones but does not feel like it. No h/o PE  EXAM: CT ANGIOGRAPHY CHEST  CT ABDOMEN AND PELVIS WITH CONTRAST  TECHNIQUE: Multidetector CT imaging of the chest was performed using the standard protocol during bolus administration of intravenous contrast. Multiplanar CT image reconstructions and MIPs were obtained to evaluate the vascular anatomy. Multidetector CT imaging of the abdomen and pelvis was performed using the standard protocol during bolus administration of intravenous contrast.  CONTRAST:  120mL OMNIPAQUE IOHEXOL 350 MG/ML SOLN  COMPARISON:  Current chest radiographs. Previous abdomen and pelvis CT, 02/08/2014  FINDINGS: CTA CHEST FINDINGS  Angiographic study:  No evidence of a pulmonary embolus.  Mediastinum and hila: Heart is normal in size and configuration. There are mild coronary artery calcifications. Great vessels are normal in caliber. No aortic dissection.  There is mediastinal and hilar adenopathy, most prominent along the hila. Several reference measurements were made. There is a prevascular node to the left of the left pulmonary artery measuring 18 mm in short axis. A right infrahilar node measures 18 mm in short axis. A left infrahilar node measures 16 mm in short axis.  Lungs and pleura: No lung consolidation or edema. There multiple pulmonary nodules. Largest on the right measures 7 mm in the anteromedial right upper lobe above the minor fissure. Nodules are seen bilaterally, with an upper lobe predominance mild reticular lung base opacities noted likely subsegmental atelectasis. No pleural effusion. No pneumothorax  CT ABDOMEN and PELVIS FINDINGS  Fatty infiltration of the liver.   No liver mass or focal lesion.  Spleen, gallbladder, pancreas, adrenal glands, kidneys, ureters, bladder: Unremarkable.  No pathologically enlarged lymph nodes. No abnormal fluid collections.  Colon and small bowel are unremarkable.  Normal appendix.  MUSCULOSKELETAL  Degenerative changes noted along the spine. No osteoblastic or osteolytic lesions.  Review of the MIP images confirms the above findings.  IMPRESSION: 1. No evidence of a pulmonary embolus. 2. Mediastinal and hilar adenopathy as well as multiple small pulmonary nodules. The combination findings suggest sarcoidosis. 3. No acute findings in the lungs. 4. No acute findings below the diaphragm. 5. Diffuse hepatic steatosis. 6. No abdominal or pelvic adenopathy.   Electronically Signed   By: Lajean Manes M.D.   On: 10/15/2014 11:37   Nm Myocar Multi W/spect W/wall Motion / Ef  10/17/2014   CLINICAL DATA:  Mr. Rzepka is a 64 yo who was admitted with chest pain. A Lexiscan Myoview was ordered for further evaluation.  EXAM: MYOCARDIAL IMAGING WITH SPECT (REST AND PHARMACOLOGIC-STRESS)  GATED LEFT  VENTRICULAR WALL MOTION STUDY  LEFT VENTRICULAR EJECTION FRACTION  TECHNIQUE: Standard myocardial SPECT imaging was performed after resting intravenous injection of 10 mCi Tc-62m sestamibi. Subsequently, intravenous infusion of Lexiscan was performed under the supervision of the Cardiology staff. At peak effect of the drug, 30 mCi Tc-46m sestamibi was injected intravenously and standard myocardial SPECT imaging was performed. Quantitative gated imaging was also performed to evaluate left ventricular wall motion, and estimate left ventricular ejection fraction.  COMPARISON:  There were no segmental ST or T wave changes to suggest ischemia.  FINDINGS: Raw data images:  There is no significant motion artifact  Stress images: There is fairly smooth and homogeneous uptake in all areas of the myocardium.  Rest images: There is smooth and homogeneous uptake in all areas  of the myocardium.  Perfusion:  There is no evidence of ischemia or infarction.  Quantitative gated SPECT images:  End diastolic volume is 277 mL  In systolic volume is 66 mL  Ejection fraction is 56%.  The cine loop images reveal normal thickening and normal contractility in all areas of the myocardium.  IMPRESSION: 1. This is interpreted as a normal Lexiscan Myoview study. There is no evidence of ischemia or myocardial infarction.  2. The left ventricle systolic motion is normal. The ejection fraction is 56%.  *2012 Appropriate Use Criteria for Coronary Revascularization Focused Update: J Am Coll Cardiol. 8242;35(3):614-431. http://content.airportbarriers.com.aspx?articleid=1201161   Electronically Signed   By: Mertie Moores   On: 10/17/2014 16:19   Dg Knee Complete 4 Views Left  10/16/2014   CLINICAL DATA:  Left knee pain.  No reported injury.  EXAM: LEFT KNEE - COMPLETE 4+ VIEW  COMPARISON:  03/02/2010.  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 The prosthetic components are in satisfactory position and alignment. No periprosthetic lucency is seen. No fracture or dislocation. No effusion.  IMPRESSION: No acute abnormality.   Electronically Signed   By: Claudie Revering M.D.   On: 10/16/2014 17:29    Microbiology: No results found for this or any previous visit (from the past 240 hour(s)).   Labs: Basic Metabolic Panel:  Recent Labs Lab 10/15/14 0845  NA 137  K 4.7  CL 97  CO2 25  GLUCOSE 142*  BUN 11  CREATININE 0.99  CALCIUM 9.1   Liver Function Tests: No results for input(s): AST, ALT, ALKPHOS, BILITOT, PROT, ALBUMIN in the last 168 hours. No results for input(s): LIPASE, AMYLASE in the last 168 hours. No results for input(s): AMMONIA in the last 168 hours. CBC:  Recent Labs Lab 10/15/14 0845 10/16/14 1736  WBC 3.6*  --   HGB 16.1  --   HCT 46.8 42.7  MCV 103.8*  --   PLT 125*  --    Cardiac Enzymes:  Recent Labs Lab 10/15/14 1510 10/15/14 2117  10/16/14 0315  TROPONINI <0.03 0.04* 0.03   BNP: BNP (last 3 results)  Recent Labs  10/15/14 0845  BNP 59.3    ProBNP (last 3 results) No results for input(s): PROBNP in the last 8760 hours.  CBG: No results for input(s): GLUCAP in the last 168 hours.     SignedHosie Poisson  [facility] 10/17/2014, 10:13 PM

## 2014-10-17 NOTE — Care Management Note (Unsigned)
    Page 1 of 1   10/17/2014     3:46:38 PM CARE MANAGEMENT NOTE 10/17/2014  Patient:  Timothy Mckinney, Timothy Mckinney   Account Number:  [id number]  Date Initiated:  10/17/2014  Documentation initiated by:  Elenor Quinones  Subjective/Objective Assessment:   Pt admitted with chest pain     Action/Plan:   CM will monitor for disposition needs   Anticipated DC Date:  10/20/2014   Anticipated DC Plan:  HOME/SELF CARE         Choice offered to / List presented to:             Status of service:  In process, will continue to follow Medicare Important Message given?  YES (If response is "NO", the following Medicare IM given date fields will be blank) Date Medicare IM given:  10/17/2014 Medicare IM given by:  Elenor Quinones Date Additional Medicare IM given:   Additional Medicare IM given by:    Discharge Disposition:    Per UR Regulation:    If discussed at Long Length of Stay Meetings, dates discussed:    Comments:

## 2014-10-17 NOTE — Progress Notes (Signed)
Lexiscan completed without complications.  Bardia Wangerin, PAC

## 2014-10-18 ENCOUNTER — Telehealth: Payer: Self-pay | Admitting: *Deleted

## 2014-10-19 NOTE — Telephone Encounter (Addendum)
Transition Care Management Follow-up Telephone Call  How have you been since you were released from the hospital? Patient feels the same as before hospitalization, feels a little sluggish.     Do you understand why you were in the hospital? YES- shortness of breath, chest pressure- states "they did ever test possible, all the results that they had showed there was nothing wrong with the heart"  Patient reports that he has spots on lungs that they evaluated for sarcoidosis, wife was concerned that when he is breathing it sounds congested     Do you understand the discharge instrcutions? YES   Items Reviewed:  Medications reviewed: YES   Allergies reviewed:  YES   Dietary changes reviewed:  YES- low cholesterol, heart healthy diet (states he has been following and cholesterol has improved)   Referrals reviewed: YES   Functional Questionnaire:   Activities of Daily Living (ADLs):   He states they are independent in the following: all   States they require assistance with the following: none    Any transportation issues/concerns?: NO   Any patient concerns? YES - he is wondering if he should be on cholesterol medication. He would also like a suggestion for a Pulmonologist (for history of lung nodules/sarcoidosis) used to see Dr. Linna Darner as pulmonologist before he was Primary Care.     Confirmed importance and date/time of follow-up visits scheduled: YES- appointment scheduled for 10/20/14 per patient preference.     Confirmed with patient if condition begins to worsen call PCP or go to the ER.  Patient was given the Call-a-Nurse line [phone]: YES

## 2014-10-19 NOTE — Telephone Encounter (Signed)
Unable to reach patient at time of TCM Call. Left message for patient to return call when available.  

## 2014-10-20 ENCOUNTER — Ambulatory Visit (INDEPENDENT_AMBULATORY_CARE_PROVIDER_SITE_OTHER): Payer: Medicare Other | Admitting: Family Medicine

## 2014-10-20 ENCOUNTER — Encounter: Payer: Self-pay | Admitting: Family Medicine

## 2014-10-20 VITALS — BP 138/86 | HR 73 | Temp 98.0°F | Resp 16 | Wt 304.1 lb

## 2014-10-20 DIAGNOSIS — R0789 Other chest pain: Secondary | ICD-10-CM | POA: Diagnosis not present

## 2014-10-20 DIAGNOSIS — R7401 Elevation of levels of liver transaminase levels: Secondary | ICD-10-CM

## 2014-10-20 DIAGNOSIS — R918 Other nonspecific abnormal finding of lung field: Secondary | ICD-10-CM

## 2014-10-20 DIAGNOSIS — R740 Nonspecific elevation of levels of transaminase and lactic acid dehydrogenase [LDH]: Secondary | ICD-10-CM | POA: Diagnosis not present

## 2014-10-20 LAB — HEPATIC FUNCTION PANEL
ALBUMIN: 4.1 g/dL (ref 3.5–5.2)
ALK PHOS: 61 U/L (ref 39–117)
ALT: 97 U/L — AB (ref 0–53)
AST: 146 U/L — ABNORMAL HIGH (ref 0–37)
Bilirubin, Direct: 0.2 mg/dL (ref 0.0–0.3)
Total Bilirubin: 0.8 mg/dL (ref 0.2–1.2)
Total Protein: 7.3 g/dL (ref 6.0–8.3)

## 2014-10-20 NOTE — Progress Notes (Signed)
   Subjective:    Patient ID: STAN CANTAVE, male    DOB: 1950/11/24, 64 y.o.   MRN: [id number]  HPI Hospital f/u- pt was admitted 4/16-18 w/ CP and SOB.  Pt had normal nuclear stress test, ECHO showed grade 1 diastolic dysfunction but otherwise normal, CT was negative for PE but was suggestive of Sarcoidosis.  Today pt reports, 'i feel fine'.  Pt had chest pressure and increased SOB w/ diaphoresis, palpitations Saturday AM which is what prompted him to go to the ER.  Pt reports that 'once I knew everything was ok, I simmered down'.  Biggest complaint today is a sore back from the hospital mattress.  Pt's concern is the question of sarcoidosis and known pulmonary nodules- does not have current pulmonary MD.  Fatty liver- pt due for abd [place], needs repeat LFTs.  Hyperuricemia- potassium citrate capsules are not dissolving and are coming out whole.  Has appt w/ urology next month.   Review of Systems For ROS see HPI     Objective:   Physical Exam  Constitutional: He is oriented to person, place, and time. He appears well-developed and well-nourished. No distress.  obese  HENT:  Head: Normocephalic and atraumatic.  Eyes: Conjunctivae and EOM are normal. Pupils are equal, round, and reactive to light.  Neck: Normal range of motion. Neck supple. No thyromegaly present.  Cardiovascular: Normal rate, regular rhythm, normal heart sounds and intact distal pulses.   Pulmonary/Chest: Effort normal and breath sounds normal. No respiratory distress. He has no wheezes. He has no rales.  Abdominal: Soft. Bowel sounds are normal. He exhibits no distension. There is no tenderness. There is no rebound and no guarding.  Musculoskeletal: He exhibits no edema.  Lymphadenopathy:    He has no cervical adenopathy.  Neurological: He is alert and oriented to person, place, and time.  Skin: Skin is warm and dry.  Psychiatric: He has a normal mood and affect. His behavior is normal. Thought content normal.  Vitals  reviewed.         Assessment & Plan:

## 2014-10-20 NOTE — Assessment & Plan Note (Signed)
Chronic problem.  This was again brought up during pt's hospitalization.  Unclear if pt has sarcoid or not.  Will refer to pulmonary for complete evaluation and tx.

## 2014-10-20 NOTE — Progress Notes (Signed)
Pre visit review using our clinic review tool, if applicable. No additional management support is needed unless otherwise documented below in the visit note. 

## 2014-10-20 NOTE — Assessment & Plan Note (Signed)
New dx for pt.  Had complete cardiac work up that was unrevealing.  Pain has resolved and pt is asymptomatic.  Will continue to follow.

## 2014-10-20 NOTE — Assessment & Plan Note (Signed)
Noted on last labs.  Hepatitis panel was negative.  Will repeat today and if still elevated will need abd [place].  Pt expressed understanding and is in agreement w/ plan.

## 2014-10-20 NOTE — Patient Instructions (Signed)
We'll notify you of your lab results and determine if we need to do an ultrasound of the liver We'll call you with your pulmonary appt There are potassium citrate granules/powder available- ask Urology about changing this Call with any questions or concerns Hang in there!!!

## 2014-10-21 ENCOUNTER — Other Ambulatory Visit: Payer: Self-pay | Admitting: Family Medicine

## 2014-10-21 DIAGNOSIS — R945 Abnormal results of liver function studies: Principal | ICD-10-CM

## 2014-10-21 DIAGNOSIS — R7989 Other specified abnormal findings of blood chemistry: Secondary | ICD-10-CM

## 2014-11-09 ENCOUNTER — Encounter: Payer: Self-pay | Admitting: Pulmonary Disease

## 2014-11-09 ENCOUNTER — Ambulatory Visit (INDEPENDENT_AMBULATORY_CARE_PROVIDER_SITE_OTHER): Payer: Medicare Other | Admitting: Pulmonary Disease

## 2014-11-09 ENCOUNTER — Other Ambulatory Visit: Payer: Medicare Other

## 2014-11-09 DIAGNOSIS — R918 Other nonspecific abnormal finding of lung field: Secondary | ICD-10-CM | POA: Diagnosis not present

## 2014-11-09 DIAGNOSIS — D869 Sarcoidosis, unspecified: Secondary | ICD-10-CM | POA: Diagnosis not present

## 2014-11-09 DIAGNOSIS — R590 Localized enlarged lymph nodes: Secondary | ICD-10-CM | POA: Insufficient documentation

## 2014-11-09 DIAGNOSIS — R0600 Dyspnea, unspecified: Secondary | ICD-10-CM | POA: Insufficient documentation

## 2014-11-09 DIAGNOSIS — R599 Enlarged lymph nodes, unspecified: Secondary | ICD-10-CM

## 2014-11-09 DIAGNOSIS — R7982 Elevated C-reactive protein (CRP): Secondary | ICD-10-CM

## 2014-11-09 NOTE — Patient Instructions (Signed)
Jasher- it was nice meeting you today...  We discussed your history & we have requested your old paper chart for me to review...  Today we did a Spirometry test and an ambulatory oxygen test... We also did some blood work to check your ACE level (sarcoid blood test)...    We will contact you w/ the results when available...   Call for any questions...  Let's plan a follow up visit in 32mo, sooner if needed for problems.Marland KitchenMarland Kitchen

## 2014-11-09 NOTE — Progress Notes (Signed)
Subjective:     Patient ID: Timothy Mckinney, male   DOB: 1950/12/20, 64 y.o.   MRN: [id number]  HPI 64 y/o WM referred by DrTabori for mult pulmonary nodules seen on recent CT Chest... Timothy Mckinney presents w/ his wife, [name];  He is a never smoker and denies any hx lung dis other than occas bronchitis;  He was employed as a Freight forwarder in Network engineer, [place], [employer] denies any signif exposures, job involved lots of international travel, prev worked Architect in high school yrs;  He notes long hx of spots on his lungs (at least 20 yrs)- his prev PCP was at [facility] he was sent to [facility] for eval, he was asymtomatic, they wondered about Sarcoid, says he declined Bx, and that they've gotten smaller over time;  Later on he saw DrGonzalez for Pulmonary & they watched it for several yrs, ?etiology- felt to be benign (we have requested the old paper chart to review- pending)...      He was recently [facility] 4/16 - 10/17/14 w/ chest pain & SOB> EKG & Enz were neg, CXR showed NAD, CT Chest/ Abd/ Pelvis showed no evid for PE, stable changes w/ mult sm pulm nodules w/ mediastinal & hilar adenopathy, diffuse hepatic steatosis, no acute changes;  He was asked to f/u w/ pulmonary- says he noted SOB, chest pressure, but "my heart is fine"; notes SOB x1+yr, retired several yrs ago & less active esp in regard to left knee replacement & complications (infection); his weight is now at his all time max= 303#, 5\' 11"  Tall, BMI= 42;  He is clearly deconditioned...       Current Meds>  AlbutHFA prn, MetopER50, Urocit-K10-2Bid, Celebrex200, Restasis   CXR 10/15/14 showed borderline heart size, atherosclerotic changes in Ao, clear lungs, elev right hemidiaph, DJD w/ osteophytes...  CT Angio Chest 10/15/14 showed neg for PE, norm heart size, mild coronary calcif, no Ao dissection, +mediastinal & hilar adenopathy (several 16-59mm nodes mentioned), mult bilat pulm nodules-  largest is 63mm on right, mild reticular lung base opacities (likely atx).. CT Abd/ Pelvis showed hepatic steatosis, no adenopathy, degen changes along the spine, otherw neg/ wnl...  EKG 10/17/14 showed NSR, rate74, wnl/ NAD...  2DEcho 10/15/14 showed mild LVH, norm LVF w/ EF=60-65%, no regional wall motion abn, Gr1DD, mild LAdil (48mm), unable to assess PAsys  Stress Myoview 10/17/14 showed no areas of ischemia or infarction, EF=56%, norm wall motion, neg Myoview...  LABS in Epic> incr LFTs c/w hepatic steatosis; FLP w/ LDL=135; CBC- ok x WBC=3.6; Borderline BS but A1c=5.5.Marland KitchenMarland Kitchen  Spirometry 11/09/14 showed FVC=4.00 (84%), FEV1=2.69 (73%), %1sec=67, mid-flows are reduced at 47% predicted; c/w mild airflow obstruction...  Ambulatory oxygen test 11/09/14> O2sat= 96% on RA at rest w/ pulse=78/min; he walked 3 laps w/ lowest O2sat=91% w/ pulse=101/min...   LAB 11/09/14:  ACE= 61 (8-52)  IMP/PLAN:  He appears to have long standing & inactive Sarcoidosis characterized by bilateral hilar & mediastinal adenopathy, and small bilat pulm nodules that have not changed over the yrs; I could not find an ACE level in Epic but we have called for his old paper chart to review his remote history & gain historical data from [place]... No therapy indicated at present, recommended to increase his activity level/ exercise/ and get his weight down... we will follow.    Past Medical History  Diagnosis Date  . Dry eyes, bilateral     Dr Bing Plume  .  Nephrolithiasis     Dr Serita Butcher  . Hyperuricemia   . Abnormal chest x-ray     ? granulomata  . Hypertension     NEW DIAGNOSIS AND STARTED ON METOPROLOL ON 10/04/11  . Elevated liver enzymes     HISTORY OF ELEVATED LIVER ENZYMES  . Thrombocytopenia     HX OF LOW PLATELET COUNT  . Sleep apnea     STOP BANG SCORE OF 4  . GERD (gastroesophageal reflux disease)     hx of   . Arthritis     knees and back   . DDD (degenerative disc disease), lumbar     MRI  08/2005  . 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 Cardiac catheterization      in 34s, negative  . Tear duct plugs    . Knee arthroscopy  2009     L  . Colonoscopy w/ polypectomy  2011    Dr Sharlett Iles  . Colon ulcer  2011  . Total knee arthroplasty  2011    Workman's Comp case, Dr Veverly Fells  . Lithotripsy    . Knee surgery      april 2013 total knee replacement removed  . Anal fistulectomy  06/01/2012    Procedure: FISTULECTOMY ANAL;  Surgeon: Leighton Ruff, MD;  Location: [facility];  Service: General;  Laterality: N/A;  Mucosal Advancement Flap     Outpatient Encounter Prescriptions as of 11/09/2014  Medication Sig  . celecoxib (CELEBREX) 200 MG capsule Take 200 mg by mouth daily.  . cycloSPORINE (RESTASIS) 0.05 % ophthalmic emulsion Place 1 drop into both eyes daily.  . diphenhydrAMINE (BENADRYL) 25 MG tablet Take 25 mg by mouth. ONE AT BEDTIME IF NEEDED SLEEP  . Flaxseed, Linseed, (FLAXSEED OIL MAX STR) 1300 MG CAPS Take 2 capsules by mouth daily.  Marland Kitchen lactobacillus acidophilus (BACID) TABS Take 1 tablet by mouth daily.  . metoprolol succinate (TOPROL-XL) 50 MG 24 hr tablet TAKE 1 TABLET BY MOUTH DAILY  . potassium citrate (UROCIT-K) 10 MEQ (1080 MG) SR tablet Take 20 mEq by mouth 2 (two) times daily between meals.   No facility-administered encounter medications on file as of 11/09/2014.    Allergies  Allergen Reactions  . Penicillins Swelling    Diffuse swelling    Family History  Problem Relation Age of Onset  . Heart attack Father 33  . Cancer Paternal Grandfather     throat , pipe smoker  . Colon polyps Mother   . Dementia Mother     died post SDH  . Stroke Neg Hx     History   Social History  . Marital Status: Married    Spouse Name: N/A  . Number of Children: N/A  . Years of Education: N/A   Occupational History  . Not on file.   Social History Main Topics  . Smoking status: Never Smoker   .  Smokeless tobacco: Never Used  . Alcohol Use: 8.4 oz/week    14 Standard drinks or equivalent per week     Comment:  since 1/15  . Drug Use: No  . Sexual Activity: Not on file   Other Topics Concern  . Not on file   Social History Narrative    Current Medications, Allergies, Past Medical History, Past Surgical History, Family History, and Social History were reviewed in [employer] record.   Review of Systems          All  symptoms NEG except where BOLDED >>  Constitutional:  F/C/S, fatigue, anorexia, unexpected weight change. HEENT:  HA, visual changes, hearing loss, earache, nasal symptoms, sore throat, mouth sores, hoarseness. Resp:  cough, sputum, hemoptysis; SOB, tightness, wheezing. Cardio:  CP, palpit, DOE, orthopnea, edema. GI:  N/V/D/C, blood in stool; reflux, abd pain, distention, gas. GU:  dysuria, freq, urgency, hematuria, flank pain, voiding difficulty. MS:  joint pain, swelling, tenderness, decr ROM; neck pain, back pain, etc. Neuro:  HA, tremors, seizures, dizziness, syncope, weakness, numbness, gait abn. Skin:  suspicious lesions or skin rash. Heme:  adenopathy, bruising, bleeding. Psyche:  confusion, agitation, sleep disturbance, hallucinations, anxiety, depression suicidal.   Objective:   Physical Exam    Vital Signs:  Reviewed...  General:  WD, WN, 64 y/o WM in NAD; alert & oriented; pleasant & cooperative... HEENT:  [place]/AT; Conjunctiva- pink, Sclera- nonicteric, EOM-wnl, PERRLA, EACs-clear, TMs-wnl; NOSE-clear; THROAT-clear & wnl. Neck:  Supple w/ fair ROM; no JVD; normal carotid impulses w/o bruits; no thyromegaly or nodules palpated; no lymphadenopathy. Chest:  Clear to P & A; without wheezes, rales, or rhonchi heard. Heart:  Regular Rhythm; without murmurs, rubs, or gallops detected. Abdomen:  Soft & nontender- no guarding or rebound; normal bowel sounds; no organomegaly or masses palpated. Ext:  decrROM; L-TKR +arthritic changes;  no varicose veins, +venous insuffic, no edema;  Pulses intact w/o bruits. Neuro:  CNs II-XII intact; motor testing normal; sensory testing normal; gait normal & balance OK. Derm:  No lesions noted; no rash etc. Lymph:  No cervical, supraclavicular, axillary, or inguinal adenopathy palpated.   Assessment:      IMP >>  Prob Sarcoidosis characterized by BHA, mediastinal adenopathy, bilat mult small pulm nodules w/o change serially... Dyspnea- multifactorial etiology (obese, deconditioned, etc)... HBP Severe DJD- s/p L-TKR w/ complications Other medical issues as noted...  PLAN >> He appears to have long standing & inactive Sarcoidosis characterized by bilateral hilar & mediastinal adenopathy, and small bilat pulm nodules that have not changed over the yrs; I could not find an ACE level in Epic but we have called for his old paper chart to review his remote history & gain historical data from [place]... No therapy indicated at present, recommended to increase his activity level/ exercise/ and get his weight down... we will follow.     Plan:     Patient's Medications  New Prescriptions   No medications on file  Previous Medications   CELECOXIB (CELEBREX) 200 MG CAPSULE    Take 200 mg by mouth daily.   CYCLOSPORINE (RESTASIS) 0.05 % OPHTHALMIC EMULSION    Place 1 drop into both eyes daily.   DIPHENHYDRAMINE (BENADRYL) 25 MG TABLET    Take 25 mg by mouth. ONE AT BEDTIME IF NEEDED SLEEP   FLAXSEED, LINSEED, (FLAXSEED OIL MAX STR) 1300 MG CAPS    Take 2 capsules by mouth daily.   LACTOBACILLUS ACIDOPHILUS (BACID) TABS    Take 1 tablet by mouth daily.   METOPROLOL SUCCINATE (TOPROL-XL) 50 MG 24 HR TABLET    TAKE 1 TABLET BY MOUTH DAILY   POTASSIUM CITRATE (UROCIT-K) 10 MEQ (1080 MG) SR TABLET    Take 20 mEq by mouth 2 (two) times daily between meals.  Modified Medications   No medications on file  Discontinued Medications   No medications on file

## 2014-11-10 LAB — ANGIOTENSIN CONVERTING ENZYME: Angiotensin-Converting Enzyme: 61 U/L — ABNORMAL HIGH (ref 8–52)

## 2014-11-14 ENCOUNTER — Telehealth: Payer: Self-pay | Admitting: Pulmonary Disease

## 2014-11-14 NOTE — Telephone Encounter (Signed)
Pt has questions about blood test results.  Pt states that his lab for Angiotensin converting enzyme is supposed to be in 8-52 range and his result was 61. Explained to patient results below...  Notes Recorded by Noralee Space, MD on 11/14/2014 at 8:10 AM Please notify patient>  ACE level is borderline elev at 61... I an awaiting his old paper chart to review... Does NOT need treatment at this time & we will follow...   Results have been explained to patient, pt expressed understanding. Nothing further needed.

## 2014-11-16 ENCOUNTER — Telehealth: Payer: Self-pay | Admitting: Family Medicine

## 2014-11-16 NOTE — Telephone Encounter (Signed)
Pre Visit letter sent  °

## 2014-12-08 ENCOUNTER — Telehealth: Payer: Self-pay | Admitting: *Deleted

## 2014-12-08 ENCOUNTER — Encounter: Payer: Self-pay | Admitting: *Deleted

## 2014-12-08 NOTE — Telephone Encounter (Signed)
Pre-Visit Call completed with patient and chart updated.   Pre-Visit Info documented in Specialty Comments under [employer].    

## 2014-12-09 ENCOUNTER — Ambulatory Visit (INDEPENDENT_AMBULATORY_CARE_PROVIDER_SITE_OTHER): Payer: Medicare Other | Admitting: Family Medicine

## 2014-12-09 ENCOUNTER — Encounter: Payer: Self-pay | Admitting: Family Medicine

## 2014-12-09 VITALS — BP 138/60 | HR 81 | Temp 97.8°F | Ht 71.25 in | Wt 301.4 lb

## 2014-12-09 DIAGNOSIS — Z0000 Encounter for general adult medical examination without abnormal findings: Secondary | ICD-10-CM | POA: Diagnosis not present

## 2014-12-09 DIAGNOSIS — I10 Essential (primary) hypertension: Secondary | ICD-10-CM

## 2014-12-09 DIAGNOSIS — E538 Deficiency of other specified B group vitamins: Secondary | ICD-10-CM

## 2014-12-09 DIAGNOSIS — E785 Hyperlipidemia, unspecified: Secondary | ICD-10-CM | POA: Diagnosis not present

## 2014-12-09 LAB — BASIC METABOLIC PANEL
BUN: 11 mg/dL (ref 6–23)
CO2: 26 mEq/L (ref 19–32)
CREATININE: 0.76 mg/dL (ref 0.40–1.50)
Calcium: 9.1 mg/dL (ref 8.4–10.5)
Chloride: 101 mEq/L (ref 96–112)
GFR: 109.13 mL/min (ref >60.00)
Glucose, Bld: 125 mg/dL — ABNORMAL HIGH (ref 70–99)
POTASSIUM: 4.1 meq/L (ref 3.5–5.1)
Sodium: 134 mEq/L — ABNORMAL LOW (ref 135–145)

## 2014-12-09 LAB — HEPATIC FUNCTION PANEL
ALK PHOS: 57 U/L (ref 39–117)
ALT: 91 U/L — AB (ref 0–53)
AST: 125 U/L — AB (ref 0–37)
Albumin: 4.2 g/dL (ref 3.5–5.2)
BILIRUBIN TOTAL: 1.1 mg/dL (ref 0.2–1.2)
Bilirubin, Direct: 0.4 mg/dL — ABNORMAL HIGH (ref 0.0–0.3)
TOTAL PROTEIN: 7.2 g/dL (ref 6.0–8.3)

## 2014-12-09 LAB — CBC WITH DIFFERENTIAL/PLATELET
BASOS ABS: 0 10*3/uL (ref 0.0–0.1)
BASOS PCT: 0.7 % (ref 0.0–3.0)
EOS ABS: 0.2 10*3/uL (ref 0.0–0.7)
Eosinophils Relative: 6.3 % — ABNORMAL HIGH (ref 0.0–5.0)
HCT: 44 % (ref 39.0–52.0)
HEMOGLOBIN: 15.1 g/dL (ref 13.0–17.0)
Lymphocytes Relative: 24.8 % (ref 12.0–46.0)
Lymphs Abs: 1 10*3/uL (ref 0.7–4.0)
MCHC: 34.3 g/dL (ref 30.0–36.0)
MCV: 104.4 fl — ABNORMAL HIGH (ref 78.0–100.0)
MONO ABS: 0.4 10*3/uL (ref 0.1–1.0)
Monocytes Relative: 9.6 % (ref 3.0–12.0)
Neutro Abs: 2.3 10*3/uL (ref 1.4–7.7)
Neutrophils Relative %: 58.6 % (ref 43.0–77.0)
Platelets: 127 10*3/uL — ABNORMAL LOW (ref 150.0–400.0)
RBC: 4.22 Mil/uL (ref 4.22–5.81)
RDW: 13.6 % (ref 11.5–15.5)
WBC: 3.9 10*3/uL — AB (ref 4.0–10.5)

## 2014-12-09 LAB — LIPID PANEL
Cholesterol: 217 mg/dL — ABNORMAL HIGH (ref 0–200)
HDL: 48.5 mg/dL (ref >39.00)
LDL Cholesterol: 144 mg/dL — ABNORMAL HIGH (ref 0–99)
NONHDL: 168.5
Total CHOL/HDL Ratio: 4
Triglycerides: 122 mg/dL (ref 0.0–149.0)
VLDL: 24.4 mg/dL (ref 0.0–40.0)

## 2014-12-09 LAB — HEMOGLOBIN A1C: Hgb A1c MFr Bld: 5.1 % (ref 4.6–6.5)

## 2014-12-09 LAB — TSH: TSH: 1.99 u[IU]/mL (ref 0.35–4.50)

## 2014-12-09 LAB — VITAMIN B12: Vitamin B-12: 270 pg/mL (ref 211–911)

## 2014-12-09 MED ORDER — METOPROLOL SUCCINATE ER 50 MG PO TB24: ORAL_TABLET | ORAL | Status: DC

## 2014-12-09 NOTE — Patient Instructions (Signed)
Follow up in 6 months to recheck BP/Cholesterol We'll notify you of your lab results and make any changes if needed Try and make healthy food choices and get regular exercise You are up to date on colonoscopy- due in 2018 Call with any questions or concerns Hang in there!!  Let me know if you need anything!!!

## 2014-12-09 NOTE — Progress Notes (Signed)
Pre visit review using our clinic review tool, if applicable. No additional management support is needed unless otherwise documented below in the visit note. 

## 2014-12-09 NOTE — Progress Notes (Signed)
   Subjective:    Patient ID: Timothy Mckinney, male    DOB: [birth year], 64 y.o.   MRN: [id number]  HPI Here today for CPE.  Risk Factors: HTN- chronic problem, on Metoprolol w/ adequate control.  Denies CP, SOB, HAs, visual changes, edema. Hyperlipidemia- lipids were again elevated at last visit but due to elevated LFTs, statin was not started.  Did not have abd [place] due to wife's cancer recurring.  Denies abd pain, N/V, myalgias Obesity- chronic problem, BMI 41. Physical Activity: limited exercise Fall Risk: low Depression: denies current sxs but wife's cancer has returned and pt is under a lot of stress Hearing: normal to conversational tones and whispered voice at 6 ft ADL's: independent Cognitive: normal linear thought process, memory and attention intact Home Safety: safe at home, lives w/ wife. Height, Weight, BMI, Visual Acuity: see vitals, vision corrected to 20/20 w/ glasses Counseling: UTD on colonoscopy (Dr [name]- due 2018), UTD on urology Estill Dooms) Labs Ordered: See A&P Care Plan: See A&P    Review of Systems Patient reports no vision/hearing changes, anorexia, fever ,adenopathy, persistant/recurrent hoarseness, swallowing issues, chest pain, palpitations, edema, persistant/recurrent cough, hemoptysis, dyspnea (rest,exertional, paroxysmal nocturnal), gastrointestinal  bleeding (melena, rectal bleeding), abdominal pain, excessive heart burn, GU symptoms (dysuria, hematuria, voiding/incontinence issues) syncope, focal weakness, memory loss, numbness & tingling, skin/hair/nail changes, depression, anxiety, abnormal bruising/bleeding, musculoskeletal symptoms/signs.     Objective:   Physical Exam General Appearance:    Alert, cooperative, no distress, appears stated age, obese  Head:    Normocephalic, without obvious abnormality, atraumatic  Eyes:    PERRL, conjunctiva/corneas clear, EOM's intact, fundi    benign, both eyes       Ears:    Normal TM's and external ear canals, both  ears  Nose:   Nares normal, septum midline, mucosa normal, no drainage   or sinus tenderness  Throat:   Lips, mucosa, and tongue normal; teeth and gums normal  Neck:   Supple, symmetrical, trachea midline, no adenopathy;       thyroid:  No enlargement/tenderness/nodules  Back:     Symmetric, no curvature, ROM normal, no CVA tenderness  Lungs:     Clear to auscultation bilaterally, respirations unlabored  Chest wall:    No tenderness or deformity  Heart:    Regular rate and rhythm, S1 and S2 normal, no murmur, rub   or gallop  Abdomen:     Soft, non-tender, bowel sounds active all four quadrants,    no masses, no organomegaly  Genitalia:    Deferred to urology  Rectal:    Extremities:   Extremities normal, atraumatic, no cyanosis or edema  Pulses:   2+ and symmetric all extremities  Skin:   Skin color, texture, turgor normal, no rashes or lesions  Lymph nodes:   Cervical, supraclavicular, and axillary nodes normal  Neurologic:   CNII-XII intact. Normal strength, sensation and reflexes      throughout          Assessment & Plan:

## 2014-12-11 NOTE — Assessment & Plan Note (Signed)
Ongoing issue for pt.  He is attempting to work on healthy diet and regular exercise.  Check labs to risk stratify.  Will follow.

## 2014-12-11 NOTE — Assessment & Plan Note (Signed)
Chronic problem.  Adequate control.  Asymptomatic.  Check labs.  No anticipated med changes 

## 2014-12-11 NOTE — Assessment & Plan Note (Signed)
Chronic problem.  Pt is not currently on statin due to elevated LFTs.  Pt did not get abd [place] as recommended due to wife's cancer recurrence.  Check labs.  Determine next steps based on results.

## 2014-12-11 NOTE — Assessment & Plan Note (Signed)
Pt's PE WNL w/ exception of obesity.  UTD on colonoscopy, Urology.  Written screening schedule updated and given to pt.  Check labs.  Anticipatory guidance provided.

## 2014-12-11 NOTE — Assessment & Plan Note (Signed)
Repeat B12 levels and replete prn.

## 2014-12-12 ENCOUNTER — Encounter: Payer: Self-pay | Admitting: Pulmonary Disease

## 2014-12-12 ENCOUNTER — Ambulatory Visit (INDEPENDENT_AMBULATORY_CARE_PROVIDER_SITE_OTHER): Payer: Medicare Other | Admitting: Pulmonary Disease

## 2014-12-12 VITALS — BP 120/70 | HR 78 | Temp 97.1°F | Wt 301.0 lb

## 2014-12-12 DIAGNOSIS — R918 Other nonspecific abnormal finding of lung field: Secondary | ICD-10-CM

## 2014-12-12 DIAGNOSIS — D869 Sarcoidosis, unspecified: Secondary | ICD-10-CM

## 2014-12-12 DIAGNOSIS — R590 Localized enlarged lymph nodes: Secondary | ICD-10-CM

## 2014-12-12 DIAGNOSIS — R599 Enlarged lymph nodes, unspecified: Secondary | ICD-10-CM

## 2014-12-12 DIAGNOSIS — R0600 Dyspnea, unspecified: Secondary | ICD-10-CM

## 2014-12-12 NOTE — Patient Instructions (Signed)
Today we updated your med list in our EPIC system...    Continue your current medications the same...  We reviewed your prev eval> CXR, CT, PFT, etc...    We also reviewed your old paper chart re: your remote Dx of prob sarcoidosis...  Everything appears stable and we will continue to monitor over time...    There is not currently an indication for treatment...  Keep up the great work w/ diet & exercise...  Watch for any cough or shortness of breath...  Call for any questions...  Let's plan a follow up visit in 36mo, w/ CXR & ACE level at that time.Marland KitchenMarland Kitchen

## 2014-12-12 NOTE — Progress Notes (Signed)
Subjective:     Patient ID: Timothy Mckinney, male   DOB: 1950/11/07, 64 y.o.   MRN: [id number]  HPI ~  Nov 09, 2014:  Initial consult w/ SN>  40 y/o WM referred by DrTabori for mult pulmonary nodules seen on recent CT Chest... Mr. Kinslow presents w/ his wife, [name];  He is a never smoker and denies any hx lung dis other than occas bronchitis;  He was employed as a Freight forwarder in [employer], [place], [employer]) but denies any signif exposures, job involved lots of international travel, prev worked Architect in high school yrs;  He notes long hx of spots on his lungs (at least 20 yrs)- his prev PCP was at [facility] he was sent to [facility] for eval, he was asymtomatic, they wondered about Sarcoid, says he declined Bx, and that they've gotten smaller over time;  Later on he saw DrGonzalez for Pulmonary & they watched it for several yrs, ?etiology- felt to be benign (we have requested the old paper chart to review- pending)...      He was recently [facility] by [facility] 4/16 - 10/17/14 w/ chest pain & SOB> EKG & Enz were neg, CXR showed NAD, CT Chest/ Abd/ Pelvis showed no evid for PE, stable changes w/ mult sm pulm nodules w/ mediastinal & hilar adenopathy, diffuse hepatic steatosis, no acute changes;  He was asked to f/u w/ pulmonary- says he noted SOB, chest pressure, but "my heart is fine"; notes SOB x1+yr, retired several yrs ago & less active esp in regard to left knee replacement & complications (infection); his weight is now at his all time max= 303#, 5\' 11"  Tall, BMI= 42;  He is clearly deconditioned...       Current Meds>  AlbutHFA prn, MetopER50, [id number], Celebrex200, Restasis  CXR 10/15/14 showed borderline heart size, atherosclerotic changes in Ao, clear lungs, elev right hemidiaph, DJD w/ osteophytes...  CT Angio Chest 10/15/14 showed neg for PE, norm heart size, mild coronary calcif, no Ao dissection, +mediastinal & hilar adenopathy (several 16-64mm nodes  mentioned), mult bilat pulm nodules- largest is 49mm on right, mild reticular lung base opacities (likely atx).. CT Abd/ Pelvis showed hepatic steatosis, no adenopathy, degen changes along the spine, otherw neg/ wnl...  EKG 10/17/14 showed NSR, rate74, wnl/ NAD...  2DEcho 10/15/14 showed mild LVH, norm LVF w/ EF=60-65%, no regional wall motion abn, Gr1DD, mild LAdil (39mm), unable to assess PAsys  Stress Myoview 10/17/14 showed no areas of ischemia or infarction, EF=56%, norm wall motion, neg Myoview...  LABS in Epic> incr LFTs c/w hepatic steatosis; FLP w/ LDL=135; CBC- ok x WBC=3.6; Borderline BS but A1c=5.5.Marland KitchenMarland Kitchen  Spirometry 11/09/14 showed FVC=4.00 (84%), FEV1=2.69 (73%), %1sec=67, mid-flows are reduced at 47% predicted; c/w mild airflow obstruction...  Ambulatory oxygen test 11/09/14> O2sat= 96% on RA at rest w/ pulse=78/min; he walked 3 laps w/ lowest O2sat=91% w/ pulse=101/min...   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 No therapy indicated at present, recommended to increase his activity level/ exercise/ and get his weight down... we will follow.  ADDENDUM>>  Old paper chart reviewed>  He saw DrHopper  in 1995 w/ asymptomatic hilar adenopathy & pulm nodules; ACE level was 52; pt was reluctant to undergo bx and there was no indication for treatment, therefore they rec continued monitoring... He saw DrGonzalez in 2001- old gran dis & hilar adenopathy ?etiology, no change in CXRs serially, ACE level was 34, and she concurred w/ watchful waiting...   PFT 12/18/93 showed FVC=5.62 (109%), FEV1=3.76 (91%), %1sec=67, mid-flows reduced at 53% predicted; Lung vols were  wnl; MVV was wnl; DLCO was 97% predicted...   ~  December 12, 2014:  19mo ROV & Hasan reports stable- he has made an effort to incr activity (yard, golf) but still not exercising, no diet & wt down 3# to 301# today... We reviewed the following medical problems during today's office visit >>     Prob Sarcoidosis characterized by BHA, mediastinal adenopathy, bilat mult small pulm nodules w/o change serially> see above CXR, CTChest, PFTs, and ACE=61; likely underlying sarcoid, remains inactive & not felt to be substantially contributing to his dyspnea etc...    Dyspnea- multifactorial etiology (obese, deconditioned, mild airflow obstruction)> he is a never smoker, to improve his dyspnea- he is rec to get on diet, increase exercise (join the Y, silver sneakers), get wt down...    Morbid Obesity> weight is down 3# to 301# and BMI=42...    Severe DJD- s/p L-TKR w/ complications    Other medical issues as noted... EXAM showed Afeb, VSS, O2sat=97% on RA;  HEENT- neg, mallampati1;  Chest- clear w/o w/r/r;  Heart- RR gr1/6SEM w/o r/g;  Abd- obese, soft, neg;  Ext- LTKR, no c/c/e...   Labs 6/16:  FLP- not at goals w/ LDL=144;  Chems- ok x BS=125, A1c=5.1, GOT=125, GPT=91;  CBC- wnl x MCV=104 w/ B12=270;  TSH=1.99 IMP/PLAN>>  Pulm/ resp continues stable & his CC of SOB is clearly multifactorial w/ major components from obesity, sedentary lifestyle, deconditioning, etc;  We again discussed the imperative for DIET, EXERCISE, Wt REDUCTION; we will continue to monitor CXR/ scans, ACE, etc=> rec f/u in 4mo...    Past Medical History  Diagnosis Date  . Dry eyes, bilateral     Dr Bing Plume  . Nephrolithiasis     Dr Serita Butcher  . Hyperuricemia   . Abnormal chest x-ray     ? granulomata  . Hypertension     NEW DIAGNOSIS AND STARTED ON METOPROLOL ON 10/04/11  . Elevated liver enzymes     HISTORY OF ELEVATED LIVER ENZYMES  . Thrombocytopenia     HX OF LOW PLATELET COUNT  . Sleep apnea     STOP BANG SCORE OF 4  . GERD  (gastroesophageal reflux disease)     hx of   . Arthritis     knees and back   . DDD (degenerative disc disease), lumbar     MRI 08/2005  . 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 Cardiac catheterization      in 29s, negative  . Tear duct plugs    . Knee arthroscopy  2009     L  . Colonoscopy w/ polypectomy  2011    Dr Sharlett Iles  . Colon ulcer  2011  . Total knee arthroplasty  2011    Workman's Comp case, Dr Veverly Fells  . Lithotripsy    . Knee surgery      april 2013 total knee replacement removed  . Anal fistulectomy  06/01/2012    Procedure: FISTULECTOMY ANAL;  Surgeon: Leighton Ruff, MD;  Location: [facility];  Service: General;  Laterality: N/A;  Mucosal Advancement Flap     Outpatient Encounter Prescriptions as of 12/12/2014  Medication Sig  . cycloSPORINE (RESTASIS) 0.05 % ophthalmic emulsion Place 1 drop into both eyes daily.  . diphenhydrAMINE (BENADRYL) 25 MG tablet Take 25 mg by mouth. ONE AT BEDTIME IF NEEDED SLEEP  . Flaxseed, Linseed, (FLAXSEED OIL MAX STR) 1300 MG CAPS Take 2 capsules by mouth daily.  Marland Kitchen lactobacillus acidophilus (BACID) TABS Take 1 tablet by mouth daily.  . meloxicam (MOBIC) 15 MG tablet Take 15 mg by mouth daily.  . metoprolol succinate (TOPROL-XL) 50 MG 24 hr tablet TAKE 1 TABLET BY MOUTH DAILY  . [DISCONTINUED] potassium citrate (UROCIT-K) 10 MEQ (1080 MG) SR tablet Take 20 mEq by mouth 2 (two) times daily between meals.   No facility-administered encounter medications on file as of 12/12/2014.    Allergies  Allergen Reactions  . Penicillins Swelling    Diffuse swelling    Current Medications, Allergies, Past Medical History, Past Surgical History, Family History, and Social History were reviewed in [employer] record.   Review of Systems          All symptoms NEG except where BOLDED >>  Constitutional:  F/C/S, fatigue, anorexia, unexpected weight change. HEENT:  HA,  visual changes, hearing loss, earache, nasal symptoms, sore throat, mouth sores, hoarseness. Resp:  cough, sputum, hemoptysis; SOB, tightness, wheezing. Cardio:  CP, palpit, DOE, orthopnea, edema. GI:  N/V/D/C, blood in stool; reflux, abd pain, distention, gas. GU:  dysuria, freq, urgency, hematuria, flank pain, voiding difficulty. MS:  joint pain, swelling, tenderness, decr ROM; neck pain, back pain, etc. Neuro:  HA, tremors, seizures, dizziness, syncope, weakness, numbness, gait abn. Skin:  suspicious lesions or skin rash. Heme:  adenopathy, bruising, bleeding. Psyche:  confusion, agitation, sleep disturbance, hallucinations, anxiety, depression suicidal.   Objective:   Physical Exam    Vital Signs:  Reviewed...  General:  WD, WN, 64 y/o WM in NAD; alert & oriented; pleasant & cooperative... HEENT:  [place]/AT; Conjunctiva- pink, Sclera- nonicteric, EOM-wnl, PERRLA, EACs-clear, TMs-wnl; NOSE-clear; THROAT-clear & wnl. Neck:  Supple w/ fair ROM; no JVD; normal carotid impulses w/o bruits; no thyromegaly or nodules palpated; no lymphadenopathy. Chest:  Clear to P & A; without wheezes, rales, or rhonchi heard. Heart:  Regular Rhythm; without murmurs, rubs, or gallops detected. Abdomen:  Soft & nontender- no guarding or rebound; normal bowel sounds; no organomegaly or masses palpated. Ext:  decrROM; L-TKR +arthritic changes; no varicose veins, +venous insuffic, no edema;  Pulses intact w/o bruits. Neuro:  CNs II-XII intact; motor testing normal; sensory testing normal; gait normal & balance OK. Derm:  No lesions noted; no rash etc. Lymph:  No cervical, supraclavicular, axillary, or inguinal adenopathy palpated.   Assessment:      IMP >>  Prob Sarcoidosis characterized by BHA, mediastinal adenopathy, bilat mult small pulm nodules w/o change serially... Dyspnea- multifactorial etiology (obese, deconditioned, etc)... Morbid Obesity Severe DJD- s/p L-TKR w/ complications Other medical issues  as noted...  PLAN >> Pulm/ resp continues stable & his CC of SOB is clearly multifactorial w/ major components from obesity, sedentary lifestyle, deconditioning, etc;  We again discussed the imperative for DIET, EXERCISE, Wt REDUCTION; we will continue to monitor CXR/ scans, ACE, etc=> rec f/u in 71mo.     Plan:     Patient's Medications  New Prescriptions   No medications on file  Previous Medications   CYCLOSPORINE (RESTASIS) 0.05 %  OPHTHALMIC EMULSION    Place 1 drop into both eyes daily.   DIPHENHYDRAMINE (BENADRYL) 25 MG TABLET    Take 25 mg by mouth. ONE AT BEDTIME IF NEEDED SLEEP   FLAXSEED, LINSEED, (FLAXSEED OIL MAX STR) 1300 MG CAPS    Take 2 capsules by mouth daily.   LACTOBACILLUS ACIDOPHILUS (BACID) TABS    Take 1 tablet by mouth daily.   MELOXICAM (MOBIC) 15 MG TABLET    Take 15 mg by mouth daily.   METOPROLOL SUCCINATE (TOPROL-XL) 50 MG 24 HR TABLET    TAKE 1 TABLET BY MOUTH DAILY  Modified Medications   No medications on file  Discontinued Medications   POTASSIUM CITRATE (UROCIT-K) 10 MEQ (1080 MG) SR TABLET    Take 20 mEq by mouth 2 (two) times daily between meals.

## 2015-06-13 ENCOUNTER — Ambulatory Visit: Payer: Medicare Other | Admitting: Pulmonary Disease

## 2015-06-13 ENCOUNTER — Ambulatory Visit (INDEPENDENT_AMBULATORY_CARE_PROVIDER_SITE_OTHER): Payer: Medicare Other | Admitting: Family Medicine

## 2015-06-13 ENCOUNTER — Encounter: Payer: Self-pay | Admitting: Family Medicine

## 2015-06-13 VITALS — BP 122/72 | HR 71 | Temp 98.0°F | Resp 16 | Ht 71.0 in | Wt 302.4 lb

## 2015-06-13 DIAGNOSIS — E785 Hyperlipidemia, unspecified: Secondary | ICD-10-CM

## 2015-06-13 DIAGNOSIS — F411 Generalized anxiety disorder: Secondary | ICD-10-CM | POA: Diagnosis not present

## 2015-06-13 DIAGNOSIS — I10 Essential (primary) hypertension: Secondary | ICD-10-CM | POA: Diagnosis not present

## 2015-06-13 LAB — LIPID PANEL
Cholesterol: 201 mg/dL — ABNORMAL HIGH (ref 0–200)
HDL: 52.5 mg/dL (ref >39.00)
LDL Cholesterol: 117 mg/dL — ABNORMAL HIGH (ref 0–99)
NONHDL: 148.87
Total CHOL/HDL Ratio: 4
Triglycerides: 160 mg/dL — ABNORMAL HIGH (ref 0.0–149.0)
VLDL: 32 mg/dL (ref 0.0–40.0)

## 2015-06-13 LAB — HEPATIC FUNCTION PANEL
ALT: 62 U/L — AB (ref 0–53)
AST: 87 U/L — ABNORMAL HIGH (ref 0–37)
Albumin: 4.1 g/dL (ref 3.5–5.2)
Alkaline Phosphatase: 54 U/L (ref 39–117)
BILIRUBIN TOTAL: 1.1 mg/dL (ref 0.2–1.2)
Bilirubin, Direct: 0.4 mg/dL — ABNORMAL HIGH (ref 0.0–0.3)
Total Protein: 6.9 g/dL (ref 6.0–8.3)

## 2015-06-13 LAB — CBC WITH DIFFERENTIAL/PLATELET
BASOS PCT: 0.5 % (ref 0.0–3.0)
Basophils Absolute: 0 10*3/uL (ref 0.0–0.1)
EOS PCT: 7.3 % — AB (ref 0.0–5.0)
Eosinophils Absolute: 0.3 10*3/uL (ref 0.0–0.7)
HCT: 44.7 % (ref 39.0–52.0)
Hemoglobin: 15 g/dL (ref 13.0–17.0)
LYMPHS PCT: 26.2 % (ref 12.0–46.0)
Lymphs Abs: 1 10*3/uL (ref 0.7–4.0)
MCHC: 33.5 g/dL (ref 30.0–36.0)
MCV: 103.6 fl — ABNORMAL HIGH (ref 78.0–100.0)
Monocytes Absolute: 0.3 10*3/uL (ref 0.1–1.0)
Monocytes Relative: 8.9 % (ref 3.0–12.0)
Neutro Abs: 2.2 10*3/uL (ref 1.4–7.7)
Neutrophils Relative %: 57.1 % (ref 43.0–77.0)
Platelets: 127 10*3/uL — ABNORMAL LOW (ref 150.0–400.0)
RBC: 4.32 Mil/uL (ref 4.22–5.81)
RDW: 14.2 % (ref 11.5–15.5)
WBC: 3.9 10*3/uL — ABNORMAL LOW (ref 4.0–10.5)

## 2015-06-13 LAB — BASIC METABOLIC PANEL
BUN: 18 mg/dL (ref 6–23)
CHLORIDE: 98 meq/L (ref 96–112)
CO2: 29 mEq/L (ref 19–32)
CREATININE: 0.93 mg/dL (ref 0.40–1.50)
Calcium: 9.2 mg/dL (ref 8.4–10.5)
GFR: 86.32 mL/min (ref >60.00)
Glucose, Bld: 135 mg/dL — ABNORMAL HIGH (ref 70–99)
Potassium: 4.5 mEq/L (ref 3.5–5.1)
Sodium: 134 mEq/L — ABNORMAL LOW (ref 135–145)

## 2015-06-13 MED ORDER — SERTRALINE HCL 50 MG PO TABS: 50.0000 mg | ORAL_TABLET | Freq: Every day | ORAL | Status: DC

## 2015-06-13 NOTE — Patient Instructions (Signed)
Follow up in 4-6 weeks to recheck mood We'll notify you of your lab results and make any changes if needed Start the Zoloft daily- start w/ 1/2 tab x6 days and increase to 1 tab daily Try and find a stress outlet- music, art, exercise, etc Continue to work on healthy diet and regular exercise- you can do it! Call with any questions or concerns If you want to join [place] at the new [place] office, any scheduled appointments will automatically transfer and we will see you at [address] [place] [address] [place], [place] [address] ([place] 07/04/15) Sims in there!! Happy Holidays!!!

## 2015-06-13 NOTE — Assessment & Plan Note (Signed)
New.  Pt's wife asked him to discuss this at today's visit as he is getting more overwhelmed and having more GI distress in response to his anxiety.  Admits that he is now anxious about things that never used to bother him.  Based on this, will start low dose Zoloft and monitor for improvement.  Pt expressed understanding and is in agreement w/ plan.

## 2015-06-13 NOTE — Progress Notes (Signed)
   Subjective:    Patient ID: Timothy Mckinney, male    DOB: 07/05/1950, 64 y.o.   MRN: [id number]  HPI HTN- chronic problem.  Currently on metoprolol w/ good control.  No CP, SOB, HAs, visual changes, edema.  Hyperlipidemia- chronic problem.  Not currently on a statin due to hx of elevated liver enzymes.  No abd pain, N/V.  Obesity- pt's BMI is over 40.  Not exercising regularly.   Anxiety- 'i'm stressed'.  Wife is asking him to 'get happy pills'.  Pt reports he will get nervous and 'then I have to go the bathroom'.  Wife currently has cancer- just finished radiation.  Pt reports the anxiety is starting to be overwhelming   Review of Systems For ROS see HPI     Objective:   Physical Exam  Constitutional: He is oriented to person, place, and time. He appears well-developed and well-nourished. No distress.  obese  HENT:  Head: Normocephalic and atraumatic.  Eyes: Conjunctivae and EOM are normal. Pupils are equal, round, and reactive to light.  Neck: Normal range of motion. Neck supple. No thyromegaly present.  Cardiovascular: Normal rate, regular rhythm, normal heart sounds and intact distal pulses.   No murmur heard. Pulmonary/Chest: Effort normal and breath sounds normal. No respiratory distress.  Abdominal: Soft. Bowel sounds are normal. He exhibits no distension.  Musculoskeletal: He exhibits no edema.  Lymphadenopathy:    He has no cervical adenopathy.  Neurological: He is alert and oriented to person, place, and time. No cranial nerve deficit.  Skin: Skin is warm and dry.  Psychiatric: He has a normal mood and affect. His behavior is normal.  Vitals reviewed.         Assessment & Plan:

## 2015-06-13 NOTE — Assessment & Plan Note (Signed)
Chronic problem.  Adequate control.  Asymptomatic.  Check labs.  No anticipated med changes 

## 2015-06-13 NOTE — Progress Notes (Signed)
Pre visit review using our clinic review tool, if applicable. No additional management support is needed unless otherwise documented below in the visit note. 

## 2015-06-13 NOTE — Assessment & Plan Note (Signed)
Ongoing issue for pt.  He has not lost any weight in the last 6 months.  Again stressed the need for healthy diet, regular exercise to improve his overall health and wellbeing.  Will continue to follow.

## 2015-06-13 NOTE — Assessment & Plan Note (Signed)
Ongoing issue for pt.  Not currently on statin due to elevated LFTs.  Stressed need for healthy diet and regular exercise.  Check labs and determine next steps.

## 2015-06-14 ENCOUNTER — Other Ambulatory Visit (INDEPENDENT_AMBULATORY_CARE_PROVIDER_SITE_OTHER): Payer: Medicare Other

## 2015-06-14 DIAGNOSIS — R7309 Other abnormal glucose: Secondary | ICD-10-CM

## 2015-06-14 LAB — HEMOGLOBIN A1C: HEMOGLOBIN A1C: 5.1 % (ref 4.6–6.5)

## 2015-06-28 ENCOUNTER — Encounter: Payer: Self-pay | Admitting: Family Medicine

## 2015-06-28 ENCOUNTER — Ambulatory Visit (INDEPENDENT_AMBULATORY_CARE_PROVIDER_SITE_OTHER): Payer: Medicare Other | Admitting: Family Medicine

## 2015-06-28 VITALS — BP 124/80 | HR 59 | Temp 98.2°F | Resp 16 | Ht 71.0 in | Wt 301.1 lb

## 2015-06-28 DIAGNOSIS — M5431 Sciatica, right side: Secondary | ICD-10-CM | POA: Diagnosis not present

## 2015-06-28 MED ORDER — PREDNISONE 10 MG PO TABS: ORAL_TABLET | ORAL | Status: DC

## 2015-06-28 MED ORDER — TIZANIDINE HCL 2 MG PO TABS: 2.0000 mg | ORAL_TABLET | Freq: Every day | ORAL | Status: DC

## 2015-06-28 NOTE — Patient Instructions (Signed)
Follow up as needed Start the Prednisone taper as directed- take w/ food Use the Tizanidine prior to bed Heating pad as needed for pain/inflammation If no improvement after 10-14 days, please let me know so we can refer you to ortho Call with any questions or concerns Happy New Year!!!

## 2015-06-28 NOTE — Assessment & Plan Note (Signed)
New.  Pt's sxs and PE consistent w/ R sided sciatica.  Pt has hx of lumbar DDD which could be causing radicular pain so this is also on the differential.  Start Pred taper.  Muscle relaxer prior to bed.  Heating pad prn.  If no improvement, will refer to ortho.

## 2015-06-28 NOTE — Progress Notes (Signed)
Pre visit review using our clinic review tool, if applicable. No additional management support is needed unless otherwise documented below in the visit note. 

## 2015-06-28 NOTE — Progress Notes (Signed)
   Subjective:    Patient ID: Timothy Mckinney, male    DOB: [birth year], 64 y.o.   MRN: [id number]  HPI R radicular pain- pt reports he woke 5 days ago and had pain in lower back radiating into buttock and to top of the knee.  Some relief w/ 600mg  Advil.  Unable to get comfortable in bed- unable to sleep.  Some relief w/ leftover oxycodone.  Intermittent leg weakness.  Pt is very cautious on stairs.  Pain is constant.   Review of Systems For ROS see HPI     Objective:   Physical Exam  Constitutional: He is oriented to person, place, and time. He appears well-developed and well-nourished. No distress.  Morbidly obese  HENT:  Head: Normocephalic and atraumatic.  Cardiovascular: Intact distal pulses.   Musculoskeletal:  TTP over R sciatic notch No TTP over R greater trochanteric bursa  Neurological: He is alert and oriented to person, place, and time. He has normal reflexes.  Antalgic gait + SLR on R, (-) on L  Skin: Skin is warm and dry.  Psychiatric: He has a normal mood and affect. His behavior is normal. Thought content normal.  Vitals reviewed.         Assessment & Plan:

## 2015-08-09 ENCOUNTER — Encounter: Payer: Self-pay | Admitting: Family Medicine

## 2015-08-09 ENCOUNTER — Ambulatory Visit (INDEPENDENT_AMBULATORY_CARE_PROVIDER_SITE_OTHER): Payer: PPO | Admitting: Family Medicine

## 2015-08-09 VITALS — BP 128/70 | HR 76 | Temp 98.1°F | Wt 295.4 lb

## 2015-08-09 DIAGNOSIS — F411 Generalized anxiety disorder: Secondary | ICD-10-CM | POA: Diagnosis not present

## 2015-08-09 DIAGNOSIS — Z23 Encounter for immunization: Secondary | ICD-10-CM | POA: Diagnosis not present

## 2015-08-09 DIAGNOSIS — I10 Essential (primary) hypertension: Secondary | ICD-10-CM | POA: Diagnosis not present

## 2015-08-09 MED ORDER — SERTRALINE HCL 100 MG PO TABS: 100.0000 mg | ORAL_TABLET | Freq: Every day | ORAL | Status: DC

## 2015-08-09 MED ORDER — MELOXICAM 15 MG PO TABS: 15.0000 mg | ORAL_TABLET | Freq: Every day | ORAL | Status: DC

## 2015-08-09 MED ORDER — METOPROLOL SUCCINATE ER 50 MG PO TB24
ORAL_TABLET | ORAL | Status: DC
Start: 2015-08-09 — End: 2016-04-10

## 2015-08-09 NOTE — Progress Notes (Signed)
   Subjective:    Patient ID: Timothy Mckinney, male    DOB: 1951/03/15, 65 y.o.   MRN: [id number]  HPI HTN- BP is elevated today.  Pt reports taking BP med just prior to arrival.  Pt admits to being anxious.  No CP, SOB, HAs, visual changes, edema.  Pt has lost 6 lbs since last visit.  BP decreased from 170/80 --> 128/70.  Anxiety- pt reports anxiety has improved somewhat but 'not 100% there'.  Pt denies sedation or HA since starting medication.  No N/V.     Review of Systems For ROS see HPI     Objective:   Physical Exam  Constitutional: He is oriented to person, place, and time. He appears well-developed and well-nourished. No distress.  HENT:  Head: Normocephalic and atraumatic.  Eyes: Conjunctivae and EOM are normal. Pupils are equal, round, and reactive to light.  Neck: Normal range of motion. Neck supple. No thyromegaly present.  Cardiovascular: Normal rate, regular rhythm, normal heart sounds and intact distal pulses.   No murmur heard. Pulmonary/Chest: Effort normal and breath sounds normal. No respiratory distress.  Abdominal: Soft. Bowel sounds are normal. He exhibits no distension.  Musculoskeletal: He exhibits no edema.  Lymphadenopathy:    He has no cervical adenopathy.  Neurological: He is alert and oriented to person, place, and time. No cranial nerve deficit.  Skin: Skin is warm and dry.  Psychiatric: He has a normal mood and affect. His behavior is normal.  Vitals reviewed.         Assessment & Plan:

## 2015-08-09 NOTE — Assessment & Plan Note (Signed)
Chronic problem.  BP is well controlled today after pt calmed down and BP was rechecked.  Currently asymptomatic.  No med changes at this time.  Will continue to follow.

## 2015-08-09 NOTE — Assessment & Plan Note (Signed)
Improved but pt feels there is still more room for improvement.  Increase Zoloft to 100mg  daily as he is not having any side effects.  Will continue to monitor.  Pt expressed understanding and is in agreement w/ plan.

## 2015-08-09 NOTE — Patient Instructions (Signed)
Schedule your complete physical in June Increase the Zoloft to 100mg  daily- 2 of what you have at home and 1 of the new prescription Start Immodium daily or every other day to slow the bowels Call with any questions or concerns If you want to join [place] at the new [place] office, any scheduled appointments will automatically transfer and we will see you at [address] [place] [address] [place], [place] [address] ([place]) Happy Valentine's Day!!!

## 2015-08-09 NOTE — Progress Notes (Signed)
Pre visit review using our clinic review tool, if applicable. No additional management support is needed unless otherwise documented below in the visit note. 

## 2015-09-29 ENCOUNTER — Telehealth: Payer: Self-pay | Admitting: Family Medicine

## 2015-09-29 NOTE — Telephone Encounter (Signed)
Relation to [id number] Call back number:[phone]   Reason for call:  Patient would like to know if sertraline (ZOLOFT) 100 MG tablet may cause fatigue and would like to know when was his last B12 patient considering B12 injections again.

## 2015-10-02 MED ORDER — CITALOPRAM HYDROBROMIDE 20 MG PO TABS: 20.0000 mg | ORAL_TABLET | Freq: Every day | ORAL | Status: DC

## 2015-10-02 NOTE — Telephone Encounter (Signed)
Pt notified and expressed an understanding. Med filled to [facility] as requested.

## 2015-10-02 NOTE — Telephone Encounter (Signed)
Pt advised that he would like to change medications. He would like to know on the new medication if there are any side effects to be concerned about and also how to titrate off the zoloft if needed. Pt will wait until CPE in June to check B12 levels.

## 2015-10-02 NOTE — Telephone Encounter (Signed)
It is possible for Zoloft to cause fatigue- we can change this if needed.  It would also be reasonable to check B12 level.

## 2015-10-02 NOTE — Telephone Encounter (Signed)
No need to taper off Zoloft.  We can do a 1 to 1 switch.  He can stop the Zoloft and start Celexa 20mg  daily.  This should improve his fatigue.

## 2015-10-02 NOTE — Telephone Encounter (Signed)
Please advise on the sertraline. B12 last drawn on 11/2014.

## 2015-12-15 ENCOUNTER — Encounter: Payer: Self-pay | Admitting: Family Medicine

## 2015-12-15 ENCOUNTER — Ambulatory Visit (INDEPENDENT_AMBULATORY_CARE_PROVIDER_SITE_OTHER): Payer: PPO | Admitting: Family Medicine

## 2015-12-15 ENCOUNTER — Other Ambulatory Visit (INDEPENDENT_AMBULATORY_CARE_PROVIDER_SITE_OTHER): Payer: PPO

## 2015-12-15 VITALS — BP 126/73 | HR 71 | Temp 98.1°F | Resp 16 | Ht 71.0 in | Wt 295.4 lb

## 2015-12-15 DIAGNOSIS — R7309 Other abnormal glucose: Secondary | ICD-10-CM

## 2015-12-15 DIAGNOSIS — Z0000 Encounter for general adult medical examination without abnormal findings: Secondary | ICD-10-CM

## 2015-12-15 DIAGNOSIS — F411 Generalized anxiety disorder: Secondary | ICD-10-CM

## 2015-12-15 DIAGNOSIS — I10 Essential (primary) hypertension: Secondary | ICD-10-CM | POA: Diagnosis not present

## 2015-12-15 DIAGNOSIS — R5383 Other fatigue: Secondary | ICD-10-CM

## 2015-12-15 DIAGNOSIS — D693 Immune thrombocytopenic purpura: Secondary | ICD-10-CM | POA: Diagnosis not present

## 2015-12-15 DIAGNOSIS — D869 Sarcoidosis, unspecified: Secondary | ICD-10-CM

## 2015-12-15 DIAGNOSIS — Z125 Encounter for screening for malignant neoplasm of prostate: Secondary | ICD-10-CM

## 2015-12-15 DIAGNOSIS — L989 Disorder of the skin and subcutaneous tissue, unspecified: Secondary | ICD-10-CM

## 2015-12-15 DIAGNOSIS — E538 Deficiency of other specified B group vitamins: Secondary | ICD-10-CM

## 2015-12-15 LAB — VITAMIN B12: VITAMIN B 12: 246 pg/mL (ref 211–911)

## 2015-12-15 LAB — BASIC METABOLIC PANEL
BUN: 14 mg/dL (ref 6–23)
CO2: 31 mEq/L (ref 19–32)
CREATININE: 0.76 mg/dL (ref 0.40–1.50)
Calcium: 9.3 mg/dL (ref 8.4–10.5)
Chloride: 99 mEq/L (ref 96–112)
GFR: 108.8 mL/min (ref >60.00)
Glucose, Bld: 117 mg/dL — ABNORMAL HIGH (ref 70–99)
Potassium: 4.6 mEq/L (ref 3.5–5.1)
Sodium: 137 mEq/L (ref 135–145)

## 2015-12-15 LAB — HEPATIC FUNCTION PANEL
ALBUMIN: 4.1 g/dL (ref 3.5–5.2)
ALT: 51 U/L (ref 0–53)
AST: 73 U/L — AB (ref 0–37)
Alkaline Phosphatase: 81 U/L (ref 39–117)
Bilirubin, Direct: 0.3 mg/dL (ref 0.0–0.3)
Total Bilirubin: 0.9 mg/dL (ref 0.2–1.2)
Total Protein: 6.9 g/dL (ref 6.0–8.3)

## 2015-12-15 LAB — HEMOGLOBIN A1C: HEMOGLOBIN A1C: 4.8 % (ref 4.6–6.5)

## 2015-12-15 LAB — CBC WITH DIFFERENTIAL/PLATELET
BASOS ABS: 0 10*3/uL (ref 0.0–0.1)
Basophils Relative: 0.6 % (ref 0.0–3.0)
EOS PCT: 5.4 % — AB (ref 0.0–5.0)
Eosinophils Absolute: 0.2 10*3/uL (ref 0.0–0.7)
HCT: 43.4 % (ref 39.0–52.0)
HEMOGLOBIN: 14.8 g/dL (ref 13.0–17.0)
Lymphocytes Relative: 24.5 % (ref 12.0–46.0)
Lymphs Abs: 0.9 10*3/uL (ref 0.7–4.0)
MCHC: 34 g/dL (ref 30.0–36.0)
MCV: 103.6 fl — ABNORMAL HIGH (ref 78.0–100.0)
MONO ABS: 0.3 10*3/uL (ref 0.1–1.0)
Monocytes Relative: 8.9 % (ref 3.0–12.0)
Neutro Abs: 2.3 10*3/uL (ref 1.4–7.7)
Neutrophils Relative %: 60.6 % (ref 43.0–77.0)
Platelets: 102 10*3/uL — ABNORMAL LOW (ref 150.0–400.0)
RBC: 4.19 Mil/uL — AB (ref 4.22–5.81)
RDW: 13.9 % (ref 11.5–15.5)
WBC: 3.8 10*3/uL — AB (ref 4.0–10.5)

## 2015-12-15 LAB — LIPID PANEL
CHOL/HDL RATIO: 3
Cholesterol: 189 mg/dL (ref 0–200)
HDL: 73.2 mg/dL (ref >39.00)
LDL CALC: 101 mg/dL — AB (ref 0–99)
NonHDL: 115.98
TRIGLYCERIDES: 74 mg/dL (ref 0.0–149.0)
VLDL: 14.8 mg/dL (ref 0.0–40.0)

## 2015-12-15 LAB — PSA, MEDICARE: PSA: 0.16 ng/mL (ref 0.10–4.00)

## 2015-12-15 LAB — TSH: TSH: 1.55 u[IU]/mL (ref 0.35–4.50)

## 2015-12-15 MED ORDER — FLUOXETINE HCL 20 MG PO TABS: 20.0000 mg | ORAL_TABLET | Freq: Every day | ORAL | Status: DC

## 2015-12-15 NOTE — Assessment & Plan Note (Signed)
Chronic problem.  Currently asymptomatic.  Will continue to follow along and assist as able.

## 2015-12-15 NOTE — Assessment & Plan Note (Signed)
Chronic problem.  Check CBC.  Refer to heme if counts are dropping.

## 2015-12-15 NOTE — Assessment & Plan Note (Signed)
Ongoing issue.  Anxiety is better on Celexa but he reports increased fatigue.  Will switch to Prozac and monitor for improved sxs.

## 2015-12-15 NOTE — Assessment & Plan Note (Signed)
Pt has hx of this.  Repeat labs today and replete prn.

## 2015-12-15 NOTE — Patient Instructions (Addendum)
Follow up in 6-8 weeks to recheck anxiety level We'll notify you of your lab results and make any changes if needed Continue to work on healthy diet and regular exercise- you can do it! STOP the Celexa and START the Fluoxetine daily We'll call you with your dermatology appt You are up to date on colonoscopy until 2023- yay! You are up to date on both pneumonia vaccines Call with any questions or concerns Have a great summer!!!

## 2015-12-15 NOTE — Progress Notes (Signed)
Pre visit review using our clinic review tool, if applicable. No additional management support is needed unless otherwise documented below in the visit note. 

## 2015-12-15 NOTE — Assessment & Plan Note (Signed)
Chronic problem.  Pt is not following a particular diet or getting regular exercise- stressed the need for both.  Check labs to risk stratify.  Will follow.

## 2015-12-15 NOTE — Assessment & Plan Note (Signed)
Pt's PE WNL w/ exception of obesity and multiple scattered skin lesions.  UTD on colonoscopy, pneumonia vaccines, urology.  Written screening schedule updated and given to pt.  Check labs.  Anticipatory guidance provided.

## 2015-12-15 NOTE — Progress Notes (Signed)
   Subjective:    Patient ID: Timothy Mckinney, male    DOB: June 29, 1951, 65 y.o.   MRN: [id number]  HPI Here today for CPE.  Risk Factors: HTN- chronic problem, on Metoprolol.  Good BP control. Obesity- chronic problem.  Pt's BMI 41.   Sarcoid- chronic problem, following w/ Dr Leeanne Deed.  Currently asymptomatic.   Physical Activity: golfing, no other formal exercise Fall Risk: low Depression: more anxiety than depression.  He was switched off the Zoloft to the Celexa but notes increased fatigue.  Some improvement in anxiety- no longer avoiding going out, started playing golf again Hearing: normal to conversational tones and mildly decreased to whispered voice at 6 ft ADL's: independent Cognitive: normal linear thought process, memory and attention intact Home Safety: safe at home Height, Weight, BMI, Visual Acuity: see vitals, vision corrected to 20/20 w/ glasses Counseling: UTD on colonoscopy (due 2023), UTD on urology.  UTD on immunizations Care team reviewed and updated w/ pt Labs Ordered: See A&P Care Plan: See A&P    Review of Systems Patient reports no vision/hearing changes, anorexia, fever ,adenopathy, persistant/recurrent hoarseness, swallowing issues, chest pain, palpitations, edema, persistant/recurrent cough, hemoptysis, dyspnea (rest,exertional, paroxysmal nocturnal), gastrointestinal  bleeding (melena, rectal bleeding), abdominal pain, excessive heart burn, GU symptoms (dysuria, hematuria, voiding/incontinence issues) syncope, focal weakness, memory loss, numbness & tingling, skin/hair/nail changes, depression, anxiety, abnormal bruising/bleeding, musculoskeletal symptoms/signs.   + fatigue Skin lesion- anterior to L ear, asking for new derm referral    Objective:   Physical Exam General Appearance:    Alert, cooperative, no distress, appears stated age, morbidly obese  Head:    Normocephalic, without obvious abnormality, atraumatic  Eyes:    PERRL, conjunctiva/corneas  clear, EOM's intact, fundi    benign, both eyes       Ears:    Normal TM's and external ear canals, both ears  Nose:   Nares normal, septum midline, mucosa normal, no drainage   or sinus tenderness  Throat:   Lips, mucosa, and tongue normal; teeth and gums normal  Neck:   Supple, symmetrical, trachea midline, no adenopathy;       thyroid:  No enlargement/tenderness/nodules  Back:     Symmetric, no curvature, ROM normal, no CVA tenderness  Lungs:     Clear to auscultation bilaterally, respirations unlabored  Chest wall:    No tenderness or deformity  Heart:    Regular rate and rhythm, S1 and S2 normal, no murmur, rub   or gallop  Abdomen:     Soft, non-tender, bowel sounds active all four quadrants,    no masses, no organomegaly  Genitalia:    Deferred to urology  Rectal:    Extremities:   Extremities normal, atraumatic, no cyanosis or edema  Pulses:   2+ and symmetric all extremities  Skin:   Skin color, texture, turgor normal, no rashes or lesions  Lymph nodes:   Cervical, supraclavicular, and axillary nodes normal  Neurologic:   CNII-XII intact. Normal strength, sensation and reflexes      throughout          Assessment & Plan:

## 2015-12-15 NOTE — Assessment & Plan Note (Signed)
Chronic problem.  Adequate control.  Asymptomatic.  Check labs.  No anticipated med changes 

## 2016-01-12 DIAGNOSIS — L718 Other rosacea: Secondary | ICD-10-CM | POA: Diagnosis not present

## 2016-01-12 DIAGNOSIS — L918 Other hypertrophic disorders of the skin: Secondary | ICD-10-CM | POA: Diagnosis not present

## 2016-01-12 DIAGNOSIS — L821 Other seborrheic keratosis: Secondary | ICD-10-CM | POA: Diagnosis not present

## 2016-01-12 DIAGNOSIS — L720 Epidermal cyst: Secondary | ICD-10-CM | POA: Diagnosis not present

## 2016-02-05 ENCOUNTER — Ambulatory Visit (INDEPENDENT_AMBULATORY_CARE_PROVIDER_SITE_OTHER): Payer: PPO | Admitting: Family Medicine

## 2016-02-05 ENCOUNTER — Encounter: Payer: Self-pay | Admitting: Family Medicine

## 2016-02-05 VITALS — BP 122/82 | HR 81 | Temp 97.9°F | Resp 16 | Ht 71.0 in | Wt 290.1 lb

## 2016-02-05 DIAGNOSIS — F411 Generalized anxiety disorder: Secondary | ICD-10-CM

## 2016-02-05 MED ORDER — FLUOXETINE HCL 20 MG PO TABS: 20.0000 mg | ORAL_TABLET | Freq: Every day | ORAL | 1 refills | Status: DC

## 2016-02-05 NOTE — Progress Notes (Signed)
Pre visit review using our clinic review tool, if applicable. No additional management support is needed unless otherwise documented below in the visit note. 

## 2016-02-05 NOTE — Patient Instructions (Addendum)
Follow up in mid-late December to recheck BP Continue the Prozac once daily I think you're doing great!!! Call with any questions or concerns Happy Early Rudene Anda!!!

## 2016-02-05 NOTE — Assessment & Plan Note (Signed)
Improved since starting Prozac 20mg .  Pt is not having side effects, feels anxiety is better and not interested in increasing dose.  Will continue to follow.

## 2016-02-05 NOTE — Progress Notes (Signed)
   Subjective:    Patient ID: Timothy Mckinney, male    DOB: 28-Sep-1950, 65 y.o.   MRN: [id number]  HPI Anxiety- pt is currently on Prozac 20mg  daily.  Pt feels he is less anxious about things- not having trouble going out to dinner anymore.  Pt denies side effects at this time.  Pt is sleeping more- which is helping w/ anxiety.   Review of Systems For ROS see HPI     Objective:   Physical Exam  Constitutional: He is oriented to person, place, and time. He appears well-developed and well-nourished. No distress.  HENT:  Head: Normocephalic and atraumatic.  Neurological: He is alert and oriented to person, place, and time.  Skin: Skin is warm and dry.  Psychiatric: He has a normal mood and affect. His behavior is normal. Thought content normal.  Vitals reviewed.         Assessment & Plan:

## 2016-04-10 ENCOUNTER — Other Ambulatory Visit: Payer: Self-pay | Admitting: Family Medicine

## 2016-04-10 DIAGNOSIS — I10 Essential (primary) hypertension: Secondary | ICD-10-CM

## 2016-05-03 DIAGNOSIS — H524 Presbyopia: Secondary | ICD-10-CM | POA: Diagnosis not present

## 2016-05-03 DIAGNOSIS — H52223 Regular astigmatism, bilateral: Secondary | ICD-10-CM | POA: Diagnosis not present

## 2016-05-03 DIAGNOSIS — H5203 Hypermetropia, bilateral: Secondary | ICD-10-CM | POA: Diagnosis not present

## 2016-06-17 ENCOUNTER — Ambulatory Visit (INDEPENDENT_AMBULATORY_CARE_PROVIDER_SITE_OTHER): Payer: Self-pay | Admitting: Family Medicine

## 2016-06-17 ENCOUNTER — Telehealth: Payer: Self-pay | Admitting: Family Medicine

## 2016-06-17 ENCOUNTER — Encounter: Payer: Self-pay | Admitting: Family Medicine

## 2016-06-17 VITALS — BP 136/86 | HR 76 | Temp 98.2°F | Resp 16 | Ht 71.0 in | Wt 299.4 lb

## 2016-06-17 DIAGNOSIS — I10 Essential (primary) hypertension: Secondary | ICD-10-CM

## 2016-06-17 DIAGNOSIS — R0982 Postnasal drip: Secondary | ICD-10-CM

## 2016-06-17 DIAGNOSIS — F411 Generalized anxiety disorder: Secondary | ICD-10-CM | POA: Diagnosis not present

## 2016-06-17 LAB — HEPATIC FUNCTION PANEL
ALBUMIN: 4.1 g/dL (ref 3.5–5.2)
ALK PHOS: 80 U/L (ref 39–117)
ALT: 56 U/L — ABNORMAL HIGH (ref 0–53)
AST: 96 U/L — AB (ref 0–37)
BILIRUBIN DIRECT: 0.4 mg/dL — AB (ref 0.0–0.3)
TOTAL PROTEIN: 6.7 g/dL (ref 6.0–8.3)
Total Bilirubin: 1.2 mg/dL (ref 0.2–1.2)

## 2016-06-17 LAB — LIPID PANEL
CHOLESTEROL: 195 mg/dL (ref 0–200)
HDL: 64.1 mg/dL (ref >39.00)
LDL CALC: 114 mg/dL — AB (ref 0–99)
NonHDL: 130.52
TRIGLYCERIDES: 85 mg/dL (ref 0.0–149.0)
Total CHOL/HDL Ratio: 3
VLDL: 17 mg/dL (ref 0.0–40.0)

## 2016-06-17 LAB — CBC WITH DIFFERENTIAL/PLATELET
Basophils Absolute: 0 10*3/uL (ref 0.0–0.1)
Basophils Relative: 0.4 % (ref 0.0–3.0)
EOS PCT: 3.4 % (ref 0.0–5.0)
Eosinophils Absolute: 0.1 10*3/uL (ref 0.0–0.7)
HCT: 43.1 % (ref 39.0–52.0)
Hemoglobin: 14.9 g/dL (ref 13.0–17.0)
Lymphocytes Relative: 22.3 % (ref 12.0–46.0)
Lymphs Abs: 0.8 10*3/uL (ref 0.7–4.0)
MCHC: 34.7 g/dL (ref 30.0–36.0)
MCV: 103.5 fl — AB (ref 78.0–100.0)
MONO ABS: 0.3 10*3/uL (ref 0.1–1.0)
Monocytes Relative: 8.1 % (ref 3.0–12.0)
NEUTROS ABS: 2.3 10*3/uL (ref 1.4–7.7)
NEUTROS PCT: 65.8 % (ref 43.0–77.0)
RBC: 4.16 Mil/uL — AB (ref 4.22–5.81)
RDW: 14.7 % (ref 11.5–15.5)
WBC: 3.5 10*3/uL — ABNORMAL LOW (ref 4.0–10.5)

## 2016-06-17 LAB — BASIC METABOLIC PANEL
BUN: 13 mg/dL (ref 6–23)
CALCIUM: 9 mg/dL (ref 8.4–10.5)
CO2: 31 meq/L (ref 19–32)
Chloride: 97 mEq/L (ref 96–112)
Creatinine, Ser: 0.74 mg/dL (ref 0.40–1.50)
GFR: 112.02 mL/min (ref >60.00)
GLUCOSE: 139 mg/dL — AB (ref 70–99)
Potassium: 4.6 mEq/L (ref 3.5–5.1)
SODIUM: 137 meq/L (ref 135–145)

## 2016-06-17 LAB — TSH: TSH: 1.43 u[IU]/mL (ref 0.35–4.50)

## 2016-06-17 MED ORDER — FLUOXETINE HCL 10 MG PO TABS: 10.0000 mg | ORAL_TABLET | Freq: Every day | ORAL | 3 refills | Status: DC

## 2016-06-17 NOTE — Progress Notes (Signed)
Pre visit review using our clinic review tool, if applicable. No additional management support is needed unless otherwise documented below in the visit note. 

## 2016-06-17 NOTE — Telephone Encounter (Signed)
Patient notified of PCP recommendations and is agreement and expresses an understanding.  

## 2016-06-17 NOTE — Telephone Encounter (Signed)
Pt states that KT advise him to take zyrtec, pt asking if he needs to take one or two pills, pt states that on the bottle it says to ask a Dr if over 35 yrs.

## 2016-06-17 NOTE — Patient Instructions (Signed)
Schedule your complete physical in 6 months We'll notify you of your lab results and make any changes if needed Decrease the Prozac to 10mg  daily (new prescription sent) for 2 weeks and then either decrease to 1/2 tab daily or 1 pill every other day x2 weeks and then stop meds entirely If no improvement in the tremor, please let me know!!! Start daily Zyrtec to improve the post-nasal drip Drink plenty of fluids Call with any questions or concerns Happy Holidays!!!

## 2016-06-17 NOTE — Progress Notes (Signed)
   Subjective:    Patient ID: Timothy Mckinney, male    DOB: 01-14-1951, 65 y.o.   MRN: [id number]  HPI HTN- chronic problem.  BP is mildly elevated today but still within range.  On Metoprolol daily.  Has gained 10 lbs since last visit.  Not exercising regularly but has plans to change this.  Denies CP, SOB, HAs, visual changes, edema.  Anxiety- pt started the Prozac and since then has noticed increased sleeping and also fine tremor of L hand.  Has never had a tremor previously.  Is not sure if this is due to his medication or not.    PND- pt reports he has a 'drain of mucous' nightly down his throat and into his stomach.  sxs have worsened in the last few months.     Review of Systems For ROS see HPI     Objective:   Physical Exam  Constitutional: He is oriented to person, place, and time. He appears well-developed and well-nourished. No distress.  HENT:  Head: Normocephalic and atraumatic.  No TTP over sinuses + turbinate edema + PND TMs normal bilaterally  Eyes: Conjunctivae and EOM are normal. Pupils are equal, round, and reactive to light.  Neck: Normal range of motion. Neck supple.  Cardiovascular: Normal rate, regular rhythm, normal heart sounds and intact distal pulses.   Pulmonary/Chest: Effort normal and breath sounds normal. No respiratory distress. He has no wheezes.  Musculoskeletal: He exhibits no edema.  Lymphadenopathy:    He has no cervical adenopathy.  Neurological: He is alert and oriented to person, place, and time.  Fine tremors of hands bilaterally  Skin: Skin is warm and dry.  Psychiatric: He has a normal mood and affect. His behavior is normal. Thought content normal.  Vitals reviewed.         Assessment & Plan:

## 2016-06-18 ENCOUNTER — Other Ambulatory Visit (INDEPENDENT_AMBULATORY_CARE_PROVIDER_SITE_OTHER): Payer: PPO

## 2016-06-18 ENCOUNTER — Other Ambulatory Visit: Payer: Self-pay | Admitting: Family Medicine

## 2016-06-18 DIAGNOSIS — R7309 Other abnormal glucose: Secondary | ICD-10-CM | POA: Diagnosis not present

## 2016-06-18 DIAGNOSIS — R7989 Other specified abnormal findings of blood chemistry: Secondary | ICD-10-CM

## 2016-06-18 LAB — HEMOGLOBIN A1C: HEMOGLOBIN A1C: 4.7 % (ref 4.6–6.5)

## 2016-06-19 DIAGNOSIS — N200 Calculus of kidney: Secondary | ICD-10-CM | POA: Diagnosis not present

## 2016-06-19 DIAGNOSIS — M549 Dorsalgia, unspecified: Secondary | ICD-10-CM | POA: Diagnosis not present

## 2016-08-30 ENCOUNTER — Encounter: Payer: Self-pay | Admitting: General Practice

## 2016-08-30 ENCOUNTER — Telehealth: Payer: Self-pay | Admitting: Family Medicine

## 2016-08-30 NOTE — Telephone Encounter (Signed)
Re-evaluation if has been going on that long.

## 2016-08-30 NOTE — Telephone Encounter (Signed)
(  Voicemail retrieved on phone at front desk on 08/30/16 at 3:29pm)  Patient states he was seen in December for congestion and drainage.  He was instructed to take an otc medication(does not remember the name of the med).  The pharmacist also recommended he try flonase.  He has tried both for the past month with no relief.    Patient wants to know if pcp can recommend something else to take or does he need to come in for ov for re-evaluation.

## 2016-08-30 NOTE — Telephone Encounter (Signed)
Pt made aware. He is going to try the zyrtec and mucinex this weekend and call on Monday if no better.

## 2016-08-30 NOTE — Telephone Encounter (Signed)
Do you think you could advise?

## 2016-09-16 DIAGNOSIS — L718 Other rosacea: Secondary | ICD-10-CM | POA: Diagnosis not present

## 2016-09-16 DIAGNOSIS — L821 Other seborrheic keratosis: Secondary | ICD-10-CM | POA: Diagnosis not present

## 2016-09-16 DIAGNOSIS — L570 Actinic keratosis: Secondary | ICD-10-CM | POA: Diagnosis not present

## 2016-10-01 ENCOUNTER — Other Ambulatory Visit: Payer: Self-pay | Admitting: Family Medicine

## 2016-10-01 DIAGNOSIS — I10 Essential (primary) hypertension: Secondary | ICD-10-CM

## 2016-10-18 DIAGNOSIS — M25562 Pain in left knee: Secondary | ICD-10-CM | POA: Diagnosis not present

## 2016-10-18 DIAGNOSIS — M79672 Pain in left foot: Secondary | ICD-10-CM | POA: Diagnosis not present

## 2016-10-18 DIAGNOSIS — M546 Pain in thoracic spine: Secondary | ICD-10-CM | POA: Diagnosis not present

## 2016-10-18 DIAGNOSIS — Z96652 Presence of left artificial knee joint: Secondary | ICD-10-CM | POA: Diagnosis not present

## 2016-10-18 DIAGNOSIS — Z471 Aftercare following joint replacement surgery: Secondary | ICD-10-CM | POA: Diagnosis not present

## 2016-11-06 ENCOUNTER — Encounter: Payer: Self-pay | Admitting: Physician Assistant

## 2016-11-06 ENCOUNTER — Ambulatory Visit (INDEPENDENT_AMBULATORY_CARE_PROVIDER_SITE_OTHER): Payer: PPO | Admitting: Physician Assistant

## 2016-11-06 ENCOUNTER — Telehealth: Payer: Self-pay | Admitting: Family Medicine

## 2016-11-06 ENCOUNTER — Ambulatory Visit (INDEPENDENT_AMBULATORY_CARE_PROVIDER_SITE_OTHER): Payer: PPO

## 2016-11-06 VITALS — BP 138/70 | HR 78 | Temp 98.2°F | Resp 16 | Ht 71.0 in | Wt 299.0 lb

## 2016-11-06 DIAGNOSIS — S6991XA Unspecified injury of right wrist, hand and finger(s), initial encounter: Secondary | ICD-10-CM | POA: Diagnosis not present

## 2016-11-06 DIAGNOSIS — M545 Low back pain, unspecified: Secondary | ICD-10-CM

## 2016-11-06 MED ORDER — HYDROCODONE-ACETAMINOPHEN 5-325 MG PO TABS: 1.0000 | ORAL_TABLET | Freq: Four times a day (QID) | ORAL | 0 refills | Status: DC | PRN

## 2016-11-06 NOTE — Telephone Encounter (Signed)
Patient Name: Timothy Mckinney  DOB: April 02, 1951    Initial Comment Caller states his back is still bothering him from a fall he had recently.    Nurse Assessment  Nurse: Mallie Mussel, RN, Alveta Heimlich Date/Time Eilene Ghazi Time): 11/06/2016 11:31:35 AM  Confirm and document reason for call. If symptomatic, describe symptoms. ---Caller states that he fell about 2-1/2 weeks ago and hurt his back. He is still having problems. He has pain in his med and lower back. He rates his pain as 5-6 on 0-10 scale. He had some leftover pain med from an orthopedic doctor and he took that for the first two weeks. It was Hydrocodone and he was taking 1 pill per day to stay comfortable. He is out now and hasn't had one since Saturday. 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 He thinks he broke all of his toes on his left foot. They swelled up and he couldn't move them. He has seen an orthopedic doctor since this happened and he has been x-rayed in his back, his knee that was replaced, and his foot.  Does the patient have any new or worsening symptoms? ---Yes  Will a triage be completed? ---Yes  Related visit to physician within the last 2 weeks? ---Yes  Does the PT have any chronic conditions? (i.e. diabetes, asthma, etc.) ---Yes  List chronic conditions. ---Arthritis  Is this a behavioral health or substance abuse call? ---No     Guidelines    Guideline Title Affirmed Question Affirmed Notes  Back Injury [1] High-risk adult (e.g., age > 55, osteoporosis, chronic steroid use) AND [2] still hurts    Final Disposition User   See Physician within [facility], RN, Alveta Heimlich    Comments  There are no appointments with Dr. Birdie Riddle remaining for today. I was able to schedule him to be seen at 4:00pm today with Elyn Aquas PA. The caller has another appointment at 1:00pm today.   Referrals  REFERRED TO PCP OFFICE   Disagree/Comply: Comply

## 2016-11-06 NOTE — Progress Notes (Signed)
Patient presents to clinic today c/o mid/lower back pain, mainly R-sided s/p fall 2 weeks ago. Patient endorses walking in the bathroom with slipping on wet surface. States he fell down with left hyperflexing underneath him. Required help getting up. Noted significant pain at time of fall with bruising to L foot and ankle. Was concerned he may have broken his toes.  Notes going to see his Orthopedist the day after the incident. Endorses having complete assessment including x-rays of foot/ankle, left knee, and thoracic and lumbar spines. No images for review. Negative for fractures per patient. Was told to start RICE and started on hydrocodone for pain. Has been taking as directed. Notes complete resolution of pain and swelling in foot/ankle/knee. Still having back pain that is mild-moderate mainly with occasional increase in severity with ambulation. Is alleviated by laying down. Again is mainly R sided without radiation into lower extremities. Denies numbness/tingling or weakness of extremity. Denies saddle anesthesia or change to bowel/bladder habits.  Patient does note that the distal 5th phalanx of R hand has been deformed looking since the fall. States that he notes stiffness in the joint. Is harder to move on its own but denies pain. Denies any noted trauma to this area during fall.  Past Medical History:  Diagnosis Date  . Abnormal chest x-ray    ? granulomata  . Arthritis    knees and back   . DDD (degenerative disc disease), lumbar    MRI 08/2005  . Dry eyes, bilateral    Dr Bing Plume  . Elevated liver enzymes    HISTORY OF ELEVATED LIVER ENZYMES  . GERD (gastroesophageal reflux disease)    hx of   . Hypertension    NEW DIAGNOSIS AND STARTED ON METOPROLOL ON 10/04/11  . Hyperuricemia   . 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 Nephrolithiasis    Dr Serita Butcher  . Sleep apnea    STOP BANG SCORE OF 4  . Thrombocytopenia ([place])    HX OF LOW PLATELET COUNT    Current Outpatient  Prescriptions on File Prior to Visit  Medication Sig Dispense Refill  . cycloSPORINE (RESTASIS) 0.05 % ophthalmic emulsion Place 1 drop into both eyes daily.    . diphenhydrAMINE (BENADRYL) 25 MG tablet Take 25 mg by mouth. ONE AT BEDTIME IF NEEDED SLEEP    . Flaxseed, Linseed, (FLAXSEED OIL MAX STR) 1300 MG CAPS Take 2 capsules by mouth daily.    . meloxicam (MOBIC) 15 MG tablet take 1 tablet by mouth once daily 90 tablet 1  . metoprolol succinate (TOPROL-XL) 50 MG 24 hr tablet take 1 tablet by mouth once daily 90 tablet 1  . sildenafil (REVATIO) 20 MG tablet TAKE 2-5 TABLETS BY MOUTH 1 HOUR BEFORE AS NEEDED  11   No current facility-administered medications on file prior to visit.     Allergies  Allergen Reactions  . Penicillins Swelling    Diffuse swelling    Family History  Problem Relation Age of Onset  . Heart attack Father 26  . Colon polyps Mother   . Dementia Mother        died post SDH  . Cancer Paternal Grandfather        throat , pipe smoker  . Stroke Neg Hx     Social History   Social History  . Marital status: Married    Spouse name: N/A  . Number of children: N/A  . Years of education: N/A   Social History Main Topics  .  Smoking status: Never Smoker  . Smokeless tobacco: Never Used  . Alcohol use 8.4 oz/week    14 Standard drinks or equivalent per week     Comment:  since 1/15  . Drug use: No  . Sexual activity: Not Asked   Other Topics Concern  . None   Social History Narrative  . None   Review of Systems - See HPI.  All other ROS are negative.  BP 138/70   Pulse 78   Temp 98.2 F (36.8 C) (Oral)   Resp 16   Ht 5\' 11"  (1.803 m)   Wt 299 lb (135.6 kg)   SpO2 97%   BMI 41.70 kg/m   Physical Exam  Constitutional: He is oriented to person, place, and time and well-developed, well-nourished, and in no distress.  HENT:  Head: Normocephalic and atraumatic.  Eyes: Conjunctivae are normal.  Neck: Neck supple.  Cardiovascular: Normal rate,  regular rhythm, normal heart sounds and intact distal pulses.   Pulmonary/Chest: Effort normal and breath sounds normal. No respiratory distress. He has no wheezes. He has no rales. He exhibits no tenderness.  Musculoskeletal:       Left knee: Normal.       Left ankle: Normal.       Cervical back: Normal.       Thoracic back: He exhibits normal range of motion, no bony tenderness and no spasm.       Lumbar back: He exhibits normal range of motion, no bony tenderness and no spasm.       Hands:      Left foot: Normal.  Positive tenderness to palpation of R thoracolumbar perispinous musculature.   Neurological: He is alert and oriented to person, place, and time.  Skin: Skin is warm and dry. No rash noted.  Psychiatric: Affect normal.  Vitals reviewed.  Assessment/Plan: 1. Hand injury, right, initial encounter X-ray today ruled out fracture. Recommend ortho for further assessment. Patient declines assessment at present. Supportive measures reviewed. - DG Hand Complete Right; Future  2. Acute right-sided low back pain without sciatica Will allo 5 days of Norco. Discussed transition to tylenol and appropriate dosing. Supportive measures reviewed. Further assessment if symptoms are not continuing to improve over the next few weeks. - HYDROcodone-acetaminophen (NORCO/VICODIN) 5-325 MG tablet; Take 1 tablet by mouth every 6 (six) hours as needed for moderate pain.  Dispense: 20 tablet; Refill: 0   Leeanne Rio, [place]

## 2016-11-06 NOTE — Patient Instructions (Signed)
Please speak with Levada Dy to get directions for x-ray. I will call you with your results.  Please take the meloxicam daily with food as directed by Dr. Birdie Riddle. Use the Hydrocodone sparingly for severe pain. Otherwise, tylenol for mild breakthrough pain. Avoid heavy lifting or overexertion.  Apply heating pad to the back for 10-15 minutes a few times per day.   If not continuing to improve, I would recommend Physical Therapy.

## 2016-11-06 NOTE — Telephone Encounter (Signed)
fyi, pt is seeing cody (he already read note)

## 2016-11-06 NOTE — Progress Notes (Signed)
Pre visit review using our clinic review tool, if applicable. No additional management support is needed unless otherwise documented below in the visit note. 

## 2016-12-06 ENCOUNTER — Telehealth: Payer: Self-pay

## 2016-12-06 NOTE — Telephone Encounter (Signed)
LM requesting CB regarding AWV. Requesting patient to have AWV with [facility] on 12/17/16 @ 0900 prior to CPE with PCP.

## 2016-12-17 ENCOUNTER — Ambulatory Visit (INDEPENDENT_AMBULATORY_CARE_PROVIDER_SITE_OTHER): Payer: PPO | Admitting: Family Medicine

## 2016-12-17 ENCOUNTER — Encounter: Payer: Self-pay | Admitting: Family Medicine

## 2016-12-17 VITALS — BP 142/73 | HR 69 | Temp 98.2°F | Resp 18 | Ht 71.0 in | Wt 301.6 lb

## 2016-12-17 DIAGNOSIS — Z0001 Encounter for general adult medical examination with abnormal findings: Secondary | ICD-10-CM

## 2016-12-17 DIAGNOSIS — E538 Deficiency of other specified B group vitamins: Secondary | ICD-10-CM

## 2016-12-17 DIAGNOSIS — M545 Low back pain, unspecified: Secondary | ICD-10-CM

## 2016-12-17 DIAGNOSIS — Z125 Encounter for screening for malignant neoplasm of prostate: Secondary | ICD-10-CM | POA: Diagnosis not present

## 2016-12-17 DIAGNOSIS — I10 Essential (primary) hypertension: Secondary | ICD-10-CM | POA: Diagnosis not present

## 2016-12-17 DIAGNOSIS — D693 Immune thrombocytopenic purpura: Secondary | ICD-10-CM

## 2016-12-17 DIAGNOSIS — F1010 Alcohol abuse, uncomplicated: Secondary | ICD-10-CM | POA: Diagnosis not present

## 2016-12-17 DIAGNOSIS — Z0000 Encounter for general adult medical examination without abnormal findings: Secondary | ICD-10-CM

## 2016-12-17 LAB — HEPATIC FUNCTION PANEL
ALT: 38 U/L (ref 0–53)
AST: 85 U/L — AB (ref 0–37)
Albumin: 3.9 g/dL (ref 3.5–5.2)
Alkaline Phosphatase: 91 U/L (ref 39–117)
BILIRUBIN DIRECT: 1 mg/dL — AB (ref 0.0–0.3)
BILIRUBIN TOTAL: 2.4 mg/dL — AB (ref 0.2–1.2)
TOTAL PROTEIN: 6.5 g/dL (ref 6.0–8.3)

## 2016-12-17 LAB — CBC WITH DIFFERENTIAL/PLATELET
BASOS PCT: 0.9 % (ref 0.0–3.0)
Basophils Absolute: 0 10*3/uL (ref 0.0–0.1)
EOS ABS: 0.2 10*3/uL (ref 0.0–0.7)
EOS PCT: 6 % — AB (ref 0.0–5.0)
HCT: 41.3 % (ref 39.0–52.0)
Hemoglobin: 14.2 g/dL (ref 13.0–17.0)
LYMPHS ABS: 0.8 10*3/uL (ref 0.7–4.0)
Lymphocytes Relative: 25 % (ref 12.0–46.0)
MCHC: 34.5 g/dL (ref 30.0–36.0)
MCV: 107.7 fl — ABNORMAL HIGH (ref 78.0–100.0)
MONO ABS: 0.3 10*3/uL (ref 0.1–1.0)
Monocytes Relative: 10.4 % (ref 3.0–12.0)
NEUTROS PCT: 57.7 % (ref 43.0–77.0)
Neutro Abs: 1.9 10*3/uL (ref 1.4–7.7)
Platelets: 91 10*3/uL — ABNORMAL LOW (ref 150.0–400.0)
RBC: 3.83 Mil/uL — ABNORMAL LOW (ref 4.22–5.81)
RDW: 14.5 % (ref 11.5–15.5)
WBC: 3.3 10*3/uL — ABNORMAL LOW (ref 4.0–10.5)

## 2016-12-17 LAB — BASIC METABOLIC PANEL
BUN: 13 mg/dL (ref 6–23)
CHLORIDE: 96 meq/L (ref 96–112)
CO2: 29 meq/L (ref 19–32)
Calcium: 9.5 mg/dL (ref 8.4–10.5)
Creatinine, Ser: 0.76 mg/dL (ref 0.40–1.50)
GFR: 108.47 mL/min (ref >60.00)
Glucose, Bld: 121 mg/dL — ABNORMAL HIGH (ref 70–99)
POTASSIUM: 4.6 meq/L (ref 3.5–5.1)
Sodium: 133 mEq/L — ABNORMAL LOW (ref 135–145)

## 2016-12-17 LAB — LIPID PANEL
CHOL/HDL RATIO: 5
Cholesterol: 205 mg/dL — ABNORMAL HIGH (ref 0–200)
HDL: 41.6 mg/dL (ref >39.00)
LDL Cholesterol: 140 mg/dL — ABNORMAL HIGH (ref 0–99)
NonHDL: 163.3
TRIGLYCERIDES: 119 mg/dL (ref 0.0–149.0)
VLDL: 23.8 mg/dL (ref 0.0–40.0)

## 2016-12-17 LAB — HEMOGLOBIN A1C: HEMOGLOBIN A1C: 4.9 % (ref 4.6–6.5)

## 2016-12-17 LAB — TSH: TSH: 2.89 u[IU]/mL (ref 0.35–4.50)

## 2016-12-17 LAB — B12 AND FOLATE PANEL
Folate: 4 ng/mL — ABNORMAL LOW (ref >5.9)
VITAMIN B 12: 460 pg/mL (ref 211–911)

## 2016-12-17 LAB — PSA, MEDICARE: PSA: 0.17 ng/ml (ref 0.10–4.00)

## 2016-12-17 NOTE — Patient Instructions (Addendum)
Follow up in 2-3 weeks to recheck BP and alcohol use We'll notify you of your lab results and make any changes if needed If the liver enzymes are normal, we will start the Naltrexone to help you stop drinking Please consider going to an [place] meeting near you We will call you with your Ortho appt for you back pain Continue to work on healthy diet and regular exercise- you can do it! Call with any questions or concerns Hang in there!!  You can do this!!!   Bring a copy of your advance directives to your next office visit.  Continue doing brain stimulating activities (puzzles, reading, adult coloring books, staying active) to keep memory sharp.   Health Maintenance, Male A healthy lifestyle and preventive care is important for your health and wellness. Ask your health care provider about what schedule of regular examinations is right for you. What should I know about weight and diet? Eat a Healthy Diet  Eat plenty of vegetables, fruits, whole grains, low-fat dairy products, and lean protein.  Do not eat a lot of foods high in solid fats, added sugars, or salt.  Maintain a Healthy Weight Regular exercise can help you achieve or maintain a healthy weight. You should:  Do at least 150 minutes of exercise each week. The exercise should increase your heart rate and make you sweat (moderate-intensity exercise).  Do strength-training exercises at least twice a week.  Watch Your Levels of Cholesterol and Blood Lipids  Have your blood tested for lipids and cholesterol every 5 years starting at 68 years of age. If you are at high risk for heart disease, you should start having your blood tested when you are 68 years old. You may need to have your cholesterol levels checked more often if: ? Your lipid or cholesterol levels are high. ? You are older than 68 years of age. ? You are at high risk for heart disease.  What should I know about cancer screening? Many types of cancers can be detected  early and may often be prevented. Lung Cancer  You should be screened every year for lung cancer if: ? You are a current smoker who has smoked for at least 30 years. ? You are a former smoker who has quit within the past 15 years.  Talk to your health care provider about your screening options, when you should start screening, and how often you should be screened.  Colorectal Cancer  Routine colorectal cancer screening usually begins at 68 years of age and should be repeated every 5-10 years until you are 68 years old. You may need to be screened more often if early forms of precancerous polyps or small growths are found. Your health care provider may recommend screening at an earlier age if you have risk factors for colon cancer.  Your health care provider may recommend using home test kits to check for hidden blood in the stool.  A small camera at the end of a tube can be used to examine your colon (sigmoidoscopy or colonoscopy). This checks for the earliest forms of colorectal cancer.  Prostate and Testicular Cancer  Depending on your age and overall health, your health care provider may do certain tests to screen for prostate and testicular cancer.  Talk to your health care provider about any symptoms or concerns you have about testicular or prostate cancer.  Skin Cancer  Check your skin from head to toe regularly.  Tell your health care provider about any new moles  or changes in moles, especially if: ? There is a change in a mole's size, shape, or color. ? You have a mole that is larger than a pencil eraser.  Always use sunscreen. Apply sunscreen liberally and repeat throughout the day.  Protect yourself by wearing long sleeves, pants, a wide-brimmed hat, and sunglasses when outside.  What should I know about heart disease, diabetes, and high blood pressure?  If you are 40-52 years of age, have your blood pressure checked every 3-5 years. If you are 88 years of age or older,  have your blood pressure checked every year. You should have your blood pressure measured twice-once when you are at a hospital or clinic, and once when you are not at a hospital or clinic. Record the average of the two measurements. To check your blood pressure when you are not at a hospital or clinic, you can use: ? An automated blood pressure machine at a pharmacy. ? A home blood pressure monitor.  Talk to your health care provider about your target blood pressure.  If you are between 34-50 years old, ask your health care provider if you should take aspirin to prevent heart disease.  Have regular diabetes screenings by checking your fasting blood sugar level. ? If you are at a normal weight and have a low risk for diabetes, have this test once every three years after the age of 32. ? If you are overweight and have a high risk for diabetes, consider being tested at a younger age or more often.  A one-time screening for abdominal aortic aneurysm (AAA) by ultrasound is recommended for men aged 19-75 years who are current or former smokers. What should I know about preventing infection? Hepatitis B If you have a higher risk for hepatitis B, you should be screened for this virus. Talk with your health care provider to find out if you are at risk for hepatitis B infection. Hepatitis C Blood testing is recommended for:  Everyone born from [birth year] through 1967.  Anyone with known risk factors for hepatitis C.  Sexually Transmitted Diseases (STDs)  You should be screened each year for STDs including gonorrhea and chlamydia if: ? You are sexually active and are younger than 68 years of age. ? You are older than 68 years of age and your health care provider tells you that you are at risk for this type of infection. ? Your sexual activity has changed since you were last screened and you are at an increased risk for chlamydia or gonorrhea. Ask your health care provider if you are at risk.  Talk with  your health care provider about whether you are at high risk of being infected with HIV. Your health care provider may recommend a prescription medicine to help prevent HIV infection.  What else can I do?  Schedule regular health, dental, and eye exams.  Stay current with your vaccines (immunizations).  Do not use any tobacco products, such as cigarettes, chewing tobacco, and e-cigarettes. If you need help quitting, ask your health care provider.  Limit alcohol intake to no more than 2 drinks per day. One drink equals 12 ounces of beer, 5 ounces of wine, or 1 ounces of hard liquor.  Do not use street drugs.  Do not share needles.  Ask your health care provider for help if you need support or information about quitting drugs.  Tell your health care provider if you often feel depressed.  Tell your health care provider if you have  ever been abused or do not feel safe at home. This information is not intended to replace advice given to you by your health care provider. Make sure you discuss any questions you have with your health care provider. Document Released: 12/14/2007 Document Revised: 02/14/2016 Document Reviewed: 03/21/2015 Elsevier Interactive Patient Education  2018 [employer]  

## 2016-12-17 NOTE — Progress Notes (Addendum)
Subjective:   Timothy Mckinney is a 66 y.o. male who presents for Medicare Annual/Subsequent preventive examination.  Review of Systems:  No ROS.  Medicare Wellness Visit. Additional risk factors are reflected in the social history.  Cardiac Risk Factors include: advanced age (>24men, >30 women);hypertension;family history of premature cardiovascular disease;obesity (BMI >30kg/m2)   Sleep patterns: Sleeps 6-7 hours. Feels rested.  Home Safety/Smoke Alarms: Feels safe in home. Smoke alarms in place.  Living environment; residence and Firearm Safety: Lives with wife in 2 story home.  Seat Belt Safety/Bike Helmet: Wears seat belt.   Counseling:   Eye Exam-Last exam 03/2017, yearly [facility]-Last exam 09/2016, every 6 months. Dr. Aleda Grana   Male:   [facility]-Colonoscopy 03/25/2012, polyps. Recall 5 years.       PSA-Followed by Urology Lab Results  Component Value Date   PSA 0.16 12/15/2015       Objective:    Vitals: BP (!) 152/82 (BP Location: Left Arm, Patient Position: Sitting, Cuff Size: Normal)   Pulse 69   Temp 98.2 F (36.8 C) (Oral)   Resp 18   Ht 5\' 11"  (1.803 m)   Wt (!) 301 lb 9.6 oz (136.8 kg)   SpO2 98%   BMI 42.06 kg/m   Body mass index is 42.06 kg/m.  Tobacco History  Smoking Status  . Never Smoker  Smokeless Tobacco  . Never Used     Counseling given: No   Past Medical History:  Diagnosis Date  . Abnormal chest x-ray    ? granulomata  . Arthritis    knees and back   . DDD (degenerative disc disease), lumbar    MRI 08/2005  . Dry eyes, bilateral    Dr Bing Plume  . Elevated liver enzymes    HISTORY OF ELEVATED LIVER ENZYMES  . GERD (gastroesophageal reflux disease)    hx of   . Hypertension    NEW DIAGNOSIS AND STARTED ON METOPROLOL ON 10/04/11  . Hyperuricemia   . 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 Nephrolithiasis    Dr Serita Butcher  . Sleep apnea    STOP BANG SCORE OF 4  . Thrombocytopenia ([place])    HX OF LOW PLATELET COUNT     Past Surgical History:  Procedure Laterality Date  . ANAL FISTULECTOMY  06/01/2012   Procedure: FISTULECTOMY ANAL;  Surgeon: Leighton Ruff, MD;  Location: [facility];  Service: General;  Laterality: N/A;  Mucosal Advancement Flap   . CARDIAC CATHETERIZATION     in 40s, negative  . colon ulcer  2011  . COLONOSCOPY W/ POLYPECTOMY  2011   Dr Sharlett Iles  . KNEE ARTHROSCOPY  2009    L  . KNEE SURGERY     april 2013 total knee replacement removed  . LITHOTRIPSY    . tear duct plugs    . TOTAL KNEE ARTHROPLASTY  2011   Workman's Comp case, Dr Veverly Fells   Family History  Problem Relation Age of Onset  . Heart attack Father 54  . Colon polyps Mother   . Dementia Mother        died post SDH  . Cancer Paternal Grandfather        throat , pipe smoker  . Colon polyps Sister   . Stroke Neg Hx    History  Sexual Activity  . Sexual activity: Not on file    Outpatient Encounter Prescriptions as of 12/17/2016  Medication Sig  . cycloSPORINE (RESTASIS) 0.05 % ophthalmic emulsion Place 1 drop into  both eyes daily.  . diphenhydrAMINE (BENADRYL) 25 MG tablet Take 25 mg by mouth. ONE AT BEDTIME IF NEEDED SLEEP  . Flaxseed, Linseed, (FLAXSEED OIL MAX STR) 1300 MG CAPS Take 2 capsules by mouth daily.  Marland Kitchen HYDROcodone-acetaminophen (NORCO/VICODIN) 5-325 MG tablet Take 1 tablet by mouth every 6 (six) hours as needed for moderate pain.  . Loperamide HCl (IMODIUM PO) Take by mouth daily.  . meloxicam (MOBIC) 15 MG tablet take 1 tablet by mouth once daily  . metoprolol succinate (TOPROL-XL) 50 MG 24 hr tablet take 1 tablet by mouth once daily  . Simethicone (GAS-X PO) Take by mouth daily.  . sildenafil (REVATIO) 20 MG tablet TAKE 2-5 TABLETS BY MOUTH 1 HOUR BEFORE AS NEEDED   No facility-administered encounter medications on file as of 12/17/2016.     Activities of Daily Living In your present state of health, do you have any difficulty performing the following activities: 12/17/2016  Hearing? N  Vision?  N  Difficulty concentrating or making decisions? N  Walking or climbing stairs? N  Dressing or bathing? N  Doing errands, shopping? N  Preparing Food and eating ? N  Using the Toilet? N  In the past six months, have you accidently leaked urine? N  Do you have problems with loss of bowel control? N  Managing your Medications? N  Managing your Finances? N  Housekeeping or managing your Housekeeping? N  Some recent data might be hidden    Patient Care Team: [name], MD as PCP - General (Family Medicine) Christy Sartorius, MD as Referring Physician (Urology) Noralee Space, MD as Consulting Physician (Pulmonary Disease) Juanita Craver, MD as Consulting Physician (Gastroenterology)   Assessment:    Physical assessment deferred to PCP.  Exercise Activities and Dietary recommendations Current Exercise Habits: The patient does not participate in regular exercise at present, Exercise limited by: orthopedic condition(s) (Back and knees)   Diet (meal preparation, eat out, water intake, caffeinated beverages, dairy products, fruits and vegetables): Drinks water (4 glasses), occasional diet soda.   Breakfast: yogurt, cereal, english muffin, bagel Lunch: fruit, salad, fast food Dinner: protein, vegetables, salad      Discussed heart healthy diet and activity level.   Goals    . Weight (lb) < 250 lb (113.4 kg)          Lose weight by controlling carb intake and snacking at night.       Fall Risk Fall Risk  12/17/2016 12/15/2015 08/09/2015 12/09/2014 10/20/2014  Falls in the past year? Yes No No No No  Number falls in past yr: 1 - - - -  Injury with Fall? No - - - -  Risk for fall due to : Impaired balance/gait - - - -  Follow up Falls prevention discussed - - - -   Depression Screen PHQ 2/9 Scores 12/17/2016 12/15/2015 08/09/2015 12/09/2014  PHQ - 2 Score 0 0 0 0  Exception Documentation - - Patient refusal -    Cognitive Function       Ad8 score reviewed for  issues:  Issues making decisions: no  Less interest in hobbies / activities: no  Repeats questions, stories (family complaining): no  Trouble using ordinary gadgets (microwave, computer, phone): no  Forgets the month or year: no  Mismanaging finances: no  Remembering appts: no  Daily problems with thinking and/or memory: no Ad8 score is=0     Immunization History  Administered Date(s) Administered  . Pneumococcal Conjugate-13 08/05/2014  .  Pneumococcal Polysaccharide-23 08/09/2015  . Tetanus 07/26/2008   Screening Tests Health Maintenance  Topic Date Due  . INFLUENZA VACCINE  03/31/2017 (Originally 01/29/2017)  . TETANUS/TDAP  07/26/2018  . COLONOSCOPY  03/25/2022  . Hepatitis C Screening  Completed  . PNA vac Low Risk Adult  Completed      Plan:    Bring a copy of your advance directives to your next office visit.  Continue doing brain stimulating activities (puzzles, reading, adult coloring books, staying active) to keep memory sharp.   I have personally reviewed and noted the following in the patient's chart:   . Medical and social history . Use of alcohol, tobacco or illicit drugs  . Current medications and supplements . Functional ability and status . Nutritional status . Physical activity . Advanced directives . List of other physicians . Hospitalizations, surgeries, and ER visits in previous 12 months . Vitals . Screenings to include cognitive, depression, and falls . Referrals and appointments  In addition, I have reviewed and discussed with patient certain preventive protocols, quality metrics, and best practice recommendations. A written personalized care plan for preventive services as well as general preventive health recommendations were provided to patient.     Gerilyn Nestle, RN  12/17/2016  Reviewed documentation and agree w/ above.  [name], MD

## 2016-12-17 NOTE — Assessment & Plan Note (Signed)
Chronic problem.  Check labs.  Replete prn. 

## 2016-12-17 NOTE — Assessment & Plan Note (Signed)
Ongoing issue for pt.  Stressed need for healthy diet and regular exercise.  Check labs to risk stratify.  Will follow 

## 2016-12-17 NOTE — Assessment & Plan Note (Signed)
Check labs to ensure stability of platelets- alcohol is an additional risk factor

## 2016-12-17 NOTE — Assessment & Plan Note (Signed)
Deteriorated.  Pt's BP is elevated even on recheck.  Suspect this is due to his recent stressors and increased alcohol consumption.  Reviewed need to decrease ETOH intake and eat low salt diet.  Check labs.  If no improvement at next visit, will add medication.  Pt expressed understanding and is in agreement w/ plan.

## 2016-12-17 NOTE — Assessment & Plan Note (Signed)
New.  This is the first I have heard about this.  Pt's wife is clearly concerned about his daily and excessive vodka drinking.  Pt wants a prescription to curb his cravings but is not interested in seeing someone to discuss his drinking or help him stop.  If his liver functions are normal, we can start Naltrexone and monitor closely but I stressed that he also needs to join a program like AA.  Will follow closely.

## 2016-12-17 NOTE — Progress Notes (Addendum)
   Subjective:    Patient ID: Timothy Mckinney, male    DOB: [birth year], 66 y.o.   MRN: [id number]  HPI CPE- BP is elevated today.  Reports he is under considerable stress, 'it's just been 1 thing after another'.  Currently on Metoprolol 50mg  daily.  UTD on colonoscopy, UTD on immunizations.  Increased alcohol consumption- wife is concerned.  Pt is drinking daily- when questioned as to how much, pt keeps stating 'it depends'.  Thinks on average 3-5 drinks daily (vodka).  Pt is not willing to see someone about this.  Low back pain- bilateral, no improvement w/ stretching.  Pt is very frustrated.   Review of Systems Patient reports no vision/hearing changes, anorexia, fever ,adenopathy, persistant/recurrent hoarseness, swallowing issues, chest pain, palpitations, edema, persistant/recurrent cough, hemoptysis, dyspnea (rest,exertional, paroxysmal nocturnal), gastrointestinal  bleeding (melena, rectal bleeding), abdominal pain, excessive heart burn, GU symptoms (dysuria, hematuria, voiding/incontinence issues) syncope, focal weakness, memory loss, numbness & tingling, skin/hair/nail changes, depression, anxiety, abnormal bruising/bleeding.     Objective:   Physical Exam General Appearance:    Alert, cooperative, no distress, appears stated age, morbidly obese  Head:    Normocephalic, without obvious abnormality, atraumatic  Eyes:    PERRL, conjunctiva/corneas clear, EOM's intact, fundi    benign, both eyes       Ears:    Normal TM's and external ear canals, both ears  Nose:   Nares normal, septum midline, mucosa normal, no drainage   or sinus tenderness  Throat:   Lips, mucosa, and tongue normal; teeth and gums normal  Neck:   Supple, symmetrical, trachea midline, no adenopathy;       thyroid:  No enlargement/tenderness/nodules  Back:     Symmetric, no curvature, ROM normal, no CVA tenderness  Lungs:     Clear to auscultation bilaterally, respirations unlabored  Chest wall:    No tenderness or  deformity  Heart:    Regular rate and rhythm, S1 and S2 normal, no murmur, rub   or gallop  Abdomen:     Soft, non-tender, bowel sounds active all four quadrants,    no masses, no organomegaly  Genitalia:    Deferred at pt's request  Rectal:    Extremities:   Extremities normal, atraumatic, no cyanosis or edema  Pulses:   2+ and symmetric all extremities  Skin:   Skin color, texture, turgor normal, no rashes or lesions  Lymph nodes:   Cervical, supraclavicular, and axillary nodes normal  Neurologic:   CNII-XII intact. Normal strength, sensation and reflexes      throughout          Assessment & Plan:

## 2016-12-17 NOTE — Assessment & Plan Note (Signed)
Pt's PE WNL w/ exception of morbid obesity.  UTD on colonoscopy, immunizations.  Check labs.  Anticipatory guidance provided.

## 2016-12-19 ENCOUNTER — Telehealth: Payer: Self-pay | Admitting: Family Medicine

## 2016-12-19 NOTE — Telephone Encounter (Signed)
Patient notified of PCP recommendations and is agreement and expresses an understanding.  

## 2016-12-19 NOTE — Telephone Encounter (Signed)
Please advise 

## 2016-12-19 NOTE — Telephone Encounter (Signed)
[facility] has acupuncture available

## 2016-12-19 NOTE — Telephone Encounter (Signed)
Pt calling asking for a call back regarding his AWV that he had with Maudie Mercury, he has some questions for her.   Also pt asking if KT knows of any acupuncturist in the area that she would recommend, pt states that a family had tried this for back pain and it worked, so he wanted to give it a try.

## 2016-12-23 DIAGNOSIS — M5136 Other intervertebral disc degeneration, lumbar region: Secondary | ICD-10-CM | POA: Diagnosis not present

## 2016-12-24 ENCOUNTER — Encounter: Payer: Self-pay | Admitting: Family Medicine

## 2016-12-24 NOTE — Telephone Encounter (Signed)
Spoke with patient, would like to update family history to include his sister's recent diagnosis of Colon Cancer.

## 2016-12-31 DIAGNOSIS — M5136 Other intervertebral disc degeneration, lumbar region: Secondary | ICD-10-CM | POA: Diagnosis not present

## 2017-01-07 ENCOUNTER — Encounter: Payer: Self-pay | Admitting: Family Medicine

## 2017-01-07 ENCOUNTER — Ambulatory Visit (INDEPENDENT_AMBULATORY_CARE_PROVIDER_SITE_OTHER): Payer: PPO | Admitting: Family Medicine

## 2017-01-07 VITALS — BP 138/82 | HR 86 | Resp 17 | Ht 71.0 in | Wt 299.0 lb

## 2017-01-07 DIAGNOSIS — F1010 Alcohol abuse, uncomplicated: Secondary | ICD-10-CM | POA: Diagnosis not present

## 2017-01-07 DIAGNOSIS — M5136 Other intervertebral disc degeneration, lumbar region: Secondary | ICD-10-CM | POA: Diagnosis not present

## 2017-01-07 DIAGNOSIS — I10 Essential (primary) hypertension: Secondary | ICD-10-CM | POA: Diagnosis not present

## 2017-01-07 NOTE — Progress Notes (Signed)
Pre visit review using our clinic review tool, if applicable. No additional management support is needed unless otherwise documented below in the visit note. 

## 2017-01-07 NOTE — Patient Instructions (Signed)
Follow up in 6-8 weeks to recheck BP No med changes at this time Continue to cut back on your alcohol intake- you can do it!!! Call with any questions or concerns Hang in there with the back!!!

## 2017-01-07 NOTE — Assessment & Plan Note (Signed)
Pt is trying to decrease his alcohol consumption.  Applauded his efforts.  Will follow.

## 2017-01-07 NOTE — Assessment & Plan Note (Signed)
Chronic problem.  BP remains mildly elevated but he is in considerable pain w/ his back so this is not surprising.  No medication changes at this time but will continue to follow closely.  Pt expressed understanding and is in agreement w/ plan.

## 2017-01-07 NOTE — Progress Notes (Signed)
   Subjective:    Patient ID: IRISH PIECH, male    DOB: 1950/07/03, 66 y.o.   MRN: [id number]  HPI HTN- chronic problem, BP remains elevated but pt is attributing this to his ongoing back pain.  Wife took BP at home a few times over the last couple of weeks and 'it was normal'- 120/65.  No CP.  No SOB.  Taking Metoprolol once daily.  No swelling of hands/feet.  Pt has decreased ETOH consumption by '20-30%'.   Review of Systems For ROS see HPI     Objective:   Physical Exam  Constitutional: He is oriented to person, place, and time. He appears well-developed and well-nourished. No distress.  obese  HENT:  Head: Normocephalic and atraumatic.  Eyes: Conjunctivae and EOM are normal. Pupils are equal, round, and reactive to light.  Neck: Normal range of motion. Neck supple. No thyromegaly present.  Cardiovascular: Normal rate, regular rhythm, normal heart sounds and intact distal pulses.   No murmur heard. Pulmonary/Chest: Effort normal and breath sounds normal. No respiratory distress.  Abdominal: Soft. Bowel sounds are normal. He exhibits no distension.  Musculoskeletal: He exhibits no edema.  Lymphadenopathy:    He has no cervical adenopathy.  Neurological: He is alert and oriented to person, place, and time. No cranial nerve deficit.  Skin: Skin is warm and dry.  Psychiatric: He has a normal mood and affect. His behavior is normal.  Vitals reviewed.         Assessment & Plan:

## 2017-01-09 DIAGNOSIS — M5136 Other intervertebral disc degeneration, lumbar region: Secondary | ICD-10-CM | POA: Diagnosis not present

## 2017-01-15 DIAGNOSIS — M5136 Other intervertebral disc degeneration, lumbar region: Secondary | ICD-10-CM | POA: Diagnosis not present

## 2017-01-21 DIAGNOSIS — M5136 Other intervertebral disc degeneration, lumbar region: Secondary | ICD-10-CM | POA: Diagnosis not present

## 2017-01-24 DIAGNOSIS — M5136 Other intervertebral disc degeneration, lumbar region: Secondary | ICD-10-CM | POA: Diagnosis not present

## 2017-01-28 DIAGNOSIS — M5136 Other intervertebral disc degeneration, lumbar region: Secondary | ICD-10-CM | POA: Diagnosis not present

## 2017-01-31 DIAGNOSIS — M5136 Other intervertebral disc degeneration, lumbar region: Secondary | ICD-10-CM | POA: Diagnosis not present

## 2017-02-04 DIAGNOSIS — M5136 Other intervertebral disc degeneration, lumbar region: Secondary | ICD-10-CM | POA: Diagnosis not present

## 2017-02-07 DIAGNOSIS — M5136 Other intervertebral disc degeneration, lumbar region: Secondary | ICD-10-CM | POA: Diagnosis not present

## 2017-03-06 ENCOUNTER — Ambulatory Visit (INDEPENDENT_AMBULATORY_CARE_PROVIDER_SITE_OTHER): Payer: PPO | Admitting: Family Medicine

## 2017-03-06 ENCOUNTER — Encounter: Payer: Self-pay | Admitting: Family Medicine

## 2017-03-06 VITALS — BP 124/78 | HR 80 | Temp 98.1°F | Resp 16 | Ht 71.0 in | Wt 299.4 lb

## 2017-03-06 DIAGNOSIS — R0982 Postnasal drip: Secondary | ICD-10-CM | POA: Diagnosis not present

## 2017-03-06 DIAGNOSIS — I10 Essential (primary) hypertension: Secondary | ICD-10-CM

## 2017-03-06 NOTE — Progress Notes (Signed)
Pre visit review using our clinic review tool, if applicable. No additional management support is needed unless otherwise documented below in the visit note. 

## 2017-03-06 NOTE — Patient Instructions (Addendum)
Follow up in December to recheck cholesterol Continue to work on healthy diet and regular exercise No med changes at this time Add the Zyrtec once daily for the nasal drainage Call with any questions or concerns Happy [birth year]ated Birthday!!!

## 2017-03-06 NOTE — Assessment & Plan Note (Signed)
Deteriorated.  Add Zyrtec to nightly Benadryl

## 2017-03-06 NOTE — Assessment & Plan Note (Signed)
Much improved w/ pt's better pain control.  No med changes at this time.  Will continue to follow.

## 2017-03-06 NOTE — Progress Notes (Signed)
   Subjective:    Patient ID: Timothy Mckinney, male    DOB: Jan 03, 1951, 66 y.o.   MRN: [id number]  HPI HTN- chronic problem, BP is well controlled today on Metoprolol after being elevated at last visit.  Pt reports BP has been much better controlled since back pain improved.  He has been doing acupuncture w/ good relief.  Denies CP, SOB, HAs, visual changes, edema.   Review of Systems For ROS see HPI     Objective:   Physical Exam  Constitutional: He is oriented to person, place, and time. He appears well-developed and well-nourished. No distress.  HENT:  Head: Normocephalic and atraumatic.  Eyes: Pupils are equal, round, and reactive to light. Conjunctivae and EOM are normal.  Neck: Normal range of motion. Neck supple. No thyromegaly present.  Cardiovascular: Normal rate, regular rhythm, normal heart sounds and intact distal pulses.   No murmur heard. Pulmonary/Chest: Effort normal and breath sounds normal. No respiratory distress.  Abdominal: Soft. Bowel sounds are normal. He exhibits no distension.  Musculoskeletal: He exhibits no edema.  Lymphadenopathy:    He has no cervical adenopathy.  Neurological: He is alert and oriented to person, place, and time. No cranial nerve deficit.  Skin: Skin is warm and dry.  Psychiatric: He has a normal mood and affect. His behavior is normal.  Vitals reviewed.         Assessment & Plan:

## 2017-03-10 ENCOUNTER — Other Ambulatory Visit: Payer: Self-pay | Admitting: Family Medicine

## 2017-03-10 DIAGNOSIS — I10 Essential (primary) hypertension: Secondary | ICD-10-CM

## 2017-03-11 ENCOUNTER — Other Ambulatory Visit: Payer: Self-pay | Admitting: Gastroenterology

## 2017-03-11 DIAGNOSIS — R159 Full incontinence of feces: Secondary | ICD-10-CM | POA: Diagnosis not present

## 2017-03-11 DIAGNOSIS — R945 Abnormal results of liver function studies: Principal | ICD-10-CM

## 2017-03-11 DIAGNOSIS — R194 Change in bowel habit: Secondary | ICD-10-CM | POA: Diagnosis not present

## 2017-03-11 DIAGNOSIS — D696 Thrombocytopenia, unspecified: Secondary | ICD-10-CM | POA: Diagnosis not present

## 2017-03-11 DIAGNOSIS — R7989 Other specified abnormal findings of blood chemistry: Secondary | ICD-10-CM

## 2017-03-11 DIAGNOSIS — Z1211 Encounter for screening for malignant neoplasm of colon: Secondary | ICD-10-CM | POA: Diagnosis not present

## 2017-03-11 DIAGNOSIS — Z86010 Personal history of colonic polyps: Secondary | ICD-10-CM | POA: Diagnosis not present

## 2017-03-11 DIAGNOSIS — Z800 Family history of malignant neoplasm of digestive organs: Secondary | ICD-10-CM | POA: Diagnosis not present

## 2017-03-11 NOTE — Progress Notes (Signed)
Timothy Alcocer MD 

## 2017-03-24 DIAGNOSIS — Z1211 Encounter for screening for malignant neoplasm of colon: Secondary | ICD-10-CM | POA: Diagnosis not present

## 2017-03-24 DIAGNOSIS — Z800 Family history of malignant neoplasm of digestive organs: Secondary | ICD-10-CM | POA: Diagnosis not present

## 2017-03-24 DIAGNOSIS — K635 Polyp of colon: Secondary | ICD-10-CM | POA: Diagnosis not present

## 2017-03-24 DIAGNOSIS — D120 Benign neoplasm of cecum: Secondary | ICD-10-CM | POA: Diagnosis not present

## 2017-03-24 DIAGNOSIS — D122 Benign neoplasm of ascending colon: Secondary | ICD-10-CM | POA: Diagnosis not present

## 2017-03-24 DIAGNOSIS — D124 Benign neoplasm of descending colon: Secondary | ICD-10-CM | POA: Diagnosis not present

## 2017-03-30 NOTE — Assessment & Plan Note (Signed)
New.  Reviewed dx and tx.  Pt to start daily Zyrtec.  Pt expressed understanding and is in agreement w/ plan.

## 2017-03-30 NOTE — Assessment & Plan Note (Signed)
Chronic problem.  Adequate but not ideal control.  Asymptomatic.  Plans to start exercising.  Check labs.  No anticipated med changes.  Will follow.

## 2017-03-30 NOTE — Assessment & Plan Note (Signed)
Chronic problem.  Pt reports that since starting Prozac he has noticed a tremor of his hands.  He is interested in weaning off this medication as he is not sure if this is the cause.  Reviewed weaning instructions for him as well as the need to notify me if the tremor doesn't improve once the medication is out of his system.  Pt expressed understanding and is in agreement w/ plan.

## 2017-04-03 ENCOUNTER — Telehealth: Payer: Self-pay | Admitting: General Practice

## 2017-04-03 ENCOUNTER — Other Ambulatory Visit (INDEPENDENT_AMBULATORY_CARE_PROVIDER_SITE_OTHER): Payer: PPO

## 2017-04-03 DIAGNOSIS — D72819 Decreased white blood cell count, unspecified: Secondary | ICD-10-CM | POA: Diagnosis not present

## 2017-04-03 DIAGNOSIS — D696 Thrombocytopenia, unspecified: Secondary | ICD-10-CM | POA: Diagnosis not present

## 2017-04-03 LAB — CBC WITH DIFFERENTIAL/PLATELET
Basophils Absolute: 0 10*3/uL (ref 0.0–0.1)
Basophils Relative: 0.8 % (ref 0.0–3.0)
EOS PCT: 3.8 % (ref 0.0–5.0)
Eosinophils Absolute: 0.1 10*3/uL (ref 0.0–0.7)
HEMATOCRIT: 39.1 % (ref 39.0–52.0)
HEMOGLOBIN: 13.3 g/dL (ref 13.0–17.0)
LYMPHS PCT: 27.6 % (ref 12.0–46.0)
Lymphs Abs: 0.8 10*3/uL (ref 0.7–4.0)
MCHC: 34 g/dL (ref 30.0–36.0)
MCV: 108.5 fl — ABNORMAL HIGH (ref 78.0–100.0)
MONOS PCT: 8.3 % (ref 3.0–12.0)
Monocytes Absolute: 0.2 10*3/uL (ref 0.1–1.0)
Neutro Abs: 1.7 10*3/uL (ref 1.4–7.7)
Neutrophils Relative %: 59.5 % (ref 43.0–77.0)
Platelets: 76 10*3/uL — ABNORMAL LOW (ref 150.0–400.0)
RBC: 3.6 Mil/uL — ABNORMAL LOW (ref 4.22–5.81)
RDW: 14 % (ref 11.5–15.5)
WBC: 2.9 10*3/uL — AB (ref 4.0–10.5)

## 2017-04-03 LAB — PROTIME-INR
INR: 1.4 ratio — ABNORMAL HIGH (ref 0.8–1.0)
Prothrombin Time: 15 s — ABNORMAL HIGH (ref 9.6–13.1)

## 2017-04-03 NOTE — Telephone Encounter (Signed)
Pt made aware and scheduled for lab visit at 1:15 at [facility] lab.

## 2017-04-03 NOTE — Telephone Encounter (Signed)
Spoke with Hinton Dyer, she said that she had spoken with Levada Dy last week and received lab results/office notes on the patient. Hinton Dyer wanted our office to go ahead and schedule the PT/INR labs as discussed with Levada Dy.   I advised Hinton Dyer that there was no record of the conversation in our system and that I would have to obtain approval from our providers here before I could schedule pt for a lab draw. Hinton Dyer stated an understanding.   She said that Dr. Buelah Manis would be interested in a PT/INR lab draw and a CBC w/diff before they can perform any oral surgery on the patient. She advised that Dr. Buelah Manis was concerned how the pt will be able to clot since the last labs they were sent from June showed low WBC's and platelets.   Please advise if I am able to order these labs and schedule pt?

## 2017-04-03 NOTE — Telephone Encounter (Signed)
I am fine with you obtaining labs. I will defer to PCP and Oral surgeon on whether or not they want to proceed with surgery based on results. Seems he has chronic history of thrombocytopenia and leukopenia.

## 2017-04-04 ENCOUNTER — Other Ambulatory Visit: Payer: Self-pay | Admitting: Family Medicine

## 2017-04-04 DIAGNOSIS — D696 Thrombocytopenia, unspecified: Secondary | ICD-10-CM

## 2017-04-04 NOTE — Telephone Encounter (Signed)
Lab results faxed to [facility] and she is contacting pt in regards to his dental procedure.

## 2017-05-06 DIAGNOSIS — H524 Presbyopia: Secondary | ICD-10-CM | POA: Diagnosis not present

## 2017-05-06 DIAGNOSIS — H5203 Hypermetropia, bilateral: Secondary | ICD-10-CM | POA: Diagnosis not present

## 2017-05-06 DIAGNOSIS — H52223 Regular astigmatism, bilateral: Secondary | ICD-10-CM | POA: Diagnosis not present

## 2017-05-15 ENCOUNTER — Telehealth: Payer: Self-pay

## 2017-05-15 ENCOUNTER — Encounter: Payer: Self-pay | Admitting: Hematology and Oncology

## 2017-05-15 ENCOUNTER — Ambulatory Visit (HOSPITAL_BASED_OUTPATIENT_CLINIC_OR_DEPARTMENT_OTHER): Payer: PPO

## 2017-05-15 ENCOUNTER — Ambulatory Visit (HOSPITAL_BASED_OUTPATIENT_CLINIC_OR_DEPARTMENT_OTHER): Payer: PPO | Admitting: Hematology and Oncology

## 2017-05-15 ENCOUNTER — Other Ambulatory Visit: Payer: Self-pay

## 2017-05-15 VITALS — BP 158/63 | HR 72 | Temp 98.9°F | Resp 18 | Ht 71.0 in | Wt 293.0 lb

## 2017-05-15 DIAGNOSIS — D696 Thrombocytopenia, unspecified: Secondary | ICD-10-CM

## 2017-05-15 DIAGNOSIS — D7589 Other specified diseases of blood and blood-forming organs: Secondary | ICD-10-CM | POA: Diagnosis not present

## 2017-05-15 DIAGNOSIS — D72819 Decreased white blood cell count, unspecified: Secondary | ICD-10-CM

## 2017-05-15 LAB — COMPREHENSIVE METABOLIC PANEL
ALBUMIN: 3.2 g/dL — AB (ref 3.5–5.0)
ALK PHOS: 130 U/L (ref 40–150)
ALT: 30 U/L (ref 0–55)
ANION GAP: 7 meq/L (ref 3–11)
AST: 97 U/L — ABNORMAL HIGH (ref 5–34)
BILIRUBIN TOTAL: 3.35 mg/dL — AB (ref 0.20–1.20)
BUN: 11.5 mg/dL (ref 7.0–26.0)
CO2: 28 mEq/L (ref 22–29)
Calcium: 8.9 mg/dL (ref 8.4–10.4)
Chloride: 100 mEq/L (ref 98–109)
Creatinine: 0.8 mg/dL (ref 0.7–1.3)
Glucose: 115 mg/dl (ref 70–140)
POTASSIUM: 4.3 meq/L (ref 3.5–5.1)
Sodium: 135 mEq/L — ABNORMAL LOW (ref 136–145)
TOTAL PROTEIN: 7.1 g/dL (ref 6.4–8.3)

## 2017-05-15 LAB — CBC & DIFF AND RETIC
BASO%: 0.3 % (ref 0.0–2.0)
Basophils Absolute: 0 10*3/uL (ref 0.0–0.1)
EOS%: 3.2 % (ref 0.0–7.0)
Eosinophils Absolute: 0.1 10*3/uL (ref 0.0–0.5)
HEMATOCRIT: 40.9 % (ref 38.4–49.9)
HEMOGLOBIN: 13.8 g/dL (ref 13.0–17.1)
Immature Retic Fract: 6.4 % (ref 3.00–10.60)
LYMPH#: 0.9 10*3/uL (ref 0.9–3.3)
LYMPH%: 24.4 % (ref 14.0–49.0)
MCH: 36.4 pg — AB (ref 27.2–33.4)
MCHC: 33.7 g/dL (ref 32.0–36.0)
MCV: 107.9 fL — ABNORMAL HIGH (ref 79.3–98.0)
MONO#: 0.3 10*3/uL (ref 0.1–0.9)
MONO%: 8.8 % (ref 0.0–14.0)
NEUT%: 63.3 % (ref 39.0–75.0)
NEUTROS ABS: 2.4 10*3/uL (ref 1.5–6.5)
Platelets: 75 10*3/uL — ABNORMAL LOW (ref 140–400)
RBC: 3.79 10*6/uL — ABNORMAL LOW (ref 4.20–5.82)
RDW: 14.1 % (ref 11.0–14.6)
RETIC %: 3.04 % — AB (ref 0.80–1.80)
RETIC CT ABS: 115.22 10*3/uL — AB (ref 34.80–93.90)
WBC: 3.8 10*3/uL — AB (ref 4.0–10.3)

## 2017-05-15 LAB — MORPHOLOGY: PLT EST: DECREASED

## 2017-05-15 LAB — LACTATE DEHYDROGENASE: LDH: 228 U/L (ref 125–245)

## 2017-05-15 NOTE — Telephone Encounter (Signed)
Printed avs and calender for upcoming appointment. Per 11/15 los 

## 2017-05-15 NOTE — Telephone Encounter (Signed)
Added patient on for lab. They will print avs and calender. Per 11/15 los

## 2017-05-16 LAB — HOMOCYSTEINE: Homocysteine: 13 umol/L (ref 0.0–15.0)

## 2017-05-16 LAB — FOLATE: FOLATE: 18.1 ng/mL (ref >3.0)

## 2017-05-16 LAB — VITAMIN B12: Vitamin B12: 628 pg/mL (ref 232–1245)

## 2017-05-17 LAB — METHYLMALONIC ACID, SERUM: METHYLMALONIC ACID: 158 nmol/L (ref 0–378)

## 2017-05-18 LAB — VITAMIN B6: Vitamin B6: 1.8 ug/L — ABNORMAL LOW (ref 5.3–46.7)

## 2017-05-18 LAB — VITAMIN B1: Thiamine: 107.8 nmol/L (ref 66.5–200.0)

## 2017-05-20 ENCOUNTER — Ambulatory Visit (HOSPITAL_COMMUNITY)
Admission: RE | Admit: 2017-05-20 | Discharge: 2017-05-20 | Disposition: A | Discharge status: Home / Self Care | Payer: PPO | Source: Ambulatory Visit | Attending: Hematology and Oncology | Admitting: Hematology and Oncology

## 2017-05-20 DIAGNOSIS — D72819 Decreased white blood cell count, unspecified: Secondary | ICD-10-CM

## 2017-05-20 DIAGNOSIS — R932 Abnormal findings on diagnostic imaging of liver and biliary tract: Secondary | ICD-10-CM | POA: Diagnosis not present

## 2017-05-20 DIAGNOSIS — R161 Splenomegaly, not elsewhere classified: Secondary | ICD-10-CM | POA: Diagnosis not present

## 2017-05-20 DIAGNOSIS — D696 Thrombocytopenia, unspecified: Secondary | ICD-10-CM | POA: Diagnosis not present

## 2017-05-20 DIAGNOSIS — D7589 Other specified diseases of blood and blood-forming organs: Secondary | ICD-10-CM

## 2017-05-20 DIAGNOSIS — K760 Fatty (change of) liver, not elsewhere classified: Secondary | ICD-10-CM | POA: Diagnosis not present

## 2017-05-23 ENCOUNTER — Other Ambulatory Visit: Payer: Self-pay | Admitting: Radiology

## 2017-05-28 ENCOUNTER — Other Ambulatory Visit: Payer: Self-pay | Admitting: Student

## 2017-05-29 ENCOUNTER — Ambulatory Visit (HOSPITAL_COMMUNITY): Payer: PPO

## 2017-06-03 ENCOUNTER — Telehealth: Payer: Self-pay | Admitting: Family Medicine

## 2017-06-03 ENCOUNTER — Encounter: Payer: Self-pay | Admitting: Hematology and Oncology

## 2017-06-03 NOTE — Assessment & Plan Note (Signed)
66 y.o.-year-old male with history of alcohol abuse, and possible ongoing alcohol abuse referred to our clinic for thrombocytopenia.  Review of hematological profile reveals macrocytic progressive anemia, progressive thrombocytopenia and leukopenia.  Differential diagnosis includes hepatic cirrhosis development with concurrent bone marrow suppression due to liver disease, portal hypertension leading to splenomegaly and thrombocytopenia.  Alternative diagnosis includes primary hematological abnormalities such as myelodysplastic syndrome, myeloproliferative, or lymphoproliferative process.  Plan: --Repeat labs as outlined below --Ultrasound of spleen and liver --Due to multilineage normalities, proceed with bone marrow biopsy --Return to clinic 1 week after bone marrow biopsy to review the findings.

## 2017-06-03 NOTE — Progress Notes (Signed)
[facility] New Visit:  Assessment: Thrombocytopenia ([place]) 66 y.o.-year-old male with history of alcohol abuse, and possible ongoing alcohol abuse referred to our clinic for thrombocytopenia.  Review of hematological profile reveals macrocytic progressive anemia, progressive thrombocytopenia and leukopenia.  Differential diagnosis includes hepatic cirrhosis development with concurrent bone marrow suppression due to liver disease, portal hypertension leading to splenomegaly and thrombocytopenia.  Alternative diagnosis includes primary hematological abnormalities such as myelodysplastic syndrome, myeloproliferative, or lymphoproliferative process.  Plan: --Repeat labs as outlined below --Ultrasound of spleen and liver --Due to multilineage normalities, proceed with bone marrow biopsy --Return to clinic 1 week after bone marrow biopsy to review the findings.  Voice recognition software was used and creation of this note. Despite my best effort at editing the text, some misspelling/errors may have occurred.  Orders Placed This Encounter  Procedures  . US Abdomen Complete    Standing Status:   Future    Number of Occurrences:   1    Standing Expiration Date:   05/15/2018    Order Specific Question:   Reason for Exam (SYMPTOM  OR DIAGNOSIS REQUIRED)    Answer:   Pancytopenia, EtOH abuse history -- please eval for splenomegaly and evidence of hepatic cirrhosis    Order Specific Question:   Preferred imaging location?    Answer:   [facility]  . CT Biopsy    Pait, April H  Bethea, Alicia J    pls rs with the pt. Thanks.       Standing Status:   Future    Standing Expiration Date:   05/15/2018    Order Specific Question:   Lab orders requested (DO NOT place separate lab orders, these will be automatically ordered during procedure specimen collection):    Answer:   Oligo clonal bands    Comments:   Pathology, flow cytometry, cytogenetics and MDS FISH     Order Specific Question:   Lab orders requested (DO NOT place separate lab orders, these will be automatically ordered during procedure specimen collection):    Answer:   Cytology - Non Pap    Order Specific Question:   Lab orders requested (DO NOT place separate lab orders, these will be automatically ordered during procedure specimen collection):    Answer:   Surgical Pathology    Order Specific Question:   Lab orders requested (DO NOT place separate lab orders, these will be automatically ordered during procedure specimen collection):    Answer:   Other    Order Specific Question:   Reason for Exam (SYMPTOM  OR DIAGNOSIS REQUIRED)    Answer:   Developing pancytopenia, please eval for poss MDS    Order Specific Question:   Preferred imaging location?    Answer:   [facility]    Order Specific Question:   Radiology Contrast Protocol - do NOT remove file path    Answer:   [email or web address]  . CT BONE MARROW BIOPSY & ASPIRATION    Standing Status:   Future    Standing Expiration Date:   08/15/2018    Order Specific Question:   Reason for Exam (SYMPTOM  OR DIAGNOSIS REQUIRED)    Answer:   Developing pancytopenia, please eval for poss MDS    Order Specific Question:   Preferred imaging location?    Answer:   [facility]    Order Specific Question:   Radiology Contrast Protocol - do NOT remove file path    Answer:   [email or web address]  .  CBC & Diff and Retic    Standing Status:   Future    Number of Occurrences:   1    Standing Expiration Date:   05/15/2018  . Morphology    Standing Status:   Future    Number of Occurrences:   1    Standing Expiration Date:   05/15/2018  . Comprehensive metabolic panel    Standing Status:   Future    Number of Occurrences:   1    Standing Expiration Date:   05/15/2018  . Lactate dehydrogenase (LDH)    Standing Status:   Future    Number of Occurrences:   1    Standing  Expiration Date:   05/15/2018  . Folate, Serum    Standing Status:   Future    Number of Occurrences:   1    Standing Expiration Date:   05/15/2018  . Vitamin B12    Standing Status:   Future    Number of Occurrences:   1    Standing Expiration Date:   05/15/2018  . Vitamin B6    Standing Status:   Future    Number of Occurrences:   1    Standing Expiration Date:   05/15/2018  . Vitamin B1    Standing Status:   Future    Number of Occurrences:   1    Standing Expiration Date:   05/15/2018  . Methylmalonic acid, serum    Standing Status:   Future    Number of Occurrences:   1    Standing Expiration Date:   05/15/2018  . Homocysteine, serum    Standing Status:   Future    Number of Occurrences:   1    Standing Expiration Date:   05/15/2018    All questions were answered.  . The patient knows to call the clinic with any problems, questions or concerns.  This note was electronically signed.    History of Presenting Illness Timothy Mckinney 66 y.o. presenting to the [place] for thrombocytopenia evaluation, referred by Dr [name].  Review of past medical history significant for hypertension and alcohol abuse.  Currently, patient reports still drinking 1-2 drinks intermittently.  Denies daily alcohol use.  Hematological trends reflected below.  Currently, patient denies any nausea, vomiting, fevers, chills, night sweats.  Denies any abdominal pain or early satiety.  No diarrhea or constipation.  No abdominal bloating.  Denies any chest pain, shortness of breath, or cough.  No easy bruisability, gum bleeding, epistaxis, hemoptysis, hematemesis, hematuria, hematochezia, or melena.  Oncological/hematological History: --Labs, 12/17/16: WBC 3.3, Hgb 14.2, MCV 107.7, MCH ..., RDW     ..., Plt 91; TSH 2.89, Folate 4.0, Vit B12 460; Elevated AST, tBili --Labs, 04/03/17: WBC 2.9, Hgb 13.3, MCV 108.5, MCH ..., RDW 14.0, Plt 76  Medical History: Past Medical History:  Diagnosis  Date  . Abnormal chest x-ray    ? granulomata  . Arthritis    knees and back   . DDD (degenerative disc disease), lumbar    MRI 08/2005  . Dry eyes, bilateral    Dr Bing Plume  . Elevated liver enzymes    HISTORY OF ELEVATED LIVER ENZYMES  . GERD (gastroesophageal reflux disease)    hx of   . Hypertension    NEW DIAGNOSIS AND STARTED ON METOPROLOL ON 10/04/11  . Hyperuricemia   . 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 Nephrolithiasis    Dr Serita Butcher  . Sleep apnea  STOP BANG SCORE OF 4  . Thrombocytopenia ([place])    HX OF LOW PLATELET COUNT    Surgical History: Past Surgical History:  Procedure Laterality Date  . ANAL FISTULECTOMY  06/01/2012   Procedure: FISTULECTOMY ANAL;  Surgeon: Leighton Ruff, MD;  Location: [facility];  Service: General;  Laterality: N/A;  Mucosal Advancement Flap   . CARDIAC CATHETERIZATION     in 40s, negative  . colon ulcer  2011  . COLONOSCOPY W/ POLYPECTOMY  2011   Dr Sharlett Iles  . KNEE ARTHROSCOPY  2009    L  . KNEE SURGERY     april 2013 total knee replacement removed  . LITHOTRIPSY    . tear duct plugs    . TOTAL KNEE ARTHROPLASTY  2011   Workman's Comp case, Dr Veverly Fells    Family History: Family History  Problem Relation Age of Onset  . Heart attack Father 51  . Colon polyps Mother   . Dementia Mother        died post SDH  . Cancer Paternal Grandfather        throat , pipe smoker  . Colon polyps Sister   . Cancer Sister        Colon, stage 2  . Stroke Neg Hx     Social History: Social History   Socioeconomic History  . Marital status: Married    Spouse name: Not on file  . Number of children: Not on file  . Years of education: Not on file  . Highest education level: Not on file  Social Needs  . Financial resource strain: Not on file  . Food insecurity - worry: Not on file  . Food insecurity - inability: Not on file  . Transportation needs - medical: Not on file  . Transportation needs - non-medical: Not on file   Occupational History  . Not on file  Tobacco Use  . Smoking status: Never Smoker  . Smokeless tobacco: Never Used  Substance and Sexual Activity  . Alcohol use: Yes    Alcohol/week: 16.8 oz    Types: 14 Standard drinks or equivalent, 4 Cans of beer, 10 Shots of liquor per week    Comment: 4 beer and 10 liquor glasses per week  . Drug use: No  . Sexual activity: Not on file  Other Topics Concern  . Not on file  Social History Narrative  . Not on file    Allergies: Allergies  Allergen Reactions  . Penicillins Swelling    Diffuse swelling    Medications:  Current Outpatient Medications  Medication Sig Dispense Refill  . cycloSPORINE (RESTASIS) 0.05 % ophthalmic emulsion Place 1 drop into both eyes daily.    . diphenhydrAMINE (BENADRYL) 25 MG tablet Take 25 mg by mouth. ONE AT BEDTIME IF NEEDED SLEEP    . FOLIC ACID PO Take by mouth.    . Loperamide HCl (IMODIUM PO) Take by mouth daily.    . metoprolol succinate (TOPROL-XL) 50 MG 24 hr tablet take 1 tablet by mouth once daily 90 tablet 1  . sildenafil (REVATIO) 20 MG tablet TAKE 2-5 TABLETS BY MOUTH 1 HOUR BEFORE AS NEEDED  11  . Flaxseed, Linseed, (FLAXSEED OIL MAX STR) 1300 MG CAPS Take 2 capsules by mouth daily.    . Simethicone (GAS-X PO) Take by mouth daily.     No current facility-administered medications for this visit.     Review of Systems: Review of Systems  All other systems reviewed and are negative.  PHYSICAL EXAMINATION Blood pressure (!) 158/63, pulse 72, temperature 98.9 F (37.2 C), temperature source Oral, resp. rate 18, height '5\' 11"'  (1.803 m), weight 293 lb (132.9 kg), SpO2 98 %.  ECOG PERFORMANCE STATUS: 1 - Symptomatic but completely ambulatory  Physical Exam  Constitutional: He is oriented to person, place, and time and well-developed, well-nourished, and in no distress. No distress.  HENT:  Head: Normocephalic and atraumatic.  Mouth/Throat: Oropharynx is clear and moist. No oropharyngeal  exudate.  Eyes: Conjunctivae and EOM are normal. Pupils are equal, round, and reactive to light. No scleral icterus.  Neck: No thyromegaly present.  Cardiovascular: Normal rate, regular rhythm and normal heart sounds.  No murmur heard. Pulmonary/Chest: Effort normal and breath sounds normal. No respiratory distress. He has no wheezes. He has no rales. He exhibits no tenderness.  Abdominal: Soft. Bowel sounds are normal. He exhibits no distension. There is no tenderness. There is no rebound and no guarding.  Musculoskeletal: He exhibits no edema.  Lymphadenopathy:    He has no cervical adenopathy.  Neurological: He is alert and oriented to person, place, and time. He has normal reflexes. No cranial nerve deficit.  Skin: Skin is warm. No rash noted. He is not diaphoretic. No erythema. No pallor.     LABORATORY DATA: I have personally reviewed the data as listed: Appointment on 05/15/2017  Component Date Value Ref Range Status  . Homocysteine 05/15/2017 13.0  0.0 - 15.0 umol/L Final  . Methylmalonic Acid, Serum 05/15/2017 158  0 - 378 nmol/L Final  . Disclaimer: 05/15/2017 Comment   Final   Comment: This test was developed and its performance characteristics determined by [facility]. It has not been cleared or approved by the Food and Drug Administration.   . Thiamine 05/15/2017 107.8  66.5 - 200.0 nmol/L Final   Comment: This test was developed and its performance characteristics determined by [facility]. It has not been cleared or approved by the Food and Drug Administration.   . Vitamin B6 05/15/2017 1.8* 5.3 - 46.7 ug/L Final   Comment: This test was developed and its performance characteristics determined by [facility]. It has not been cleared or approved by the Food and Drug Administration.   . Vitamin B12 05/15/2017 628  232 - 1,245 pg/mL Final  . Folate 05/15/2017 18.1  >3.0 ng/mL Final   Comment: A serum folate concentration of less than 3.1 ng/mL is considered to represent  clinical deficiency.   Marland Kitchen LDH 05/15/2017 228  125 - 245 U/L Final  . Sodium 05/15/2017 135* 136 - 145 mEq/L Final  . Potassium 05/15/2017 4.3  3.5 - 5.1 mEq/L Final  . Chloride 05/15/2017 100  98 - 109 mEq/L Final  . CO2 05/15/2017 28  22 - 29 mEq/L Final  . Glucose 05/15/2017 115  70 - 140 mg/dl Final   Glucose reference range is for nonfasting patients. Fasting glucose reference range is 70- 100.  Marland Kitchen BUN 05/15/2017 11.5  7.0 - 26.0 mg/dL Final  . Creatinine 05/15/2017 0.8  0.7 - 1.3 mg/dL Final  . Total Bilirubin 05/15/2017 3.35* 0.20 - 1.20 mg/dL Final  . Alkaline Phosphatase 05/15/2017 130  40 - 150 U/L Final  . AST 05/15/2017 97* 5 - 34 U/L Final  . ALT 05/15/2017 30  0 - 55 U/L Final  . Total Protein 05/15/2017 7.1  6.4 - 8.3 g/dL Final  . Albumin 05/15/2017 3.2* 3.5 - 5.0 g/dL Final  . Calcium 05/15/2017 8.9  8.4 - 10.4 mg/dL Final  .  Anion Gap 05/15/2017 7  3 - 11 mEq/L Final  . EGFR 05/15/2017 >60  >60 ml/min/1.73 m2 Final   eGFR is calculated using the CKD-EPI Creatinine Equation (2009)  . WBC 05/15/2017 3.8* 4.0 - 10.3 10e3/uL Final  . NEUT# 05/15/2017 2.4  1.5 - 6.5 10e3/uL Final  . HGB 05/15/2017 13.8  13.0 - 17.1 g/dL Final  . HCT 05/15/2017 40.9  38.4 - 49.9 % Final  . Platelets 05/15/2017 75* 140 - 400 10e3/uL Final  . MCV 05/15/2017 107.9* 79.3 - 98.0 fL Final  . MCH 05/15/2017 36.4* 27.2 - 33.4 pg Final  . MCHC 05/15/2017 33.7  32.0 - 36.0 g/dL Final  . RBC 05/15/2017 3.79* 4.20 - 5.82 10e6/uL Final  . RDW 05/15/2017 14.1  11.0 - 14.6 % Final  . lymph# 05/15/2017 0.9  0.9 - 3.3 10e3/uL Final  . MONO# 05/15/2017 0.3  0.1 - 0.9 10e3/uL Final  . Eosinophils Absolute 05/15/2017 0.1  0.0 - 0.5 10e3/uL Final  . Basophils Absolute 05/15/2017 0.0  0.0 - 0.1 10e3/uL Final  . NEUT% 05/15/2017 63.3  39.0 - 75.0 % Final  . LYMPH% 05/15/2017 24.4  14.0 - 49.0 % Final  . MONO% 05/15/2017 8.8  0.0 - 14.0 % Final  . EOS% 05/15/2017 3.2  0.0 - 7.0 % Final  . BASO% 05/15/2017 0.3   0.0 - 2.0 % Final  . Retic % 05/15/2017 3.04* 0.80 - 1.80 % Final  . Retic Ct Abs 05/15/2017 115.22* 34.80 - 93.90 10e3/uL Final  . Immature Retic Fract 05/15/2017 6.40  3.00 - 10.60 % Final  . Polychromasia 05/15/2017 Slight  Slight Final  . White Cell Comments 05/15/2017 C/W auto diff   Final  . Other Comments 05/15/2017 Oc variant lymph   Final  . PLT EST 05/15/2017 Decreased  Adequate Final         Ardath Sax, MD

## 2017-06-03 NOTE — Telephone Encounter (Signed)
Copied from [place]. Topic: Quick Communication - See Telephone Encounter >> Jun 03, 2017  1:30 PM Robina Ade, Helene Kelp D wrote: CRM for notification. See Telephone encounter for: 06/03/17. Patient would like to talk to Dr. Birdie Riddle or her CMA about his lab results and would like a second opinion about having a biopsy. Please call patient back, thanks.

## 2017-06-04 NOTE — Telephone Encounter (Signed)
After reviewing his labs and the oncology note, I would recommend proceeding with the plan as they have outlined- imaging of liver and spleen and then bone marrow biopsy.  I hope this helps with his next steps.

## 2017-06-04 NOTE — Telephone Encounter (Signed)
Patient notified of PCP recommendations and is agreement and expresses an understanding.   Ok for PEC to Discuss results / PCP recommendations / Schedule patient.   

## 2017-06-04 NOTE — Telephone Encounter (Signed)
Please advise 

## 2017-06-09 ENCOUNTER — Other Ambulatory Visit: Payer: Self-pay | Admitting: Radiology

## 2017-06-10 ENCOUNTER — Ambulatory Visit: Payer: PPO | Admitting: Family Medicine

## 2017-06-12 ENCOUNTER — Ambulatory Visit (HOSPITAL_COMMUNITY)
Admission: RE | Admit: 2017-06-12 | Discharge: 2017-06-12 | Disposition: A | Discharge status: Home / Self Care | Payer: PPO | Source: Ambulatory Visit | Attending: Hematology and Oncology | Admitting: Hematology and Oncology

## 2017-06-12 ENCOUNTER — Encounter (HOSPITAL_COMMUNITY): Payer: Self-pay

## 2017-06-12 DIAGNOSIS — D7589 Other specified diseases of blood and blood-forming organs: Secondary | ICD-10-CM | POA: Diagnosis not present

## 2017-06-12 DIAGNOSIS — Z96652 Presence of left artificial knee joint: Secondary | ICD-10-CM | POA: Diagnosis not present

## 2017-06-12 DIAGNOSIS — I10 Essential (primary) hypertension: Secondary | ICD-10-CM | POA: Insufficient documentation

## 2017-06-12 DIAGNOSIS — D696 Thrombocytopenia, unspecified: Secondary | ICD-10-CM | POA: Diagnosis not present

## 2017-06-12 DIAGNOSIS — R161 Splenomegaly, not elsewhere classified: Secondary | ICD-10-CM | POA: Diagnosis not present

## 2017-06-12 DIAGNOSIS — G4730 Sleep apnea, unspecified: Secondary | ICD-10-CM | POA: Diagnosis not present

## 2017-06-12 DIAGNOSIS — D72819 Decreased white blood cell count, unspecified: Secondary | ICD-10-CM | POA: Diagnosis not present

## 2017-06-12 DIAGNOSIS — M5136 Other intervertebral disc degeneration, lumbar region: Secondary | ICD-10-CM | POA: Insufficient documentation

## 2017-06-12 DIAGNOSIS — Z8371 Family history of colonic polyps: Secondary | ICD-10-CM | POA: Insufficient documentation

## 2017-06-12 DIAGNOSIS — F1010 Alcohol abuse, uncomplicated: Secondary | ICD-10-CM | POA: Insufficient documentation

## 2017-06-12 DIAGNOSIS — K219 Gastro-esophageal reflux disease without esophagitis: Secondary | ICD-10-CM | POA: Insufficient documentation

## 2017-06-12 DIAGNOSIS — Z880 Allergy status to penicillin: Secondary | ICD-10-CM | POA: Insufficient documentation

## 2017-06-12 DIAGNOSIS — Z79899 Other long term (current) drug therapy: Secondary | ICD-10-CM | POA: Insufficient documentation

## 2017-06-12 DIAGNOSIS — D61818 Other pancytopenia: Secondary | ICD-10-CM | POA: Insufficient documentation

## 2017-06-12 LAB — CBC WITH DIFFERENTIAL/PLATELET
BASOS PCT: 1 %
Basophils Absolute: 0 10*3/uL (ref 0.0–0.1)
Eosinophils Absolute: 0.2 10*3/uL (ref 0.0–0.7)
Eosinophils Relative: 4 %
HEMATOCRIT: 37.7 % — AB (ref 39.0–52.0)
HEMOGLOBIN: 12.9 g/dL — AB (ref 13.0–17.0)
Lymphocytes Relative: 24 %
Lymphs Abs: 0.8 10*3/uL (ref 0.7–4.0)
MCH: 36.5 pg — ABNORMAL HIGH (ref 26.0–34.0)
MCHC: 34.2 g/dL (ref 30.0–36.0)
MCV: 106.8 fL — ABNORMAL HIGH (ref 78.0–100.0)
MONOS PCT: 9 %
Monocytes Absolute: 0.3 10*3/uL (ref 0.1–1.0)
NEUTROS ABS: 2.1 10*3/uL (ref 1.7–7.7)
NEUTROS PCT: 62 %
Platelets: 75 10*3/uL — ABNORMAL LOW (ref 150–400)
RBC: 3.53 MIL/uL — AB (ref 4.22–5.81)
RDW: 14.6 % (ref 11.5–15.5)
WBC: 3.4 10*3/uL — ABNORMAL LOW (ref 4.0–10.5)

## 2017-06-12 LAB — PROTIME-INR
INR: 1.37
Prothrombin Time: 16.8 seconds — ABNORMAL HIGH (ref 11.4–15.2)

## 2017-06-12 MED ORDER — HYDROCODONE-ACETAMINOPHEN 5-325 MG PO TABS: 1.0000 | ORAL_TABLET | ORAL | Status: DC | PRN

## 2017-06-12 MED ORDER — LIDOCAINE HCL (PF) 1 % IJ SOLN
INTRAMUSCULAR | Status: AC | PRN
  Administered 2017-06-12: 20 mL
  Administered 2017-06-12: 5 mL

## 2017-06-12 MED ORDER — MIDAZOLAM HCL 2 MG/2ML IJ SOLN
INTRAMUSCULAR | Status: AC
  Filled 2017-06-12: qty 4

## 2017-06-12 MED ORDER — FENTANYL CITRATE (PF) 100 MCG/2ML IJ SOLN
INTRAMUSCULAR | Status: AC | PRN
  Administered 2017-06-12 (×2): 50 ug via INTRAVENOUS

## 2017-06-12 MED ORDER — SODIUM CHLORIDE 0.9 % IV SOLN
INTRAVENOUS | Status: DC
  Administered 2017-06-12: 10:00:00 via INTRAVENOUS

## 2017-06-12 MED ORDER — FENTANYL CITRATE (PF) 100 MCG/2ML IJ SOLN
INTRAMUSCULAR | Status: AC
  Filled 2017-06-12: qty 2

## 2017-06-12 MED ORDER — MIDAZOLAM HCL 2 MG/2ML IJ SOLN
INTRAMUSCULAR | Status: AC | PRN
  Administered 2017-06-12 (×3): 1 mg via INTRAVENOUS

## 2017-06-12 NOTE — Discharge Instructions (Signed)
Moderate Conscious Sedation, Adult, Care After °These instructions provide you with information about caring for yourself after your procedure. Your health care provider may also give you more specific instructions. Your treatment has been planned according to current medical practices, but problems sometimes occur. Call your health care provider if you have any problems or questions after your procedure. °What can I expect after the procedure? °After your procedure, it is common: °· To feel sleepy for several hours. °· To feel clumsy and have poor balance for several hours. °· To have poor judgment for several hours. °· To vomit if you eat too soon. ° °Follow these instructions at home: °For at least 24 hours after the procedure: ° °· Do not: °? Participate in activities where you could fall or become injured. °? Drive. °? Use heavy machinery. °? Drink alcohol. °? Take sleeping pills or medicines that cause drowsiness. °? Make important decisions or sign legal documents. °? Take care of children on your own. °· Rest. °Eating and drinking °· Follow the diet recommended by your health care provider. °· If you vomit: °? Drink water, juice, or soup when you can drink without vomiting. °? Make sure you have little or no nausea before eating solid foods. °General instructions °· Have a responsible adult stay with you until you are awake and alert. °· Take over-the-counter and prescription medicines only as told by your health care provider. °· If you smoke, do not smoke without supervision. °· Keep all follow-up visits as told by your health care provider. This is important. °Contact a health care provider if: °· You keep feeling nauseous or you keep vomiting. °· You feel light-headed. °· You develop a rash. °· You have a fever. °Get help right away if: °· You have trouble breathing. °This information is not intended to replace advice given to you by your health care provider. Make sure you discuss any questions you have  with your health care provider. °Document Released: 04/07/2013 Document Revised: 11/20/2015 Document Reviewed: 10/07/2015 °Elsevier Interactive Patient Education © 2018 [employer] °Bone Marrow Aspiration and Bone Marrow Biopsy, Adult, Care After °This sheet gives you information about how to care for yourself after your procedure. Your health care provider may also give you more specific instructions. If you have problems or questions, contact your health care provider. °What can I expect after the procedure? °After the procedure, it is common to have: °· Mild pain and tenderness. °· Swelling. °· Bruising. ° °Follow these instructions at home: °· Take over-the-counter or prescription medicines only as told by your health care provider. °· Do not take baths, swim, or use a hot tub until your health care provider approves. Ask if you can take a shower or have a sponge bath. °· Follow instructions from your health care provider about how to take care of the puncture site. Make sure you: °? Wash your hands with soap and water before you change your bandage (dressing). If soap and water are not available, use hand sanitizer. °? Change your dressing as told by your health care provider. °· Check your puncture site every day for signs of infection. Check for: °? More redness, swelling, or pain. °? More fluid or blood. °? Warmth. °? Pus or a bad smell. °· Return to your normal activities as told by your health care provider. Ask your health care provider what activities are safe for you. °· Do not drive for 24 hours if you were given a medicine to help you relax (sedative). °·   Keep all follow-up visits as told by your health care provider. This is important. °Contact a health care provider if: °· You have more redness, swelling, or pain around the puncture site. °· You have more fluid or blood coming from the puncture site. °· Your puncture site feels warm to the touch. °· You have pus or a bad smell coming from the  puncture site. °· You have a fever. °· Your pain is not controlled with medicine. °This information is not intended to replace advice given to you by your health care provider. Make sure you discuss any questions you have with your health care provider. °Document Released: 01/04/2005 Document Revised: 01/05/2016 Document Reviewed: 11/29/2015 °Elsevier Interactive Patient Education © 2018 [employer] ° °

## 2017-06-12 NOTE — Procedures (Signed)
  Procedure: Ct R iliac bone marrow biopsy   Preprocedure diagnosis: Pancytopenia Postprocedure diagnosis: same EBL:   minimal Complications:  none immediate  See full dictation in [facility].  Dillard Cannon MD Main # [phone] Pager  [phone]

## 2017-06-12 NOTE — Consult Note (Signed)
Chief Complaint: Patient was seen in consultation today for CT guided bone marrow biopsy  Referring Physician(s): Perlov,Mikhail G  Supervising Physician: Arne Cleveland  Patient Status: [facility] - Out-pt  History of Present Illness: Timothy Mckinney is a 66 y.o. male with history of alcohol abuse, leukopenia/thrombocytopenia and splenomegaly who presents today for CT-guided bone marrow biopsy to rule out MDS.  Past Medical History:  Diagnosis Date  . Abnormal chest x-ray    ? granulomata  . Arthritis    knees and back   . DDD (degenerative disc disease), lumbar    MRI 08/2005  . Dry eyes, bilateral    Dr Bing Plume  . Elevated liver enzymes    HISTORY OF ELEVATED LIVER ENZYMES  . GERD (gastroesophageal reflux disease)    hx of   . Hypertension    NEW DIAGNOSIS AND STARTED ON METOPROLOL ON 10/04/11  . Hyperuricemia   . 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 Nephrolithiasis    Dr Serita Butcher  . Sleep apnea    STOP BANG SCORE OF 4  . Thrombocytopenia ([place])    HX OF LOW PLATELET COUNT    Past Surgical History:  Procedure Laterality Date  . ANAL FISTULECTOMY  06/01/2012   Procedure: FISTULECTOMY ANAL;  Surgeon: Leighton Ruff, MD;  Location: [facility]S;  Service: General;  Laterality: N/A;  Mucosal Advancement Flap   . CARDIAC CATHETERIZATION     in 40s, negative  . colon ulcer  2011  . COLONOSCOPY W/ POLYPECTOMY  2011   Dr Sharlett Iles  . KNEE ARTHROSCOPY  2009    L  . KNEE SURGERY     april 2013 total knee replacement removed  . LITHOTRIPSY    . tear duct plugs    . TOTAL KNEE ARTHROPLASTY  2011   Workman's Comp case, Dr Veverly Fells    Allergies: Penicillins  Medications: Prior to Admission medications   Medication Sig Start Date End Date Taking? Authorizing Provider  cycloSPORINE (RESTASIS) 0.05 % ophthalmic emulsion Place 1 drop into both eyes daily.   Yes [provider]  diphenhydrAMINE (BENADRYL) 25 MG tablet Take 25 mg by mouth. ONE AT BEDTIME IF NEEDED  SLEEP   Yes [provider]  FOLIC ACID PO Take by mouth.   Yes [provider]  Loperamide HCl (IMODIUM PO) Take by mouth daily.   Yes [provider]  metoprolol succinate (TOPROL-XL) 50 MG 24 hr tablet take 1 tablet by mouth once daily 03/10/17  Yes [name], MD  sildenafil (REVATIO) 20 MG tablet TAKE 2-5 TABLETS BY MOUTH 1 HOUR BEFORE AS NEEDED 04/29/15  Yes [provider]  Simethicone (GAS-X PO) Take by mouth daily.   Yes [provider]  Flaxseed, Linseed, (FLAXSEED OIL MAX STR) 1300 MG CAPS Take 2 capsules by mouth daily.    [provider]     Family History  Problem Relation Age of Onset  . Heart attack Father 46  . Colon polyps Mother   . Dementia Mother        died post SDH  . Cancer Paternal Grandfather        throat , pipe smoker  . Colon polyps Sister   . Cancer Sister        Colon, stage 2  . Stroke Neg Hx     Social History   Socioeconomic History  . Marital status: Married    Spouse name: None  . Number of children: None  . Years of education: None  .  Highest education level: None  Social Needs  . Financial resource strain: None  . Food insecurity - worry: None  . Food insecurity - inability: None  . Transportation needs - medical: None  . Transportation needs - non-medical: None  Occupational History  . None  Tobacco Use  . Smoking status: Never Smoker  . Smokeless tobacco: Never Used  Substance and Sexual Activity  . Alcohol use: Yes    Alcohol/week: 16.8 oz    Types: 14 Standard drinks or equivalent, 4 Cans of beer, 10 Shots of liquor per week    Comment: 4 beer and 10 liquor glasses per week  . Drug use: No  . Sexual activity: None  Other Topics Concern  . None  Social History Narrative  . None      Review of Systems denies fever, headache, chest pain, dyspnea, abdominal pain, nausea, vomiting or bleeding.  He does have easy bruising, intermittent right back discomfort, sinus  drainage with occasional cough.  Vital Signs: BP (!) 166/69 (BP Location: Right Arm)   Pulse 72   Temp 98.2 F (36.8 C) (Oral)   Resp 16   SpO2 97%   Physical Exam awake, alert.  Chest clear to auscultation bilaterally.  Heart with regular rate and rhythm.  Abdomen soft, positive bowel sounds, nontender.  Bilateral lower extremity edema noted.  Imaging: US Abdomen Complete  Result Date: 05/20/2017 CLINICAL DATA:  Thrombocytopenia, pancytopenia, ETOH abuse EXAM: ABDOMEN ULTRASOUND COMPLETE COMPARISON:  CT abdomen and pelvis of 06/19/2016 FINDINGS: Gallbladder: The gallbladder is well visualized and no gallstones are noted. There is no pain over the gallbladder with compression. Common bile duct: Diameter: The common bile duct is within upper limits of normal measuring 7.1 mm in diameter. Liver: The parenchyma of the liver is slightly echogenic suggesting fatty infiltration. No focal hepatic abnormality is seen. There is no ultrasound evidence of cirrhosis. No ascites is seen. Portal vein is patent on color Doppler imaging with normal direction of blood flow towards the liver. IVC: No abnormality visualized. Pancreas: Only the midportion of the pancreas is visualized and appears normal. Portions of the head and body of the pancreas are obscured by bowel gas. Spleen: The spleen measures 14.9 cm with volume 1034 cubic cm consistent with splenomegaly. Right Kidney: Length: 11.5 cm.  No hydronephrosis is seen. Left Kidney: Length: 11.9 cm. No hydronephrosis is noted. A small shadowing calculus of 9 mm in diameter is noted in the lower pole. Abdominal aorta: The abdominal aorta is normal in caliber Other findings: None. IMPRESSION: 1. Inhomogeneous and echogenic liver parenchyma most consistent with fatty infiltration. No focal hepatic abnormality. 2. No gallstones. 3. Splenomegaly with a volume of 1034 cubic cm. 4. No ultrasound evidence of cirrhosis is seen. No ascites is noted. Electronically Signed    By: Ivar Drape M.D.   On: 05/20/2017 11:55    Labs:  CBC: Recent Labs    06/17/16 1104 12/17/16 1058 04/03/17 1331 05/15/17 1506  WBC 3.5* 3.3* 2.9* 3.8*  HGB 14.9 14.2 13.3 13.8  HCT 43.1 41.3 39.1 40.9  PLT 94.0 Repeated and verified X2.* 91.0* 76.0* 75*    COAGS: Recent Labs    04/03/17 1331  INR 1.4*    BMP: Recent Labs    06/17/16 1104 12/17/16 1058 05/15/17 1506  NA 137 133* 135*  K 4.6 4.6 4.3  CL 97 96  --   CO2 '31 29 28  ' GLUCOSE 139* 121* 115  BUN 13 13 11.5  CALCIUM  9.0 9.5 8.9  CREATININE 0.74 0.76 0.8    LIVER FUNCTION TESTS: Recent Labs    06/17/16 1104 12/17/16 1058 05/15/17 1506  BILITOT 1.2 2.4* 3.35*  AST 96* 85* 97*  ALT 56* 38 30  ALKPHOS 80 91 130  PROT 6.7 6.5 7.1  ALBUMIN 4.1 3.9 3.2*    TUMOR MARKERS: No results for input(s): AFPTM, CEA, CA199, CHROMGRNA in the last 8760 hours.  Assessment and Plan: 66 y.o. male with history of alcohol abuse, leukopenia/thrombocytopenia and splenomegaly who presents today for CT-guided bone marrow biopsy to rule out MDS.Risks and benefits discussed with the patient including, but not limited to bleeding, infection, damage to adjacent structures or low yield requiring additional tests.All of the patient's questions were answered, patient is agreeable to proceed.Consent signed and in chart.     Thank you for this interesting consult.  I greatly enjoyed meeting AREEB CORRON and look forward to participating in their care.  A copy of this report was sent to the requesting provider on this date.  Electronically Signed: D. Rowe Hyder, PA-C 06/12/2017, 10:04 AM   I spent a total of 25 minutes in face to face in clinical consultation, greater than 50% of which was counseling/coordinating care for CT guided bone marrow biopsy

## 2017-06-21 ENCOUNTER — Telehealth: Payer: Self-pay | Admitting: Hematology and Oncology

## 2017-06-21 NOTE — Telephone Encounter (Signed)
Left message on voicemail regarding appt per sched msg 12/20

## 2017-06-25 ENCOUNTER — Encounter (HOSPITAL_COMMUNITY): Payer: Self-pay

## 2017-06-25 LAB — TISSUE HYBRIDIZATION (BONE MARROW)-NCBH

## 2017-06-25 LAB — CHROMOSOME ANALYSIS, BONE MARROW

## 2017-07-04 ENCOUNTER — Ambulatory Visit (HOSPITAL_BASED_OUTPATIENT_CLINIC_OR_DEPARTMENT_OTHER): Payer: PPO | Admitting: Hematology and Oncology

## 2017-07-04 ENCOUNTER — Other Ambulatory Visit: Payer: Self-pay

## 2017-07-04 ENCOUNTER — Encounter: Payer: Self-pay | Admitting: Hematology and Oncology

## 2017-07-04 ENCOUNTER — Telehealth: Payer: Self-pay | Admitting: Hematology and Oncology

## 2017-07-04 ENCOUNTER — Other Ambulatory Visit (HOSPITAL_BASED_OUTPATIENT_CLINIC_OR_DEPARTMENT_OTHER): Payer: PPO

## 2017-07-04 DIAGNOSIS — D61818 Other pancytopenia: Secondary | ICD-10-CM | POA: Diagnosis not present

## 2017-07-04 DIAGNOSIS — F1010 Alcohol abuse, uncomplicated: Secondary | ICD-10-CM | POA: Diagnosis not present

## 2017-07-04 DIAGNOSIS — D693 Immune thrombocytopenic purpura: Secondary | ICD-10-CM

## 2017-07-04 DIAGNOSIS — D7589 Other specified diseases of blood and blood-forming organs: Secondary | ICD-10-CM | POA: Diagnosis not present

## 2017-07-04 DIAGNOSIS — D696 Thrombocytopenia, unspecified: Secondary | ICD-10-CM

## 2017-07-04 DIAGNOSIS — D72819 Decreased white blood cell count, unspecified: Secondary | ICD-10-CM

## 2017-07-04 DIAGNOSIS — K766 Portal hypertension: Secondary | ICD-10-CM | POA: Diagnosis not present

## 2017-07-04 LAB — CBC WITH DIFFERENTIAL/PLATELET
BASO%: 0.4 % (ref 0.0–2.0)
Basophils Absolute: 0 10*3/uL (ref 0.0–0.1)
EOS%: 5.7 % (ref 0.0–7.0)
Eosinophils Absolute: 0.2 10*3/uL (ref 0.0–0.5)
HEMATOCRIT: 37 % — AB (ref 38.4–49.9)
HEMOGLOBIN: 12.3 g/dL — AB (ref 13.0–17.1)
LYMPH#: 0.9 10*3/uL (ref 0.9–3.3)
LYMPH%: 30.2 % (ref 14.0–49.0)
MCH: 36.1 pg — ABNORMAL HIGH (ref 27.2–33.4)
MCHC: 33.2 g/dL (ref 32.0–36.0)
MCV: 108.5 fL — ABNORMAL HIGH (ref 79.3–98.0)
MONO#: 0.2 10*3/uL (ref 0.1–0.9)
MONO%: 8.5 % (ref 0.0–14.0)
NEUT#: 1.6 10*3/uL (ref 1.5–6.5)
NEUT%: 55.2 % (ref 39.0–75.0)
NRBC: 0 % (ref 0–0)
Platelets: 56 10*3/uL — ABNORMAL LOW (ref 140–400)
RBC: 3.41 10*6/uL — ABNORMAL LOW (ref 4.20–5.82)
RDW: 15 % — AB (ref 11.0–14.6)
WBC: 2.8 10*3/uL — ABNORMAL LOW (ref 4.0–10.3)

## 2017-07-04 LAB — COMPREHENSIVE METABOLIC PANEL
ALK PHOS: 125 U/L (ref 40–150)
ALT: 26 U/L (ref 0–55)
ANION GAP: 6 meq/L (ref 3–11)
AST: 100 U/L — ABNORMAL HIGH (ref 5–34)
Albumin: 2.9 g/dL — ABNORMAL LOW (ref 3.5–5.0)
BILIRUBIN TOTAL: 4.59 mg/dL — AB (ref 0.20–1.20)
BUN: 9.7 mg/dL (ref 7.0–26.0)
CO2: 28 meq/L (ref 22–29)
Calcium: 8.5 mg/dL (ref 8.4–10.4)
Chloride: 101 mEq/L (ref 98–109)
Creatinine: 0.8 mg/dL (ref 0.7–1.3)
GLUCOSE: 168 mg/dL — AB (ref 70–140)
POTASSIUM: 4.3 meq/L (ref 3.5–5.1)
Sodium: 136 mEq/L (ref 136–145)
TOTAL PROTEIN: 6.5 g/dL (ref 6.4–8.3)

## 2017-07-04 MED ORDER — LORAZEPAM 1 MG PO TABS: 1.0000 mg | ORAL_TABLET | Freq: Three times a day (TID) | ORAL | 0 refills | Status: AC

## 2017-07-04 MED ORDER — LORAZEPAM 1 MG PO TABS: 1.0000 mg | ORAL_TABLET | Freq: Three times a day (TID) | ORAL | 0 refills | Status: DC

## 2017-07-04 NOTE — Telephone Encounter (Signed)
Scheduled appt per 1/4 los - Gave patient AVS and calender per los.  

## 2017-07-07 ENCOUNTER — Ambulatory Visit: Payer: PPO | Admitting: Family Medicine

## 2017-07-09 ENCOUNTER — Telehealth: Payer: Self-pay

## 2017-07-09 NOTE — Telephone Encounter (Signed)
Patient's wife called with concerns about pt.'s BP- 110/38 and patient complaining of some "dizziness and spaciness and questions about Ativan dosage. Dr. Lebron Conners made aware. Patient currently taking Ativan 1 mg by mouth every 8 hours. Per Dr. Lebron Conners, patient to decrease Ativan to 0.5mg  TID for 3 days, then down to BID for 3 days, then QHS for 3 days, then stop. Patient's wife repeated instructions and verbalized understanding. Will call the office with any further questions or concerns.

## 2017-07-16 DIAGNOSIS — D61818 Other pancytopenia: Secondary | ICD-10-CM | POA: Insufficient documentation

## 2017-07-16 NOTE — Progress Notes (Signed)
[facility] Follow-up Visit:  Assessment: Other pancytopenia ([place]) 67 y.o.-year-old male with history of alcohol abuse, and possible ongoing alcohol abuse referred to our clinic for thrombocytopenia.  Review of hematological profile reveals macrocytic progressive anemia, progressive thrombocytopenia and leukopenia.  Our additional evaluation confirmed presence of persistent leukopenia with mild neutropenia, mild anemia with pronounced macrocytosis and hypochromia, and progressive thrombocytopenia.  At the same time, significant elevation of bilirubin was discovered accompanied by elevation of AST more than twice the elevation of ALT consistent with alcohol injury to the liver.  Ultrasound of the abdomen demonstrates splenomegaly, fatty liver possibly contributing to portal hypertension resulting in splenomegaly.  Bone marrow biopsy was obtained with results demonstrating hypercellular bone marrow without dysmorphic features to demonstrate presence of MDS or AML.  Cytogenetics and molecular analysis are also normal.  Patient is currently drinking 4-6 drinks of alcohol per day in the form of 2-3 glasses of vodka.  This is likely the precipitating cause for both his hepatic abnormalities as the pattern of injury is telling.  In addition, this also serves as a direct toxin to the bone marrow suppressing normal bone marrow functioning.  additional lab work demonstrates deficiency in vitamin B6.   Plan: --Start stress B complex for multi B vitamin supplementation --Patient strongly instructed to discontinue alcohol abuse.  He is willing to try and will receive lorazepam taper for withdrawal management --Return to our clinic in 2 weeks with labs for continued clinical monitoring   Voice recognition software was used and creation of this note. Despite my best effort at editing the text, some misspelling/errors may have occurred.  Orders Placed This Encounter  Procedures  . CBC & Diff  and Retic    Standing Status:   Future    Standing Expiration Date:   07/04/2018  . Comprehensive metabolic panel    Standing Status:   Future    Standing Expiration Date:   07/04/2018  . Lactate dehydrogenase (LDH)    Standing Status:   Future    Standing Expiration Date:   07/04/2018  . Iron and TIBC    Standing Status:   Future    Standing Expiration Date:   07/04/2018  . Ferritin    Standing Status:   Future    Standing Expiration Date:   07/04/2018  . Vitamin B6    Standing Status:   Future    Standing Expiration Date:   07/04/2018    Cancer Staging No matching staging information was found for the patient.  All questions were answered.  . The patient knows to call the clinic with any problems, questions or concerns.  This note was electronically signed.    History of Presenting Illness Timothy Mckinney is a 67 y.o. male followed in to the [facility] for thrombocytopenia evaluation, referred by Dr [name].  Review of past medical history significant for hypertension and alcohol abuse.  Currently, patient reports still drinking 1-2 drinks intermittently.  Denies daily alcohol use.  Hematological trends reflected below.  Currently, patient denies any nausea, vomiting, fevers, chills, night sweats.  Denies any abdominal pain or early satiety.  No diarrhea or constipation.  No abdominal bloating.  Denies any chest pain, shortness of breath, or cough.  No easy bruisability, gum bleeding, epistaxis, hemoptysis, hematemesis, hematuria, hematochezia, or melena.  Patient returns to the clinic to review findings of the bone marrow biopsy obtained previously.  Patient denies any new symptoms since the last visit to the clinic.  Oncological/hematological History: --Labs, 12/17/16: WBC 3.3, ANC 1.9, ALC 0.8, Mono 0.3, Hgb 14.2, MCV 107.7, MCH     ..., RDW     ..., Plt 91; TSH 2.89, Folate 4.0, Vit B12 460; Elevated AST, tBili --Labs, 04/03/17: WBC 2.9, ANC 1.7, ALC 0.8, Mono 0.2, Hgb  13.3, MCV 108.5, MCH     ..., RDW 14.0, Plt 76 --Labs, 05/15/17: WBC 3.8, ANC 2.4, ALC 0.9, Mono 0.3, Hgb 13.8, MCV 107.9, MCH 36.4, RDW 14.1, Plt 75; tBili 3.4, AP 130, AST 97, ALT 30, LDH 228; Folate 18.1, Vit B12 628, Vit B6 1.8(down), Vit B1 107.8, MMA 158(wnl), Homocysteine 13(wnl) --[place] RtUQ, 05/20/17: Fatty liver infiltration, no evidence of cirrhosis.  Splenomegaly with spleen measuring 14.9 cm and volume of 1034 cm. --Labs, 06/12/17: WBC 3.4, ANC 2.1, ALC 0.8, Mono 0.8, Hgb 12.9, MCV 106.8, MCH 36.5, RDW 14.6, Plt 75 --BM Bx, 06/12/17: Pathology --hypercellular bone marrow with relative abundance of erythroid precursors.  No evidence of dyspoiesis or increase in blasts.  CytoGen & FISH -- normal. --Labs, 07/04/17: WBC 2.8, ANC 1.6, ALC 0.9, Mono 0.2, Hgb 12.3, MCV 108.5, MCH 36.1, RDW 15.0, Plt 56; tBili 4.6, AP 125, AST 100, ALT 26;    Medical History: Past Medical History:  Diagnosis Date  . Abnormal chest x-ray    ? granulomata  . Arthritis    knees and back   . DDD (degenerative disc disease), lumbar    MRI 08/2005  . Dry eyes, bilateral    Dr Bing Plume  . Elevated liver enzymes    HISTORY OF ELEVATED LIVER ENZYMES  . GERD (gastroesophageal reflux disease)    hx of   . Hypertension    NEW DIAGNOSIS AND STARTED ON METOPROLOL ON 10/04/11  . Hyperuricemia   . 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 Nephrolithiasis    Dr Serita Butcher  . Sleep apnea    STOP BANG SCORE OF 4  . Thrombocytopenia ([place])    HX OF LOW PLATELET COUNT    Surgical History: Past Surgical History:  Procedure Laterality Date  . ANAL FISTULECTOMY  06/01/2012   Procedure: FISTULECTOMY ANAL;  Surgeon: Leighton Ruff, MD;  Location: [facility];  Service: General;  Laterality: N/A;  Mucosal Advancement Flap   . CARDIAC CATHETERIZATION     in 40s, negative  . colon ulcer  2011  . COLONOSCOPY W/ POLYPECTOMY  2011   Dr Sharlett Iles  . KNEE ARTHROSCOPY  2009    L  . KNEE SURGERY     april 2013 total knee  replacement removed  . LITHOTRIPSY    . tear duct plugs    . TOTAL KNEE ARTHROPLASTY  2011   Workman's Comp case, Dr Veverly Fells    Family History: Family History  Problem Relation Age of Onset  . Heart attack Father 97  . Colon polyps Mother   . Dementia Mother        died post SDH  . Cancer Paternal Grandfather        throat , pipe smoker  . Colon polyps Sister   . Cancer Sister        Colon, stage 2  . Stroke Neg Hx     Social History: Social History   Socioeconomic History  . Marital status: Married    Spouse name: Not on file  . Number of children: Not on file  . Years of education: Not on file  . Highest education level: Not on file  Social Needs  . Emergency planning/management officer  strain: Not on file  . Food insecurity - worry: Not on file  . Food insecurity - inability: Not on file  . Transportation needs - medical: Not on file  . Transportation needs - non-medical: Not on file  Occupational History  . Not on file  Tobacco Use  . Smoking status: Never Smoker  . Smokeless tobacco: Never Used  Substance and Sexual Activity  . Alcohol use: Yes    Alcohol/week: 16.8 oz    Types: 14 Standard drinks or equivalent, 4 Cans of beer, 10 Shots of liquor per week    Comment: 4 beer and 10 liquor glasses per week  . Drug use: No  . Sexual activity: Not on file  Other Topics Concern  . Not on file  Social History Narrative  . Not on file    Allergies: Allergies  Allergen Reactions  . Penicillins Swelling    Diffuse swelling    Medications:  Current Outpatient Medications  Medication Sig Dispense Refill  . cycloSPORINE (RESTASIS) 0.05 % ophthalmic emulsion Place 1 drop into both eyes daily.    . diphenhydrAMINE (BENADRYL) 25 MG tablet Take 25 mg by mouth. ONE AT BEDTIME IF NEEDED SLEEP    . FOLIC ACID PO Take by mouth.    . Loperamide HCl (IMODIUM PO) Take by mouth daily.    . metoprolol succinate (TOPROL-XL) 50 MG 24 hr tablet take 1 tablet by mouth once daily 90 tablet 1   . sildenafil (REVATIO) 20 MG tablet TAKE 2-5 TABLETS BY MOUTH 1 HOUR BEFORE AS NEEDED  11  . Simethicone (GAS-X PO) Take by mouth daily.    Marland Kitchen LORazepam (ATIVAN) 1 MG tablet Take 1 tablet (1 mg total) by mouth every 8 (eight) hours for 14 days. OK to decrease to 0.87m PO Q8hrs in one week if no withdrawal symptoms 42 tablet 0   No current facility-administered medications for this visit.     Review of Systems: Review of Systems  All other systems reviewed and are negative.    PHYSICAL EXAMINATION There were no vitals taken for this visit.  ECOG PERFORMANCE STATUS: 1 - Symptomatic but completely ambulatory  Physical Exam  Constitutional: He is oriented to person, place, and time and well-developed, well-nourished, and in no distress. No distress.  HENT:  Head: Normocephalic and atraumatic.  Mouth/Throat: Oropharynx is clear and moist. No oropharyngeal exudate.  Eyes: Conjunctivae and EOM are normal. Pupils are equal, round, and reactive to light. No scleral icterus.  Neck: No thyromegaly present.  Cardiovascular: Normal rate, regular rhythm and normal heart sounds.  No murmur heard. Pulmonary/Chest: Effort normal and breath sounds normal. No respiratory distress. He has no wheezes. He has no rales. He exhibits no tenderness.  Abdominal: Soft. Bowel sounds are normal. He exhibits no distension. There is no tenderness. There is no rebound and no guarding.  Musculoskeletal: He exhibits no edema.  Lymphadenopathy:    He has no cervical adenopathy.  Neurological: He is alert and oriented to person, place, and time. He has normal reflexes. No cranial nerve deficit.  Skin: Skin is warm. No rash noted. He is not diaphoretic. No erythema. No pallor.     LABORATORY DATA: I have personally reviewed the data as listed: Appointment on 07/04/2017  Component Date Value Ref Range Status  . Sodium 07/04/2017 136  136 - 145 mEq/L Final  . Potassium 07/04/2017 4.3  3.5 - 5.1 mEq/L Final  .  Chloride 07/04/2017 101  98 - 109 mEq/L Final  .  CO2 07/04/2017 28  22 - 29 mEq/L Final  . Glucose 07/04/2017 168* 70 - 140 mg/dl Final   Glucose reference range is for nonfasting patients. Fasting glucose reference range is 70- 100.  Marland Kitchen BUN 07/04/2017 9.7  7.0 - 26.0 mg/dL Final  . Creatinine 07/04/2017 0.8  0.7 - 1.3 mg/dL Final  . Total Bilirubin 07/04/2017 4.59* 0.20 - 1.20 mg/dL Final  . Alkaline Phosphatase 07/04/2017 125  40 - 150 U/L Final  . AST 07/04/2017 100* 5 - 34 U/L Final  . ALT 07/04/2017 26  0 - 55 U/L Final  . Total Protein 07/04/2017 6.5  6.4 - 8.3 g/dL Final  . Albumin 07/04/2017 2.9* 3.5 - 5.0 g/dL Final  . Calcium 07/04/2017 8.5  8.4 - 10.4 mg/dL Final  . Anion Gap 07/04/2017 6  3 - 11 mEq/L Final  . EGFR 07/04/2017 >60  >60 ml/min/1.73 m2 Final   eGFR is calculated using the CKD-EPI Creatinine Equation (2009)  . WBC 07/04/2017 2.8* 4.0 - 10.3 10e3/uL Final  . NEUT# 07/04/2017 1.6  1.5 - 6.5 10e3/uL Final  . HGB 07/04/2017 12.3* 13.0 - 17.1 g/dL Final  . HCT 07/04/2017 37.0* 38.4 - 49.9 % Final  . Platelets 07/04/2017 56* 140 - 400 10e3/uL Final  . MCV 07/04/2017 108.5* 79.3 - 98.0 fL Final  . MCH 07/04/2017 36.1* 27.2 - 33.4 pg Final  . MCHC 07/04/2017 33.2  32.0 - 36.0 g/dL Final  . RBC 07/04/2017 3.41* 4.20 - 5.82 10e6/uL Final  . RDW 07/04/2017 15.0* 11.0 - 14.6 % Final  . lymph# 07/04/2017 0.9  0.9 - 3.3 10e3/uL Final  . MONO# 07/04/2017 0.2  0.1 - 0.9 10e3/uL Final  . Eosinophils Absolute 07/04/2017 0.2  0.0 - 0.5 10e3/uL Final  . Basophils Absolute 07/04/2017 0.0  0.0 - 0.1 10e3/uL Final  . NEUT% 07/04/2017 55.2  39.0 - 75.0 % Final  . LYMPH% 07/04/2017 30.2  14.0 - 49.0 % Final  . MONO% 07/04/2017 8.5  0.0 - 14.0 % Final  . EOS% 07/04/2017 5.7  0.0 - 7.0 % Final  . BASO% 07/04/2017 0.4  0.0 - 2.0 % Final  . nRBC 07/04/2017 0  0 - 0 % Final       Ardath Sax, MD

## 2017-07-16 NOTE — Assessment & Plan Note (Signed)
67 y.o.-year-old male with history of alcohol abuse, and possible ongoing alcohol abuse referred to our clinic for thrombocytopenia.  Review of hematological profile reveals macrocytic progressive anemia, progressive thrombocytopenia and leukopenia.  Our additional evaluation confirmed presence of persistent leukopenia with mild neutropenia, mild anemia with pronounced macrocytosis and hypochromia, and progressive thrombocytopenia.  At the same time, significant elevation of bilirubin was discovered accompanied by elevation of AST more than twice the elevation of ALT consistent with alcohol injury to the liver.  Ultrasound of the abdomen demonstrates splenomegaly, fatty liver possibly contributing to portal hypertension resulting in splenomegaly.  Bone marrow biopsy was obtained with results demonstrating hypercellular bone marrow without dysmorphic features to demonstrate presence of MDS or AML.  Cytogenetics and molecular analysis are also normal.  Patient is currently drinking 4-6 drinks of alcohol per day in the form of 2-3 glasses of vodka.  This is likely the precipitating cause for both his hepatic abnormalities as the pattern of injury is telling.  In addition, this also serves as a direct toxin to the bone marrow suppressing normal bone marrow functioning.  additional lab work demonstrates deficiency in vitamin B6.   Plan: --Start stress B complex for multi B vitamin supplementation --Patient strongly instructed to discontinue alcohol abuse.  He is willing to try and will receive lorazepam taper for withdrawal management --Return to our clinic in 2 weeks with labs for continued clinical monitoring

## 2017-07-21 ENCOUNTER — Telehealth: Payer: Self-pay

## 2017-07-21 NOTE — Telephone Encounter (Signed)
Received fax documentation of phone call from pt explaining that he had experienced some previous itching from his medication, but since taking benadryl as directed by Dr. Lebron Conners the itching is resolved and he feels fine. Dr. Lebron Conners made aware of message.

## 2017-07-24 ENCOUNTER — Encounter: Payer: Self-pay | Admitting: Hematology and Oncology

## 2017-07-24 ENCOUNTER — Inpatient Hospital Stay: Payer: PPO

## 2017-07-24 ENCOUNTER — Telehealth: Payer: Self-pay | Admitting: Hematology and Oncology

## 2017-07-24 ENCOUNTER — Telehealth: Payer: Self-pay

## 2017-07-24 ENCOUNTER — Other Ambulatory Visit: Payer: Self-pay

## 2017-07-24 ENCOUNTER — Inpatient Hospital Stay: Payer: PPO | Attending: Hematology and Oncology | Admitting: Hematology and Oncology

## 2017-07-24 VITALS — BP 128/46 | HR 68 | Temp 98.1°F | Resp 18 | Wt 281.8 lb

## 2017-07-24 DIAGNOSIS — D61818 Other pancytopenia: Secondary | ICD-10-CM

## 2017-07-24 DIAGNOSIS — F1010 Alcohol abuse, uncomplicated: Secondary | ICD-10-CM | POA: Insufficient documentation

## 2017-07-24 DIAGNOSIS — D539 Nutritional anemia, unspecified: Secondary | ICD-10-CM | POA: Diagnosis not present

## 2017-07-24 LAB — COMPREHENSIVE METABOLIC PANEL
ALBUMIN: 2.9 g/dL — AB (ref 3.5–5.0)
ALT: 35 U/L (ref 0–55)
AST: 117 U/L — ABNORMAL HIGH (ref 5–34)
Alkaline Phosphatase: 123 U/L (ref 40–150)
Anion gap: 7 (ref 3–11)
BILIRUBIN TOTAL: 5.3 mg/dL — AB (ref 0.2–1.2)
BUN: 13 mg/dL (ref 7–26)
CHLORIDE: 104 mmol/L (ref 98–109)
CO2: 28 mmol/L (ref 22–29)
CREATININE: 0.81 mg/dL (ref 0.70–1.30)
Calcium: 8.9 mg/dL (ref 8.4–10.4)
GFR calc Af Amer: >60 mL/min (ref >60)
GLUCOSE: 121 mg/dL (ref 70–140)
Potassium: 4.4 mmol/L (ref 3.5–5.1)
Sodium: 139 mmol/L (ref 136–145)
Total Protein: 6.6 g/dL (ref 6.4–8.3)

## 2017-07-24 LAB — RETICULOCYTES
RBC.: 3.47 MIL/uL — ABNORMAL LOW (ref 4.20–5.82)
RETIC COUNT ABSOLUTE: 100.6 10*3/uL — AB (ref 34.8–93.9)
RETIC CT PCT: 2.9 % — AB (ref 0.8–1.8)

## 2017-07-24 LAB — CBC WITH DIFFERENTIAL (CANCER CENTER ONLY)
Basophils Absolute: 0 10*3/uL (ref 0.0–0.1)
Basophils Relative: 0 %
EOS ABS: 0.2 10*3/uL (ref 0.0–0.5)
EOS PCT: 5 %
HCT: 37.8 % — ABNORMAL LOW (ref 38.4–49.9)
Hemoglobin: 12.5 g/dL — ABNORMAL LOW (ref 13.0–17.1)
LYMPHS ABS: 1.2 10*3/uL (ref 0.9–3.3)
Lymphocytes Relative: 25 %
MCH: 36 pg — AB (ref 27.2–33.4)
MCHC: 33.1 g/dL (ref 32.0–36.0)
MCV: 108.9 fL — AB (ref 79.3–98.0)
MONO ABS: 0.4 10*3/uL (ref 0.1–0.9)
Monocytes Relative: 8 %
Neutro Abs: 2.9 10*3/uL (ref 1.5–6.5)
Neutrophils Relative %: 62 %
PLATELETS: 100 10*3/uL — AB (ref 140–400)
RBC: 3.47 MIL/uL — AB (ref 4.20–5.82)
RDW: 14.4 % (ref 11.0–15.6)
WBC: 4.7 10*3/uL (ref 4.0–10.3)

## 2017-07-24 LAB — IRON AND TIBC
IRON: 162 ug/dL (ref 42–163)
Saturation Ratios: UNDETERMINED % (ref 42–163)
TIBC: 155 ug/dL — ABNORMAL LOW (ref 202–409)
UIBC: UNDETERMINED ug/dL

## 2017-07-24 LAB — FERRITIN: FERRITIN: 375 ng/mL — AB (ref 22–316)

## 2017-07-24 LAB — LACTATE DEHYDROGENASE: LDH: 178 U/L (ref 125–245)

## 2017-07-24 NOTE — Telephone Encounter (Signed)
Gave avs and calendar for april °

## 2017-07-24 NOTE — Telephone Encounter (Signed)
Received call from the lab about patient's Bilirubin of 5.3. Dr. Lebron Conners made aware.

## 2017-07-26 LAB — VITAMIN B6: Vitamin B6: 12.9 ug/L (ref 5.3–46.7)

## 2017-07-29 ENCOUNTER — Other Ambulatory Visit: Payer: Self-pay

## 2017-07-29 ENCOUNTER — Ambulatory Visit (INDEPENDENT_AMBULATORY_CARE_PROVIDER_SITE_OTHER): Payer: PPO | Admitting: Family Medicine

## 2017-07-29 ENCOUNTER — Encounter: Payer: Self-pay | Admitting: Family Medicine

## 2017-07-29 ENCOUNTER — Telehealth: Payer: Self-pay | Admitting: Family Medicine

## 2017-07-29 VITALS — BP 124/60 | HR 65 | Temp 98.5°F | Resp 16 | Ht 71.0 in | Wt 285.4 lb

## 2017-07-29 DIAGNOSIS — I10 Essential (primary) hypertension: Secondary | ICD-10-CM | POA: Diagnosis not present

## 2017-07-29 DIAGNOSIS — E785 Hyperlipidemia, unspecified: Secondary | ICD-10-CM | POA: Diagnosis not present

## 2017-07-29 DIAGNOSIS — K709 Alcoholic liver disease, unspecified: Secondary | ICD-10-CM | POA: Diagnosis not present

## 2017-07-29 LAB — LIPID PANEL
CHOL/HDL RATIO: 8
Cholesterol: 160 mg/dL (ref 0–200)
HDL: 20.8 mg/dL — ABNORMAL LOW (ref >39.00)
LDL CALC: 113 mg/dL — AB (ref 0–99)
NONHDL: 138.78
Triglycerides: 127 mg/dL (ref 0.0–149.0)
VLDL: 25.4 mg/dL (ref 0.0–40.0)

## 2017-07-29 NOTE — Assessment & Plan Note (Signed)
Ongoing issue for pt.  He is hoping to avoid statins due to his liver issues and is trying to control w/ diet and exercise.  Check lipid panel and adjust tx plan prn.

## 2017-07-29 NOTE — Assessment & Plan Note (Signed)
New to provider, ongoing for pt.  He has dramatically reduced his drinking in response to his elevated liver enzymes, fatty liver, and pancytopenia.  Encouraged him to abstain from alcohol going forward.  Will follow along w/ treating providers and offer support to pt when needed.

## 2017-07-29 NOTE — Assessment & Plan Note (Signed)
Ongoing issue for pt.  BMI is 39.8 but he has 2 associated comorbidities, qualifying him as morbidly obese.  Stressed need for healthy diet and regular exercise.  Will follow.

## 2017-07-29 NOTE — Assessment & Plan Note (Signed)
Chronic problem.  Well controlled today.  Asymptomatic.  Reviewed recent labs.  No changes at this time.

## 2017-07-29 NOTE — Patient Instructions (Addendum)
Schedule your complete physical for after 6/19 and your Medicare Wellness at the same time [facility] notify you of your lab results and make any changes if needed. Continue to work on healthy diet and regular exercise- you can do it!! Continue to avoid alcohol- you can do it! If you decide to do something about the ganglion cyst- let me know! Call with any questions or concerns Happy New Year!!

## 2017-07-29 NOTE — Progress Notes (Signed)
   Subjective:    Patient ID: Timothy Mckinney, male    DOB: 05-Jan-1951, 67 y.o.   MRN: [id number]  HPI HTN- chronic problem.  On Metoprolol 50mg  daily w/ good control.  Denies CP, SOB, HAs, visual changes, edema.  Hyperlipidemia- LDL was 140 at last check.  Pt was attempting to work on diet and exercise but has gained weight.    Obesity- pt's BMI 39.8 w/ associated HTN and hyperlipidemia, qualifying as morbid obesity.  Alcoholic liver disease- pt has seen GI and hematology and has been told to stop drinking.  Pt has mildly elevated AST and fatty infiltration on [place].  Pt is working hard on stopping drinking (vodka is drink of choice).   Review of Systems For ROS see HPI     Objective:   Physical Exam  Constitutional: He is oriented to person, place, and time. He appears well-developed and well-nourished. No distress.  obese  HENT:  Head: Normocephalic and atraumatic.  Eyes: Conjunctivae and EOM are normal. Pupils are equal, round, and reactive to light.  Neck: Normal range of motion. Neck supple. No thyromegaly present.  Cardiovascular: Normal rate, regular rhythm, normal heart sounds and intact distal pulses.  No murmur heard. Pulmonary/Chest: Effort normal and breath sounds normal. No respiratory distress.  Abdominal: Soft. Bowel sounds are normal. He exhibits no distension.  Musculoskeletal: He exhibits no edema.  Lymphadenopathy:    He has no cervical adenopathy.  Neurological: He is alert and oriented to person, place, and time. No cranial nerve deficit.  Skin: Skin is warm and dry.  Psychiatric: He has a normal mood and affect. His behavior is normal.  Vitals reviewed.         Assessment & Plan:

## 2017-07-29 NOTE — Telephone Encounter (Signed)
Copied from [place]. Topic: Quick Communication - See Telephone Encounter >> Jul 29, 2017  3:33 PM Percell Belt A wrote: CRM for notification. See Telephone encounter for: pt was seen today but forgot to ask...Marland Kitchen 1- since the platelets are up a little can he get the teeth pulled that he has been needing to get pulled  2- He as also been taking B Complex, he would like to know if dr thought that maybe that could be making has stomach hurt?     07/29/17.

## 2017-07-30 ENCOUNTER — Encounter: Payer: Self-pay | Admitting: General Practice

## 2017-07-30 NOTE — Telephone Encounter (Signed)
Routed to PCP to advise.

## 2017-07-30 NOTE — Telephone Encounter (Signed)
Patient notified of PCP recommendations and is agreement and expresses an understanding.   Ok for PEC to Discuss results / PCP recommendations / Schedule patient.   

## 2017-07-30 NOTE — Telephone Encounter (Signed)
1) Yes, with platelets of 100 he can get his teeth pulled (but I would run that by Dr Lebron Conners) 2) B complex should not be causing abd pain but it's not impossible.  He could hold this for a few days and see if symptoms improve

## 2017-08-12 NOTE — Assessment & Plan Note (Signed)
67 y.o.-year-old male with history of alcohol abuse, and possible ongoing alcohol abuse referred to our clinic for thrombocytopenia.  Review of hematological profile reveals macrocytic progressive anemia, progressive thrombocytopenia and leukopenia.  Our additional evaluation confirmed presence of persistent leukopenia with mild neutropenia, mild anemia with pronounced macrocytosis and hypochromia, and progressive thrombocytopenia.  At the same time, significant elevation of bilirubin was discovered accompanied by elevation of AST more than twice the elevation of ALT consistent with alcohol injury to the liver.  Ultrasound of the abdomen demonstrated splenomegaly, fatty liver possibly contributing to portal hypertension resulting in splenomegaly.  Bone marrow biopsy was obtained with results demonstrating hypercellular bone marrow without dysmorphic features to demonstrate presence of MDS or AML.  Cytogenetics and molecular analysis are also normal.  Patient is currently drinking 4-6 drinks of alcohol per day in the form of 2-3 glasses of vodka.  This is likely the precipitating cause for both his hepatic abnormalities as the pattern of injury is telling.  In addition, this also serves as a direct toxin to the bone marrow suppressing normal bone marrow functioning.  additional lab work demonstrates deficiency in vitamin B6.  Since last visit to the clinic, patient has reduced amount of alcohol consumed and was receiving supplemental B vitamins.  Interval lab work demonstrates significant improvement in white blood cell count and platelets, stable hemoglobin levels.  Liver function tests continue to demonstrate abnormalities consistent with ongoing alcohol use versus chronic liver injury from previous abuse..   Plan: -Encourage patient to continue alcohol abstinence -Continue vitamin B supplementation - Return to clinic in 3 months for continued hematological monitoring.

## 2017-08-12 NOTE — Progress Notes (Signed)
[facility] Follow-up Visit:  Assessment: Other pancytopenia ([place]) 67 y.o.-year-old male with history of alcohol abuse, and possible ongoing alcohol abuse referred to our clinic for thrombocytopenia.  Review of hematological profile reveals macrocytic progressive anemia, progressive thrombocytopenia and leukopenia.  Our additional evaluation confirmed presence of persistent leukopenia with mild neutropenia, mild anemia with pronounced macrocytosis and hypochromia, and progressive thrombocytopenia.  At the same time, significant elevation of bilirubin was discovered accompanied by elevation of AST more than twice the elevation of ALT consistent with alcohol injury to the liver.  Ultrasound of the abdomen demonstrated splenomegaly, fatty liver possibly contributing to portal hypertension resulting in splenomegaly.  Bone marrow biopsy was obtained with results demonstrating hypercellular bone marrow without dysmorphic features to demonstrate presence of MDS or AML.  Cytogenetics and molecular analysis are also normal.  Patient is currently drinking 4-6 drinks of alcohol per day in the form of 2-3 glasses of vodka.  This is likely the precipitating cause for both his hepatic abnormalities as the pattern of injury is telling.  In addition, this also serves as a direct toxin to the bone marrow suppressing normal bone marrow functioning.  additional lab work demonstrates deficiency in vitamin B6.  Since last visit to the clinic, patient has reduced amount of alcohol consumed and was receiving supplemental B vitamins.  Interval lab work demonstrates significant improvement in white blood cell count and platelets, stable hemoglobin levels.  Liver function tests continue to demonstrate abnormalities consistent with ongoing alcohol use versus chronic liver injury from previous abuse..   Plan: -Encourage patient to continue alcohol abstinence -Continue vitamin B supplementation - Return to  clinic in 3 months for continued hematological monitoring.   Voice recognition software was used and creation of this note. Despite my best effort at editing the text, some misspelling/errors may have occurred.  Orders Placed This Encounter  Procedures  . CBC with Differential (Cancer Center Only)    Standing Status:   Future    Standing Expiration Date:   07/24/2018  . CMP ([facility] only)    Standing Status:   Future    Standing Expiration Date:   07/24/2018    Cancer Staging No matching staging information was found for the patient.  All questions were answered.  . The patient knows to call the clinic with any problems, questions or concerns.  This note was electronically signed.    History of Presenting Illness Timothy Mckinney is a 67 y.o. male followed in to the [facility] for thrombocytopenia evaluation, referred by Dr [name].  Review of past medical history significant for hypertension and alcohol abuse.  Currently, patient reports still drinking 1-2 drinks intermittently.  Denies daily alcohol use.  Hematological trends reflected below.  Currently, patient denies any nausea, vomiting, fevers, chills, night sweats.  Denies any abdominal pain or early satiety.  No diarrhea or constipation.  No abdominal bloating.  Denies any chest pain, shortness of breath, or cough.  No easy bruisability, gum bleeding, epistaxis, hemoptysis, hematemesis, hematuria, hematochezia, or melena.  Patient returns to the clinic for continued hematological monitoring.  In the interim, patient denies any new symptoms since the last visit to the clinic.  He has been able to reduce his alcohol consumption significantly without severe withdrawal signs.  Oncological/hematological History: --Labs, 12/17/16: WBC 3.3, ANC 1.9, ALC 0.8, Mono 0.3, Hgb 14.2, MCV 107.7, MCH     ..., RDW     ..., Plt   91; TSH 2.89, Folate 4.0,  Vit B12 460; Elevated AST, tBili --Labs, 04/03/17: WBC 2.9, ANC 1.7, ALC  0.8, Mono 0.2, Hgb 13.3, MCV 108.5, MCH     ..., RDW 14.0, Plt   76 --Labs, 05/15/17: WBC 3.8, ANC 2.4, ALC 0.9, Mono 0.3, Hgb 13.8, MCV 107.9, MCH 36.4, RDW 14.1, Plt   75; tBili 3.4, AP 130, AST 97, ALT 30, LDH 228; Folate 18.1, Vit B12 628, Vit B6 1.8(down), Vit B1 107.8, MMA 158(wnl), Homocysteine 13(wnl) --[place] RtUQ, 05/20/17: Fatty liver infiltration, no evidence of cirrhosis.  Splenomegaly with spleen measuring 14.9 cm and volume of 1034 cm. --Labs, 06/12/17: WBC 3.4, ANC 2.1, ALC 0.8, Mono 0.8, Hgb 12.9, MCV 106.8, MCH 36.5, RDW 14.6, Plt   75 --BM Bx, 06/12/17: Pathology --hypercellular bone marrow with relative abundance of erythroid precursors.  No evidence of dyspoiesis or increase in blasts.  CytoGen & FISH -- normal. --Labs, 07/04/17: WBC 2.8, ANC 1.6, ALC 0.9, Mono 0.2, Hgb 12.3, MCV 108.5, MCH 36.1, RDW 15.0, Plt   56; tBili 4.6, AP 125, AST 100, ALT 26;  --Labs, 07/24/17: WBC 4.7, ANC 2.9, ALC 1.2, Mono 0.4, Hgb 12.5, MCV 108.9, MCH 36.0, RDW 14.4, Plt 100; tBili 5.3, AP 123, AST 117, ALT 35, LDH 178; Vit B6 12.9   Medical History: Past Medical History:  Diagnosis Date  . Abnormal chest x-ray    ? granulomata  . Arthritis    knees and back   . DDD (degenerative disc disease), lumbar    MRI 08/2005  . Dry eyes, bilateral    Dr Bing Plume  . Elevated liver enzymes    HISTORY OF ELEVATED LIVER ENZYMES  . GERD (gastroesophageal reflux disease)    hx of   . Hypertension    NEW DIAGNOSIS AND STARTED ON METOPROLOL ON 10/04/11  . Hyperuricemia   . 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 Nephrolithiasis    Dr Serita Butcher  . Sleep apnea    STOP BANG SCORE OF 4  . Thrombocytopenia ([place])    HX OF LOW PLATELET COUNT    Surgical History: Past Surgical History:  Procedure Laterality Date  . ANAL FISTULECTOMY  06/01/2012   Procedure: FISTULECTOMY ANAL;  Surgeon: Leighton Ruff, MD;  Location: [facility];  Service: General;  Laterality: N/A;  Mucosal Advancement Flap   . CARDIAC  CATHETERIZATION     in 40s, negative  . colon ulcer  2011  . COLONOSCOPY W/ POLYPECTOMY  2011   Dr Sharlett Iles  . KNEE ARTHROSCOPY  2009    L  . KNEE SURGERY     april 2013 total knee replacement removed  . LITHOTRIPSY    . tear duct plugs    . TOTAL KNEE ARTHROPLASTY  2011   Workman's Comp case, Dr Veverly Fells    Family History: Family History  Problem Relation Age of Onset  . Heart attack Father 45  . Colon polyps Mother   . Dementia Mother        died post SDH  . Cancer Paternal Grandfather        throat , pipe smoker  . Colon polyps Sister   . Cancer Sister        Colon, stage 2  . Stroke Neg Hx     Social History: Social History   Socioeconomic History  . Marital status: Married    Spouse name: Not on file  . Number of children: Not on file  . Years of education: Not on file  . Highest education level: Not on file  Social Needs  . Financial resource strain: Not on file  . Food insecurity - worry: Not on file  . Food insecurity - inability: Not on file  . Transportation needs - medical: Not on file  . Transportation needs - non-medical: Not on file  Occupational History  . Not on file  Tobacco Use  . Smoking status: Never Smoker  . Smokeless tobacco: Never Used  Substance and Sexual Activity  . Alcohol use: Yes    Alcohol/week: 16.8 oz    Types: 14 Standard drinks or equivalent, 4 Cans of beer, 10 Shots of liquor per week    Comment: 4 beer and 10 liquor glasses per week  . Drug use: No  . Sexual activity: Not on file  Other Topics Concern  . Not on file  Social History Narrative  . Not on file    Allergies: Allergies  Allergen Reactions  . Penicillins Swelling    Diffuse swelling    Medications:  Current Outpatient Medications  Medication Sig Dispense Refill  . cycloSPORINE (RESTASIS) 0.05 % ophthalmic emulsion Place 1 drop into both eyes daily.    . diphenhydrAMINE (BENADRYL) 25 MG tablet Take 25 mg by mouth. ONE AT BEDTIME IF NEEDED SLEEP     . FOLIC ACID PO Take by mouth.    . Loperamide HCl (IMODIUM PO) Take by mouth daily.    . metoprolol succinate (TOPROL-XL) 50 MG 24 hr tablet take 1 tablet by mouth once daily 90 tablet 1  . sildenafil (REVATIO) 20 MG tablet TAKE 2-5 TABLETS BY MOUTH 1 HOUR BEFORE AS NEEDED  11   No current facility-administered medications for this visit.     Review of Systems: Review of Systems  All other systems reviewed and are negative.    PHYSICAL EXAMINATION Blood pressure (!) 128/46, pulse 68, temperature 98.1 F (36.7 C), temperature source Oral, resp. rate 18, weight 281 lb 12.8 oz (127.8 kg), SpO2 99 %.  ECOG PERFORMANCE STATUS: 1 - Symptomatic but completely ambulatory  Physical Exam  Constitutional: He is oriented to person, place, and time and well-developed, well-nourished, and in no distress. No distress.  HENT:  Head: Normocephalic and atraumatic.  Mouth/Throat: Oropharynx is clear and moist. No oropharyngeal exudate.  Eyes: Conjunctivae and EOM are normal. Pupils are equal, round, and reactive to light. No scleral icterus.  Neck: No thyromegaly present.  Cardiovascular: Normal rate, regular rhythm and normal heart sounds.  No murmur heard. Pulmonary/Chest: Effort normal and breath sounds normal. No respiratory distress. He has no wheezes. He has no rales. He exhibits no tenderness.  Abdominal: Soft. Bowel sounds are normal. He exhibits no distension. There is no tenderness. There is no rebound and no guarding.  Musculoskeletal: He exhibits no edema.  Lymphadenopathy:    He has no cervical adenopathy.  Neurological: He is alert and oriented to person, place, and time. He has normal reflexes. No cranial nerve deficit.  Skin: Skin is warm. No rash noted. He is not diaphoretic. No erythema. No pallor.     LABORATORY DATA: I have personally reviewed the data as listed: Appointment on 07/24/2017  Component Date Value Ref Range Status  . Vitamin B6 07/24/2017 12.9  5.3 - 46.7  ug/L Final   Comment: (NOTE) This test was developed and its performance characteristics determined by [facility]. It has not been cleared or approved by the Food and Drug Administration. Performed At: [facility] [place], [place] [id number] Rush Farmer MD [id number] Performed at [facility], [place] [address], [place], [place] [address]   . Ferritin 07/24/2017 375* 22 - 316 ng/mL Final   Performed at [facility], [place] [address], [place], [place] [address]  . Iron 07/24/2017 162  42 - 163 ug/dL Final  . TIBC 07/24/2017 155* 202 - 409 ug/dL Final  . Saturation Ratios 07/24/2017 UNABLE TO CALCULATE  42 - 163 % Final  . UIBC 07/24/2017 UNABLE TO CALCULATE  ug/dL Final   Performed at [facility], [place] [address], [place], [place] [address]  . LDH 07/24/2017 178  125 - 245 U/L Final   Performed at [facility], [place] [address], [place], [place] [address]  . Sodium 07/24/2017 139  136 - 145 mmol/L Final  . Potassium 07/24/2017 4.4  3.5 - 5.1 mmol/L Final  . Chloride 07/24/2017 104  98 - 109 mmol/L Final  . CO2 07/24/2017 28  22 - 29 mmol/L Final  . Glucose, Bld 07/24/2017 121  70 - 140 mg/dL Final  . BUN 07/24/2017 13  7 - 26 mg/dL Final  . Creatinine, Ser 07/24/2017 0.81  0.70 - 1.30 mg/dL Final  . Calcium 07/24/2017 8.9  8.4 - 10.4 mg/dL Final  . Total Protein 07/24/2017 6.6  6.4 - 8.3 g/dL Final  . Albumin 07/24/2017 2.9* 3.5 - 5.0 g/dL Final  . AST 07/24/2017 117* 5 - 34 U/L Final  . ALT 07/24/2017 35  0 - 55 U/L Final  . Alkaline Phosphatase 07/24/2017 123  40 - 150 U/L Final  . Total Bilirubin 07/24/2017 5.3* 0.2 - 1.2 mg/dL Final  . GFR calc non Af Amer 07/24/2017 >60  >60 mL/min Final  . GFR calc Af Amer 07/24/2017 >60  >60 mL/min Final   Comment: (NOTE) The eGFR has been calculated using the CKD EPI equation. This calculation has not been validated in  all clinical situations. eGFR's persistently <60 mL/min signify possible Chronic Kidney Disease.   Georgiann Hahn gap 07/24/2017 7  3 - 11 Final   Performed at [facility], [place] [address], [place], [place] [address]  . WBC Count 07/24/2017 4.7  4.0 - 10.3 K/uL Final  . RBC 07/24/2017 3.47* 4.20 - 5.82 MIL/uL Final  . Hemoglobin 07/24/2017 12.5* 13.0 - 17.1 g/dL Final  . HCT 07/24/2017 37.8* 38.4 - 49.9 % Final  . MCV 07/24/2017 108.9* 79.3 - 98.0 fL Final  . MCH 07/24/2017 36.0* 27.2 - 33.4 pg Final  . MCHC 07/24/2017 33.1  32.0 - 36.0 g/dL Final  . RDW 07/24/2017 14.4  11.0 - 15.6 % Final  . Platelet Count 07/24/2017 100* 140 - 400 K/uL Final  . Neutrophils Relative % 07/24/2017 62  % Final  . Neutro Abs 07/24/2017 2.9  1.5 - 6.5 K/uL Final  . Lymphocytes Relative 07/24/2017 25  % Final  . Lymphs Abs 07/24/2017 1.2  0.9 - 3.3 K/uL Final  . Monocytes Relative 07/24/2017 8  % Final  . Monocytes Absolute 07/24/2017 0.4  0.1 - 0.9 K/uL Final  . Eosinophils Relative 07/24/2017 5  % Final  . Eosinophils Absolute 07/24/2017 0.2  0.0 - 0.5 K/uL Final  . Basophils Relative 07/24/2017 0  % Final  . Basophils Absolute 07/24/2017 0.0  0.0 - 0.1 K/uL Final   Performed at [facility], [place] [address] [place], [place] [address]  . Retic Ct Pct 07/24/2017 2.9* 0.8 - 1.8 % Final  . RBC.  07/24/2017 3.47* 4.20 - 5.82 MIL/uL Final  . Retic Count, Absolute 07/24/2017 100.6* 34.8 - 93.9 K/uL Final   Performed at [facility], [place] [address], [place], [place] [address]       Ardath Sax, MD

## 2017-09-20 ENCOUNTER — Other Ambulatory Visit: Payer: Self-pay | Admitting: Family Medicine

## 2017-09-20 DIAGNOSIS — I10 Essential (primary) hypertension: Secondary | ICD-10-CM

## 2017-10-03 ENCOUNTER — Telehealth: Payer: Self-pay | Admitting: Hematology and Oncology

## 2017-10-03 NOTE — Telephone Encounter (Signed)
Call day - moved 4/24 appointments to 4/23. Spoke with patient he is aware.

## 2017-10-06 ENCOUNTER — Ambulatory Visit: Payer: Self-pay | Admitting: *Deleted

## 2017-10-06 NOTE — Telephone Encounter (Signed)
Please advise 

## 2017-10-06 NOTE — Telephone Encounter (Signed)
Pt called with some c/o swelling in his legs over the weekend. He stated that left one was bigger than the right one. He denies pitting edema, pain, fever or redness to either leg. He elevates his legs as needed. He states this happens about every 6 or 7 months, lasts for about 2 or 3 days and goes away.  No protocol. Home care advice given to him to keep his legs elevated when they start to swell. Call if difficulty breathing, pain, fever and redness to legs He is requesting a call back. He wanted to make his pcp aware that it happened and did not want an office visit.  Pt also had a question regarding Zyrtec. He states that he takes it in the morning but it interferes with his sleep. He is wanting to know if there is anything else he can take . He is requesting a call back please.  Answer Assessment - Initial Assessment Questions 1. ONSET: "When did the swelling start?" (e.g., minutes, hours, days)     Started on Friday 2. LOCATION: "What part of the leg is swollen?"  "Are both legs swollen or just one leg?"     From the knee down, the left greater than the right one 3. SEVERITY: "How bad is the swelling?" (e.g., localized; mild, moderate, severe)  - Localized - small area of swelling localized to one leg  - MILD pedal edema - swelling limited to foot and ankle, pitting edema < 1/4 inch (6 mm) deep, rest and elevation eliminate most or all swelling  - MODERATE edema - swelling of lower leg to knee, pitting edema > 1/4 inch (6 mm) deep, rest and elevation only partially reduce swelling  - SEVERE edema - swelling extends above knee, facial or hand swelling present      Moderate edema but now down 4. REDNESS: "Does the swelling look red or infected?"     no 5. PAIN: "Is the swelling painful to touch?" If so, ask: "How painful is it?"   (Scale 1-10; mild, moderate or severe)     no 6. FEVER: "Do you have a fever?" If so, ask: "What is it, how was it measured, and when did it start?"      no 7.  CAUSE: "What do you think is causing the leg swelling?"     no 8. MEDICAL HISTORY: "Do you have a history of heart failure, kidney disease, liver failure, or cancer?"     no 9. RECURRENT SYMPTOM: "Have you had leg swelling before?" If so, ask: "When was the last time?" "What happened that time?"     Had left knee replaced twice. The last time was about 6 or 7 months ago 10. OTHER SYMPTOMS: "Do you have any other symptoms?" (e.g., chest pain, difficulty breathing)       no 11. PREGNANCY: "Is there any chance you are pregnant?" "When was your last menstrual period?"       no  Protocols used: LEG SWELLING AND EDEMA-A-AH

## 2017-10-06 NOTE — Telephone Encounter (Signed)
Pt can try switching to Claritin if the Zyrtec is bothering him.  Or he can take the Zyrtec at night. I agree that is the leg swelling doesn't improve, worsens, or any other concerns, he needs to schedule a visit

## 2017-10-06 NOTE — Telephone Encounter (Signed)
Patient notified of PCP recommendations and is agreement and expresses an understanding.   Ok for PEC to Discuss results / PCP recommendations / Schedule patient.   

## 2017-10-21 ENCOUNTER — Other Ambulatory Visit: Payer: Self-pay

## 2017-10-21 ENCOUNTER — Inpatient Hospital Stay: Payer: PPO

## 2017-10-21 ENCOUNTER — Encounter: Payer: Self-pay | Admitting: Hematology and Oncology

## 2017-10-21 ENCOUNTER — Telehealth: Payer: Self-pay | Admitting: Hematology and Oncology

## 2017-10-21 ENCOUNTER — Inpatient Hospital Stay: Payer: PPO | Attending: Hematology and Oncology | Admitting: Hematology and Oncology

## 2017-10-21 VITALS — BP 134/56 | HR 72 | Temp 98.1°F | Resp 18 | Ht 71.0 in | Wt 274.3 lb

## 2017-10-21 DIAGNOSIS — F1010 Alcohol abuse, uncomplicated: Secondary | ICD-10-CM

## 2017-10-21 DIAGNOSIS — K760 Fatty (change of) liver, not elsewhere classified: Secondary | ICD-10-CM | POA: Diagnosis not present

## 2017-10-21 DIAGNOSIS — M5136 Other intervertebral disc degeneration, lumbar region: Secondary | ICD-10-CM | POA: Diagnosis not present

## 2017-10-21 DIAGNOSIS — R231 Pallor: Secondary | ICD-10-CM

## 2017-10-21 DIAGNOSIS — Z79899 Other long term (current) drug therapy: Secondary | ICD-10-CM | POA: Diagnosis not present

## 2017-10-21 DIAGNOSIS — G4730 Sleep apnea, unspecified: Secondary | ICD-10-CM | POA: Diagnosis not present

## 2017-10-21 DIAGNOSIS — Z800 Family history of malignant neoplasm of digestive organs: Secondary | ICD-10-CM

## 2017-10-21 DIAGNOSIS — K219 Gastro-esophageal reflux disease without esophagitis: Secondary | ICD-10-CM | POA: Diagnosis not present

## 2017-10-21 DIAGNOSIS — D61818 Other pancytopenia: Secondary | ICD-10-CM

## 2017-10-21 DIAGNOSIS — I10 Essential (primary) hypertension: Secondary | ICD-10-CM | POA: Insufficient documentation

## 2017-10-21 DIAGNOSIS — R161 Splenomegaly, not elsewhere classified: Secondary | ICD-10-CM | POA: Diagnosis not present

## 2017-10-21 DIAGNOSIS — Z880 Allergy status to penicillin: Secondary | ICD-10-CM | POA: Diagnosis not present

## 2017-10-21 LAB — CBC WITH DIFFERENTIAL (CANCER CENTER ONLY)
Basophils Absolute: 0 10*3/uL (ref 0.0–0.1)
Basophils Relative: 1 %
EOS ABS: 0.2 10*3/uL (ref 0.0–0.5)
EOS PCT: 7 %
HCT: 32 % — ABNORMAL LOW (ref 38.4–49.9)
Hemoglobin: 10.9 g/dL — ABNORMAL LOW (ref 13.0–17.1)
LYMPHS ABS: 0.8 10*3/uL — AB (ref 0.9–3.3)
LYMPHS PCT: 31 %
MCH: 35.8 pg — AB (ref 27.2–33.4)
MCHC: 33.9 g/dL (ref 32.0–36.0)
MCV: 105.6 fL — AB (ref 79.3–98.0)
MONOS PCT: 9 %
Monocytes Absolute: 0.2 10*3/uL (ref 0.1–0.9)
Neutro Abs: 1.4 10*3/uL — ABNORMAL LOW (ref 1.5–6.5)
Neutrophils Relative %: 52 %
Platelet Count: 65 10*3/uL — ABNORMAL LOW (ref 140–400)
RBC: 3.03 MIL/uL — ABNORMAL LOW (ref 4.20–5.82)
RDW: 14.3 % (ref 11.0–14.6)
WBC Count: 2.6 10*3/uL — ABNORMAL LOW (ref 4.0–10.3)

## 2017-10-21 LAB — CMP (CANCER CENTER ONLY)
ALT: 20 U/L (ref 0–55)
AST: 49 U/L — AB (ref 5–34)
Albumin: 3.3 g/dL — ABNORMAL LOW (ref 3.5–5.0)
Alkaline Phosphatase: 89 U/L (ref 40–150)
Anion gap: 8 (ref 3–11)
BUN: 12 mg/dL (ref 7–26)
CHLORIDE: 102 mmol/L (ref 98–109)
CO2: 25 mmol/L (ref 22–29)
CREATININE: 0.84 mg/dL (ref 0.70–1.30)
Calcium: 9.3 mg/dL (ref 8.4–10.4)
GFR, Est AFR Am: >60 mL/min (ref >60)
GFR, Estimated: >60 mL/min (ref >60)
Glucose, Bld: 125 mg/dL (ref 70–140)
Potassium: 4 mmol/L (ref 3.5–5.1)
SODIUM: 135 mmol/L — AB (ref 136–145)
Total Bilirubin: 3.3 mg/dL — ABNORMAL HIGH (ref 0.2–1.2)
Total Protein: 6.6 g/dL (ref 6.4–8.3)

## 2017-10-21 LAB — SEDIMENTATION RATE: Sed Rate: 33 mm/hr — ABNORMAL HIGH (ref 0–16)

## 2017-10-21 LAB — C-REACTIVE PROTEIN

## 2017-10-21 NOTE — Telephone Encounter (Signed)
Appointments scheduled AVS/Calendar printed per 4/23 los °

## 2017-10-22 ENCOUNTER — Other Ambulatory Visit: Payer: PPO

## 2017-10-22 ENCOUNTER — Ambulatory Visit: Payer: PPO | Admitting: Hematology and Oncology

## 2017-10-22 LAB — C3 AND C4
C3 COMPLEMENT: 101 mg/dL (ref 82–167)
Complement C4, Body Fluid: 12 mg/dL — ABNORMAL LOW (ref 14–44)

## 2017-10-22 LAB — ANCA TITERS
Atypical P-ANCA titer: <1:20 {titer}
C-ANCA: <1:20 {titer}
P-ANCA: <1:20 {titer}

## 2017-10-22 LAB — ANTINUCLEAR ANTIBODIES, IFA: ANA Ab, IFA: NEGATIVE

## 2017-10-23 DIAGNOSIS — M5136 Other intervertebral disc degeneration, lumbar region: Secondary | ICD-10-CM | POA: Diagnosis not present

## 2017-10-28 DIAGNOSIS — N5201 Erectile dysfunction due to arterial insufficiency: Secondary | ICD-10-CM | POA: Diagnosis not present

## 2017-10-28 DIAGNOSIS — R351 Nocturia: Secondary | ICD-10-CM | POA: Diagnosis not present

## 2017-10-28 DIAGNOSIS — N200 Calculus of kidney: Secondary | ICD-10-CM | POA: Diagnosis not present

## 2017-11-06 DIAGNOSIS — M5136 Other intervertebral disc degeneration, lumbar region: Secondary | ICD-10-CM | POA: Diagnosis not present

## 2017-11-06 DIAGNOSIS — M5137 Other intervertebral disc degeneration, lumbosacral region: Secondary | ICD-10-CM | POA: Diagnosis not present

## 2017-11-09 NOTE — Progress Notes (Signed)
[name] Follow-up Visit:  Assessment: Other pancytopenia ([place]) 67 y.o.-year-old male with history of alcohol abuse, and possible ongoing alcohol abuse referred to our clinic for thrombocytopenia.  Review of hematological profile reveals macrocytic progressive anemia, progressive thrombocytopenia and leukopenia.  Our additional evaluation confirmed presence of persistent leukopenia with mild neutropenia, mild anemia with pronounced macrocytosis and hypochromia, and progressive thrombocytopenia.  At the same time, significant elevation of bilirubin was discovered accompanied by elevation of AST more than twice the elevation of ALT consistent with alcohol injury to the liver.  Ultrasound of the abdomen demonstrated splenomegaly, fatty liver possibly contributing to portal hypertension resulting in splenomegaly.  Bone marrow biopsy was obtained with results demonstrating hypercellular bone marrow without dysmorphic features to demonstrate presence of MDS or AML.  Cytogenetics and molecular analysis are also normal.  Patient is currently drinking 4-6 drinks of alcohol per day in the form of 2-3 glasses of vodka.  This is likely the precipitating cause for both his hepatic abnormalities as the pattern of injury is telling.  In addition, this also serves as a direct toxin to the bone marrow suppressing normal bone marrow functioning.  additional lab work demonstrates deficiency in vitamin B6.  Patient appears to continue adhering to reduced alcohol consumption that has been discussed previously.  Continues to take vitamin supplementation as previously indicated.  Nevertheless, interval drop in some of the hematological parameters as outlined below.  The only new finding is livedo reticularis of bilateral lower extremities.  This may have something to with chronic liver disease related to alcohol consumption, but also may indicate presence of concurrent autoimmune conditions such as  lupus.  Plan: -Additional labs as outlined below. -Continue vitamin B supplementation - Return to clinic in 3 months for continued hematological monitoring.   Voice recognition software was used and creation of this note. Despite my best effort at editing the text, some misspelling/errors may have occurred.  Orders Placed This Encounter  Procedures  . ANCA Titers    Standing Status:   Future    Number of Occurrences:   1    Standing Expiration Date:   10/21/2018  . ANA, IFA (with reflex)    Standing Status:   Future    Number of Occurrences:   1    Standing Expiration Date:   10/21/2018  . C3 and C4    Standing Status:   Future    Number of Occurrences:   1    Standing Expiration Date:   10/22/2018  . C-reactive protein  . Sedimentation rate  . CBC with Differential (Cancer Center Only)    Standing Status:   Future    Standing Expiration Date:   10/22/2018  . CMP ([facility] only)    Standing Status:   Future    Standing Expiration Date:   10/22/2018  . Lactate dehydrogenase (LDH)    Standing Status:   Future    Standing Expiration Date:   10/21/2018  . Folate, Serum    Standing Status:   Future    Standing Expiration Date:   10/21/2018  . Vitamin B12    Standing Status:   Future    Standing Expiration Date:   10/21/2018    Cancer Staging No matching staging information was found for the patient.  All questions were answered.  . The patient knows to call the clinic with any problems, questions or concerns.  This note was electronically signed.    History of Presenting Illness Timothy  TRAVER Mckinney is a 67 y.o. male followed in to the [facility] for thrombocytopenia evaluation, referred by Dr [name].  Review of past medical history significant for hypertension and alcohol abuse.  Currently, patient reports still drinking 1-2 drinks intermittently.  Denies daily alcohol use.  Hematological trends reflected below.  Currently, patient denies any nausea, vomiting,  fevers, chills, night sweats.  Denies any abdominal pain or early satiety.  No diarrhea or constipation.  No abdominal bloating.  Denies any chest pain, shortness of breath, or cough.  No easy bruisability, gum bleeding, epistaxis, hemoptysis, hematemesis, hematuria, hematochezia, or melena.  Patient returns to the clinic for continued hematological monitoring.  In the interim, patient has been able to reduce his alcohol use by 80% reportedly not consuming more than 1 drink per day and proportionately drinking less hard liquor.  Patient reports poor appetite and decrease in the oral intake.  Also complains of increased nocturia with 6-7 urinations per night.  Additionally, complains of new discoloration of bilateral lower extremities.  Oncological/hematological History: --Labs, 12/17/16: WBC 3.3, ANC 1.9, ALC 0.8, Mono 0.3, Hgb 14.2, MCV 107.7, MCH     ..., RDW     ..., Plt   91; TSH 2.89, Folate 4.0, Vit B12 460; Elevated AST, tBili --Labs, 04/03/17: WBC 2.9, ANC 1.7, ALC 0.8, Mono 0.2, Hgb 13.3, MCV 108.5, MCH     ..., RDW 14.0, Plt   76 --Labs, 05/15/17: WBC 3.8, ANC 2.4, ALC 0.9, Mono 0.3, Hgb 13.8, MCV 107.9, MCH 36.4, RDW 14.1, Plt   75; tBili 3.4, AP 130, AST 97, ALT 30, LDH 228; Folate 18.1, Vit B12 628, Vit B6 1.8(down), Vit B1 107.8, MMA 158(wnl), Homocysteine 13(wnl) --[place] RtUQ, 05/20/17: Fatty liver infiltration, no evidence of cirrhosis.  Splenomegaly with spleen measuring 14.9 cm and volume of 1034 cm. --Labs, 06/12/17: WBC 3.4, ANC 2.1, ALC 0.8, Mono 0.8, Hgb 12.9, MCV 106.8, MCH 36.5, RDW 14.6, Plt   75 --BM Bx, 06/12/17: Pathology --hypercellular bone marrow with relative abundance of erythroid precursors.  No evidence of dyspoiesis or increase in blasts.  CytoGen & FISH -- normal. --Labs, 07/04/17: WBC 2.8, ANC 1.6, ALC 0.9, Mono 0.2, Hgb 12.3, MCV 108.5, MCH 36.1, RDW 15.0, Plt   56; tBili 4.6, AP 125, AST 100, ALT 26;  --Labs, 07/24/17: WBC 4.7, ANC 2.9, ALC 1.2, Mono 0.4, Hgb 12.5,  MCV 108.9, MCH 36.0, RDW 14.4, Plt 100; tBili 5.3, AP 123, AST 117, ALT 35, LDH 178; Vit B6 12.9 --Labs, 10/21/17: WBC 2.6, ANC 1.4, ALC 0.8, Mono 0.2, Hgb 10.9, MCV 105.6, MCH 35.8, RDW 14.3, Plt   65;   Medical History: Past Medical History:  Diagnosis Date  . Abnormal chest x-ray    ? granulomata  . Arthritis    knees and back   . DDD (degenerative disc disease), lumbar    MRI 08/2005  . Dry eyes, bilateral    Dr Bing Plume  . Elevated liver enzymes    HISTORY OF ELEVATED LIVER ENZYMES  . GERD (gastroesophageal reflux disease)    hx of   . Hypertension    NEW DIAGNOSIS AND STARTED ON METOPROLOL ON 10/04/11  . Hyperuricemia   . 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 Nephrolithiasis    Dr Serita Butcher  . Sleep apnea    STOP BANG SCORE OF 4  . Thrombocytopenia ([place])    HX OF LOW PLATELET COUNT    Surgical History: Past Surgical History:  Procedure Laterality Date  . ANAL FISTULECTOMY  06/01/2012   Procedure: FISTULECTOMY ANAL;  Surgeon: Leighton Ruff, MD;  Location: [facility];  Service: General;  Laterality: N/A;  Mucosal Advancement Flap   . CARDIAC CATHETERIZATION     in 40s, negative  . colon ulcer  2011  . COLONOSCOPY W/ POLYPECTOMY  2011   Dr Sharlett Iles  . KNEE ARTHROSCOPY  2009    L  . KNEE SURGERY     april 2013 total knee replacement removed  . LITHOTRIPSY    . tear duct plugs    . TOTAL KNEE ARTHROPLASTY  2011   Workman's Comp case, Dr Veverly Fells    Family History: Family History  Problem Relation Age of Onset  . Heart attack Father 63  . Colon polyps Mother   . Dementia Mother        died post SDH  . Cancer Paternal Grandfather        throat , pipe smoker  . Colon polyps Sister   . Cancer Sister        Colon, stage 2  . Stroke Neg Hx     Social History: Social History   Socioeconomic History  . Marital status: Married    Spouse name: Not on file  . Number of children: Not on file  . Years of education: Not on file  . Highest education level: Not  on file  Occupational History  . Not on file  Social Needs  . Financial resource strain: Not on file  . Food insecurity:    Worry: Not on file    Inability: Not on file  . Transportation needs:    Medical: Not on file    Non-medical: Not on file  Tobacco Use  . Smoking status: Never Smoker  . Smokeless tobacco: Never Used  Substance and Sexual Activity  . Alcohol use: Yes    Alcohol/week: 16.8 oz    Types: 14 Standard drinks or equivalent, 4 Cans of beer, 10 Shots of liquor per week    Comment: 4 beer and 10 liquor glasses per week  . Drug use: No  . Sexual activity: Not on file  Lifestyle  . Physical activity:    Days per week: Not on file    Minutes per session: Not on file  . Stress: Not on file  Relationships  . Social connections:    Talks on phone: Not on file    Gets together: Not on file    Attends religious service: Not on file    Active member of club or organization: Not on file    Attends meetings of clubs or organizations: Not on file    Relationship status: Not on file  . Intimate partner violence:    Fear of current or ex partner: Not on file    Emotionally abused: Not on file    Physically abused: Not on file    Forced sexual activity: Not on file  Other Topics Concern  . Not on file  Social History Narrative  . Not on file    Allergies: Allergies  Allergen Reactions  . Penicillins Swelling    Diffuse swelling    Medications:  Current Outpatient Medications  Medication Sig Dispense Refill  . cycloSPORINE (RESTASIS) 0.05 % ophthalmic emulsion Place 1 drop into both eyes daily.    . diphenhydrAMINE (BENADRYL) 25 MG tablet Take 25 mg by mouth. ONE AT BEDTIME IF NEEDED SLEEP    . FOLIC ACID PO Take by mouth.    . Loperamide HCl (IMODIUM PO)  Take by mouth daily.    . meloxicam (MOBIC) 15 MG tablet TAKE 1 TABLET BY MOUTH ONCE DAILY 90 tablet 0  . metoprolol succinate (TOPROL-XL) 50 MG 24 hr tablet TAKE 1 TABLET BY MOUTH ONCE DAILY 90 tablet 0  .  sildenafil (REVATIO) 20 MG tablet TAKE 2-5 TABLETS BY MOUTH 1 HOUR BEFORE AS NEEDED  11   No current facility-administered medications for this visit.     Review of Systems: Review of Systems  All other systems reviewed and are negative.    PHYSICAL EXAMINATION Blood pressure (!) 134/56, pulse 72, temperature 98.1 F (36.7 C), temperature source Oral, resp. rate 18, height '5\' 11"'  (1.803 m), weight 274 lb 4.8 oz (124.4 kg), SpO2 100 %.  ECOG PERFORMANCE STATUS: 1 - Symptomatic but completely ambulatory  Physical Exam  Constitutional: He is oriented to person, place, and time and well-developed, well-nourished, and in no distress. No distress.  HENT:  Head: Normocephalic and atraumatic.  Mouth/Throat: Oropharynx is clear and moist. No oropharyngeal exudate.  Eyes: Pupils are equal, round, and reactive to light. Conjunctivae and EOM are normal. No scleral icterus.  Neck: No thyromegaly present.  Cardiovascular: Normal rate, regular rhythm and normal heart sounds.  No murmur heard. Pulmonary/Chest: Effort normal and breath sounds normal. No respiratory distress. He has no wheezes. He has no rales. He exhibits no tenderness.  Abdominal: Soft. Bowel sounds are normal. He exhibits no distension. There is no tenderness. There is no rebound and no guarding.  Musculoskeletal: He exhibits no edema.  Lymphadenopathy:    He has no cervical adenopathy.  Neurological: He is alert and oriented to person, place, and time. He has normal reflexes. No cranial nerve deficit.  Skin: Skin is warm. Rash noted. He is not diaphoretic. No erythema. No pallor.  Bilateral lower extremity livedo reticularis     LABORATORY DATA: I have personally reviewed the data as listed: Appointment on 10/21/2017  Component Date Value Ref Range Status  . C3 Complement 10/21/2017 101  82 - 167 mg/dL Final  . Complement C4, Body Fluid 10/21/2017 12* 14 - 44 mg/dL Final   Comment: (NOTE) Performed At: [facility] [place], [place] [id number] Rush Farmer MD [id number] Performed at [facility], [place] [address], [place], [place] [address]   . ANA Ab, IFA 10/21/2017 Negative   Final   Comment: (NOTE)                                     Negative   <1:80                                     Borderline  1:80                                     Positive   >1:80 Performed At: [facility] [place], [place] [id number] Rush Farmer MD [id number] Performed at [facility], [place] [address] [place], [place] [address]   . C-ANCA 10/21/2017 <1:20  Neg:<1:20 titer Final  . P-ANCA 10/21/2017 <1:20  Neg:<1:20 titer Final   Comment: (NOTE) The presence of positive fluorescence exhibiting P-ANCA or C-ANCA patterns alone is not specific for the diagnosis  of Wegener's Granulomatosis (WG) or microscopic polyangiitis. Decisions about treatment should not be based solely on ANCA IFA results.  The International ANCA Group Consensus recommends follow up testing of positive sera with both PR-3 and MPO-ANCA enzyme immunoassays. As many as 5% serum samples are positive only by EIA. Ref. AM J Clin Pathol 1999;111:507-513.   Marland Kitchen Atypical P-ANCA titer 10/21/2017 <1:20  Neg:<1:20 titer Final   Comment: (NOTE) The atypical pANCA pattern has been observed in a significant percentage of patients with ulcerative colitis, primary sclerosing cholangitis and autoimmune hepatitis. Performed At: [facility] [place], [place] [id number] Rush Farmer MD [id number] Performed at [facility], [place] [address] [place], [place] [address]   Office Visit on 10/21/2017  Component Date Value Ref Range Status  . CRP 10/21/2017 <0.8  <1.0 mg/dL Final   Performed at [place] [address], [place], [place] [address]  . Sed Rate 10/21/2017 33* 0 - 16 mm/hr Final    Performed at [facility], [place] [address] [place], [place] [address]  Appointment on 10/21/2017  Component Date Value Ref Range Status  . Sodium 10/21/2017 135* 136 - 145 mmol/L Final  . Potassium 10/21/2017 4.0  3.5 - 5.1 mmol/L Final  . Chloride 10/21/2017 102  98 - 109 mmol/L Final  . CO2 10/21/2017 25  22 - 29 mmol/L Final  . Glucose, Bld 10/21/2017 125  70 - 140 mg/dL Final  . BUN 10/21/2017 12  7 - 26 mg/dL Final  . Creatinine 10/21/2017 0.84  0.70 - 1.30 mg/dL Final  . Calcium 10/21/2017 9.3  8.4 - 10.4 mg/dL Final  . Total Protein 10/21/2017 6.6  6.4 - 8.3 g/dL Final  . Albumin 10/21/2017 3.3* 3.5 - 5.0 g/dL Final  . AST 10/21/2017 49* 5 - 34 U/L Final  . ALT 10/21/2017 20  0 - 55 U/L Final  . Alkaline Phosphatase 10/21/2017 89  40 - 150 U/L Final  . Total Bilirubin 10/21/2017 3.3* 0.2 - 1.2 mg/dL Final  . GFR, Est Non Af Am 10/21/2017 >60  >60 mL/min Final  . GFR, Est AFR Am 10/21/2017 >60  >60 mL/min Final   Comment: (NOTE) The eGFR has been calculated using the CKD EPI equation. This calculation has not been validated in all clinical situations. eGFR's persistently <60 mL/min signify possible Chronic Kidney Disease.   Georgiann Hahn gap 10/21/2017 8  3 - 11 Final   Performed at [facility], [place] [address], [place],  [address]  . WBC Count 10/21/2017 2.6* 4.0 - 10.3 K/uL Final  . RBC 10/21/2017 3.03* 4.20 - 5.82 MIL/uL Final  . Hemoglobin 10/21/2017 10.9* 13.0 - 17.1 g/dL Final  . HCT 10/21/2017 32.0* 38.4 - 49.9 % Final  . MCV 10/21/2017 105.6* 79.3 - 98.0 fL Final  . MCH 10/21/2017 35.8* 27.2 - 33.4 pg Final  . MCHC 10/21/2017 33.9  32.0 - 36.0 g/dL Final  . RDW 10/21/2017 14.3  11.0 - 14.6 % Final  . Platelet Count 10/21/2017 65* 140 - 400 K/uL Final  . Neutrophils Relative % 10/21/2017 52  % Final  . Neutro Abs 10/21/2017 1.4* 1.5 - 6.5 K/uL Final  . Lymphocytes Relative 10/21/2017 31  % Final  . Lymphs Abs 10/21/2017  0.8* 0.9 - 3.3 K/uL Final  . Monocytes Relative 10/21/2017 9  % Final  . Monocytes Absolute 10/21/2017 0.2  0.1 - 0.9 K/uL Final  . Eosinophils Relative 10/21/2017  7  % Final  . Eosinophils Absolute 10/21/2017 0.2  0.0 - 0.5 K/uL Final  . Basophils Relative 10/21/2017 1  % Final  . Basophils Absolute 10/21/2017 0.0  0.0 - 0.1 K/uL Final   Performed at [facility], [place] [address], [place], [place] [address]       Ardath Sax, MD

## 2017-11-10 NOTE — Assessment & Plan Note (Signed)
67 y.o.-year-old male with history of alcohol abuse, and possible ongoing alcohol abuse referred to our clinic for thrombocytopenia.  Review of hematological profile reveals macrocytic progressive anemia, progressive thrombocytopenia and leukopenia.  Our additional evaluation confirmed presence of persistent leukopenia with mild neutropenia, mild anemia with pronounced macrocytosis and hypochromia, and progressive thrombocytopenia.  At the same time, significant elevation of bilirubin was discovered accompanied by elevation of AST more than twice the elevation of ALT consistent with alcohol injury to the liver.  Ultrasound of the abdomen demonstrated splenomegaly, fatty liver possibly contributing to portal hypertension resulting in splenomegaly.  Bone marrow biopsy was obtained with results demonstrating hypercellular bone marrow without dysmorphic features to demonstrate presence of MDS or AML.  Cytogenetics and molecular analysis are also normal.  Patient is currently drinking 4-6 drinks of alcohol per day in the form of 2-3 glasses of vodka.  This is likely the precipitating cause for both his hepatic abnormalities as the pattern of injury is telling.  In addition, this also serves as a direct toxin to the bone marrow suppressing normal bone marrow functioning.  additional lab work demonstrates deficiency in vitamin B6.  Patient appears to continue adhering to reduced alcohol consumption that has been discussed previously.  Continues to take vitamin supplementation as previously indicated.  Nevertheless, interval drop in some of the hematological parameters as outlined below.  The only new finding is livedo reticularis of bilateral lower extremities.  This may have something to with chronic liver disease related to alcohol consumption, but also may indicate presence of concurrent autoimmune conditions such as lupus.  Plan: -Additional labs as outlined below. -Continue vitamin B supplementation - Return  to clinic in 3 months for continued hematological monitoring.

## 2017-11-14 DIAGNOSIS — M25562 Pain in left knee: Secondary | ICD-10-CM | POA: Diagnosis not present

## 2017-11-14 DIAGNOSIS — S8002XA Contusion of left knee, initial encounter: Secondary | ICD-10-CM | POA: Diagnosis not present

## 2017-12-05 ENCOUNTER — Other Ambulatory Visit: Payer: Self-pay | Admitting: Family Medicine

## 2017-12-11 ENCOUNTER — Other Ambulatory Visit: Payer: Self-pay | Admitting: Family Medicine

## 2017-12-11 DIAGNOSIS — I10 Essential (primary) hypertension: Secondary | ICD-10-CM

## 2017-12-23 NOTE — Progress Notes (Addendum)
Subjective:   Timothy Mckinney is a 67 y.o. male who presents for Medicare Annual/Subsequent preventive examination.  Review of Systems:  No ROS.  Medicare Wellness Visit. Additional risk factors are reflected in the social history.  Cardiac Risk Factors include: advanced age (>62men, >31 women);male gender;hypertension;obesity (BMI >30kg/m2);sedentary lifestyle;family history of premature cardiovascular disease   Sleep patterns: Sleeps 7-8 hours.  Home Safety/Smoke Alarms: Feels safe in home. Smoke alarms in place.  Living environment; residence and Firearm Safety: Lives with wife in 2 story home.  Seat Belt Safety/Bike Helmet: Wears seat belt.    Male:   CCS-Colonoscopy 03/24/2017, polyps. Recall 5 years.      PSA-Followed by Urology Lab Results  Component Value Date   PSA 0.17 12/17/2016   PSA 0.16 12/15/2015       Objective:    Vitals: BP (!) 148/70 (BP Location: Left Arm, Patient Position: Sitting, Cuff Size: Normal)   Pulse 69   Temp 98.4 F (36.9 C) (Temporal)   Resp 18   Ht 5\' 11"  (1.803 m)   Wt 282 lb 2 oz (128 kg)   SpO2 97%   BMI 39.35 kg/m   Body mass index is 39.35 kg/m.  Advanced Directives 12/24/2017 06/12/2017 05/15/2017 12/17/2016 10/15/2014 10/15/2014 05/21/2012  Does Patient Have a Medical Advance Directive? Yes Yes Yes Yes Yes No Patient has advance directive, copy in chart  Type of Advance Directive [place];Living will [place];Living will Healthcare Power of [place];Living will [place];Living will - [place];Living will  Does patient want to make changes to medical advance directive? - No - Patient declined - - - - -  Copy of [place] in Chart? No - copy requested Yes - No - copy requested No - copy requested - -  Would patient like information on creating a medical advance directive? - - - - - No - patient declined  information -  Pre-existing out of facility DNR order (yellow form or pink MOST form) - - - - - - No    Tobacco Social History   Tobacco Use  Smoking Status Never Smoker  Smokeless Tobacco Never Used     Counseling given: Not Answered    Past Medical History:  Diagnosis Date  . Abnormal chest x-ray    ? granulomata  . Arthritis    knees and back   . DDD (degenerative disc disease), lumbar    MRI 08/2005  . Dry eyes, bilateral    Dr Bing Plume  . Elevated liver enzymes    HISTORY OF ELEVATED LIVER ENZYMES  . GERD (gastroesophageal reflux disease)    hx of   . Hypertension    NEW DIAGNOSIS AND STARTED ON METOPROLOL ON 10/04/11  . Hyperuricemia   . 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 Nephrolithiasis    Dr Serita Butcher  . Sleep apnea    STOP BANG SCORE OF 4  . Thrombocytopenia ([place])    HX OF LOW PLATELET COUNT   Past Surgical History:  Procedure Laterality Date  . ANAL FISTULECTOMY  06/01/2012   Procedure: FISTULECTOMY ANAL;  Surgeon: Leighton Ruff, MD;  Location: [facility];  Service: General;  Laterality: N/A;  Mucosal Advancement Flap   . CARDIAC CATHETERIZATION     in 40s, negative  . colon ulcer  2011  . COLONOSCOPY W/ POLYPECTOMY  2011   Dr Sharlett Iles  . KNEE ARTHROSCOPY  2009  L  . KNEE SURGERY     april 2013 total knee replacement removed  . LITHOTRIPSY    . tear duct plugs    . TOTAL KNEE ARTHROPLASTY  2011   Workman's Comp case, Dr Veverly Fells   Family History  Problem Relation Age of Onset  . Heart attack Father 37  . Colon polyps Mother   . Dementia Mother        died post SDH  . Cancer Paternal Grandfather        throat , pipe smoker  . Colon polyps Sister   . Cancer Sister        Colon, stage 2  . Hearing loss Brother   . Stroke Neg Hx    Social History   Socioeconomic History  . Marital status: Married    Spouse name: Not on file  . Number of children: Not on file  . Years of education: Not on file  . Highest education level: Not on  file  Occupational History  . Not on file  Social Needs  . Financial resource strain: Not on file  . Food insecurity:    Worry: Not on file    Inability: Not on file  . Transportation needs:    Medical: Not on file    Non-medical: Not on file  Tobacco Use  . Smoking status: Never Smoker  . Smokeless tobacco: Never Used  Substance and Sexual Activity  . Alcohol use: Yes    Alcohol/week: 16.8 oz    Types: 14 Standard drinks or equivalent, 4 Cans of beer, 10 Shots of liquor per week    Comment: 4 beer and 10 liquor glasses per week  . Drug use: No  . Sexual activity: Not on file  Lifestyle  . Physical activity:    Days per week: Not on file    Minutes per session: Not on file  . Stress: Not on file  Relationships  . Social connections:    Talks on phone: Not on file    Gets together: Not on file    Attends religious service: Not on file    Active member of club or organization: Not on file    Attends meetings of clubs or organizations: Not on file    Relationship status: Not on file  Other Topics Concern  . Not on file  Social History Narrative  . Not on file    Outpatient Encounter Medications as of 12/24/2017  Medication Sig  . cycloSPORINE (RESTASIS) 0.05 % ophthalmic emulsion Place 1 drop into both eyes daily.  . diphenhydrAMINE (BENADRYL) 25 MG tablet Take 25 mg by mouth. ONE AT BEDTIME IF NEEDED SLEEP  . meloxicam (MOBIC) 15 MG tablet TAKE 1 TABLET BY MOUTH ONCE DAILY  . metoprolol succinate (TOPROL-XL) 50 MG 24 hr tablet TAKE 1 TABLET BY MOUTH ONCE DAILY  . NONFORMULARY OR COMPOUNDED ITEM Take 2 drops by mouth 2 (two) times daily.  . sildenafil (REVATIO) 20 MG tablet TAKE 2-5 TABLETS BY MOUTH 1 HOUR BEFORE AS NEEDED  . clindamycin (CLEOCIN) 150 MG capsule TK 4 CAPSULES PO 1 H PRIOR TO APPT  . Loperamide HCl (IMODIUM PO) Take by mouth daily.  . [DISCONTINUED] FOLIC ACID PO Take by mouth.   No facility-administered encounter medications on file as of 12/24/2017.       Activities of Daily Living In your present state of health, do you have any difficulty performing the following activities: 12/24/2017 07/29/2017  Hearing? N N  Vision? N  N  Difficulty concentrating or making decisions? N N  Walking or climbing stairs? N N  Dressing or bathing? N N  Doing errands, shopping? N N  Preparing Food and eating ? N -  Using the Toilet? N -  In the past six months, have you accidently leaked urine? N -  Do you have problems with loss of bowel control? N -  Managing your Medications? N -  Managing your Finances? N -  Housekeeping or managing your Housekeeping? N -  Some recent data might be hidden    Patient Care Team: [name], MD as PCP - General (Family Medicine) Christy Sartorius, MD as Referring Physician (Urology) Noralee Space, MD as Consulting Physician (Pulmonary Disease) Juanita Craver, MD as Consulting Physician (Gastroenterology) Tamela Oddi, DDS as Consulting Physician (Oral Surgery)   Assessment:   This is a routine wellness examination for Dvaughn.  Exercise Activities and Dietary recommendations Current Exercise Habits: The patient does not participate in regular exercise at present, Exercise limited by: None identified   Diet (meal preparation, eat out, water intake, caffeinated beverages, dairy products, fruits and vegetables): Drinks water.   Breakfast: pastry Lunch: sandwich Dinner: protein and vegetables.   Decreased appetite with recent mucus production.   Goals    . Weight (lb) < 250 lb (113.4 kg)     Lose weight by controlling carb intake and snacking at night.     . Weight (lb) < 260 lb (117.9 kg)     Lose weight by watching caloric intake/breakfast pastries.        Fall Risk Fall Risk  12/24/2017 07/29/2017 12/17/2016 12/15/2015 08/09/2015  Falls in the past year? No No Yes No No  Number falls in past yr: - - 1 - -  Injury with Fall? - - No - -  Risk for fall due to : - - Impaired balance/gait - -   Follow up - - Falls prevention discussed - -    Depression Screen PHQ 2/9 Scores 12/24/2017 07/29/2017 03/06/2017 12/17/2016  PHQ - 2 Score 0 0 0 0  PHQ- 9 Score - 0 0 -  Exception Documentation - - - -    Cognitive Function MMSE - Mini Mental State Exam 12/24/2017  Orientation to time 5  Orientation to Place 5  Registration 2  Attention/ Calculation 5  Recall 3  Language- name 2 objects 2  Language- repeat 1  Language- follow 3 step command 3  Language- read & follow direction 1  Write a sentence 1  Copy design 1  Total score 29        Immunization History  Administered Date(s) Administered  . Pneumococcal Conjugate-13 08/05/2014  . Pneumococcal Polysaccharide-23 08/09/2015  . Tetanus 07/26/2008     Screening Tests Health Maintenance  Topic Date Due  . INFLUENZA VACCINE  03/31/2018 (Originally 01/29/2018)  . TETANUS/TDAP  07/26/2018  . COLONOSCOPY  03/25/2022  . Hepatitis C Screening  Completed  . PNA vac Low Risk Adult  Completed       Plan:    Bring a copy of your living will and/or healthcare power of attorney to your next office visit.  Continue doing brain stimulating activities (puzzles, reading, adult coloring books, staying active) to keep memory sharp.   I have personally reviewed and noted the following in the patient's chart:   . Medical and social history . Use of alcohol, tobacco or illicit drugs  . Current medications and supplements . Functional ability and status .  Nutritional status . Physical activity . Advanced directives . List of other physicians . Hospitalizations, surgeries, and ER visits in previous 12 months . Vitals . Screenings to include cognitive, depression, and falls . Referrals and appointments  In addition, I have reviewed and discussed with patient certain preventive protocols, quality metrics, and best practice recommendations. A written personalized care plan for preventive services as well as general preventive health  recommendations were provided to patient.     Gerilyn Nestle, RN  12/24/2017   Reviewed documentation provided by RN and agree w/ above.  [name], MD

## 2017-12-24 ENCOUNTER — Other Ambulatory Visit: Payer: Self-pay

## 2017-12-24 ENCOUNTER — Ambulatory Visit (INDEPENDENT_AMBULATORY_CARE_PROVIDER_SITE_OTHER): Payer: PPO

## 2017-12-24 ENCOUNTER — Encounter: Payer: Self-pay | Admitting: Family Medicine

## 2017-12-24 ENCOUNTER — Ambulatory Visit (INDEPENDENT_AMBULATORY_CARE_PROVIDER_SITE_OTHER): Payer: PPO | Admitting: Family Medicine

## 2017-12-24 VITALS — BP 138/88 | HR 69 | Temp 98.4°F | Resp 18 | Ht 71.0 in | Wt 282.1 lb

## 2017-12-24 VITALS — BP 148/70 | HR 69 | Temp 98.4°F | Resp 18 | Ht 71.0 in | Wt 282.1 lb

## 2017-12-24 DIAGNOSIS — Z0001 Encounter for general adult medical examination with abnormal findings: Secondary | ICD-10-CM | POA: Diagnosis not present

## 2017-12-24 DIAGNOSIS — I10 Essential (primary) hypertension: Secondary | ICD-10-CM

## 2017-12-24 DIAGNOSIS — R112 Nausea with vomiting, unspecified: Secondary | ICD-10-CM

## 2017-12-24 DIAGNOSIS — R17 Unspecified jaundice: Secondary | ICD-10-CM

## 2017-12-24 DIAGNOSIS — K709 Alcoholic liver disease, unspecified: Secondary | ICD-10-CM

## 2017-12-24 DIAGNOSIS — Z0000 Encounter for general adult medical examination without abnormal findings: Secondary | ICD-10-CM | POA: Diagnosis not present

## 2017-12-24 DIAGNOSIS — E669 Obesity, unspecified: Secondary | ICD-10-CM

## 2017-12-24 LAB — BASIC METABOLIC PANEL
BUN: 15 mg/dL (ref 6–23)
CO2: 31 meq/L (ref 19–32)
Calcium: 9.2 mg/dL (ref 8.4–10.5)
Chloride: 101 mEq/L (ref 96–112)
Creatinine, Ser: 1.02 mg/dL (ref 0.40–1.50)
GFR: 77 mL/min (ref >60.00)
GLUCOSE: 113 mg/dL — AB (ref 70–99)
Potassium: 5 mEq/L (ref 3.5–5.1)
Sodium: 137 mEq/L (ref 135–145)

## 2017-12-24 LAB — HEPATIC FUNCTION PANEL
ALK PHOS: 77 U/L (ref 39–117)
ALT: 18 U/L (ref 0–53)
AST: 55 U/L — ABNORMAL HIGH (ref 0–37)
Albumin: 3.6 g/dL (ref 3.5–5.2)
BILIRUBIN TOTAL: 5 mg/dL — AB (ref 0.2–1.2)
Bilirubin, Direct: 2 mg/dL — ABNORMAL HIGH (ref 0.0–0.3)
Total Protein: 6.5 g/dL (ref 6.0–8.3)

## 2017-12-24 LAB — CBC WITH DIFFERENTIAL/PLATELET
BASOS PCT: 1 % (ref 0.0–3.0)
Basophils Absolute: 0 10*3/uL (ref 0.0–0.1)
EOS PCT: 5.7 % — AB (ref 0.0–5.0)
Eosinophils Absolute: 0.1 10*3/uL (ref 0.0–0.7)
HCT: 32.7 % — ABNORMAL LOW (ref 39.0–52.0)
HEMOGLOBIN: 11.5 g/dL — AB (ref 13.0–17.0)
Lymphocytes Relative: 29.6 % (ref 12.0–46.0)
Lymphs Abs: 0.8 10*3/uL (ref 0.7–4.0)
MCHC: 35.1 g/dL (ref 30.0–36.0)
MCV: 105.2 fl — AB (ref 78.0–100.0)
MONOS PCT: 10.2 % (ref 3.0–12.0)
Monocytes Absolute: 0.3 10*3/uL (ref 0.1–1.0)
Neutro Abs: 1.4 10*3/uL (ref 1.4–7.7)
Neutrophils Relative %: 53.5 % (ref 43.0–77.0)
Platelets: 58 10*3/uL — ABNORMAL LOW (ref 150.0–400.0)
RBC: 3.11 Mil/uL — AB (ref 4.22–5.81)
RDW: 15.3 % (ref 11.5–15.5)
WBC: 2.6 10*3/uL — AB (ref 4.0–10.5)

## 2017-12-24 LAB — LIPID PANEL
CHOLESTEROL: 155 mg/dL (ref 0–200)
HDL: 37.7 mg/dL — AB (ref >39.00)
LDL CALC: 100 mg/dL — AB (ref 0–99)
NONHDL: 117.27
Total CHOL/HDL Ratio: 4
Triglycerides: 87 mg/dL (ref 0.0–149.0)
VLDL: 17.4 mg/dL (ref 0.0–40.0)

## 2017-12-24 LAB — AMMONIA: Ammonia: 45 umol/L — ABNORMAL HIGH (ref 11–35)

## 2017-12-24 LAB — TSH: TSH: 2.1 u[IU]/mL (ref 0.35–4.50)

## 2017-12-24 MED ORDER — CETIRIZINE HCL 10 MG PO TABS: 10.0000 mg | ORAL_TABLET | Freq: Every day | ORAL | 11 refills | Status: DC

## 2017-12-24 MED ORDER — FUROSEMIDE 40 MG PO TABS: 40.0000 mg | ORAL_TABLET | Freq: Every day | ORAL | 3 refills | Status: DC

## 2017-12-24 MED ORDER — FLUTICASONE PROPIONATE 50 MCG/ACT NA SUSP: 2.0000 | Freq: Every day | NASAL | 6 refills | Status: DC

## 2017-12-24 MED ORDER — MONTELUKAST SODIUM 10 MG PO TABS: 10.0000 mg | ORAL_TABLET | Freq: Every day | ORAL | 3 refills | Status: DC

## 2017-12-24 MED ORDER — ONDANSETRON HCL 4 MG PO TABS: 4.0000 mg | ORAL_TABLET | Freq: Three times a day (TID) | ORAL | 0 refills | Status: DC | PRN

## 2017-12-24 NOTE — Assessment & Plan Note (Signed)
Pt's PE is remarkable for obesity, jaundice, abdominal fluid wave, LE edema.  UTD on colon cancer screen.  UTD on immunizations- pt is considering Shingrix.  Check labs.  Anticipatory guidance provided.

## 2017-12-24 NOTE — Assessment & Plan Note (Signed)
Ongoing issue.  Pt presents today w/ jaundice, SOB, LE edema and N/V.  I am concerned for biliary or pancreatic issue.  Will get labs and treat swelling symptomatically w/ Lasix.  Initially, pt declined CT scan but after further conversation regarding my concerns, pt agrees to proceed w/ CT.  Will follow closely.

## 2017-12-24 NOTE — Progress Notes (Signed)
   Subjective:    Patient ID: Timothy Mckinney, male    DOB: 06-21-1951, 67 y.o.   MRN: [id number]  HPI CPE- UTD on colon cancer screen, urology   Review of Systems Patient reports no vision/hearing changes, anorexia, fever ,adenopathy, persistant/recurrent hoarseness, swallowing issues, chest pain, palpitations, persistant/recurrent cough, hemoptysis, gastrointestinal  bleeding (melena, rectal bleeding), abdominal pain, excessive heart burn, GU symptoms (dysuria, hematuria, voiding/incontinence issues) syncope, focal weakness, memory loss, numbness & tingling, skin/hair/nail changes, depression, anxiety, abnormal bruising/bleeding, musculoskeletal symptoms/signs.   + swelling of legs, L>R x4-6 weeks.  Legs have remained swollen for the last few weeks. + SOB- intermittent.  Pt is having more difficult time when golfing + nausea/vomiting intermittently- pt reports 'I lose my color when it happens'    Objective:   Physical Exam General Appearance:    Alert, cooperative, no distress, appears stated age, obese,   Head:    Normocephalic, without obvious abnormality, atraumatic  Eyes:    PERRL, conjunctiva/corneas clear, EOM's intact, fundi    benign, both eyes       Ears:    Normal TM's and external ear canals, both ears  Nose:   Nares normal, septum midline, mucosa normal, no drainage   or sinus tenderness  Throat:   Lips, mucosa, and tongue normal; teeth and gums normal  Neck:   Supple, symmetrical, trachea midline, no adenopathy;       thyroid:  No enlargement/tenderness/nodules  Back:     Symmetric, no curvature, ROM normal, no CVA tenderness  Lungs:     Clear to auscultation bilaterally, respirations unlabored  Chest wall:    No tenderness or deformity  Heart:    Regular rate and rhythm, S1 and S2 normal, no murmur, rub   or gallop  Abdomen:     Soft, non-tender, bowel sounds active all four quadrants,    no masses, + splenomegaly and hepatomegaly, + fluid wave  Genitalia:    Deferred  to urology  Rectal:    Extremities:   Extremities normal, atraumatic, no cyanosis or edema  Pulses:   2+ and symmetric all extremities  Skin:   Skin color, texture, turgor normal, no rashes or lesions, jaundiced  Lymph nodes:   Cervical, supraclavicular, and axillary nodes normal  Neurologic:   CNII-XII intact. Normal strength, sensation and reflexes      throughout          Assessment & Plan:

## 2017-12-24 NOTE — Patient Instructions (Addendum)
Bring a copy of your living will and/or healthcare power of attorney to your next office visit.  Continue doing brain stimulating activities (puzzles, reading, adult coloring books, staying active) to keep memory sharp.    Health Maintenance, Male A healthy lifestyle and preventive care is important for your health and wellness. Ask your health care provider about what schedule of regular examinations is right for you. What should I know about weight and diet? Eat a Healthy Diet  Eat plenty of vegetables, fruits, whole grains, low-fat dairy products, and lean protein.  Do not eat a lot of foods high in solid fats, added sugars, or salt.  Maintain a Healthy Weight Regular exercise can help you achieve or maintain a healthy weight. You should:  Do at least 150 minutes of exercise each week. The exercise should increase your heart rate and make you sweat (moderate-intensity exercise).  Do strength-training exercises at least twice a week.  Watch Your Levels of Cholesterol and Blood Lipids  Have your blood tested for lipids and cholesterol every 5 years starting at 69 years of age. If you are at high risk for heart disease, you should start having your blood tested when you are 69 years old. You may need to have your cholesterol levels checked more often if: ? Your lipid or cholesterol levels are high. ? You are older than 69 years of age. ? You are at high risk for heart disease.  What should I know about cancer screening? Many types of cancers can be detected early and may often be prevented. Lung Cancer  You should be screened every year for lung cancer if: ? You are a current smoker who has smoked for at least 30 years. ? You are a former smoker who has quit within the past 15 years.  Talk to your health care provider about your screening options, when you should start screening, and how often you should be screened.  Colorectal Cancer  Routine colorectal cancer screening  usually begins at 69 years of age and should be repeated every 5-10 years until you are 69 years old. You may need to be screened more often if early forms of precancerous polyps or small growths are found. Your health care provider may recommend screening at an earlier age if you have risk factors for colon cancer.  Your health care provider may recommend using home test kits to check for hidden blood in the stool.  A small camera at the end of a tube can be used to examine your colon (sigmoidoscopy or colonoscopy). This checks for the earliest forms of colorectal cancer.  Prostate and Testicular Cancer  Depending on your age and overall health, your health care provider may do certain tests to screen for prostate and testicular cancer.  Talk to your health care provider about any symptoms or concerns you have about testicular or prostate cancer.  Skin Cancer  Check your skin from head to toe regularly.  Tell your health care provider about any new moles or changes in moles, especially if: ? There is a change in a mole's size, shape, or color. ? You have a mole that is larger than a pencil eraser.  Always use sunscreen. Apply sunscreen liberally and repeat throughout the day.  Protect yourself by wearing long sleeves, pants, a wide-brimmed hat, and sunglasses when outside.  What should I know about heart disease, diabetes, and high blood pressure?  If you are 18-39 years of age, have your blood pressure checked every   3-5 years. If you are 40 years of age or older, have your blood pressure checked every year. You should have your blood pressure measured twice-once when you are at a hospital or clinic, and once when you are not at a hospital or clinic. Record the average of the two measurements. To check your blood pressure when you are not at a hospital or clinic, you can use: ? An automated blood pressure machine at a pharmacy. ? A home blood pressure monitor.  Talk to your health care  provider about your target blood pressure.  If you are between 45-79 years old, ask your health care provider if you should take aspirin to prevent heart disease.  Have regular diabetes screenings by checking your fasting blood sugar level. ? If you are at a normal weight and have a low risk for diabetes, have this test once every three years after the age of 45. ? If you are overweight and have a high risk for diabetes, consider being tested at a younger age or more often.  A one-time screening for abdominal aortic aneurysm (AAA) by ultrasound is recommended for men aged 65-75 years who are current or former smokers. What should I know about preventing infection? Hepatitis B If you have a higher risk for hepatitis B, you should be screened for this virus. Talk with your health care provider to find out if you are at risk for hepatitis B infection. Hepatitis C Blood testing is recommended for:  Everyone born from 1945 through 1965.  Anyone with known risk factors for hepatitis C.  Sexually Transmitted Diseases (STDs)  You should be screened each year for STDs including gonorrhea and chlamydia if: ? You are sexually active and are younger than 69 years of age. ? You are older than 69 years of age and your health care provider tells you that you are at risk for this type of infection. ? Your sexual activity has changed since you were last screened and you are at an increased risk for chlamydia or gonorrhea. Ask your health care provider if you are at risk.  Talk with your health care provider about whether you are at high risk of being infected with HIV. Your health care provider may recommend a prescription medicine to help prevent HIV infection.  What else can I do?  Schedule regular health, dental, and eye exams.  Stay current with your vaccines (immunizations).  Do not use any tobacco products, such as cigarettes, chewing tobacco, and e-cigarettes. If you need help quitting, ask  your health care provider.  Limit alcohol intake to no more than 2 drinks per day. One drink equals 12 ounces of beer, 5 ounces of wine, or 1 ounces of hard liquor.  Do not use street drugs.  Do not share needles.  Ask your health care provider for help if you need support or information about quitting drugs.  Tell your health care provider if you often feel depressed.  Tell your health care provider if you have ever been abused or do not feel safe at home. This information is not intended to replace advice given to you by your health care provider. Make sure you discuss any questions you have with your health care provider. Document Released: 12/14/2007 Document Revised: 02/14/2016 Document Reviewed: 03/21/2015 Elsevier Interactive Patient Education  2018 Elsevier Inc.  

## 2017-12-24 NOTE — Assessment & Plan Note (Signed)
Chronic problem.  Adequate control.  Check labs.  Adjust meds prn.

## 2017-12-24 NOTE — Patient Instructions (Addendum)
Follow up in 2-3 weeks to recheck swelling We'll notify you of your lab results and make any changes if needed Restart the Cetirizine (Zyrtec), Flonase, and ADD the Singulair nightly to decrease post-nasal drip Use the Ondansetron as needed for vomiting START the Lasix once daily to decrease swelling Please reconsider the CT scan given your swelling, shortness of breath, abnormal coloring, nausea/vomiting- I want to rule out the big scary things Call with any questions or concerns Hang in there!!!

## 2017-12-25 ENCOUNTER — Telehealth: Payer: Self-pay | Admitting: General Practice

## 2017-12-25 LAB — HEPATITIS PANEL, ACUTE
HEP B C IGM: NONREACTIVE
Hep A IgM: NONREACTIVE
Hepatitis B Surface Ag: NONREACTIVE
Hepatitis C Ab: NONREACTIVE
SIGNAL TO CUT-OFF: 0.06 (ref <1.00)

## 2017-12-25 MED ORDER — MONTELUKAST SODIUM 10 MG PO TABS: 10.0000 mg | ORAL_TABLET | Freq: Every day | ORAL | 3 refills | Status: DC

## 2017-12-25 MED ORDER — ONDANSETRON HCL 4 MG PO TABS: 4.0000 mg | ORAL_TABLET | Freq: Three times a day (TID) | ORAL | 0 refills | Status: DC | PRN

## 2017-12-25 MED ORDER — FUROSEMIDE 40 MG PO TABS: 40.0000 mg | ORAL_TABLET | Freq: Every day | ORAL | 3 refills | Status: DC

## 2017-12-25 MED ORDER — FLUTICASONE PROPIONATE 50 MCG/ACT NA SUSP: 2.0000 | Freq: Every day | NASAL | 6 refills | Status: DC

## 2017-12-25 NOTE — Telephone Encounter (Signed)
Medication filled to pharmacy as requested. Pt made aware.    Copied from [place] [id number]. Topic: General - Other >> Dec 25, 2017  8:41 AM Oneta Rack wrote: Relation to pt: self  Call back number:[phone]  Reason for call:  [id number] [place], [place] [phone] (Phone) [phone] (Fax) currently without electricity, patient requesting all prescriptions sent in yesterday 12/24/17, please send to  [facility] [address] [place], [place], [place] [address] [phone].   Patient states he was advised to take montelukast (SINGULAIR) 10 MG tablet  at night and unsure when he should take cetirizine (ZYRTEC) 10 MG tablet and  fluticasone (FLONASE) 50 MCG/ACT nasal spray, please advise

## 2017-12-30 ENCOUNTER — Telehealth: Payer: Self-pay | Admitting: Family Medicine

## 2017-12-30 NOTE — Telephone Encounter (Signed)
Patient called back. He has not scheduled the CT because he thinks he will throw up the oral contrast. Advised patient we would call him back to advise.

## 2017-12-30 NOTE — Telephone Encounter (Signed)
Please advise 

## 2017-12-30 NOTE — Telephone Encounter (Signed)
Called and spoke with pt. He says that the thought of drinking something last week and having imaging done would not be possible. He says that he has zofran at home and feels more on the upswing of things. He is more concerned that his leg is still swelling during the day and wants to know if he needs to increase his fluid pill.   Pt aware pcp is out of office until tomorrow.

## 2017-12-30 NOTE — Telephone Encounter (Signed)
Patient has order in for CT Abdomin. Patient told [facility] that he would call back later to schedule. LM for patient to remind him that CT can be scheduled at [phone] or to call the office directly & we will assist him with scheduling.

## 2017-12-30 NOTE — Telephone Encounter (Signed)
We can give him Zofran ODT to dissolve under his tongue to help w/ any nausea.  But if he truly is unable to hold anything down, he needs to go to the ER

## 2017-12-31 ENCOUNTER — Ambulatory Visit: Payer: PPO | Admitting: Family Medicine

## 2017-12-31 NOTE — Telephone Encounter (Signed)
Pt cannot increase Lasix w/o first checking BMP to assess kidney function and potassium.  Would need lab visit (at a minimum) but if swelling has not improved or worsened, would prefer that he see me for follow up.

## 2017-12-31 NOTE — Telephone Encounter (Signed)
Attempted to call pt.   LM for patient to call to discuss.  CRM created so that if patient is still having leg swelling he can make an appt

## 2018-01-02 ENCOUNTER — Encounter: Payer: Self-pay | Admitting: Family Medicine

## 2018-01-02 ENCOUNTER — Ambulatory Visit (INDEPENDENT_AMBULATORY_CARE_PROVIDER_SITE_OTHER): Payer: PPO | Admitting: Family Medicine

## 2018-01-02 ENCOUNTER — Other Ambulatory Visit: Payer: Self-pay

## 2018-01-02 VITALS — BP 122/66 | HR 67 | Temp 98.6°F | Resp 16 | Ht 71.0 in | Wt 277.2 lb

## 2018-01-02 DIAGNOSIS — R600 Localized edema: Secondary | ICD-10-CM

## 2018-01-02 NOTE — Progress Notes (Signed)
   Subjective:    Patient ID: Timothy Mckinney, male    DOB: July 09, 1950, 67 y.o.   MRN: [id number]  HPI Leg swelling- pt was started on 40mg  Lasix last visit.  Pt reports legs will 'go down at night' but once he is up in the AM and moving about they again swell.  Legs feel tight and are uncomfortable, L>R.  Pt is down 5 lbs since 6/26.  Denies CP, SOB.  Does notice increased urinary outpt after taking medication.   Review of Systems For ROS see HPI     Objective:   Physical Exam  Constitutional: He is oriented to person, place, and time. He appears well-developed and well-nourished. No distress.  obese  HENT:  Head: Normocephalic and atraumatic.  Eyes: Pupils are equal, round, and reactive to light. Conjunctivae and EOM are normal.  Neck: Normal range of motion. Neck supple. No thyromegaly present.  Cardiovascular: Normal rate, regular rhythm, normal heart sounds and intact distal pulses.  No murmur heard. Pulmonary/Chest: Effort normal and breath sounds normal. No respiratory distress. He has no wheezes. He has no rales.  Abdominal: Soft. Bowel sounds are normal. He exhibits no distension. There is no tenderness. There is no guarding.  Musculoskeletal: He exhibits edema (2+ pitting edema of L leg to just below knee, 2+ edema of R leg to mid shin).  Lymphadenopathy:    He has no cervical adenopathy.  Neurological: He is alert and oriented to person, place, and time. No cranial nerve deficit.  Skin: Skin is warm and dry.  Less jaundiced than last visit  Psychiatric: He has a normal mood and affect. His behavior is normal.  Vitals reviewed.         Assessment & Plan:  Edema- pt is down 5 lbs of fluid since last visit.  No longer has an abdominal fluid wave.  2+ pitting edema of LEs bilaterally.  No crackles or rales on exam.  Increase Lasix to 40mg  qAM and 20mg  in the afternoon.  Check BMP.  Continue to follow closely as we may need to change his diuretic if higher doses aren't  effective.  Pt expressed understanding and is in agreement w/ plan.

## 2018-01-02 NOTE — Patient Instructions (Signed)
Follow up as scheduled We'll notify you of your lab results and make any changes if needed INCREASE your Furosemide to 1 pill in the AM and 1/2 tab early afternoon Continue to drink plenty of water Limit your salt intake Call with any questions or concerns Have a great weekend!

## 2018-01-03 LAB — BASIC METABOLIC PANEL
BUN: 19 mg/dL (ref 7–25)
CO2: 32 mmol/L (ref 20–32)
CREATININE: 1.04 mg/dL (ref 0.70–1.25)
Calcium: 8.9 mg/dL (ref 8.6–10.3)
Chloride: 97 mmol/L — ABNORMAL LOW (ref 98–110)
Glucose, Bld: 115 mg/dL — ABNORMAL HIGH (ref 65–99)
POTASSIUM: 4.1 mmol/L (ref 3.5–5.3)
SODIUM: 137 mmol/L (ref 135–146)

## 2018-01-09 ENCOUNTER — Encounter: Payer: Self-pay | Admitting: Family Medicine

## 2018-01-09 ENCOUNTER — Other Ambulatory Visit: Payer: Self-pay

## 2018-01-09 ENCOUNTER — Ambulatory Visit (INDEPENDENT_AMBULATORY_CARE_PROVIDER_SITE_OTHER): Payer: PPO | Admitting: Family Medicine

## 2018-01-09 VITALS — BP 126/78 | HR 81 | Temp 98.1°F | Resp 19 | Ht 71.0 in | Wt 276.4 lb

## 2018-01-09 DIAGNOSIS — R600 Localized edema: Secondary | ICD-10-CM | POA: Diagnosis not present

## 2018-01-09 LAB — BASIC METABOLIC PANEL
BUN: 17 mg/dL (ref 6–23)
CO2: 32 mEq/L (ref 19–32)
Calcium: 8.7 mg/dL (ref 8.4–10.5)
Chloride: 96 mEq/L (ref 96–112)
Creatinine, Ser: 1.07 mg/dL (ref 0.40–1.50)
GFR: 72.86 mL/min (ref >60.00)
GLUCOSE: 97 mg/dL (ref 70–99)
POTASSIUM: 4.2 meq/L (ref 3.5–5.1)
SODIUM: 135 meq/L (ref 135–145)

## 2018-01-09 MED ORDER — FUROSEMIDE 40 MG PO TABS: 40.0000 mg | ORAL_TABLET | Freq: Two times a day (BID) | ORAL | 3 refills | Status: DC

## 2018-01-09 NOTE — Patient Instructions (Signed)
Follow up in 3-4 weeks to recheck swelling We'll notify you of your lab results and make any changes if needed Please schedule your CT scan at your convenience INCREASE the Furosemide to 40/40mg - 1 in AM, 1 in PM Call with any questions or concerns Have a great weekend!

## 2018-01-09 NOTE — Progress Notes (Signed)
   Subjective:    Patient ID: Timothy Mckinney, male    DOB: 09-Sep-1950, 67 y.o.   MRN: [id number]  HPI Edema- lasix was increased at last visit to 40mg  qAM and 20mg  qPM.  Pt reports legs are less swollen, less painful.  Continues to have swelling of ankles bilaterally.  Pt has not scheduled his abd CT- does not plan to schedule any time soon.  Review of Systems For ROS see HPI     Objective:   Physical Exam  Constitutional: He is oriented to person, place, and time. He appears well-developed and well-nourished. No distress.  obese  Musculoskeletal: He exhibits edema (1+ pitting edema of bilateral ankles and feet, swelling has improved in lower legs).  Neurological: He is alert and oriented to person, place, and time.  Skin: Skin is warm and dry. No erythema.  Coloring is better today  Vitals reviewed.         Assessment & Plan:  Edema- improving since increasing lasix to 40/20mg  daily.  Continues to have ankle and pedal edema bilaterally.  Will increase to 40/40mg .  Again stressed need for him to schedule CT scan to r/o pancreatic or liver abnormality that could be contributing to edema.  Pt is not interested or willing at this time.  Will continue to follow.  Check BMP today w/ titration of Lasix.

## 2018-01-19 ENCOUNTER — Telehealth: Payer: Self-pay | Admitting: Hematology and Oncology

## 2018-01-19 ENCOUNTER — Telehealth: Payer: Self-pay | Admitting: *Deleted

## 2018-01-19 NOTE — Telephone Encounter (Signed)
Called pt re appts added per 7/22 sch msg - spoke w/ pt and confirmed appt.

## 2018-01-19 NOTE — Telephone Encounter (Signed)
Pt called and states he needs to delay his appt X 1 month. Has had fluid on legs which is resolving.

## 2018-01-19 NOTE — Telephone Encounter (Signed)
pls send scheduling msg for 1 month

## 2018-01-20 ENCOUNTER — Inpatient Hospital Stay: Payer: PPO

## 2018-01-20 ENCOUNTER — Inpatient Hospital Stay: Payer: PPO | Admitting: Hematology and Oncology

## 2018-02-11 ENCOUNTER — Ambulatory Visit (INDEPENDENT_AMBULATORY_CARE_PROVIDER_SITE_OTHER): Payer: PPO | Admitting: Family Medicine

## 2018-02-11 ENCOUNTER — Encounter: Payer: Self-pay | Admitting: Family Medicine

## 2018-02-11 ENCOUNTER — Other Ambulatory Visit: Payer: Self-pay

## 2018-02-11 VITALS — BP 126/76 | HR 62 | Temp 98.1°F | Resp 16 | Ht 71.0 in | Wt 261.2 lb

## 2018-02-11 DIAGNOSIS — R600 Localized edema: Secondary | ICD-10-CM | POA: Diagnosis not present

## 2018-02-11 DIAGNOSIS — G8929 Other chronic pain: Secondary | ICD-10-CM | POA: Diagnosis not present

## 2018-02-11 DIAGNOSIS — R17 Unspecified jaundice: Secondary | ICD-10-CM | POA: Diagnosis not present

## 2018-02-11 DIAGNOSIS — M5440 Lumbago with sciatica, unspecified side: Secondary | ICD-10-CM

## 2018-02-11 DIAGNOSIS — M545 Low back pain: Secondary | ICD-10-CM

## 2018-02-11 LAB — BASIC METABOLIC PANEL
BUN: 25 mg/dL — AB (ref 6–23)
CALCIUM: 10 mg/dL (ref 8.4–10.5)
CO2: 33 mEq/L — ABNORMAL HIGH (ref 19–32)
Chloride: 99 mEq/L (ref 96–112)
Creatinine, Ser: 1.31 mg/dL (ref 0.40–1.50)
GFR: 57.67 mL/min — AB (ref >60.00)
Glucose, Bld: 89 mg/dL (ref 70–99)
POTASSIUM: 4.8 meq/L (ref 3.5–5.1)
SODIUM: 139 meq/L (ref 135–145)

## 2018-02-11 LAB — CBC WITH DIFFERENTIAL/PLATELET
BASOS ABS: 0 10*3/uL (ref 0.0–0.1)
Basophils Relative: 1.2 % (ref 0.0–3.0)
EOS PCT: 7 % — AB (ref 0.0–5.0)
Eosinophils Absolute: 0.3 10*3/uL (ref 0.0–0.7)
HEMATOCRIT: 31.3 % — AB (ref 39.0–52.0)
HEMOGLOBIN: 10.9 g/dL — AB (ref 13.0–17.0)
LYMPHS PCT: 31 % (ref 12.0–46.0)
Lymphs Abs: 1.2 10*3/uL (ref 0.7–4.0)
MCHC: 35 g/dL (ref 30.0–36.0)
MCV: 104.3 fl — AB (ref 78.0–100.0)
MONOS PCT: 13.6 % — AB (ref 3.0–12.0)
Monocytes Absolute: 0.5 10*3/uL (ref 0.1–1.0)
Neutro Abs: 1.8 10*3/uL (ref 1.4–7.7)
Neutrophils Relative %: 47.2 % (ref 43.0–77.0)
Platelets: 68 10*3/uL — ABNORMAL LOW (ref 150.0–400.0)
RBC: 3 Mil/uL — ABNORMAL LOW (ref 4.22–5.81)
RDW: 15.2 % (ref 11.5–15.5)
WBC: 3.7 10*3/uL — ABNORMAL LOW (ref 4.0–10.5)

## 2018-02-11 LAB — HEPATIC FUNCTION PANEL
ALT: 19 U/L (ref 0–53)
AST: 49 U/L — AB (ref 0–37)
Albumin: 3.6 g/dL (ref 3.5–5.2)
Alkaline Phosphatase: 86 U/L (ref 39–117)
BILIRUBIN TOTAL: 5.1 mg/dL — AB (ref 0.2–1.2)
Bilirubin, Direct: 1.7 mg/dL — ABNORMAL HIGH (ref 0.0–0.3)
Total Protein: 6.7 g/dL (ref 6.0–8.3)

## 2018-02-11 LAB — BRAIN NATRIURETIC PEPTIDE: Pro B Natriuretic peptide (BNP): 383 pg/mL — ABNORMAL HIGH (ref 0.0–100.0)

## 2018-02-11 LAB — TSH: TSH: 2.49 u[IU]/mL (ref 0.35–4.50)

## 2018-02-11 MED ORDER — TRAMADOL HCL 50 MG PO TABS: 50.0000 mg | ORAL_TABLET | Freq: Three times a day (TID) | ORAL | 0 refills | Status: DC | PRN

## 2018-02-11 NOTE — Progress Notes (Signed)
   Subjective:    Patient ID: ELVIS LAUFER, male    DOB: 31-Jul-1950, 67 y.o.   MRN: [id number]  HPI Edema- pt is down 15 lbs since last visit.  Able to again wear shoes.  No swelling of legs.  'I can even feel it in my stomach'.  Continues to take 40mg  Lasix twice daily.  No CP, SOB, HAs.  Jaundice- pt's last bilirubin was 5.  Continues to have nausea- 'it's not every day'.  Pt attributes this to PND.  Nausea improves w/ nasal spray.  No abd pain.  Has not yet had [place] or CT scan.  Pt wants to wait until he sees Dr Alvy Bimler later this month.  'I just don't want to deal with it'.  Chronic low back pain- pt has been to PT, ortho (s/p injxns), accupuncture, using CBD oil.  Pt was previously on pain medication.  Wants to start walking and playing golf but needs pain relief to build stamina.   Review of Systems For ROS see HPI     Objective:   Physical Exam  Constitutional: He is oriented to person, place, and time. He appears well-developed and well-nourished. No distress.  HENT:  Head: Normocephalic and atraumatic.  Eyes: Pupils are equal, round, and reactive to light. Conjunctivae and EOM are normal.  Neck: Normal range of motion. Neck supple. No thyromegaly present.  Cardiovascular: Normal rate, regular rhythm, normal heart sounds and intact distal pulses.  No murmur heard. Pulmonary/Chest: Effort normal and breath sounds normal. No respiratory distress.  Abdominal: Soft. Bowel sounds are normal. He exhibits no distension and no mass. There is no tenderness. There is no guarding.  obese  Musculoskeletal: He exhibits no edema.  Lymphadenopathy:    He has no cervical adenopathy.  Neurological: He is alert and oriented to person, place, and time. No cranial nerve deficit.  Skin: Skin is warm and dry.  Yellow tint to face  Psychiatric: He has a normal mood and affect. His behavior is normal.  Vitals reviewed.         Assessment & Plan:  Edema- pt is down 15 lbs since last visit w/  increased Lasix dose.  Abd is less taut, legs are back to normal.  Will check labs and decrease Lasix dose to 20mg  in the afternoon and continue 40mg  in the AM.  Again discussed that I'm concerned his liver is causing the swelling and needs to be assessed w/ imaging.  Pt again declines.  Told him that there is the possibility of a tumor/malignancy and he again declined.  Jaundice- pt's color has improved since previous visits but still has a yellow tone.  Last bilirubin was elevated.  Will repeat today.  Again discussed need for biliary imaging.  Pt declined despite me voicing my concerns.

## 2018-02-11 NOTE — Assessment & Plan Note (Signed)
Ongoing issue for pt.  Has been to PT, accupuncture, Dr Nelva Bush w/o relief.  He would like to resume walking as he knows this will improve his pain and mobility but in his current state he is not able.  Asking for pain medication to take as needed.  Will start Tramadol and monitor for improvement.  Pt expressed understanding and is in agreement w/ plan.

## 2018-02-11 NOTE — Patient Instructions (Addendum)
Follow up in December to recheck BP and cholesterol We'll notify you of your lab results and make any changes if needed Decrease the Furosemide dose to 20 mg (1/2 tab) in the afternoon Continue the 40mg  Furosemide in the morning Take the Tramadol as needed for back pain Please consider the CT scan or ultrasound of your abdomen so we can determine the cause of your swelling/jaundice Call with any questions or concerns Happy Early Birthday!!!

## 2018-02-23 ENCOUNTER — Inpatient Hospital Stay (HOSPITAL_BASED_OUTPATIENT_CLINIC_OR_DEPARTMENT_OTHER): Payer: PPO | Admitting: Hematology and Oncology

## 2018-02-23 ENCOUNTER — Telehealth: Payer: Self-pay | Admitting: Hematology and Oncology

## 2018-02-23 ENCOUNTER — Inpatient Hospital Stay: Payer: PPO | Attending: Hematology and Oncology

## 2018-02-23 ENCOUNTER — Encounter: Payer: Self-pay | Admitting: Hematology and Oncology

## 2018-02-23 DIAGNOSIS — F1010 Alcohol abuse, uncomplicated: Secondary | ICD-10-CM

## 2018-02-23 DIAGNOSIS — R600 Localized edema: Secondary | ICD-10-CM

## 2018-02-23 DIAGNOSIS — M549 Dorsalgia, unspecified: Secondary | ICD-10-CM | POA: Diagnosis not present

## 2018-02-23 DIAGNOSIS — N189 Chronic kidney disease, unspecified: Secondary | ICD-10-CM | POA: Insufficient documentation

## 2018-02-23 DIAGNOSIS — D61818 Other pancytopenia: Secondary | ICD-10-CM

## 2018-02-23 DIAGNOSIS — E559 Vitamin D deficiency, unspecified: Secondary | ICD-10-CM | POA: Insufficient documentation

## 2018-02-23 DIAGNOSIS — M5136 Other intervertebral disc degeneration, lumbar region: Secondary | ICD-10-CM | POA: Diagnosis not present

## 2018-02-23 DIAGNOSIS — G8929 Other chronic pain: Secondary | ICD-10-CM | POA: Diagnosis not present

## 2018-02-23 DIAGNOSIS — M1990 Unspecified osteoarthritis, unspecified site: Secondary | ICD-10-CM | POA: Insufficient documentation

## 2018-02-23 DIAGNOSIS — N179 Acute kidney failure, unspecified: Secondary | ICD-10-CM | POA: Diagnosis not present

## 2018-02-23 DIAGNOSIS — E538 Deficiency of other specified B group vitamins: Secondary | ICD-10-CM

## 2018-02-23 DIAGNOSIS — K219 Gastro-esophageal reflux disease without esophagitis: Secondary | ICD-10-CM | POA: Insufficient documentation

## 2018-02-23 DIAGNOSIS — I129 Hypertensive chronic kidney disease with stage 1 through stage 4 chronic kidney disease, or unspecified chronic kidney disease: Secondary | ICD-10-CM | POA: Insufficient documentation

## 2018-02-23 DIAGNOSIS — Z79899 Other long term (current) drug therapy: Secondary | ICD-10-CM

## 2018-02-23 DIAGNOSIS — K760 Fatty (change of) liver, not elsewhere classified: Secondary | ICD-10-CM

## 2018-02-23 DIAGNOSIS — K709 Alcoholic liver disease, unspecified: Secondary | ICD-10-CM

## 2018-02-23 LAB — CBC WITH DIFFERENTIAL (CANCER CENTER ONLY)
BASOS ABS: 0 10*3/uL (ref 0.0–0.1)
BASOS PCT: 1 %
EOS ABS: 0.2 10*3/uL (ref 0.0–0.5)
Eosinophils Relative: 6 %
HCT: 30.3 % — ABNORMAL LOW (ref 38.4–49.9)
HEMOGLOBIN: 10.4 g/dL — AB (ref 13.0–17.1)
LYMPHS ABS: 0.8 10*3/uL — AB (ref 0.9–3.3)
Lymphocytes Relative: 26 %
MCH: 35.9 pg — ABNORMAL HIGH (ref 27.2–33.4)
MCHC: 34.5 g/dL (ref 32.0–36.0)
MCV: 104.2 fL — ABNORMAL HIGH (ref 79.3–98.0)
Monocytes Absolute: 0.3 10*3/uL (ref 0.1–0.9)
Monocytes Relative: 9 %
NEUTROS PCT: 58 %
Neutro Abs: 1.9 10*3/uL (ref 1.5–6.5)
PLATELETS: 72 10*3/uL — AB (ref 140–400)
RBC: 2.91 MIL/uL — AB (ref 4.20–5.82)
RDW: 15.3 % — ABNORMAL HIGH (ref 11.0–14.6)
WBC: 3.2 10*3/uL — AB (ref 4.0–10.3)

## 2018-02-23 LAB — CMP (CANCER CENTER ONLY)
ALT: 19 U/L (ref 0–44)
AST: 54 U/L — ABNORMAL HIGH (ref 15–41)
Albumin: 3.1 g/dL — ABNORMAL LOW (ref 3.5–5.0)
Alkaline Phosphatase: 134 U/L — ABNORMAL HIGH (ref 38–126)
Anion gap: 7 (ref 5–15)
BILIRUBIN TOTAL: 3.2 mg/dL — AB (ref 0.3–1.2)
BUN: 24 mg/dL — ABNORMAL HIGH (ref 8–23)
CALCIUM: 9.3 mg/dL (ref 8.9–10.3)
CHLORIDE: 97 mmol/L — AB (ref 98–111)
CO2: 31 mmol/L (ref 22–32)
CREATININE: 1.73 mg/dL — AB (ref 0.61–1.24)
GFR, EST AFRICAN AMERICAN: 45 mL/min — AB (ref >60)
GFR, EST NON AFRICAN AMERICAN: 39 mL/min — AB (ref >60)
Glucose, Bld: 129 mg/dL — ABNORMAL HIGH (ref 70–99)
Potassium: 4.8 mmol/L (ref 3.5–5.1)
Sodium: 135 mmol/L (ref 135–145)
TOTAL PROTEIN: 6.6 g/dL (ref 6.5–8.1)

## 2018-02-23 LAB — LACTATE DEHYDROGENASE: LDH: 180 U/L (ref 98–192)

## 2018-02-23 LAB — FOLATE: Folate: 4.1 ng/mL — ABNORMAL LOW (ref >5.9)

## 2018-02-23 LAB — VITAMIN B12: Vitamin B-12: 563 pg/mL (ref 180–914)

## 2018-02-23 NOTE — Telephone Encounter (Signed)
Gave avs and calendar ° °

## 2018-02-24 LAB — VITAMIN D 25 HYDROXY (VIT D DEFICIENCY, FRACTURES): Vit D, 25-Hydroxy: 18.3 ng/mL — ABNORMAL LOW (ref 30.0–100.0)

## 2018-02-25 ENCOUNTER — Telehealth: Payer: Self-pay

## 2018-02-25 ENCOUNTER — Encounter: Payer: Self-pay | Admitting: Hematology and Oncology

## 2018-02-25 ENCOUNTER — Telehealth: Payer: Self-pay | Admitting: Family Medicine

## 2018-02-25 DIAGNOSIS — N179 Acute kidney failure, unspecified: Secondary | ICD-10-CM | POA: Insufficient documentation

## 2018-02-25 DIAGNOSIS — E538 Deficiency of other specified B group vitamins: Secondary | ICD-10-CM | POA: Insufficient documentation

## 2018-02-25 NOTE — Telephone Encounter (Signed)
Called and advised pt of PCP recommendations. Pt was argumentative did not feel that having labs redrawn on Friday would be sufficient time. Also wanted to have meds reduced. Spoke with PCP while on the phone with Pt. She advised to have him decrease his dosage to 1/2 pil in the am and 1/2 pill in the pm, increase water intake and follow up with PCP on Tuesday.   Pt again argued that he did not know what this would show in the labs since we had slowly increased his medication. Pt was advised that per both his specialist (Dr. Elson Areas -hematology) and PCP that these levels could be indicative of sever kidney issues. We need to ensure that these levels are decreasing as if they increase then we would need to get him to a specialist. Pt said he was not arguing just did not think this would show anything.. Finally I advised that per Dr. Birdie Riddle he needed to be seen and I was going to schedule him an appointment before I hung up the phone. Pt finally relented and scheduled for Tuesday.

## 2018-02-25 NOTE — Telephone Encounter (Signed)
Called per Dr. Alvy Bimler. Instructed to buy OTC & start Folate 1 mg daily and a Vitamin D 5,000 units daily. Given Creatinine results and told Dr. Virgil Benedict office notified of results and to expect a call. He verbalized understanding.

## 2018-02-25 NOTE — Telephone Encounter (Signed)
Copied from [place] [id number]. Topic: Quick Communication - See Telephone Encounter >> Feb 25, 2018 10:55 AM Oliver Pila B wrote: CRM for notification. See Telephone encounter for: 02/25/18.  Dr. Alvy Bimler office called to inform pcp that the pt's creatin was elevated (1.7) and the edema was less in his legs; contact pt or Dr. Alvy Bimler office if needed

## 2018-02-25 NOTE — Assessment & Plan Note (Signed)
He had mild acute on chronic renal failure secondary to high-dose diuretic therapy that was prescribed for leg edema We will contact his primary care doctor to see if adjustment of furosemide further would make sense

## 2018-02-25 NOTE — Assessment & Plan Note (Signed)
He is not symptomatic from pancytopenia I recommend him to watch for signs and symptoms of bleeding due to thrombocytopenia

## 2018-02-25 NOTE — Assessment & Plan Note (Signed)
The pattern of pancytopenia and prior bone marrow biopsy have excluded myelodysplastic syndrome as the cause of pancytopenia The patient admits that he has recurrent heavy alcohol use over the past few weeks due to concern for his wife's well-being I recommend alcohol cessation if possible I plan to see him back again in 3 months for further follow-up

## 2018-02-25 NOTE — Telephone Encounter (Signed)
Pt needs f/u w/ PCP to assess swelling, review diuretic dosing, and repeat Cr.  Should also increase water intake

## 2018-02-25 NOTE — Progress Notes (Signed)
[facility] progress notes  Patient Care Team: [name], MD as PCP - General (Family Medicine) Christy Sartorius, MD as Referring Physician (Urology) Noralee Space, MD as Consulting Physician (Pulmonary Disease) Juanita Craver, MD as Consulting Physician (Gastroenterology) Tamela Oddi, DDS as Consulting Physician (Oral Surgery)  CHIEF COMPLAINTS/PURPOSE OF VISIT:  Pancytopenia  HISTORY OF PRESENTING ILLNESS:  Timothy Mckinney 67 y.o. male was transferred to my care after his prior physician has left.  I reviewed the patient's records extensive and collaborated the history with the patient. Summary of his history is as follows: The patient was seen by another physician for evaluation of pancytopenia.   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 The patient was able to reduce alcohol intake until recently when his wife was not doing well and he started to drink alcohol on a regular basis again. He was prescribed furosemide recently due to bilateral leg swelling which has improved.  He found significant difficulties exercising due to chronic back pain. The patient denies any recent signs or symptoms of bleeding such as spontaneous epistaxis, hematuria or hematochezia. He had easy bruising.  He denies recent fever or chills  MEDICAL HISTORY:  Past Medical History:  Diagnosis Date  . Abnormal chest x-ray    ? granulomata  . Arthritis    knees and back   . DDD (degenerative disc disease), lumbar    MRI 08/2005  . Dry eyes, bilateral    Dr Bing Plume   . Elevated liver enzymes    HISTORY OF ELEVATED LIVER ENZYMES  . GERD (gastroesophageal reflux disease)    hx of   . Hypertension    NEW DIAGNOSIS AND STARTED ON METOPROLOL ON 10/04/11  . Hyperuricemia   . 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 Nephrolithiasis    Dr Serita Butcher  . Sleep apnea    STOP BANG SCORE OF 4  . Thrombocytopenia ([place])    HX OF LOW PLATELET COUNT    SURGICAL HISTORY: Past Surgical History:  Procedure Laterality Date  . ANAL FISTULECTOMY  06/01/2012   Procedure: FISTULECTOMY ANAL;  Surgeon: Leighton Ruff, MD;  Location: [facility];  Service: General;  Laterality: N/A;  Mucosal Advancement Flap   . CARDIAC CATHETERIZATION     in 40s, negative  . colon ulcer  2011  . COLONOSCOPY W/ POLYPECTOMY  2011   Dr Sharlett Iles  . KNEE ARTHROSCOPY  2009    L  . KNEE SURGERY     april 2013 total knee replacement removed  . LITHOTRIPSY    . tear duct plugs    . TOTAL KNEE ARTHROPLASTY  2011   Workman's Comp case, Dr Veverly Fells    SOCIAL HISTORY: Social History   Socioeconomic History  . Marital status: Married    Spouse name: [name]  . Number of children: Not on file  . Years of education: Not on file  . Highest education level: Not on file  Occupational History  . Occupation: retired Teaching laboratory technician  . Financial resource strain: Not on file  . Food insecurity:    Worry: Not on file  Inability: Not on file  . Transportation needs:    Medical: Not on file    Non-medical: Not on file  Tobacco Use  . Smoking status: Never Smoker  . Smokeless tobacco: Never Used  Substance and Sexual Activity  . Alcohol use: Yes    Comment: one or a few times a week, beer/vodka  . Drug use: No  . Sexual activity: Not on file  Lifestyle  . Physical activity:    Days per week: Not on file    Minutes per session: Not on file  . Stress: Not on file  Relationships  . Social connections:    Talks on phone: Not on file    Gets together: Not on file    Attends  religious service: Not on file    Active member of club or organization: Not on file    Attends meetings of clubs or organizations: Not on file    Relationship status: Not on file  . Intimate partner violence:    Fear of current or ex partner: Not on file    Emotionally abused: Not on file    Physically abused: Not on file    Forced sexual activity: Not on file  Other Topics Concern  . Not on file  Social History Narrative  . Not on file    FAMILY HISTORY: Family History  Problem Relation Age of Onset  . Heart attack Father 33  . Colon polyps Mother   . Dementia Mother        died post SDH  . Cancer Paternal Grandfather        throat , pipe smoker  . Colon polyps Sister   . Cancer Sister        Colon, stage 2  . Hearing loss Brother   . Stroke Neg Hx     ALLERGIES:  is allergic to penicillins.  MEDICATIONS:  Current Outpatient Medications  Medication Sig Dispense Refill  . cetirizine (ZYRTEC) 10 MG tablet Take 1 tablet (10 mg total) by mouth daily. 30 tablet 11  . clindamycin (CLEOCIN) 150 MG capsule TK 4 CAPSULES PO 1 H PRIOR TO APPT  0  . cycloSPORINE (RESTASIS) 0.05 % ophthalmic emulsion Place 1 drop into both eyes daily.    . diphenhydrAMINE (BENADRYL) 25 MG tablet Take 25 mg by mouth. ONE AT BEDTIME IF NEEDED SLEEP    . fluticasone (FLONASE) 50 MCG/ACT nasal spray Place 2 sprays into both nostrils daily. 16 g 6  . furosemide (LASIX) 40 MG tablet Take 1 tablet (40 mg total) by mouth 2 (two) times daily. 60 tablet 3  . Loperamide HCl (IMODIUM PO) Take by mouth daily.    . meloxicam (MOBIC) 15 MG tablet TAKE 1 TABLET BY MOUTH ONCE DAILY 90 tablet 0  . metoprolol succinate (TOPROL-XL) 50 MG 24 hr tablet TAKE 1 TABLET BY MOUTH ONCE DAILY 90 tablet 0  . montelukast (SINGULAIR) 10 MG tablet Take 1 tablet (10 mg total) by mouth at bedtime. 30 tablet 3  . NONFORMULARY OR COMPOUNDED ITEM Take 2 drops by mouth 2 (two) times daily.    . ondansetron (ZOFRAN) 4 MG tablet Take 1  tablet (4 mg total) by mouth every 8 (eight) hours as needed for nausea or vomiting. 20 tablet 0  . sildenafil (REVATIO) 20 MG tablet TAKE 2-5 TABLETS BY MOUTH 1 HOUR BEFORE AS NEEDED  11  . traMADol (ULTRAM) 50 MG tablet Take 1 tablet (50 mg total) by mouth every 8 (eight) hours  as needed. 30 tablet 0   No current facility-administered medications for this visit.     REVIEW OF SYSTEMS:   Constitutional: Denies fevers, chills or abnormal night sweats Eyes: Denies blurriness of vision, double vision or watery eyes Ears, nose, mouth, throat, and face: Denies mucositis or sore throat Respiratory: Denies cough, dyspnea or wheezes Cardiovascular: Denies palpitation, chest discomfort or lower extremity swelling Gastrointestinal:  Denies nausea, heartburn or change in bowel habits Skin: Denies abnormal skin rashes Lymphatics: Denies new lymphadenopathy  Neurological:Denies numbness, tingling or new weaknesses Behavioral/Psych: Mood is stable, no new changes  All other systems were reviewed with the patient and are negative.  PHYSICAL EXAMINATION: ECOG PERFORMANCE STATUS: 1 - Symptomatic but completely ambulatory  Vitals:   02/23/18 1338  BP: (!) 143/47  Pulse: 67  Resp: 18  Temp: 98.2 F (36.8 C)  SpO2: 100%   Filed Weights   02/23/18 1338  Weight: 264 lb 9.6 oz (120 kg)    GENERAL:alert, no distress and comfortable SKIN: skin color, texture, turgor are normal, no rashes or significant lesions EYES: normal, conjunctiva are pink and non-injected, sclera clear OROPHARYNX:no exudate, normal lips, buccal mucosa, and tongue  NECK: supple, thyroid normal size, non-tender, without nodularity LYMPH:  no palpable lymphadenopathy in the cervical, axillary or inguinal LUNGS: clear to auscultation and percussion with normal breathing effort HEART: regular rate & rhythm and no murmurs with no signs of lower extremity edema ABDOMEN:abdomen soft, non-tender and normal bowel  sounds Musculoskeletal:no cyanosis of digits and no clubbing  PSYCH: alert & oriented x 3 with fluent speech NEURO: no focal motor/sensory deficits  LABORATORY DATA:  I have reviewed the data as listed Lab Results  Component Value Date   WBC 3.2 (L) 02/23/2018   HGB 10.4 (L) 02/23/2018   HCT 30.3 (L) 02/23/2018   MCV 104.2 (H) 02/23/2018   PLT 72 (L) 02/23/2018   Recent Labs    07/24/17 1231 10/21/17 1227 12/24/17 1436  01/09/18 1158 02/11/18 1436 02/23/18 1321  NA 139 135* 137   < > 135 139 135  K 4.4 4.0 5.0   < > 4.2 4.8 4.8  CL 104 102 101   < > 96 99 97*  CO2 '28 25 31   ' < > 32 33* 31  GLUCOSE 121 125 113*   < > 97 89 129*  BUN '13 12 15   ' < > 17 25* 24*  CREATININE 0.81 0.84 1.02   < > 1.07 1.31 1.73*  CALCIUM 8.9 9.3 9.2   < > 8.7 10.0 9.3  GFRNONAA >60 >60  --   --   --   --  39*  GFRAA >60 >60  --   --   --   --  45*  PROT 6.6 6.6 6.5  --   --  6.7 6.6  ALBUMIN 2.9* 3.3* 3.6  --   --  3.6 3.1*  AST 117* 49* 55*  --   --  49* 54*  ALT 35 20 18  --   --  19 19  ALKPHOS 123 89 77  --   --  86 134*  BILITOT 5.3* 3.3* 5.0*  --   --  5.1* 3.2*  BILIDIR  --   --  2.0*  --   --  1.7*  --    < > = values in this interval not displayed.   ASSESSMENT & PLAN:  Alcoholic liver disease ([place]) The pattern of pancytopenia and prior bone marrow  biopsy have excluded myelodysplastic syndrome as the cause of pancytopenia The patient admits that he has recurrent heavy alcohol use over the past few weeks due to concern for his wife's well-being I recommend alcohol cessation if possible I plan to see him back again in 3 months for further follow-up  Vitamin D deficiency He had significant back pain that limits his ability to exercise Serum vitamin D is low I recommend high-dose oral replacement therapy and I plan to recheck again in 3 months  Folate deficiency Recent blood work revealed folate deficiency I recommend high-dose oral replacement therapy  Other pancytopenia  ([place]) He is not symptomatic from pancytopenia I recommend him to watch for signs and symptoms of bleeding due to thrombocytopenia  Acute prerenal failure ([place]) He had mild acute on chronic renal failure secondary to high-dose diuretic therapy that was prescribed for leg edema We will contact his primary care doctor to see if adjustment of furosemide further would make sense  Morbid obesity due to excess calories ([place]) The patient is morbidly obese We had extensive discussion about the importance of dietary modification and exercise as tolerated He appears motivated to lose some weight I suspect this would help him in the long run.   Orders Placed This Encounter  Procedures  . VITAMIN D 25 Hydroxy (Vit-D Deficiency, Fractures)    Standing Status:   Future    Number of Occurrences:   1    Standing Expiration Date:   02/24/2019  . Vitamin D 25 hydroxy    Standing Status:   Future    Standing Expiration Date:   02/23/2019    All questions were answered. The patient knows to call the clinic with any problems, questions or concerns. I spent 25 minutes counseling the patient face to face. The total time spent in the appointment was 30 minutes and more than 50% was on counseling.     Heath Lark, MD 02/25/2018 9:46 AM

## 2018-02-25 NOTE — Assessment & Plan Note (Signed)
The patient is morbidly obese We had extensive discussion about the importance of dietary modification and exercise as tolerated He appears motivated to lose some weight I suspect this would help him in the long run.

## 2018-02-25 NOTE — Assessment & Plan Note (Signed)
Recent blood work revealed folate deficiency I recommend high-dose oral replacement therapy

## 2018-02-25 NOTE — Assessment & Plan Note (Signed)
He had significant back pain that limits his ability to exercise Serum vitamin D is low I recommend high-dose oral replacement therapy and I plan to recheck again in 3 months

## 2018-02-25 NOTE — Telephone Encounter (Signed)
Called Dr. Virgil Benedict office and talked with staff per Dr. Alvy Bimler. Given creatinine results of 1.73 on 8/26 when he was seen in the office. His leg edema looked better and he is currently on Lasix. Staff will give message to Dr. Birdie Riddle.

## 2018-02-26 NOTE — Telephone Encounter (Signed)
Pt continues to disagree w/ my recommendations for his best plan of care (has not had abdominal CT, doesn't want to come for an appt to recheck elevated Cr).  The next time this is an issue he will be dismissed due to lack of trust in the doctor/patient relationship

## 2018-03-03 ENCOUNTER — Encounter: Payer: Self-pay | Admitting: Family Medicine

## 2018-03-03 ENCOUNTER — Ambulatory Visit (INDEPENDENT_AMBULATORY_CARE_PROVIDER_SITE_OTHER): Payer: PPO | Admitting: Family Medicine

## 2018-03-03 ENCOUNTER — Other Ambulatory Visit: Payer: Self-pay

## 2018-03-03 VITALS — BP 121/66 | HR 79 | Temp 98.1°F | Resp 16 | Ht 71.0 in | Wt 263.2 lb

## 2018-03-03 DIAGNOSIS — R7989 Other specified abnormal findings of blood chemistry: Secondary | ICD-10-CM | POA: Diagnosis not present

## 2018-03-03 DIAGNOSIS — Z23 Encounter for immunization: Secondary | ICD-10-CM | POA: Diagnosis not present

## 2018-03-03 LAB — BASIC METABOLIC PANEL
BUN: 26 mg/dL — ABNORMAL HIGH (ref 6–23)
CHLORIDE: 97 meq/L (ref 96–112)
CO2: 27 meq/L (ref 19–32)
Calcium: 9.3 mg/dL (ref 8.4–10.5)
Creatinine, Ser: 1.51 mg/dL — ABNORMAL HIGH (ref 0.40–1.50)
GFR: 48.94 mL/min — ABNORMAL LOW (ref >60.00)
Glucose, Bld: 119 mg/dL — ABNORMAL HIGH (ref 70–99)
POTASSIUM: 4.8 meq/L (ref 3.5–5.1)
Sodium: 134 mEq/L — ABNORMAL LOW (ref 135–145)

## 2018-03-03 MED ORDER — ONDANSETRON HCL 4 MG PO TABS: 4.0000 mg | ORAL_TABLET | Freq: Three times a day (TID) | ORAL | 0 refills | Status: DC | PRN

## 2018-03-03 NOTE — Progress Notes (Signed)
   Subjective:    Patient ID: Timothy Mckinney, male    DOB: 05-31-1951, 67 y.o.   MRN: [id number]  HPI Elevated Cr- on 7/12 Cr was 1.07 and Lasix was increased to 40mg  BID to address his swelling.  On 8/14 Cr was up to 1.3 and we decreased Lasix to 40mg /20mg  as swelling had improved.  On 8/26 Cr was up to 1.73 and we decreased Lasix to 20mg /20mg .  Pt is here today to repeat Cr.  BP is well controlled.  He is down another 2 lbs.  Swelling has not worsened w/ lower dose of Lasix.  No increased SOB.  Continues to urinate 2-3x/night but admits to poor water intake prior to seeing Oncology 8 days ago.  Pt has increased water intake.  Continues to take Meloxicam once daily.   Review of Systems For ROS see HPI     Objective:   Physical Exam  Constitutional: He is oriented to person, place, and time. He appears well-developed and well-nourished. No distress.  obese  HENT:  Head: Normocephalic and atraumatic.  Eyes: Pupils are equal, round, and reactive to light. Conjunctivae and EOM are normal.  Neck: Normal range of motion. Neck supple. No thyromegaly present.  Cardiovascular: Normal rate, regular rhythm, normal heart sounds and intact distal pulses.  No murmur heard. Pulmonary/Chest: Effort normal and breath sounds normal. No respiratory distress.  Abdominal: Soft. Bowel sounds are normal. He exhibits no distension.  Musculoskeletal: He exhibits no edema.  Lymphadenopathy:    He has no cervical adenopathy.  Neurological: He is alert and oriented to person, place, and time. No cranial nerve deficit.  Skin: Skin is warm and dry.  Psychiatric: He has a normal mood and affect. His behavior is normal.  Vitals reviewed.         Assessment & Plan:  Elevated serum Cr- new.  Pt's Cr has increased from 1.07 -->1.73 since we started diuresing him.  He is going to eliminate the PM Lasix dose, continue his increased water intake and we will monitor his Cr closely.  We discussed that he may need to  stop his Meloxicam if the Cr doesn't improve.  He was not pleased w/ this possibility.  Will follow.

## 2018-03-03 NOTE — Patient Instructions (Signed)
We'll notify you of your lab results and make any changes if needed DECREASE the Furosemide (Lasix) to 20mg  once daily.  Eliminate the afternoon dose Continue to drink plenty of water Call with any questions or concerns Happy Fall!!!

## 2018-03-04 ENCOUNTER — Other Ambulatory Visit: Payer: Self-pay

## 2018-03-04 DIAGNOSIS — R7989 Other specified abnormal findings of blood chemistry: Secondary | ICD-10-CM

## 2018-03-10 ENCOUNTER — Telehealth: Payer: Self-pay | Admitting: Emergency Medicine

## 2018-03-10 NOTE — Telephone Encounter (Signed)
Reviewing in PCP absence. Dr. Hardin Negus is a pain specialist with [facility] if we are thinking about the same provider. He may be very helpful with long-term pain but if this is a recent injury, a sports medicine physician or orthopedist would be the next best step.

## 2018-03-10 NOTE — Telephone Encounter (Signed)
Please advise  Copied from [place] [id number]. Topic: Quick Communication - See Telephone Encounter >> Mar 10, 2018 11:53 AM Antonieta Iba C wrote: CRM for notification. See Telephone encounter for: 03/10/18.  Pt says that he hurt his back this week and was recommended to see Dr. Nicholaus Bloom. Pt would like to know if provider has ever heard of him and also her thoughts about him? Pt would like to be advised.   CB: [phone]

## 2018-03-10 NOTE — Telephone Encounter (Signed)
Called patient and gave him message from [place]. Patient stated that he has had this back pain since over a year ago when he fell in the bathtub. Patient stated that he has an appointment with Dr. Birdie Riddle on Thursday and wanted to wait until he sees her and ask her at his appointment. I advised patient to call back if his condition worsens before Thursday.

## 2018-03-12 ENCOUNTER — Other Ambulatory Visit (INDEPENDENT_AMBULATORY_CARE_PROVIDER_SITE_OTHER): Payer: PPO

## 2018-03-12 DIAGNOSIS — R7989 Other specified abnormal findings of blood chemistry: Secondary | ICD-10-CM

## 2018-03-12 LAB — BASIC METABOLIC PANEL
BUN: 18 mg/dL (ref 6–23)
CALCIUM: 9.5 mg/dL (ref 8.4–10.5)
CHLORIDE: 99 meq/L (ref 96–112)
CO2: 29 meq/L (ref 19–32)
Creatinine, Ser: 1.6 mg/dL — ABNORMAL HIGH (ref 0.40–1.50)
GFR: 45.77 mL/min — ABNORMAL LOW (ref >60.00)
Glucose, Bld: 126 mg/dL — ABNORMAL HIGH (ref 70–99)
Potassium: 5.4 mEq/L — ABNORMAL HIGH (ref 3.5–5.1)
SODIUM: 137 meq/L (ref 135–145)

## 2018-03-13 ENCOUNTER — Other Ambulatory Visit: Payer: Self-pay | Admitting: Emergency Medicine

## 2018-03-13 DIAGNOSIS — R748 Abnormal levels of other serum enzymes: Secondary | ICD-10-CM

## 2018-03-18 ENCOUNTER — Other Ambulatory Visit: Payer: Self-pay | Admitting: Family Medicine

## 2018-03-18 DIAGNOSIS — I10 Essential (primary) hypertension: Secondary | ICD-10-CM

## 2018-04-02 ENCOUNTER — Other Ambulatory Visit (INDEPENDENT_AMBULATORY_CARE_PROVIDER_SITE_OTHER): Payer: PPO

## 2018-04-02 DIAGNOSIS — R748 Abnormal levels of other serum enzymes: Secondary | ICD-10-CM

## 2018-04-02 LAB — BASIC METABOLIC PANEL
BUN: 8 mg/dL (ref 6–23)
CO2: 31 mEq/L (ref 19–32)
Calcium: 8.8 mg/dL (ref 8.4–10.5)
Chloride: 102 mEq/L (ref 96–112)
Creatinine, Ser: 0.78 mg/dL (ref 0.40–1.50)
GFR: 104.86 mL/min (ref >60.00)
Glucose, Bld: 116 mg/dL — ABNORMAL HIGH (ref 70–99)
POTASSIUM: 3.9 meq/L (ref 3.5–5.1)
SODIUM: 137 meq/L (ref 135–145)

## 2018-04-23 ENCOUNTER — Telehealth: Payer: Self-pay | Admitting: General Practice

## 2018-04-23 DIAGNOSIS — N200 Calculus of kidney: Secondary | ICD-10-CM

## 2018-04-23 NOTE — Telephone Encounter (Signed)
Patient has a history of kidney stones. Ok to place referral to another provider?  Copied from [place] [id number]. Topic: Referral - Request for Referral >> Apr 22, 2018  4:47 PM Reyne Dumas L wrote: Has patient seen PCP for this complaint? No.  But states he has been there plenty in the past and did not want to schedule OV for a referral.   *If NO, is insurance requiring patient see PCP for this issue before PCP can refer them? Referral for which specialty: Urologist Preferred provider/office: Does NOT want to see Dr. Theresia Majors in [facility] Reason for referral: urologist - passing kidney stones  Pt can be reached at [phone]

## 2018-04-24 NOTE — Telephone Encounter (Signed)
Ok to refer to urology for kidney stones

## 2018-04-24 NOTE — Telephone Encounter (Signed)
Referral placed.

## 2018-04-24 NOTE — Addendum Note (Signed)
Addended by: Davis Gourd on: 04/24/2018 09:20 AM   Modules accepted: Orders

## 2018-04-27 ENCOUNTER — Telehealth: Payer: Self-pay | Admitting: Family Medicine

## 2018-04-27 MED ORDER — FUROSEMIDE 40 MG PO TABS: 40.0000 mg | ORAL_TABLET | Freq: Every day | ORAL | 3 refills | Status: DC

## 2018-04-27 NOTE — Telephone Encounter (Signed)
Ok to restart Lasix 40mg  daily but will need appt later this week (Thursday/Friday) to check swelling and labs

## 2018-04-27 NOTE — Telephone Encounter (Signed)
Spoke with patient to let him know that I am calling in medication.   Appointment to check his legs has been made for 04/30/18 at 1:15pm with PCP.  Medication has been sent to pharmacy.

## 2018-04-27 NOTE — Telephone Encounter (Signed)
Copied from [place] [phone]. Topic: Quick Communication - See Telephone Encounter >> Apr 27, 2018  9:45 AM Robina Ade, Helene Kelp D wrote: CRM for notification. See Telephone encounter for: 04/27/18. Patient called and said that he is retaining water in his legs again and want to talk to Dr. Birdie Riddle about it. Please call pt back, thanks.

## 2018-04-27 NOTE — Telephone Encounter (Signed)
Patient states that he has started retaining fluid this weekend from his knees down on both legs.  Patient states that he had this several weeks ago and was put on Lasix for it, but has been weaned off of it.  He denies any SOB, chest pain, states that this is almost identical to 6 weeks ago. He is curious as to if we can just call in the Lasix again for him.   Routing to PCP to advise.  Patient is aware that I will call him back when I have an answer.

## 2018-04-30 ENCOUNTER — Encounter: Payer: Self-pay | Admitting: Family Medicine

## 2018-04-30 ENCOUNTER — Other Ambulatory Visit: Payer: Self-pay

## 2018-04-30 ENCOUNTER — Ambulatory Visit (INDEPENDENT_AMBULATORY_CARE_PROVIDER_SITE_OTHER): Payer: PPO | Admitting: Family Medicine

## 2018-04-30 DIAGNOSIS — R600 Localized edema: Secondary | ICD-10-CM | POA: Diagnosis not present

## 2018-04-30 NOTE — Patient Instructions (Signed)
Follow up as needed or as scheduled Let me know via MyChart how things are going We'll notify you of your lab results and make any changes if needed CONTINUE the Lasix once daily LIMIT your salt intake, increase your water intake, and elevate your legs when sitting Call with any questions or concerns Hang in there!

## 2018-04-30 NOTE — Progress Notes (Signed)
   Subjective:    Patient ID: Timothy Mckinney, male    DOB: Feb 21, 1951, 67 y.o.   MRN: [id number]  HPI Leg swelling- pt reports legs started swelling again over the weekend.  Restarted Lasix 40mg  daily.  Legs were better yesterday but swollen again today.  Legs were initially painful due to swelling but this has improved somewhat.  No swelling of hands.  Denies abd distention or bloating.  No changes to diet recently- no extra salt.  No CP, SOB.   Review of Systems For ROS see HPI     Objective:   Physical Exam  Constitutional: He is oriented to person, place, and time. He appears well-developed and well-nourished. No distress.  HENT:  Head: Normocephalic and atraumatic.  Eyes: Pupils are equal, round, and reactive to light. Conjunctivae and EOM are normal.  Neck: Normal range of motion. Neck supple. No thyromegaly present.  Cardiovascular: Normal rate, regular rhythm, normal heart sounds and intact distal pulses.  No murmur heard. Pulmonary/Chest: Effort normal and breath sounds normal. No respiratory distress.  Abdominal: Soft. Bowel sounds are normal. He exhibits no distension.  Musculoskeletal: He exhibits edema (1+ edema of LEs bilaterally).  Lymphadenopathy:    He has no cervical adenopathy.  Neurological: He is alert and oriented to person, place, and time. No cranial nerve deficit.  Skin: Skin is warm and dry.  Psychiatric: He has a normal mood and affect. His behavior is normal.  Vitals reviewed.         Assessment & Plan:

## 2018-04-30 NOTE — Assessment & Plan Note (Signed)
Deteriorated.  Pt has hx of this that resolved w/ Lasix regimen.  Some improvement since restarting Lasix earlier this week.  Check BMP as pt had transient elevated Cr last time we had to increase Lasix.  Reviewed supportive care and red flags that should prompt return.  Pt expressed understanding and is in agreement w/ plan.

## 2018-05-01 ENCOUNTER — Encounter: Payer: Self-pay | Admitting: General Practice

## 2018-05-01 LAB — BASIC METABOLIC PANEL
BUN: 11 mg/dL (ref 6–23)
CHLORIDE: 96 meq/L (ref 96–112)
CO2: 33 meq/L — AB (ref 19–32)
Calcium: 9.3 mg/dL (ref 8.4–10.5)
Creatinine, Ser: 0.92 mg/dL (ref 0.40–1.50)
GFR: 86.65 mL/min (ref >60.00)
Glucose, Bld: 119 mg/dL — ABNORMAL HIGH (ref 70–99)
Potassium: 4 mEq/L (ref 3.5–5.1)
SODIUM: 137 meq/L (ref 135–145)

## 2018-05-06 ENCOUNTER — Telehealth: Payer: Self-pay | Admitting: *Deleted

## 2018-05-06 NOTE — Telephone Encounter (Signed)
Copied from [place] [phone]. Topic: General - Other >> May 06, 2018  2:35 PM Oneta Rack wrote: Relation to pt: self  Call back number: [phone]   Reason for call:   Patient was last seen 04/30/2018 for swollen legs- patient currently taking Lasix 40mg  for the last 7 days and will d/c tomorrow. Patient states legs have not been swollen for the past 3 days.

## 2018-05-08 DIAGNOSIS — H5203 Hypermetropia, bilateral: Secondary | ICD-10-CM | POA: Diagnosis not present

## 2018-05-12 ENCOUNTER — Telehealth: Payer: Self-pay | Admitting: Hematology and Oncology

## 2018-05-12 NOTE — Telephone Encounter (Signed)
NG out of office 10/30 thru 12/2. Spoke with patient re lab/fu with Dr. Maylon Peppers 11/27. Per patient he would rather wait until NG returns for lab/fu. Moved lab/fu from 11/27 to 12/19 with NG per patient request.

## 2018-05-25 ENCOUNTER — Other Ambulatory Visit: Payer: Self-pay | Admitting: Family Medicine

## 2018-05-26 ENCOUNTER — Ambulatory Visit: Payer: PPO | Admitting: Hematology and Oncology

## 2018-05-26 ENCOUNTER — Other Ambulatory Visit: Payer: PPO

## 2018-05-27 ENCOUNTER — Ambulatory Visit: Payer: PPO | Admitting: Hematology

## 2018-05-27 ENCOUNTER — Other Ambulatory Visit: Payer: PPO

## 2018-06-05 ENCOUNTER — Other Ambulatory Visit: Payer: Self-pay | Admitting: Hematology and Oncology

## 2018-06-05 DIAGNOSIS — D61818 Other pancytopenia: Secondary | ICD-10-CM

## 2018-06-05 DIAGNOSIS — K709 Alcoholic liver disease, unspecified: Secondary | ICD-10-CM

## 2018-06-05 DIAGNOSIS — E559 Vitamin D deficiency, unspecified: Secondary | ICD-10-CM

## 2018-06-10 ENCOUNTER — Ambulatory Visit: Payer: PPO | Admitting: Family Medicine

## 2018-06-12 ENCOUNTER — Other Ambulatory Visit: Payer: Self-pay | Admitting: Family Medicine

## 2018-06-12 DIAGNOSIS — I10 Essential (primary) hypertension: Secondary | ICD-10-CM

## 2018-06-16 DIAGNOSIS — M5136 Other intervertebral disc degeneration, lumbar region: Secondary | ICD-10-CM | POA: Diagnosis not present

## 2018-06-17 DIAGNOSIS — R188 Other ascites: Secondary | ICD-10-CM | POA: Diagnosis not present

## 2018-06-17 DIAGNOSIS — R310 Gross hematuria: Secondary | ICD-10-CM | POA: Diagnosis not present

## 2018-06-17 DIAGNOSIS — I700 Atherosclerosis of aorta: Secondary | ICD-10-CM | POA: Diagnosis not present

## 2018-06-17 DIAGNOSIS — N132 Hydronephrosis with renal and ureteral calculous obstruction: Secondary | ICD-10-CM | POA: Diagnosis not present

## 2018-06-17 DIAGNOSIS — R109 Unspecified abdominal pain: Secondary | ICD-10-CM | POA: Diagnosis not present

## 2018-06-17 DIAGNOSIS — R1031 Right lower quadrant pain: Secondary | ICD-10-CM | POA: Diagnosis not present

## 2018-06-17 DIAGNOSIS — N200 Calculus of kidney: Secondary | ICD-10-CM | POA: Diagnosis not present

## 2018-06-17 DIAGNOSIS — R161 Splenomegaly, not elsewhere classified: Secondary | ICD-10-CM | POA: Diagnosis not present

## 2018-06-17 DIAGNOSIS — R918 Other nonspecific abnormal finding of lung field: Secondary | ICD-10-CM | POA: Diagnosis not present

## 2018-06-17 DIAGNOSIS — K7460 Unspecified cirrhosis of liver: Secondary | ICD-10-CM | POA: Diagnosis not present

## 2018-06-17 DIAGNOSIS — N1330 Unspecified hydronephrosis: Secondary | ICD-10-CM | POA: Diagnosis not present

## 2018-06-17 DIAGNOSIS — Z9689 Presence of other specified functional implants: Secondary | ICD-10-CM | POA: Diagnosis not present

## 2018-06-17 DIAGNOSIS — J90 Pleural effusion, not elsewhere classified: Secondary | ICD-10-CM | POA: Diagnosis not present

## 2018-06-17 DIAGNOSIS — K8020 Calculus of gallbladder without cholecystitis without obstruction: Secondary | ICD-10-CM | POA: Diagnosis not present

## 2018-06-17 DIAGNOSIS — N201 Calculus of ureter: Secondary | ICD-10-CM | POA: Diagnosis not present

## 2018-06-17 DIAGNOSIS — I2510 Atherosclerotic heart disease of native coronary artery without angina pectoris: Secondary | ICD-10-CM | POA: Diagnosis not present

## 2018-06-17 DIAGNOSIS — N202 Calculus of kidney with calculus of ureter: Secondary | ICD-10-CM | POA: Diagnosis not present

## 2018-06-18 ENCOUNTER — Inpatient Hospital Stay: Payer: PPO | Admitting: Hematology and Oncology

## 2018-06-18 ENCOUNTER — Inpatient Hospital Stay: Payer: PPO

## 2018-06-18 DIAGNOSIS — Z01818 Encounter for other preprocedural examination: Secondary | ICD-10-CM | POA: Diagnosis not present

## 2018-06-18 DIAGNOSIS — N201 Calculus of ureter: Secondary | ICD-10-CM | POA: Diagnosis not present

## 2018-06-18 DIAGNOSIS — N202 Calculus of kidney with calculus of ureter: Secondary | ICD-10-CM | POA: Diagnosis not present

## 2018-06-18 DIAGNOSIS — N200 Calculus of kidney: Secondary | ICD-10-CM | POA: Diagnosis not present

## 2018-06-22 ENCOUNTER — Ambulatory Visit: Payer: PPO | Admitting: Family Medicine

## 2018-06-25 ENCOUNTER — Inpatient Hospital Stay: Payer: PPO | Admitting: Hematology and Oncology

## 2018-06-25 ENCOUNTER — Inpatient Hospital Stay: Payer: PPO

## 2018-07-06 ENCOUNTER — Telehealth: Payer: Self-pay | Admitting: Hematology and Oncology

## 2018-07-06 ENCOUNTER — Telehealth: Payer: Self-pay

## 2018-07-06 ENCOUNTER — Inpatient Hospital Stay: Payer: PPO

## 2018-07-06 ENCOUNTER — Inpatient Hospital Stay (HOSPITAL_BASED_OUTPATIENT_CLINIC_OR_DEPARTMENT_OTHER): Payer: PPO | Admitting: Hematology and Oncology

## 2018-07-06 ENCOUNTER — Other Ambulatory Visit: Payer: Self-pay | Admitting: Hematology and Oncology

## 2018-07-06 ENCOUNTER — Encounter: Payer: Self-pay | Admitting: Hematology and Oncology

## 2018-07-06 ENCOUNTER — Inpatient Hospital Stay: Payer: PPO | Attending: Hematology and Oncology

## 2018-07-06 VITALS — BP 113/40 | HR 79 | Temp 98.0°F | Resp 18 | Ht 71.0 in | Wt 262.0 lb

## 2018-07-06 DIAGNOSIS — R161 Splenomegaly, not elsewhere classified: Secondary | ICD-10-CM | POA: Insufficient documentation

## 2018-07-06 DIAGNOSIS — E538 Deficiency of other specified B group vitamins: Secondary | ICD-10-CM | POA: Diagnosis not present

## 2018-07-06 DIAGNOSIS — D61818 Other pancytopenia: Secondary | ICD-10-CM

## 2018-07-06 DIAGNOSIS — Z791 Long term (current) use of non-steroidal anti-inflammatories (NSAID): Secondary | ICD-10-CM | POA: Insufficient documentation

## 2018-07-06 DIAGNOSIS — K700 Alcoholic fatty liver: Secondary | ICD-10-CM | POA: Diagnosis not present

## 2018-07-06 DIAGNOSIS — K7200 Acute and subacute hepatic failure without coma: Secondary | ICD-10-CM | POA: Insufficient documentation

## 2018-07-06 DIAGNOSIS — Z79899 Other long term (current) drug therapy: Secondary | ICD-10-CM

## 2018-07-06 DIAGNOSIS — D689 Coagulation defect, unspecified: Secondary | ICD-10-CM

## 2018-07-06 DIAGNOSIS — R58 Hemorrhage, not elsewhere classified: Secondary | ICD-10-CM | POA: Insufficient documentation

## 2018-07-06 DIAGNOSIS — N200 Calculus of kidney: Secondary | ICD-10-CM | POA: Diagnosis not present

## 2018-07-06 DIAGNOSIS — Z880 Allergy status to penicillin: Secondary | ICD-10-CM | POA: Insufficient documentation

## 2018-07-06 DIAGNOSIS — K625 Hemorrhage of anus and rectum: Secondary | ICD-10-CM

## 2018-07-06 DIAGNOSIS — K709 Alcoholic liver disease, unspecified: Secondary | ICD-10-CM

## 2018-07-06 DIAGNOSIS — D500 Iron deficiency anemia secondary to blood loss (chronic): Secondary | ICD-10-CM | POA: Insufficient documentation

## 2018-07-06 DIAGNOSIS — E559 Vitamin D deficiency, unspecified: Secondary | ICD-10-CM

## 2018-07-06 LAB — LACTATE DEHYDROGENASE: LDH: 193 U/L — ABNORMAL HIGH (ref 98–192)

## 2018-07-06 LAB — COMPREHENSIVE METABOLIC PANEL
ALT: 17 U/L (ref 0–44)
ANION GAP: 6 (ref 5–15)
AST: 51 U/L — ABNORMAL HIGH (ref 15–41)
Albumin: 2.6 g/dL — ABNORMAL LOW (ref 3.5–5.0)
Alkaline Phosphatase: 101 U/L (ref 38–126)
BUN: 12 mg/dL (ref 8–23)
CO2: 27 mmol/L (ref 22–32)
Calcium: 8.5 mg/dL — ABNORMAL LOW (ref 8.9–10.3)
Chloride: 104 mmol/L (ref 98–111)
Creatinine, Ser: 0.83 mg/dL (ref 0.61–1.24)
GFR calc Af Amer: >60 mL/min (ref >60)
GFR calc non Af Amer: >60 mL/min (ref >60)
Glucose, Bld: 127 mg/dL — ABNORMAL HIGH (ref 70–99)
Potassium: 3.7 mmol/L (ref 3.5–5.1)
Sodium: 137 mmol/L (ref 135–145)
Total Bilirubin: 4.8 mg/dL (ref 0.3–1.2)
Total Protein: 5.9 g/dL — ABNORMAL LOW (ref 6.5–8.1)

## 2018-07-06 LAB — CBC WITH DIFFERENTIAL/PLATELET
Abs Immature Granulocytes: 0.01 10*3/uL (ref 0.00–0.07)
BASOS ABS: 0 10*3/uL (ref 0.0–0.1)
Basophils Relative: 1 %
Eosinophils Absolute: 0.2 10*3/uL (ref 0.0–0.5)
Eosinophils Relative: 7 %
HCT: 28 % — ABNORMAL LOW (ref 39.0–52.0)
Hemoglobin: 9.1 g/dL — ABNORMAL LOW (ref 13.0–17.0)
Immature Granulocytes: 0 %
Lymphocytes Relative: 27 %
Lymphs Abs: 0.8 10*3/uL (ref 0.7–4.0)
MCH: 35.1 pg — ABNORMAL HIGH (ref 26.0–34.0)
MCHC: 32.5 g/dL (ref 30.0–36.0)
MCV: 108.1 fL — ABNORMAL HIGH (ref 80.0–100.0)
Monocytes Absolute: 0.2 10*3/uL (ref 0.1–1.0)
Monocytes Relative: 8 %
Neutro Abs: 1.6 10*3/uL — ABNORMAL LOW (ref 1.7–7.7)
Neutrophils Relative %: 57 %
Platelets: 56 10*3/uL — ABNORMAL LOW (ref 150–400)
RBC: 2.59 MIL/uL — ABNORMAL LOW (ref 4.22–5.81)
RDW: 14.6 % (ref 11.5–15.5)
WBC: 2.8 10*3/uL — AB (ref 4.0–10.5)
nRBC: 0 % (ref 0.0–0.2)

## 2018-07-06 LAB — PROTIME-INR
INR: 1.61
Prothrombin Time: 19 seconds — ABNORMAL HIGH (ref 11.4–15.2)

## 2018-07-06 LAB — IRON AND TIBC
Iron: 244 ug/dL — ABNORMAL HIGH (ref 42–163)
SATURATION RATIOS: 100 % — AB (ref 20–55)
TIBC: 244 ug/dL (ref 202–409)
UIBC: 0 ug/dL — ABNORMAL LOW (ref 117–376)

## 2018-07-06 LAB — APTT: aPTT: 42 seconds — ABNORMAL HIGH (ref 24–36)

## 2018-07-06 LAB — FIBRINOGEN: Fibrinogen: 215 mg/dL (ref 210–475)

## 2018-07-06 LAB — FERRITIN: Ferritin: 72 ng/mL (ref 24–336)

## 2018-07-06 LAB — ABO/RH: ABO/RH(D): O POS

## 2018-07-06 MED ORDER — DIPHENHYDRAMINE HCL 25 MG PO CAPS
25.0000 mg | ORAL_CAPSULE | Freq: Once | ORAL | Status: AC
  Administered 2018-07-06: 25 mg via ORAL

## 2018-07-06 MED ORDER — ACETAMINOPHEN 325 MG PO TABS
ORAL_TABLET | ORAL | Status: AC
  Filled 2018-07-06: qty 2

## 2018-07-06 MED ORDER — DIPHENHYDRAMINE HCL 25 MG PO CAPS
ORAL_CAPSULE | ORAL | Status: AC
  Filled 2018-07-06: qty 1

## 2018-07-06 MED ORDER — SODIUM CHLORIDE 0.9% IV SOLUTION
250.0000 mL | Freq: Once | INTRAVENOUS | Status: AC
  Administered 2018-07-06: 250 mL via INTRAVENOUS
  Filled 2018-07-06: qty 250

## 2018-07-06 MED ORDER — ACETAMINOPHEN 325 MG PO TABS
650.0000 mg | ORAL_TABLET | Freq: Once | ORAL | Status: AC
  Administered 2018-07-06: 650 mg via ORAL

## 2018-07-06 NOTE — Telephone Encounter (Signed)
Called wife back. Per Dr. Alvy Bimler given appt for 11 am lab and see Dr. Alvy Bimler at [place], with appt for platelets to follow if the infusion room has appt time. Wife verbalized understanding.

## 2018-07-06 NOTE — Assessment & Plan Note (Signed)
He has kidney stones requiring lithotripsy surgery I have discussed this with his urologist As soon as I am able to correct his thrombocytopenia and coagulopathy, he should be able to proceed with surgery tomorrow I will provide a letter of clearance for him tomorrow

## 2018-07-06 NOTE — Assessment & Plan Note (Signed)
He is mildly coagulopathic APTT and PT/INR were mildly elevated but he has normal fibrinogen level I plan to recheck it again tomorrow before his surgery

## 2018-07-06 NOTE — Assessment & Plan Note (Signed)
The patient desired to have his surgery for kidney stone as soon as possible I have discussed this with his urologist He needs his platelet count 60,000 and above to proceed with treatment Due to his alcoholic liver disease, splenomegaly and consumption from bleeding, it is not clear to me that his platelet count will stay high despite platelet transfusion Given his weight, I recommend 2 units of platelet transfusion today to get his platelet count up  We discussed some of the risks, benefits, and alternatives of platelets transfusions. The patient is symptomatic from low platelet counts with bruising/bleeding/at high risk of life-threatening bleeding and the platelet count is critically low.  Some of the side-effects to be expected including risks of transfusion reactions, chills, infection, syndrome of volume overload and risk of hospitalization from various reasons and the patient is willing to proceed and went ahead to sign consent today. I recommend repeat platelet count tomorrow early morning prior to surgery I plan to see him again Wednesday morning after surgery to see if he needs any more platelet transfusion

## 2018-07-06 NOTE — Telephone Encounter (Signed)
Called wife. Per Dr. Alvy Bimler, she talked with urologist. 2 bags of platelets to be given today. Scheduling message sent for lab work at [facility], to wait in lobby until someone in the office comes and talks with them. Dr. Alvy Bimler will see patient again on Wednesday. Nothing to eat after midnight today for procedure tomorrow. Wife verbalized understanding.

## 2018-07-06 NOTE — Telephone Encounter (Signed)
Wife called and left a message. Husband is at [facility] today trying to have a lithotripsy procedure. His platelets are too low today to have procedure. She is requesting appt today with Dr. Alvy Bimler and platelet infusion today. If he gets the infusion today. He will be able to have lithotripsy tomorrow.

## 2018-07-06 NOTE — Telephone Encounter (Signed)
Gave avs and calendar ° °

## 2018-07-06 NOTE — Assessment & Plan Note (Signed)
He has signs of liver failure with elevated total bilirubin level and abnormal coagulation study We discussed the importance of staying abstinent from alcoholism

## 2018-07-06 NOTE — Progress Notes (Signed)
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 The patient is symptomatic from low platelet counts with bruising/bleeding/at high risk of life-threatening bleeding and the platelet count is critically low.  Some of the side-effects to be expected including risks of transfusion reactions, chills, infection, syndrome of volume overload and risk of hospitalization from various reasons and the patient is willing to proceed and went ahead to sign consent today. I recommend repeat platelet count tomorrow early morning prior to surgery I plan to see him again Wednesday morning after surgery to see if he needs any more platelet transfusion  Alcoholic liver disease ([place]) He has signs of liver failure with elevated total bilirubin level and abnormal coagulation study We discussed the importance of staying abstinent from alcoholism  Coagulopathy ([place]) He is mildly coagulopathic APTT and PT/INR were mildly elevated but he has normal fibrinogen level I plan to recheck it again tomorrow before his surgery  Kidney stones He has kidney stones requiring lithotripsy surgery I have discussed this with his urologist As soon as I am able to correct his thrombocytopenia and coagulopathy, he should be able to proceed with surgery tomorrow I will provide a letter of clearance for him tomorrow   Orders  Placed This Encounter  Procedures  . Protime-INR    Standing Status:   Future    Standing Expiration Date:   08/10/2019  . Prepare Pheresed Platelets    Standing Status:   Standing    Number of Occurrences:   1    Order Specific Question:   Number of Apheresis Units    Answer:   2 units (12-20 packs)    Order Specific Question:   Transfusion Indications    Answer:   Bleeding from Thrombocytopenia    Order Specific Question:   Special Requirements    Answer:   Irradiated    INTERVAL HISTORY: Timothy Mckinney 68 y.o. male returns for urgent visit His lithotripsy was canceled today He has canceled his last 2 appointment to see me because he has been feeling unwell due to presence of kidney stone He continues to drink alcohol He has ongoing bleeding due to presence of kidney stone He denies epistaxis or melena or hematochezia He denies recent infection, fever or chills  SUMMARY OF HEMATOLOGIC HISTORY: Timothy Mckinney 68 y.o. male was transferred to my care after his prior physician has left.  I reviewed the patient's records extensive and collaborated the history with the patient. Summary of his history is as follows: The patient was seen by another physician for evaluation of pancytopenia.   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  ALLERGIES:  is allergic to penicillins.  MEDICATIONS:  Current Outpatient Medications  Medication Sig Dispense Refill  . cetirizine (ZYRTEC) 10 MG tablet Take 1 tablet (10 mg total) by  mouth daily. 30 tablet 11  . clindamycin (CLEOCIN) 150 MG capsule TK 4 CAPSULES PO 1 H PRIOR TO APPT  0  . cycloSPORINE (RESTASIS) 0.05 % ophthalmic emulsion Place 1 drop into both eyes daily.    . diphenhydrAMINE (BENADRYL) 25 MG tablet Take 25 mg by mouth. ONE AT BEDTIME IF NEEDED SLEEP    . fluticasone (FLONASE) 50 MCG/ACT nasal spray Place 2 sprays into both nostrils daily. 16 g 6  . furosemide (LASIX) 40 MG tablet Take 1 tablet (40 mg total) by mouth daily. 60 tablet 3  . Loperamide HCl (IMODIUM PO) Take by mouth daily.    . meloxicam (MOBIC) 15 MG tablet TAKE 1 TABLET BY MOUTH ONCE DAILY 90 tablet 0  . metoprolol succinate (TOPROL-XL) 50 MG 24 hr tablet TAKE 1 TABLET BY MOUTH EVERY DAY 90 tablet 0  . montelukast (SINGULAIR) 10 MG tablet Take 1 tablet (10 mg total) by mouth at bedtime. 30 tablet 3  . NONFORMULARY OR COMPOUNDED ITEM Take 2 drops by mouth 2 (two) times daily.    . ondansetron (ZOFRAN) 4 MG tablet TAKE 1 TABLET(4 MG) BY MOUTH EVERY 8 HOURS AS NEEDED FOR NAUSEA OR VOMITING 20 tablet 0  . sildenafil (REVATIO) 20 MG tablet TAKE 2-5 TABLETS BY MOUTH 1 HOUR BEFORE AS NEEDED  11  . sildenafil (VIAGRA) 100 MG tablet TAKE 1 TABLET BY MOUTH ONCE DAILY AS NEEDED FOR ERECTILE DYSFUNCTION  3  . traMADol (ULTRAM) 50 MG tablet Take 1 tablet (50 mg total) by mouth every 8 (eight) hours as needed. 30 tablet 0   No current facility-administered medications for this visit.      REVIEW OF SYSTEMS:   Constitutional: Denies fevers, chills or night sweats Eyes: Denies blurriness of vision Ears, nose, mouth, throat, and face: Denies mucositis or sore throat Respiratory: Denies cough, dyspnea or wheezes Cardiovascular: Denies palpitation, chest discomfort or lower extremity swelling Gastrointestinal:  Denies nausea, heartburn or change in bowel habits Skin: Denies abnormal skin rashes Lymphatics: Denies new lymphadenopathy or easy bruising Neurological:Denies numbness, tingling or new  weaknesses Behavioral/Psych: Mood is stable, no new changes  All other systems were reviewed with the patient and are negative.  PHYSICAL EXAMINATION: ECOG PERFORMANCE STATUS: 1 - Symptomatic but completely ambulatory  Vitals:   07/06/18 1227  BP: (!) 113/40  Pulse: 79  Resp: 18  Temp: 98 F (36.7 C)  SpO2: 100%   Filed Weights   07/06/18 1227  Weight: 262 lb (118.8 kg)    GENERAL:alert, no distress and comfortable Musculoskeletal:no cyanosis of digits and no clubbing  NEURO: alert & oriented x 3 with fluent speech, no focal motor/sensory deficits  LABORATORY DATA:  I have reviewed the data as listed     Component Value Date/Time   NA 137 07/06/2018 1156   NA 136 07/04/2017 1532   K 3.7 07/06/2018 1156   K 4.3 07/04/2017 1532   CL 104 07/06/2018 1156   CO2 27 07/06/2018 1156   CO2 28 07/04/2017 1532   GLUCOSE 127 (H) 07/06/2018 1156   GLUCOSE 168 (H) 07/04/2017 1532   BUN 12 07/06/2018 1156   BUN 9.7 07/04/2017 1532  CREATININE 0.83 07/06/2018 1156   CREATININE 1.73 (H) 02/23/2018 1321   CREATININE 1.04 01/02/2018 1546   CREATININE 0.8 07/04/2017 1532   CALCIUM 8.5 (L) 07/06/2018 1156   CALCIUM 8.5 07/04/2017 1532   PROT 5.9 (L) 07/06/2018 1156   PROT 6.5 07/04/2017 1532   ALBUMIN 2.6 (L) 07/06/2018 1156   ALBUMIN 2.9 (L) 07/04/2017 1532   AST 51 (H) 07/06/2018 1156   AST 54 (H) 02/23/2018 1321   AST 100 (H) 07/04/2017 1532   ALT 17 07/06/2018 1156   ALT 19 02/23/2018 1321   ALT 26 07/04/2017 1532   ALKPHOS 101 07/06/2018 1156   ALKPHOS 125 07/04/2017 1532   BILITOT 4.8 (HH) 07/06/2018 1156   BILITOT 3.2 (H) 02/23/2018 1321   BILITOT 4.59 (HH) 07/04/2017 1532   GFRNONAA >60 07/06/2018 1156   GFRNONAA 39 (L) 02/23/2018 1321   GFRAA >60 07/06/2018 1156   GFRAA 45 (L) 02/23/2018 1321    No results found for: SPEP, UPEP  Lab Results  Component Value Date   WBC 2.8 (L) 07/06/2018   NEUTROABS 1.6 (L) 07/06/2018   HGB 9.1 (L) 07/06/2018   HCT 28.0  (L) 07/06/2018   MCV 108.1 (H) 07/06/2018   PLT 56 (L) 07/06/2018      Chemistry      Component Value Date/Time   NA 137 07/06/2018 1156   NA 136 07/04/2017 1532   K 3.7 07/06/2018 1156   K 4.3 07/04/2017 1532   CL 104 07/06/2018 1156   CO2 27 07/06/2018 1156   CO2 28 07/04/2017 1532   BUN 12 07/06/2018 1156   BUN 9.7 07/04/2017 1532   CREATININE 0.83 07/06/2018 1156   CREATININE 1.73 (H) 02/23/2018 1321   CREATININE 1.04 01/02/2018 1546   CREATININE 0.8 07/04/2017 1532      Component Value Date/Time   CALCIUM 8.5 (L) 07/06/2018 1156   CALCIUM 8.5 07/04/2017 1532   ALKPHOS 101 07/06/2018 1156   ALKPHOS 125 07/04/2017 1532   AST 51 (H) 07/06/2018 1156   AST 54 (H) 02/23/2018 1321   AST 100 (H) 07/04/2017 1532   ALT 17 07/06/2018 1156   ALT 19 02/23/2018 1321   ALT 26 07/04/2017 1532   BILITOT 4.8 (HH) 07/06/2018 1156   BILITOT 3.2 (H) 02/23/2018 1321   BILITOT 4.59 (HH) 07/04/2017 1532      I spent 30 minutes counseling the patient face to face. The total time spent in the appointment was 40 minutes and more than 50% was on counseling.   All questions were answered. The patient knows to call the clinic with any problems, questions or concerns. No barriers to learning was detected.    Heath Lark, MD 1/6/20202:36 PM

## 2018-07-06 NOTE — Patient Instructions (Signed)

## 2018-07-07 ENCOUNTER — Encounter: Payer: Self-pay | Admitting: Hematology and Oncology

## 2018-07-07 ENCOUNTER — Inpatient Hospital Stay: Payer: PPO

## 2018-07-07 DIAGNOSIS — K625 Hemorrhage of anus and rectum: Secondary | ICD-10-CM

## 2018-07-07 DIAGNOSIS — D61818 Other pancytopenia: Secondary | ICD-10-CM

## 2018-07-07 DIAGNOSIS — N200 Calculus of kidney: Secondary | ICD-10-CM | POA: Diagnosis not present

## 2018-07-07 DIAGNOSIS — K709 Alcoholic liver disease, unspecified: Secondary | ICD-10-CM

## 2018-07-07 LAB — BPAM PLATELET PHERESIS
Blood Product Expiration Date: 202001072359
Blood Product Expiration Date: 202001072359
ISSUE DATE / TIME: 202001061443
ISSUE DATE / TIME: 202001061533
UNIT TYPE AND RH: 8400
Unit Type and Rh: 600

## 2018-07-07 LAB — COMPREHENSIVE METABOLIC PANEL
ALK PHOS: 100 U/L (ref 38–126)
ALT: 18 U/L (ref 0–44)
AST: 50 U/L — ABNORMAL HIGH (ref 15–41)
Albumin: 2.7 g/dL — ABNORMAL LOW (ref 3.5–5.0)
Anion gap: 10 (ref 5–15)
BUN: 12 mg/dL (ref 8–23)
CO2: 25 mmol/L (ref 22–32)
Calcium: 9 mg/dL (ref 8.9–10.3)
Chloride: 105 mmol/L (ref 98–111)
Creatinine, Ser: 0.9 mg/dL (ref 0.61–1.24)
GFR calc Af Amer: >60 mL/min (ref >60)
Glucose, Bld: 109 mg/dL — ABNORMAL HIGH (ref 70–99)
Potassium: 4.2 mmol/L (ref 3.5–5.1)
Sodium: 140 mmol/L (ref 135–145)
Total Bilirubin: 4.2 mg/dL (ref 0.3–1.2)
Total Protein: 5.9 g/dL — ABNORMAL LOW (ref 6.5–8.1)

## 2018-07-07 LAB — PREPARE PLATELET PHERESIS
Unit division: 0
Unit division: 0

## 2018-07-07 LAB — CBC WITH DIFFERENTIAL/PLATELET
ABS IMMATURE GRANULOCYTES: 0.01 10*3/uL (ref 0.00–0.07)
Basophils Absolute: 0 10*3/uL (ref 0.0–0.1)
Basophils Relative: 1 %
Eosinophils Absolute: 0.2 10*3/uL (ref 0.0–0.5)
Eosinophils Relative: 7 %
HCT: 27.7 % — ABNORMAL LOW (ref 39.0–52.0)
Hemoglobin: 9 g/dL — ABNORMAL LOW (ref 13.0–17.0)
Immature Granulocytes: 0 %
Lymphocytes Relative: 30 %
Lymphs Abs: 0.9 10*3/uL (ref 0.7–4.0)
MCH: 34.9 pg — ABNORMAL HIGH (ref 26.0–34.0)
MCHC: 32.5 g/dL (ref 30.0–36.0)
MCV: 107.4 fL — ABNORMAL HIGH (ref 80.0–100.0)
Monocytes Absolute: 0.3 10*3/uL (ref 0.1–1.0)
Monocytes Relative: 10 %
NEUTROS ABS: 1.7 10*3/uL (ref 1.7–7.7)
Neutrophils Relative %: 52 %
Platelets: 73 10*3/uL — ABNORMAL LOW (ref 150–400)
RBC: 2.58 MIL/uL — ABNORMAL LOW (ref 4.22–5.81)
RDW: 14.6 % (ref 11.5–15.5)
WBC: 3.2 10*3/uL — ABNORMAL LOW (ref 4.0–10.5)
nRBC: 0 % (ref 0.0–0.2)

## 2018-07-07 LAB — VITAMIN D 25 HYDROXY (VIT D DEFICIENCY, FRACTURES): Vit D, 25-Hydroxy: 46.6 ng/mL (ref 30.0–100.0)

## 2018-07-07 LAB — PROTIME-INR
INR: 1.52
Prothrombin Time: 18.2 seconds — ABNORMAL HIGH (ref 11.4–15.2)

## 2018-07-08 ENCOUNTER — Inpatient Hospital Stay: Payer: PPO

## 2018-07-08 ENCOUNTER — Other Ambulatory Visit: Payer: Self-pay | Admitting: Hematology and Oncology

## 2018-07-08 ENCOUNTER — Inpatient Hospital Stay (HOSPITAL_BASED_OUTPATIENT_CLINIC_OR_DEPARTMENT_OTHER): Payer: PPO | Admitting: Hematology and Oncology

## 2018-07-08 ENCOUNTER — Encounter: Payer: Self-pay | Admitting: Hematology and Oncology

## 2018-07-08 ENCOUNTER — Telehealth: Payer: Self-pay | Admitting: Hematology and Oncology

## 2018-07-08 DIAGNOSIS — D61818 Other pancytopenia: Secondary | ICD-10-CM

## 2018-07-08 DIAGNOSIS — K7200 Acute and subacute hepatic failure without coma: Secondary | ICD-10-CM

## 2018-07-08 DIAGNOSIS — K709 Alcoholic liver disease, unspecified: Secondary | ICD-10-CM

## 2018-07-08 DIAGNOSIS — N200 Calculus of kidney: Secondary | ICD-10-CM | POA: Diagnosis not present

## 2018-07-08 DIAGNOSIS — R58 Hemorrhage, not elsewhere classified: Secondary | ICD-10-CM

## 2018-07-08 DIAGNOSIS — D500 Iron deficiency anemia secondary to blood loss (chronic): Secondary | ICD-10-CM

## 2018-07-08 DIAGNOSIS — R161 Splenomegaly, not elsewhere classified: Secondary | ICD-10-CM

## 2018-07-08 DIAGNOSIS — Z79899 Other long term (current) drug therapy: Secondary | ICD-10-CM | POA: Diagnosis not present

## 2018-07-08 DIAGNOSIS — D689 Coagulation defect, unspecified: Secondary | ICD-10-CM

## 2018-07-08 DIAGNOSIS — K625 Hemorrhage of anus and rectum: Secondary | ICD-10-CM

## 2018-07-08 LAB — CBC WITH DIFFERENTIAL/PLATELET
Abs Immature Granulocytes: 0.01 10*3/uL (ref 0.00–0.07)
Basophils Absolute: 0 10*3/uL (ref 0.0–0.1)
Basophils Relative: 1 %
EOS ABS: 0.3 10*3/uL (ref 0.0–0.5)
Eosinophils Relative: 8 %
HCT: 26.6 % — ABNORMAL LOW (ref 39.0–52.0)
Hemoglobin: 8.6 g/dL — ABNORMAL LOW (ref 13.0–17.0)
Immature Granulocytes: 0 %
Lymphocytes Relative: 29 %
Lymphs Abs: 0.9 10*3/uL (ref 0.7–4.0)
MCH: 35.1 pg — ABNORMAL HIGH (ref 26.0–34.0)
MCHC: 32.3 g/dL (ref 30.0–36.0)
MCV: 108.6 fL — ABNORMAL HIGH (ref 80.0–100.0)
Monocytes Absolute: 0.3 10*3/uL (ref 0.1–1.0)
Monocytes Relative: 11 %
NRBC: 0 % (ref 0.0–0.2)
Neutro Abs: 1.6 10*3/uL — ABNORMAL LOW (ref 1.7–7.7)
Neutrophils Relative %: 51 %
Platelets: 72 10*3/uL — ABNORMAL LOW (ref 150–400)
RBC: 2.45 MIL/uL — ABNORMAL LOW (ref 4.22–5.81)
RDW: 14.5 % (ref 11.5–15.5)
WBC: 3.2 10*3/uL — AB (ref 4.0–10.5)

## 2018-07-08 NOTE — Assessment & Plan Note (Signed)
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  Observe only for now

## 2018-07-08 NOTE — Assessment & Plan Note (Signed)
I reviewed his other test results including his recent liver test along with coagulation study The patient had decompensated liver failure, currently at child Pugh score C We discussed prognosis with liver failure Alpha-fetoprotein is pending We discussed referral back to GI specialist for further follow-up but the patient declined The patient is motivated to quit drinking permanently We discussed importance of dietary change and weight loss as well to preserve his liver function

## 2018-07-08 NOTE — Progress Notes (Signed)
[facility] OFFICE PROGRESS NOTE  Timothy Minium, MD  ASSESSMENT & PLAN:  Other pancytopenia Timothy Mckinney) The patient had successful lithotripsy yesterday He continues to have minor bleeding but not excessive He does not need transfusion support today His recent blood work suggested mild iron deficiency anemia, likely due to chronic hematuria I recommend oral iron supplement daily I plan to see him again in a month for further follow-up If his iron studies show no improvement with oral iron supplement, I will consider intravenous iron infusion  Alcoholic liver disease (Timothy Mckinney) I reviewed his other test results including his recent liver test along with coagulation study The patient had decompensated liver failure, currently at child Pugh score C We discussed prognosis with liver failure Alpha-fetoprotein is pending We discussed referral back to GI specialist for further follow-up but the patient declined The patient is motivated to quit drinking permanently We discussed importance of dietary change and weight loss as well to preserve his liver function  Coagulopathy (Timothy Mckinney) 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  Observe only for now   Orders Placed This Encounter  Procedures  . Iron and TIBC    Standing Status:   Standing    Number of Occurrences:   2    Standing Expiration Date:   07/09/2019  . Ferritin    Standing Status:   Standing    Number of Occurrences:   2    Standing Expiration Date:   07/09/2019  . Comprehensive metabolic panel    Standing Status:   Standing    Number of Occurrences:   2    Standing Expiration Date:   07/09/2019    INTERVAL HISTORY: Timothy Mckinney 68 y.o. male returns for further follow-up after lithotripsy He continues to have mild hematuria He denies other forms of bleeding He has not drink alcohol since we have met He appears motivated  SUMMARY OF HEMATOLOGIC HISTORY:  Timothy Mckinney 68 y.o. male was transferred to my care  after his prior physician has left.  I reviewed the patient's records extensive and collaborated the history with the patient. Summary of his history is as follows: The patient was seen by another physician for evaluation of pancytopenia.   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  ALLERGIES:  is allergic to penicillins.  MEDICATIONS:  Current Outpatient Medications  Medication Sig Dispense Refill  . ferrous sulfate 325 (65 FE) MG tablet Take 325 mg by mouth daily.    . folic acid (FOLVITE) 559 MCG tablet Take 400 mcg by mouth daily.    . cetirizine (ZYRTEC) 10 MG tablet Take 1 tablet (10 mg total) by mouth daily. 30 tablet 11  . clindamycin (CLEOCIN) 150 MG capsule TK 4 CAPSULES PO 1 H PRIOR TO APPT  0  . cycloSPORINE (RESTASIS) 0.05 % ophthalmic emulsion Place 1 drop into both eyes daily.    . diphenhydrAMINE (BENADRYL) 25 MG tablet Take 25 mg by mouth. ONE AT BEDTIME IF NEEDED SLEEP    . fluticasone (FLONASE) 50 MCG/ACT nasal spray Place 2  sprays into both nostrils daily. 16 g 6  . furosemide (LASIX) 40 MG tablet Take 1 tablet (40 mg total) by mouth daily. 60 tablet 3  . Loperamide HCl (IMODIUM PO) Take by mouth daily.    . meloxicam (MOBIC) 15 MG tablet TAKE 1 TABLET BY MOUTH ONCE DAILY 90 tablet 0  . metoprolol succinate (TOPROL-XL) 50 MG 24 hr tablet TAKE 1 TABLET BY MOUTH EVERY DAY 90 tablet 0  . montelukast (SINGULAIR) 10 MG tablet Take 1 tablet (10 mg total) by mouth at bedtime. 30 tablet 3  .  NONFORMULARY OR COMPOUNDED ITEM Take 2 drops by mouth 2 (two) times daily.    . ondansetron (ZOFRAN) 4 MG tablet TAKE 1 TABLET(4 MG) BY MOUTH EVERY 8 HOURS AS NEEDED FOR NAUSEA OR VOMITING 20 tablet 0  . sildenafil (REVATIO) 20 MG tablet TAKE 2-5 TABLETS BY MOUTH 1 HOUR BEFORE AS NEEDED  11  . sildenafil (VIAGRA) 100 MG tablet TAKE 1 TABLET BY MOUTH ONCE DAILY AS NEEDED FOR ERECTILE DYSFUNCTION  3  . traMADol (ULTRAM) 50 MG tablet Take 1 tablet (50 mg total) by mouth every 8 (eight) hours as needed. 30 tablet 0   No current facility-administered medications for this visit.      REVIEW OF SYSTEMS:   Constitutional: Denies fevers, chills or night sweats Eyes: Denies blurriness of vision Ears, nose, mouth, throat, and face: Denies mucositis or sore throat Respiratory: Denies cough, dyspnea or wheezes Cardiovascular: Denies palpitation, chest discomfort or lower extremity swelling Gastrointestinal:  Denies nausea, heartburn or change in bowel habits Skin: Denies abnormal skin rashes Lymphatics: Denies new lymphadenopathy  Neurological:Denies numbness, tingling or new weaknesses Behavioral/Psych: Mood is stable, no new changes  All other systems were reviewed with the patient and are negative.  PHYSICAL EXAMINATION: ECOG PERFORMANCE STATUS: 0 - Asymptomatic  Vitals:   07/08/18 0842  BP: (!) 110/41  Pulse: 72  Resp: 18  Temp: 97.9 F (36.6 C)  SpO2: 100%   Filed Weights   07/08/18 0842  Weight: 263 lb 3.2 oz (119.4 kg)    GENERAL:alert, no distress and comfortable.  He looks mildly jaundiced NEURO: alert & oriented x 3 with fluent speech, no focal motor/sensory deficits  LABORATORY DATA:  I have reviewed the data as listed     Component Value Date/Time   NA 140 07/07/2018 0908   NA 136 07/04/2017 1532   K 4.2 07/07/2018 0908   K 4.3 07/04/2017 1532   CL 105 07/07/2018 0908   CO2 25 07/07/2018 0908   CO2 28 07/04/2017 1532   GLUCOSE 109 (H) 07/07/2018 0908   GLUCOSE  168 (H) 07/04/2017 1532   BUN 12 07/07/2018 0908   BUN 9.7 07/04/2017 1532   CREATININE 0.90 07/07/2018 0908   CREATININE 1.73 (H) 02/23/2018 1321   CREATININE 1.04 01/02/2018 1546   CREATININE 0.8 07/04/2017 1532   CALCIUM 9.0 07/07/2018 0908   CALCIUM 8.5 07/04/2017 1532   PROT 5.9 (L) 07/07/2018 0908   PROT 6.5 07/04/2017 1532   ALBUMIN 2.7 (L) 07/07/2018 0908   ALBUMIN 2.9 (L) 07/04/2017 1532   AST 50 (H) 07/07/2018 0908   AST 54 (H) 02/23/2018 1321   AST 100 (H) 07/04/2017 1532   ALT 18 07/07/2018 0908   ALT 19 02/23/2018 1321   ALT 26 07/04/2017 1532   ALKPHOS 100 07/07/2018 0908   ALKPHOS 125 07/04/2017 1532   BILITOT 4.2 (HH) 07/07/2018 0908   BILITOT 3.2 (H) 02/23/2018 1321   BILITOT  4.59 (HH) 07/04/2017 1532   GFRNONAA >60 07/07/2018 0908   GFRNONAA 39 (L) 02/23/2018 1321   GFRAA >60 07/07/2018 0908   GFRAA 45 (L) 02/23/2018 1321    No results found for: SPEP, UPEP  Lab Results  Component Value Date   WBC 3.2 (L) 07/08/2018   NEUTROABS 1.6 (L) 07/08/2018   HGB 8.6 (L) 07/08/2018   HCT 26.6 (L) 07/08/2018   MCV 108.6 (H) 07/08/2018   PLT 72 (L) 07/08/2018      Chemistry      Component Value Date/Time   NA 140 07/07/2018 0908   NA 136 07/04/2017 1532   K 4.2 07/07/2018 0908   K 4.3 07/04/2017 1532   CL 105 07/07/2018 0908   CO2 25 07/07/2018 0908   CO2 28 07/04/2017 1532   BUN 12 07/07/2018 0908   BUN 9.7 07/04/2017 1532   CREATININE 0.90 07/07/2018 0908   CREATININE 1.73 (H) 02/23/2018 1321   CREATININE 1.04 01/02/2018 1546   CREATININE 0.8 07/04/2017 1532      Component Value Date/Time   CALCIUM 9.0 07/07/2018 0908   CALCIUM 8.5 07/04/2017 1532   ALKPHOS 100 07/07/2018 0908   ALKPHOS 125 07/04/2017 1532   AST 50 (H) 07/07/2018 0908   AST 54 (H) 02/23/2018 1321   AST 100 (H) 07/04/2017 1532   ALT 18 07/07/2018 0908   ALT 19 02/23/2018 1321   ALT 26 07/04/2017 1532   BILITOT 4.2 (HH) 07/07/2018 0908   BILITOT 3.2 (H) 02/23/2018 1321    BILITOT 4.59 (HH) 07/04/2017 1532      I spent 15 minutes counseling the patient face to face. The total time spent in the appointment was 20 minutes and more than 50% was on counseling.   All questions were answered. The patient knows to call the clinic with any problems, questions or concerns. No barriers to learning was detected.    Heath Lark, MD 1/8/202010:07 AM

## 2018-07-08 NOTE — Assessment & Plan Note (Signed)
The patient had successful lithotripsy yesterday He continues to have minor bleeding but not excessive He does not need transfusion support today His recent blood work suggested mild iron deficiency anemia, likely due to chronic hematuria I recommend oral iron supplement daily I plan to see him again in a month for further follow-up If his iron studies show no improvement with oral iron supplement, I will consider intravenous iron infusion

## 2018-07-08 NOTE — Telephone Encounter (Signed)
Gave avs and calendar ° °

## 2018-07-09 ENCOUNTER — Telehealth: Payer: Self-pay

## 2018-07-09 LAB — AFP TUMOR MARKER: AFP, Serum, Tumor Marker: 6.3 ng/mL (ref 0.0–8.3)

## 2018-07-09 NOTE — Telephone Encounter (Signed)
Called and given below message. Wife verbalized understanding. 

## 2018-07-09 NOTE — Telephone Encounter (Signed)
-----   Message from Heath Lark, MD sent at 07/09/2018  8:22 AM EST ----- Regarding: AFP Let him know the tumor marker for liver cancer is negative ----- Message ----- From: Interface, Lab In [place] Sent: 07/08/2018   8:40 AM EST To: Heath Lark, MD

## 2018-07-10 DIAGNOSIS — R310 Gross hematuria: Secondary | ICD-10-CM | POA: Diagnosis not present

## 2018-07-10 DIAGNOSIS — N200 Calculus of kidney: Secondary | ICD-10-CM | POA: Diagnosis not present

## 2018-07-10 DIAGNOSIS — Z960 Presence of urogenital implants: Secondary | ICD-10-CM | POA: Diagnosis not present

## 2018-07-13 DIAGNOSIS — N200 Calculus of kidney: Secondary | ICD-10-CM | POA: Diagnosis not present

## 2018-07-13 DIAGNOSIS — R310 Gross hematuria: Secondary | ICD-10-CM | POA: Diagnosis not present

## 2018-07-13 DIAGNOSIS — Z960 Presence of urogenital implants: Secondary | ICD-10-CM | POA: Diagnosis not present

## 2018-07-13 DIAGNOSIS — N390 Urinary tract infection, site not specified: Secondary | ICD-10-CM | POA: Diagnosis not present

## 2018-07-13 DIAGNOSIS — R319 Hematuria, unspecified: Secondary | ICD-10-CM | POA: Diagnosis not present

## 2018-07-14 ENCOUNTER — Telehealth: Payer: Self-pay | Admitting: Hematology and Oncology

## 2018-07-14 NOTE — Telephone Encounter (Signed)
Scheduled appointments for 1/21, 1/22 and 1/23 per 1/14 schedule message. Per patient due to having kidney stones crushed on 1/23 he cannot come. Per patient 1/21 and 1/22 are not an issue but he has been trying to have this procedure for a while and just can't come 1/23.  Message to NG re above.

## 2018-07-14 NOTE — Telephone Encounter (Signed)
I received a telephone call from his urologist office about possible repeat lithotripsy again on January 23 We will get the patient return in 2 weeks for repeat blood work and transfusion as needed

## 2018-07-15 ENCOUNTER — Telehealth: Payer: Self-pay

## 2018-07-15 NOTE — Telephone Encounter (Signed)
I will explain to the patient when I see him on 1/21 why I wanted labs on 1/23 is to give him test results before his surgery

## 2018-07-15 NOTE — Telephone Encounter (Signed)
He called and left a message to call.  Called back. Given below message from Dr. Alvy Bimler regarding appts on 1/21, 1/22 and 1/23. He verbalized understanding.

## 2018-07-21 ENCOUNTER — Inpatient Hospital Stay (HOSPITAL_BASED_OUTPATIENT_CLINIC_OR_DEPARTMENT_OTHER): Payer: PPO | Admitting: Hematology and Oncology

## 2018-07-21 ENCOUNTER — Telehealth: Payer: Self-pay

## 2018-07-21 ENCOUNTER — Encounter: Payer: Self-pay | Admitting: Hematology and Oncology

## 2018-07-21 ENCOUNTER — Telehealth: Payer: Self-pay | Admitting: Hematology and Oncology

## 2018-07-21 ENCOUNTER — Inpatient Hospital Stay: Payer: PPO

## 2018-07-21 DIAGNOSIS — Z880 Allergy status to penicillin: Secondary | ICD-10-CM

## 2018-07-21 DIAGNOSIS — R58 Hemorrhage, not elsewhere classified: Secondary | ICD-10-CM

## 2018-07-21 DIAGNOSIS — K709 Alcoholic liver disease, unspecified: Secondary | ICD-10-CM

## 2018-07-21 DIAGNOSIS — Z791 Long term (current) use of non-steroidal anti-inflammatories (NSAID): Secondary | ICD-10-CM | POA: Diagnosis not present

## 2018-07-21 DIAGNOSIS — D689 Coagulation defect, unspecified: Secondary | ICD-10-CM

## 2018-07-21 DIAGNOSIS — Z79899 Other long term (current) drug therapy: Secondary | ICD-10-CM | POA: Diagnosis not present

## 2018-07-21 DIAGNOSIS — K625 Hemorrhage of anus and rectum: Secondary | ICD-10-CM

## 2018-07-21 DIAGNOSIS — D61818 Other pancytopenia: Secondary | ICD-10-CM | POA: Diagnosis not present

## 2018-07-21 DIAGNOSIS — N200 Calculus of kidney: Secondary | ICD-10-CM | POA: Diagnosis not present

## 2018-07-21 DIAGNOSIS — K7200 Acute and subacute hepatic failure without coma: Secondary | ICD-10-CM | POA: Diagnosis not present

## 2018-07-21 DIAGNOSIS — R161 Splenomegaly, not elsewhere classified: Secondary | ICD-10-CM

## 2018-07-21 DIAGNOSIS — K700 Alcoholic fatty liver: Secondary | ICD-10-CM

## 2018-07-21 DIAGNOSIS — D500 Iron deficiency anemia secondary to blood loss (chronic): Secondary | ICD-10-CM

## 2018-07-21 DIAGNOSIS — E538 Deficiency of other specified B group vitamins: Secondary | ICD-10-CM

## 2018-07-21 LAB — COMPREHENSIVE METABOLIC PANEL
ALBUMIN: 2.6 g/dL — AB (ref 3.5–5.0)
ALT: 14 U/L (ref 0–44)
AST: 49 U/L — ABNORMAL HIGH (ref 15–41)
Alkaline Phosphatase: 97 U/L (ref 38–126)
Anion gap: 6 (ref 5–15)
BUN: 11 mg/dL (ref 8–23)
CO2: 26 mmol/L (ref 22–32)
Calcium: 8.7 mg/dL — ABNORMAL LOW (ref 8.9–10.3)
Chloride: 106 mmol/L (ref 98–111)
Creatinine, Ser: 0.77 mg/dL (ref 0.61–1.24)
GFR calc Af Amer: >60 mL/min (ref >60)
GFR calc non Af Amer: >60 mL/min (ref >60)
Glucose, Bld: 107 mg/dL — ABNORMAL HIGH (ref 70–99)
Potassium: 4 mmol/L (ref 3.5–5.1)
Sodium: 138 mmol/L (ref 135–145)
Total Bilirubin: 4.3 mg/dL (ref 0.3–1.2)
Total Protein: 5.9 g/dL — ABNORMAL LOW (ref 6.5–8.1)

## 2018-07-21 LAB — CBC WITH DIFFERENTIAL/PLATELET
Abs Immature Granulocytes: 0.01 10*3/uL (ref 0.00–0.07)
Basophils Absolute: 0 10*3/uL (ref 0.0–0.1)
Basophils Relative: 1 %
Eosinophils Absolute: 0.2 10*3/uL (ref 0.0–0.5)
Eosinophils Relative: 7 %
HCT: 28.5 % — ABNORMAL LOW (ref 39.0–52.0)
Hemoglobin: 9.1 g/dL — ABNORMAL LOW (ref 13.0–17.0)
Immature Granulocytes: 0 %
Lymphocytes Relative: 32 %
Lymphs Abs: 0.9 10*3/uL (ref 0.7–4.0)
MCH: 34.5 pg — ABNORMAL HIGH (ref 26.0–34.0)
MCHC: 31.9 g/dL (ref 30.0–36.0)
MCV: 108 fL — AB (ref 80.0–100.0)
Monocytes Absolute: 0.3 10*3/uL (ref 0.1–1.0)
Monocytes Relative: 11 %
Neutro Abs: 1.4 10*3/uL — ABNORMAL LOW (ref 1.7–7.7)
Neutrophils Relative %: 49 %
Platelets: 65 10*3/uL — ABNORMAL LOW (ref 150–400)
RBC: 2.64 MIL/uL — ABNORMAL LOW (ref 4.22–5.81)
RDW: 13.8 % (ref 11.5–15.5)
WBC: 2.9 10*3/uL — ABNORMAL LOW (ref 4.0–10.5)
nRBC: 0 % (ref 0.0–0.2)

## 2018-07-21 LAB — FERRITIN: Ferritin: 89 ng/mL (ref 24–336)

## 2018-07-21 LAB — IRON AND TIBC
Iron: 208 ug/dL — ABNORMAL HIGH (ref 42–163)
Saturation Ratios: 93 % — ABNORMAL HIGH (ref 20–55)
TIBC: 223 ug/dL (ref 202–409)
UIBC: 15 ug/dL — ABNORMAL LOW (ref 117–376)

## 2018-07-21 NOTE — Assessment & Plan Note (Signed)
His CBC is stable/slightly improved since he has stopped drinking I plan to see him again in 6 weeks

## 2018-07-21 NOTE — Assessment & Plan Note (Signed)
He is scheduled for another surgery in 2 days There is no contraindication for his to proceed from my stand point and he does not need platelet transfusion

## 2018-07-21 NOTE — Assessment & Plan Note (Addendum)
I reviewed his other test results including his recent liver test along with coagulation study The patient had decompensated liver failure, currently at child Pugh score C The patient is motivated to quit drinking permanently We discussed importance of dietary change and weight loss as well to preserve his liver function I plan to recheck his liver function again when I see him back

## 2018-07-21 NOTE — Progress Notes (Signed)
[place] OFFICE PROGRESS NOTE  Timothy Minium, MD  ASSESSMENT & PLAN:  Other pancytopenia [facility]) His CBC is stable/slightly improved since he has stopped drinking I plan to see him again in 6 weeks  Alcoholic liver disease ([place]) I reviewed his other test results including his recent liver test along with coagulation study The patient had decompensated liver failure, currently at child Pugh score C The patient is motivated to quit drinking permanently We discussed importance of dietary change and weight loss as well to preserve his liver function I plan to recheck his liver function again when I see him back  Coagulopathy [facility]) He is not symptomatic Will recheck coagulation studies again in the future  Kidney stones He is scheduled for another surgery in 2 days There is no contraindication for his to proceed from my stand point and he does not need platelet transfusion   Orders Placed This Encounter  Procedures  . Protime-INR    Standing Status:   Standing    Number of Occurrences:   9    Standing Expiration Date:   07/22/2019    INTERVAL HISTORY: Timothy Mckinney 68 y.o. male returns for further follow-up and preop testing  He needs repeat kidney stone surgery in 2 days The patient denies any recent signs or symptoms of bleeding such as spontaneous epistaxis, hematuria or hematochezia. He has stopped drinking since our last meeting   SUMMARY OF HEMATOLOGIC HISTORY:  Timothy Mckinney 68 y.o. male was transferred to my care after his prior physician has left.  I reviewed the patient's records extensive and collaborated the history with the patient. Summary of his history is as follows: The patient was seen by another physician for evaluation of pancytopenia.   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   ALLERGIES:  is allergic to penicillins.  MEDICATIONS:  Current Outpatient Medications  Medication Sig Dispense Refill  . cetirizine (ZYRTEC) 10 MG tablet Take 1 tablet (10 mg total) by mouth daily. 30 tablet 11  . clindamycin (CLEOCIN) 150 MG capsule TK 4 CAPSULES PO 1 H PRIOR TO APPT  0  . cycloSPORINE (RESTASIS) 0.05 % ophthalmic emulsion Place 1 drop into both eyes daily.    . diphenhydrAMINE (BENADRYL) 25 MG tablet Take 25 mg by mouth. ONE AT BEDTIME IF NEEDED SLEEP    . ferrous sulfate 325 (65 FE) MG tablet Take 325 mg by mouth daily.    . fluticasone (FLONASE) 50 MCG/ACT nasal spray Place 2 sprays into both nostrils daily. 16 g 6  . folic acid (FOLVITE) 237 MCG tablet Take 400 mcg by mouth daily.    . furosemide (LASIX) 40 MG tablet Take 1 tablet (40 mg total) by mouth daily. 60 tablet 3  . Loperamide HCl (IMODIUM PO) Take by mouth daily.    . meloxicam (MOBIC) 15 MG tablet TAKE 1 TABLET BY MOUTH ONCE DAILY 90 tablet 0  . metoprolol succinate (TOPROL-XL) 50 MG 24 hr tablet TAKE 1 TABLET BY MOUTH EVERY DAY 90 tablet 0  . montelukast (  SINGULAIR) 10 MG tablet Take 1 tablet (10 mg total) by mouth at bedtime. 30 tablet 3  . NONFORMULARY OR COMPOUNDED ITEM Take 2 drops by mouth 2 (two) times daily.    . ondansetron (ZOFRAN) 4 MG tablet TAKE 1 TABLET(4 MG) BY MOUTH EVERY 8 HOURS AS NEEDED FOR NAUSEA OR VOMITING 20 tablet 0  . sildenafil (REVATIO) 20 MG tablet TAKE 2-5 TABLETS BY MOUTH 1 HOUR BEFORE AS NEEDED  11  . sildenafil (VIAGRA) 100 MG tablet TAKE 1 TABLET BY MOUTH ONCE DAILY AS NEEDED FOR ERECTILE DYSFUNCTION  3  . traMADol (ULTRAM) 50 MG tablet  Take 1 tablet (50 mg total) by mouth every 8 (eight) hours as needed. 30 tablet 0   No current facility-administered medications for this visit.      REVIEW OF SYSTEMS:   Constitutional: Denies fevers, chills or night sweats Eyes: Denies blurriness of vision Ears, nose, mouth, throat, and face: Denies mucositis or sore throat Respiratory: Denies cough, dyspnea or wheezes Cardiovascular: Denies palpitation, chest discomfort or lower extremity swelling Gastrointestinal:  Denies nausea, heartburn or change in bowel habits Skin: Denies abnormal skin rashes Lymphatics: Denies new lymphadenopathy or easy bruising Neurological:Denies numbness, tingling or new weaknesses Behavioral/Psych: Mood is stable, no new changes  All other systems were reviewed with the patient and are negative.  PHYSICAL EXAMINATION: ECOG PERFORMANCE STATUS: 0 - Asymptomatic  Vitals:   07/21/18 1154  BP: (!) 122/47  Pulse: 62  Resp: 19  Temp: 97.7 F (36.5 C)  SpO2: 100%   There were no vitals filed for this visit.  GENERAL:alert, no distress and comfortable  NEURO: alert & oriented x 3 with fluent speech, no focal motor/sensory deficits  LABORATORY DATA:  I have reviewed the data as listed     Component Value Date/Time   NA 138 07/21/2018 1136   NA 136 07/04/2017 1532   K 4.0 07/21/2018 1136   K 4.3 07/04/2017 1532   CL 106 07/21/2018 1136   CO2 26 07/21/2018 1136   CO2 28 07/04/2017 1532   GLUCOSE 107 (H) 07/21/2018 1136   GLUCOSE 168 (H) 07/04/2017 1532   BUN 11 07/21/2018 1136   BUN 9.7 07/04/2017 1532   CREATININE 0.77 07/21/2018 1136   CREATININE 1.73 (H) 02/23/2018 1321   CREATININE 1.04 01/02/2018 1546   CREATININE 0.8 07/04/2017 1532   CALCIUM 8.7 (L) 07/21/2018 1136   CALCIUM 8.5 07/04/2017 1532   PROT 5.9 (L) 07/21/2018 1136   PROT 6.5 07/04/2017 1532   ALBUMIN 2.6 (L) 07/21/2018 1136   ALBUMIN 2.9 (L) 07/04/2017 1532   AST 49 (H) 07/21/2018 1136   AST 54 (H) 02/23/2018 1321    AST 100 (H) 07/04/2017 1532   ALT 14 07/21/2018 1136   ALT 19 02/23/2018 1321   ALT 26 07/04/2017 1532   ALKPHOS 97 07/21/2018 1136   ALKPHOS 125 07/04/2017 1532   BILITOT 4.3 (HH) 07/21/2018 1136   BILITOT 3.2 (H) 02/23/2018 1321   BILITOT 4.59 (HH) 07/04/2017 1532   GFRNONAA >60 07/21/2018 1136   GFRNONAA 39 (L) 02/23/2018 1321   GFRAA >60 07/21/2018 1136   GFRAA 45 (L) 02/23/2018 1321    No results found for: SPEP, UPEP  Lab Results  Component Value Date   WBC 2.9 (L) 07/21/2018   NEUTROABS 1.4 (L) 07/21/2018   HGB 9.1 (L) 07/21/2018   HCT 28.5 (L) 07/21/2018   MCV 108.0 (H) 07/21/2018   PLT 65 (L) 07/21/2018      Chemistry  Component Value Date/Time   NA 138 07/21/2018 1136   NA 136 07/04/2017 1532   K 4.0 07/21/2018 1136   K 4.3 07/04/2017 1532   CL 106 07/21/2018 1136   CO2 26 07/21/2018 1136   CO2 28 07/04/2017 1532   BUN 11 07/21/2018 1136   BUN 9.7 07/04/2017 1532   CREATININE 0.77 07/21/2018 1136   CREATININE 1.73 (H) 02/23/2018 1321   CREATININE 1.04 01/02/2018 1546   CREATININE 0.8 07/04/2017 1532      Component Value Date/Time   CALCIUM 8.7 (L) 07/21/2018 1136   CALCIUM 8.5 07/04/2017 1532   ALKPHOS 97 07/21/2018 1136   ALKPHOS 125 07/04/2017 1532   AST 49 (H) 07/21/2018 1136   AST 54 (H) 02/23/2018 1321   AST 100 (H) 07/04/2017 1532   ALT 14 07/21/2018 1136   ALT 19 02/23/2018 1321   ALT 26 07/04/2017 1532   BILITOT 4.3 (HH) 07/21/2018 1136   BILITOT 3.2 (H) 02/23/2018 1321   BILITOT 4.59 (HH) 07/04/2017 1532      I spent 10 minutes counseling the patient face to face. The total time spent in the appointment was 15 minutes and more than 50% was on counseling.   All questions were answered. The patient knows to call the clinic with any problems, questions or concerns. No barriers to learning was detected.    Heath Lark, MD 1/21/20204:31 PM

## 2018-07-21 NOTE — Telephone Encounter (Signed)
No los °

## 2018-07-21 NOTE — Assessment & Plan Note (Signed)
He is not symptomatic Will recheck coagulation studies again in the future

## 2018-07-21 NOTE — Telephone Encounter (Signed)
Scheduled appt per 1/21 Sch message - pt is aware of appt date and time

## 2018-07-21 NOTE — Telephone Encounter (Signed)
He called and left a message to call him.  Called back. He talked with his urologist, he can his procedure as long as platelets are above 60. Per Dr. Alvy Bimler, given total bilirubin results of today. Appt canceled for platelet infusion 1/22 and labs on 1/23. Scheduling message sent for appt in 6 weeks for labs and Dr. Alvy Bimler.

## 2018-07-22 ENCOUNTER — Inpatient Hospital Stay: Payer: PPO

## 2018-07-23 ENCOUNTER — Inpatient Hospital Stay: Payer: PPO

## 2018-07-23 DIAGNOSIS — N200 Calculus of kidney: Secondary | ICD-10-CM | POA: Diagnosis not present

## 2018-08-04 DIAGNOSIS — N200 Calculus of kidney: Secondary | ICD-10-CM | POA: Diagnosis not present

## 2018-08-10 ENCOUNTER — Ambulatory Visit: Payer: PPO | Admitting: Hematology and Oncology

## 2018-08-10 ENCOUNTER — Other Ambulatory Visit: Payer: PPO

## 2018-09-01 ENCOUNTER — Encounter: Payer: Self-pay | Admitting: Hematology and Oncology

## 2018-09-01 ENCOUNTER — Inpatient Hospital Stay: Payer: PPO

## 2018-09-01 ENCOUNTER — Inpatient Hospital Stay: Payer: PPO | Attending: Hematology and Oncology | Admitting: Hematology and Oncology

## 2018-09-01 ENCOUNTER — Telehealth: Payer: Self-pay

## 2018-09-01 ENCOUNTER — Telehealth: Payer: Self-pay | Admitting: Hematology and Oncology

## 2018-09-01 DIAGNOSIS — D61818 Other pancytopenia: Secondary | ICD-10-CM | POA: Insufficient documentation

## 2018-09-01 DIAGNOSIS — D689 Coagulation defect, unspecified: Secondary | ICD-10-CM

## 2018-09-01 DIAGNOSIS — Z79899 Other long term (current) drug therapy: Secondary | ICD-10-CM | POA: Insufficient documentation

## 2018-09-01 DIAGNOSIS — K625 Hemorrhage of anus and rectum: Secondary | ICD-10-CM

## 2018-09-01 DIAGNOSIS — K709 Alcoholic liver disease, unspecified: Secondary | ICD-10-CM | POA: Insufficient documentation

## 2018-09-01 DIAGNOSIS — Z791 Long term (current) use of non-steroidal anti-inflammatories (NSAID): Secondary | ICD-10-CM | POA: Diagnosis not present

## 2018-09-01 LAB — IRON AND TIBC
Iron: 232 ug/dL — ABNORMAL HIGH (ref 42–163)
Saturation Ratios: 99 % — ABNORMAL HIGH (ref 20–55)
TIBC: 235 ug/dL (ref 202–409)
UIBC: 3 ug/dL — ABNORMAL LOW (ref 117–376)

## 2018-09-01 LAB — COMPREHENSIVE METABOLIC PANEL
ALBUMIN: 2.7 g/dL — AB (ref 3.5–5.0)
ALT: 12 U/L (ref 0–44)
ANION GAP: 7 (ref 5–15)
AST: 38 U/L (ref 15–41)
Alkaline Phosphatase: 105 U/L (ref 38–126)
BILIRUBIN TOTAL: 5 mg/dL — AB (ref 0.3–1.2)
BUN: 12 mg/dL (ref 8–23)
CO2: 27 mmol/L (ref 22–32)
Calcium: 8.4 mg/dL — ABNORMAL LOW (ref 8.9–10.3)
Chloride: 103 mmol/L (ref 98–111)
Creatinine, Ser: 0.73 mg/dL (ref 0.61–1.24)
GFR calc Af Amer: >60 mL/min (ref >60)
GFR calc non Af Amer: >60 mL/min (ref >60)
Glucose, Bld: 114 mg/dL — ABNORMAL HIGH (ref 70–99)
Potassium: 3.9 mmol/L (ref 3.5–5.1)
Sodium: 137 mmol/L (ref 135–145)
Total Protein: 5.9 g/dL — ABNORMAL LOW (ref 6.5–8.1)

## 2018-09-01 LAB — CBC WITH DIFFERENTIAL/PLATELET
Abs Immature Granulocytes: 0 10*3/uL (ref 0.00–0.07)
BASOS ABS: 0 10*3/uL (ref 0.0–0.1)
Basophils Relative: 0 %
Eosinophils Absolute: 0.2 10*3/uL (ref 0.0–0.5)
Eosinophils Relative: 6 %
HCT: 27.8 % — ABNORMAL LOW (ref 39.0–52.0)
Hemoglobin: 9 g/dL — ABNORMAL LOW (ref 13.0–17.0)
Immature Granulocytes: 0 %
Lymphocytes Relative: 32 %
Lymphs Abs: 0.8 10*3/uL (ref 0.7–4.0)
MCH: 34.6 pg — ABNORMAL HIGH (ref 26.0–34.0)
MCHC: 32.4 g/dL (ref 30.0–36.0)
MCV: 106.9 fL — ABNORMAL HIGH (ref 80.0–100.0)
Monocytes Absolute: 0.3 10*3/uL (ref 0.1–1.0)
Monocytes Relative: 11 %
NRBC: 0 % (ref 0.0–0.2)
Neutro Abs: 1.2 10*3/uL — ABNORMAL LOW (ref 1.7–7.7)
Neutrophils Relative %: 51 %
Platelets: 56 10*3/uL — ABNORMAL LOW (ref 150–400)
RBC: 2.6 MIL/uL — ABNORMAL LOW (ref 4.22–5.81)
RDW: 17.2 % — ABNORMAL HIGH (ref 11.5–15.5)
WBC: 2.4 10*3/uL — ABNORMAL LOW (ref 4.0–10.5)

## 2018-09-01 LAB — PROTIME-INR
INR: 1.6 — ABNORMAL HIGH (ref 0.8–1.2)
Prothrombin Time: 19.2 seconds — ABNORMAL HIGH (ref 11.4–15.2)

## 2018-09-01 LAB — FERRITIN: Ferritin: 90 ng/mL (ref 24–336)

## 2018-09-01 NOTE — Progress Notes (Signed)
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 likely due to recent alcohol intake There is nothing that I can do from my perspective to help him At this point in time, he does not need transfusion support If he needs surgery again in the future, I will see him back for perioperative transfusion management I will see him back in a year for further follow-up  Alcoholic liver disease ([place]) The patient told me he only drank 1 since last time I saw him According to his wife, he has drink more than once I spent some time educating the patient the importance of staying abstinence His liver function panel today is worse including worsening coagulation profile likely due to worsening liver disease Previously, we have discussed referral to see liver specialist He has a gastroenterologist but has issues seeing the specialist due to unpleasant interaction I recommend follow-up with primary care doctor for referral to see another liver specialist His calculated Child Pugh score B is unlikely going to improve given his alcoholism  Coagulopathy ([place]) He is coagulopathic with elevated INR and is at risk of bleeding due to thrombocytopenia as well He does not need transfusion support at this point   No orders of the defined types were placed in this encounter.   INTERVAL HISTORY: Timothy Mckinney 68 y.o. male returns for further follow-up for pancytopenia Since last time I saw him, he has stopped having hematuria He has appointment to see his urologist He continues to bruise easily According to the patient, he has only drank once but his wife who is also my patient told me that he had drank several times The patient denies any recent signs or symptoms of bleeding such as spontaneous epistaxis, hematuria or hematochezia. He denies recent infection, fever or chills He has not seen a  gastroenterologist for some time  SUMMARY OF HEMATOLOGIC HISTORY:  Timothy Mckinney 68 y.o. male was transferred to my care after his prior physician has left.  I reviewed the patient's records extensive and collaborated the history with the patient. Summary of his history is as follows: The patient was seen by another physician for evaluation of pancytopenia.   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  ALLERGIES:  is allergic to penicillins.  MEDICATIONS:  Current Outpatient Medications  Medication Sig Dispense Refill  . cetirizine (ZYRTEC) 10 MG tablet Take 1 tablet (10 mg total) by mouth daily. 30 tablet 11  . clindamycin (CLEOCIN) 150 MG capsule TK 4 CAPSULES PO 1 H PRIOR TO APPT  0  . cycloSPORINE (RESTASIS) 0.05 % ophthalmic emulsion Place 1 drop into both eyes daily.    . diphenhydrAMINE (BENADRYL) 25 MG tablet Take 25 mg by mouth. ONE AT BEDTIME IF NEEDED SLEEP    . ferrous sulfate 325 (65 FE) MG tablet Take 325 mg by mouth daily.    . fluticasone (FLONASE) 50 MCG/ACT nasal spray Place 2 sprays into both nostrils daily. 16 g 6  .  folic acid (FOLVITE) 952 MCG tablet Take 400 mcg by mouth daily.    . furosemide (LASIX) 40 MG tablet Take 1 tablet (40 mg total) by mouth daily. 60 tablet 3  . Loperamide HCl (IMODIUM PO) Take by mouth daily.    . meloxicam (MOBIC) 15 MG tablet TAKE 1 TABLET BY MOUTH ONCE DAILY 90 tablet 0  . metoprolol succinate (TOPROL-XL) 50 MG 24 hr tablet TAKE 1 TABLET BY MOUTH EVERY  DAY 90 tablet 0  . montelukast (SINGULAIR) 10 MG tablet Take 1 tablet (10 mg total) by mouth at bedtime. 30 tablet 3  . NONFORMULARY OR COMPOUNDED ITEM Take 2 drops by mouth 2 (two) times daily.    . ondansetron (ZOFRAN) 4 MG tablet TAKE 1 TABLET(4 MG) BY MOUTH EVERY 8 HOURS AS NEEDED FOR NAUSEA OR VOMITING 20 tablet 0  . sildenafil (REVATIO) 20 MG tablet TAKE 2-5 TABLETS BY MOUTH 1 HOUR BEFORE AS NEEDED  11  . sildenafil (VIAGRA) 100 MG tablet TAKE 1 TABLET BY MOUTH ONCE DAILY AS NEEDED FOR ERECTILE DYSFUNCTION  3  . traMADol (ULTRAM) 50 MG tablet Take 1 tablet (50 mg total) by mouth every 8 (eight) hours as needed. 30 tablet 0   No current facility-administered medications for this visit.      REVIEW OF SYSTEMS:   Constitutional: Denies fevers, chills or night sweats Eyes: Denies blurriness of vision Ears, nose, mouth, throat, and face: Denies mucositis or sore throat Respiratory: Denies cough, dyspnea or wheezes Cardiovascular: Denies palpitation, chest discomfort or lower extremity swelling Gastrointestinal:  Denies nausea, heartburn or change in bowel habits Skin: Denies abnormal skin rashes Lymphatics: Denies new lymphadenopathy Neurological:Denies numbness, tingling or new weaknesses Behavioral/Psych: Mood is stable, no new changes  All other systems were reviewed with the patient and are negative.  PHYSICAL EXAMINATION: ECOG PERFORMANCE STATUS: 1 - Symptomatic but completely ambulatory  Vitals:   09/01/18 1131  BP: (!) 134/55  Pulse: 64  Resp: 18  Temp: 97.8 F (36.6 C)  SpO2: 100%   Filed Weights   09/01/18 1131  Weight: 262 lb 9.6 oz (119.1 kg)    GENERAL:alert, no distress and comfortable SKIN:noted skin bruising Musculoskeletal:no cyanosis of digits and no clubbing  NEURO: alert & oriented x 3 with fluent speech, no focal motor/sensory deficits  LABORATORY DATA:  I have reviewed the data as listed     Component Value Date/Time   NA 137 09/01/2018 1101    NA 136 07/04/2017 1532   K 3.9 09/01/2018 1101   K 4.3 07/04/2017 1532   CL 103 09/01/2018 1101   CO2 27 09/01/2018 1101   CO2 28 07/04/2017 1532   GLUCOSE 114 (H) 09/01/2018 1101   GLUCOSE 168 (H) 07/04/2017 1532   BUN 12 09/01/2018 1101   BUN 9.7 07/04/2017 1532   CREATININE 0.73 09/01/2018 1101   CREATININE 1.73 (H) 02/23/2018 1321   CREATININE 1.04 01/02/2018 1546   CREATININE 0.8 07/04/2017 1532   CALCIUM 8.4 (L) 09/01/2018 1101   CALCIUM 8.5 07/04/2017 1532   PROT 5.9 (L) 09/01/2018 1101   PROT 6.5 07/04/2017 1532   ALBUMIN 2.7 (L) 09/01/2018 1101   ALBUMIN 2.9 (L) 07/04/2017 1532   AST 38 09/01/2018 1101   AST 54 (H) 02/23/2018 1321   AST 100 (H) 07/04/2017 1532   ALT 12 09/01/2018 1101   ALT 19 02/23/2018 1321   ALT 26 07/04/2017 1532   ALKPHOS 105 09/01/2018 1101   ALKPHOS 125 07/04/2017 1532   BILITOT  5.0 (HH) 09/01/2018 1101   BILITOT 3.2 (H) 02/23/2018 1321   BILITOT 4.59 (HH) 07/04/2017 1532   GFRNONAA >60 09/01/2018 1101   GFRNONAA 39 (L) 02/23/2018 1321   GFRAA >60 09/01/2018 1101   GFRAA 45 (L) 02/23/2018 1321    No results found for: SPEP, UPEP  Lab Results  Component Value Date   WBC 2.4 (L) 09/01/2018   NEUTROABS 1.2 (L) 09/01/2018   HGB 9.0 (L) 09/01/2018   HCT 27.8 (L) 09/01/2018   MCV 106.9 (H) 09/01/2018   PLT 56 (L) 09/01/2018      Chemistry      Component Value Date/Time   NA 137 09/01/2018 1101   NA 136 07/04/2017 1532   K 3.9 09/01/2018 1101   K 4.3 07/04/2017 1532   CL 103 09/01/2018 1101   CO2 27 09/01/2018 1101   CO2 28 07/04/2017 1532   BUN 12 09/01/2018 1101   BUN 9.7 07/04/2017 1532   CREATININE 0.73 09/01/2018 1101   CREATININE 1.73 (H) 02/23/2018 1321   CREATININE 1.04 01/02/2018 1546   CREATININE 0.8 07/04/2017 1532      Component Value Date/Time   CALCIUM 8.4 (L) 09/01/2018 1101   CALCIUM 8.5 07/04/2017 1532   ALKPHOS 105 09/01/2018 1101   ALKPHOS 125 07/04/2017 1532   AST 38 09/01/2018 1101   AST 54 (H)  02/23/2018 1321   AST 100 (H) 07/04/2017 1532   ALT 12 09/01/2018 1101   ALT 19 02/23/2018 1321   ALT 26 07/04/2017 1532   BILITOT 5.0 (HH) 09/01/2018 1101   BILITOT 3.2 (H) 02/23/2018 1321   BILITOT 4.59 (HH) 07/04/2017 1532      I spent 15 minutes counseling the patient face to face. The total time spent in the appointment was 20 minutes and more than 50% was on counseling.   All questions were answered. The patient knows to call the clinic with any problems, questions or concerns. No barriers to learning was detected.    Heath Lark, MD 3/3/20201:23 PM

## 2018-09-01 NOTE — Telephone Encounter (Signed)
Called and given below message. She verbalized understanding. 

## 2018-09-01 NOTE — Assessment & Plan Note (Signed)
He is coagulopathic with elevated INR and is at risk of bleeding due to thrombocytopenia as well He does not need transfusion support at this point 

## 2018-09-01 NOTE — Assessment & Plan Note (Signed)
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

## 2018-09-01 NOTE — Assessment & Plan Note (Signed)
The patient told me he only drank 1 since last time I saw him According to his wife, he has drink more than once I spent some time educating the patient the importance of staying abstinence His liver function panel today is worse including worsening coagulation profile likely due to worsening liver disease Previously, we have discussed referral to see liver specialist He has a gastroenterologist but has issues seeing the specialist due to unpleasant interaction I recommend follow-up with primary care doctor for referral to see another liver specialist His calculated Child Pugh score B is unlikely going to improve given his alcoholism

## 2018-09-01 NOTE — Telephone Encounter (Signed)
-----   Message from Heath Lark, MD sent at 09/01/2018  1:26 PM EST ----- Regarding: labs Tell him his liver panel is a bit worse. Iron studies are ok

## 2018-09-01 NOTE — Telephone Encounter (Signed)
Gave avs and calendar ° °

## 2018-09-02 DIAGNOSIS — N200 Calculus of kidney: Secondary | ICD-10-CM | POA: Diagnosis not present

## 2018-09-02 DIAGNOSIS — R310 Gross hematuria: Secondary | ICD-10-CM | POA: Diagnosis not present

## 2018-09-07 ENCOUNTER — Other Ambulatory Visit: Payer: Self-pay

## 2018-09-07 ENCOUNTER — Encounter: Payer: Self-pay | Admitting: Family Medicine

## 2018-09-07 ENCOUNTER — Ambulatory Visit (INDEPENDENT_AMBULATORY_CARE_PROVIDER_SITE_OTHER): Payer: PPO | Admitting: Family Medicine

## 2018-09-07 ENCOUNTER — Telehealth: Payer: Self-pay | Admitting: *Deleted

## 2018-09-07 VITALS — BP 122/68 | HR 66 | Temp 98.1°F | Resp 17 | Ht 71.0 in | Wt 264.4 lb

## 2018-09-07 DIAGNOSIS — M25511 Pain in right shoulder: Secondary | ICD-10-CM

## 2018-09-07 DIAGNOSIS — K709 Alcoholic liver disease, unspecified: Secondary | ICD-10-CM | POA: Diagnosis not present

## 2018-09-07 DIAGNOSIS — E785 Hyperlipidemia, unspecified: Secondary | ICD-10-CM | POA: Diagnosis not present

## 2018-09-07 DIAGNOSIS — I10 Essential (primary) hypertension: Secondary | ICD-10-CM

## 2018-09-07 LAB — LIPID PANEL
Cholesterol: 148 mg/dL (ref 0–200)
HDL: 37.9 mg/dL — ABNORMAL LOW (ref >39.00)
LDL Cholesterol: 87 mg/dL (ref 0–99)
NONHDL: 109.86
Total CHOL/HDL Ratio: 4
Triglycerides: 112 mg/dL (ref 0.0–149.0)
VLDL: 22.4 mg/dL (ref 0.0–40.0)

## 2018-09-07 LAB — TSH: TSH: 1.44 u[IU]/mL (ref 0.35–4.50)

## 2018-09-07 NOTE — Assessment & Plan Note (Signed)
Ongoing issue.  Stressed need for healthy diet and exercise as able.

## 2018-09-07 NOTE — Patient Instructions (Signed)
Schedule your complete physical for late June We'll notify you of your lab results and make any changes if needed We'll call you with your Liver and Ortho appts We'll call you to schedule your CT Continue to work on healthy diet and regular exercise- you can do it! Cut back on the drinking until you are alcohol free- you can do it! Call with any questions or concerns Hang in there!

## 2018-09-07 NOTE — Assessment & Plan Note (Signed)
Chronic problem.  Adequate control.  Asymptomatic w/ exception of LE edema which is likely liver related.

## 2018-09-07 NOTE — Telephone Encounter (Signed)
Copied from [place] [id number]. Topic: General - Inquiry >> Sep 07, 2018 11:24 AM Vernona Rieger wrote: Reason for CRM: patient states he was just in the office and he and Dr Birdie Riddle discussed his fluid build up in his legs but then started discussing other things. He would like to know does he need to start back on his lasix?

## 2018-09-07 NOTE — Assessment & Plan Note (Signed)
Ongoing issue.  Bilirubin is worse, coagulopathy and pancytopenia are also worsening.  Agree w/ Dr Alvy Bimler that hepatology is needed.  Referral placed.  Pt is now interested in scheduling CT that was ordered last summer.  Will facilitate that for him.  Stressed need to wean drinking and then stay alcohol free.

## 2018-09-07 NOTE — Telephone Encounter (Signed)
Yes- restart Lasix

## 2018-09-07 NOTE — Assessment & Plan Note (Signed)
Pt has hx of this.  Attempting to control w/ diet and exercise.  Check labs to determine if meds needed.

## 2018-09-07 NOTE — Telephone Encounter (Signed)
Spoke with patient to let him know to restart lasix per PCP. Stated verbal understanding and will call us if he has any issues.

## 2018-09-07 NOTE — Progress Notes (Signed)
   Subjective:    Patient ID: Timothy Mckinney, male    DOB: 1950/07/16, 68 y.o.   MRN: [id number]  HPI HTN- chronic problem, on Metoprolol 50mg  daily w/ good control.  Not taking Lasix.  No CP, SOB,    Hyperlipidemia- has been attempting to control w/ diet and exercise.  Weight is stable.  No abd pain.  Alcoholic liver disease- Saw Dr Alvy Bimler last week and was encouraged to see a liver specialist due to worsening coagulopathy, increasing bilirubin, pancytopenia.  Pt is now interested in scheduling CT abd/pelvis.  + LE edema.  No abd pain, nausea and vomiting have improved.  'i've had ups and downs on the drinking'.  R shoulder pain- 'I think I tore my rotator cuff'.  Unable to raise arm above shoulder height.  Has not seen Ortho yet.  Goes to [facility].  sxs started 4-6 weeks ago.   Review of Systems For ROS see HPI     Objective:   Physical Exam Vitals signs reviewed.  Constitutional:      General: He is not in acute distress.    Appearance: He is well-developed. He is obese.  HENT:     Head: Normocephalic and atraumatic.  Eyes:     Conjunctiva/sclera: Conjunctivae normal.     Pupils: Pupils are equal, round, and reactive to light.  Neck:     Musculoskeletal: Normal range of motion and neck supple.     Thyroid: No thyromegaly.  Cardiovascular:     Rate and Rhythm: Normal rate and regular rhythm.     Heart sounds: Normal heart sounds. No murmur.  Pulmonary:     Effort: Pulmonary effort is normal. No respiratory distress.     Breath sounds: Normal breath sounds.  Abdominal:     General: Bowel sounds are normal. There is no distension.     Palpations: Abdomen is soft.  Musculoskeletal:        General: Deformity (R anterior shoulder deformity, TTP) present.     Right lower leg: Edema (1+ pitting edema) present.     Left lower leg: Edema (1+ pitting edema) present.     Comments: Unable to raise R arm above shoulder height  Lymphadenopathy:     Cervical: No cervical  adenopathy.  Skin:    General: Skin is warm and dry.  Neurological:     Mental Status: He is alert and oriented to person, place, and time.     Cranial Nerves: No cranial nerve deficit.  Psychiatric:        Behavior: Behavior normal.           Assessment & Plan:  R shoulder pain- new.  Pt w/ obvious deformity of R shoulder and inability to raise above shoulder height.  Refer back to [facility].  Pt expressed understanding and is in agreement w/ plan.

## 2018-09-08 ENCOUNTER — Encounter: Payer: Self-pay | Admitting: General Practice

## 2018-09-08 ENCOUNTER — Other Ambulatory Visit: Payer: Self-pay | Admitting: Family Medicine

## 2018-09-08 DIAGNOSIS — I10 Essential (primary) hypertension: Secondary | ICD-10-CM

## 2018-09-10 DIAGNOSIS — M19011 Primary osteoarthritis, right shoulder: Secondary | ICD-10-CM | POA: Diagnosis not present

## 2018-09-10 DIAGNOSIS — M25511 Pain in right shoulder: Secondary | ICD-10-CM | POA: Diagnosis not present

## 2018-09-14 ENCOUNTER — Other Ambulatory Visit: Payer: PPO

## 2018-10-13 ENCOUNTER — Other Ambulatory Visit: Payer: Self-pay | Admitting: Family Medicine

## 2018-12-10 ENCOUNTER — Other Ambulatory Visit: Payer: Self-pay | Admitting: Family Medicine

## 2018-12-10 DIAGNOSIS — I10 Essential (primary) hypertension: Secondary | ICD-10-CM

## 2019-01-06 ENCOUNTER — Telehealth: Payer: Self-pay | Admitting: Family Medicine

## 2019-01-06 MED ORDER — FUROSEMIDE 40 MG PO TABS: 40.0000 mg | ORAL_TABLET | Freq: Two times a day (BID) | ORAL | 0 refills | Status: DC

## 2019-01-06 NOTE — Telephone Encounter (Signed)
Pt can increase his Lasix but he needs a visit to discuss causes of swelling and check labs.

## 2019-01-06 NOTE — Telephone Encounter (Signed)
Pt called in stating that he is retaining fluid, he states that the last time this happened Dr. Birdie Riddle told him to increase his lasix 40mg  to 2X a day. He wanted to know if he should do this again. Pt can be reached at the home # and he uses [facility] on [address].

## 2019-01-06 NOTE — Telephone Encounter (Signed)
Please advise 

## 2019-01-06 NOTE — Telephone Encounter (Signed)
Called and advised pt. In office appt made 01/20/19, new Rx sent to pharmacy.

## 2019-01-13 ENCOUNTER — Ambulatory Visit: Payer: PPO | Admitting: Family Medicine

## 2019-01-20 ENCOUNTER — Encounter: Payer: Self-pay | Admitting: Family Medicine

## 2019-01-20 ENCOUNTER — Other Ambulatory Visit: Payer: Self-pay

## 2019-01-20 ENCOUNTER — Ambulatory Visit (INDEPENDENT_AMBULATORY_CARE_PROVIDER_SITE_OTHER): Payer: PPO | Admitting: Family Medicine

## 2019-01-20 ENCOUNTER — Other Ambulatory Visit: Payer: Self-pay | Admitting: Family Medicine

## 2019-01-20 ENCOUNTER — Telehealth: Payer: Self-pay

## 2019-01-20 VITALS — BP 112/62 | HR 75 | Temp 97.5°F | Resp 16 | Ht 71.0 in | Wt 246.0 lb

## 2019-01-20 DIAGNOSIS — R600 Localized edema: Secondary | ICD-10-CM | POA: Diagnosis not present

## 2019-01-20 DIAGNOSIS — I10 Essential (primary) hypertension: Secondary | ICD-10-CM | POA: Diagnosis not present

## 2019-01-20 DIAGNOSIS — R609 Edema, unspecified: Secondary | ICD-10-CM | POA: Insufficient documentation

## 2019-01-20 DIAGNOSIS — R17 Unspecified jaundice: Secondary | ICD-10-CM

## 2019-01-20 DIAGNOSIS — K709 Alcoholic liver disease, unspecified: Secondary | ICD-10-CM

## 2019-01-20 LAB — CBC WITH DIFFERENTIAL/PLATELET
Basophils Absolute: 0 10*3/uL (ref 0.0–0.1)
Basophils Relative: 1 % (ref 0.0–3.0)
Eosinophils Absolute: 0.2 10*3/uL (ref 0.0–0.7)
Eosinophils Relative: 5.5 % — ABNORMAL HIGH (ref 0.0–5.0)
HCT: 28.2 % — ABNORMAL LOW (ref 39.0–52.0)
Hemoglobin: 9.8 g/dL — ABNORMAL LOW (ref 13.0–17.0)
Lymphocytes Relative: 24.4 % (ref 12.0–46.0)
Lymphs Abs: 0.7 10*3/uL (ref 0.7–4.0)
MCHC: 34.7 g/dL (ref 30.0–36.0)
MCV: 111.7 fl — ABNORMAL HIGH (ref 78.0–100.0)
Monocytes Absolute: 0.2 10*3/uL (ref 0.1–1.0)
Monocytes Relative: 7.1 % (ref 3.0–12.0)
Neutro Abs: 1.7 10*3/uL (ref 1.4–7.7)
Neutrophils Relative %: 62 % (ref 43.0–77.0)
Platelets: 64 10*3/uL — ABNORMAL LOW (ref 150.0–400.0)
RBC: 2.53 Mil/uL — ABNORMAL LOW (ref 4.22–5.81)
RDW: 17.6 % — ABNORMAL HIGH (ref 11.5–15.5)
WBC: 2.7 10*3/uL — ABNORMAL LOW (ref 4.0–10.5)

## 2019-01-20 LAB — HEPATIC FUNCTION PANEL
ALT: 15 U/L (ref 0–53)
AST: 49 U/L — ABNORMAL HIGH (ref 0–37)
Albumin: 2.9 g/dL — ABNORMAL LOW (ref 3.5–5.2)
Alkaline Phosphatase: 91 U/L (ref 39–117)
Bilirubin, Direct: 2.4 mg/dL — ABNORMAL HIGH (ref 0.0–0.3)
Total Bilirubin: 6.1 mg/dL — ABNORMAL HIGH (ref 0.2–1.2)
Total Protein: 5.2 g/dL — ABNORMAL LOW (ref 6.0–8.3)

## 2019-01-20 LAB — BASIC METABOLIC PANEL
BUN: 13 mg/dL (ref 6–23)
CO2: 35 mEq/L — ABNORMAL HIGH (ref 19–32)
Calcium: 8.6 mg/dL (ref 8.4–10.5)
Chloride: 95 mEq/L — ABNORMAL LOW (ref 96–112)
Creatinine, Ser: 1.1 mg/dL (ref 0.40–1.50)
GFR: 66.2 mL/min (ref >60.00)
Glucose, Bld: 180 mg/dL — ABNORMAL HIGH (ref 70–99)
Potassium: 2.8 mEq/L — CL (ref 3.5–5.1)
Sodium: 138 mEq/L (ref 135–145)

## 2019-01-20 LAB — TSH: TSH: 1.39 u[IU]/mL (ref 0.35–4.50)

## 2019-01-20 MED ORDER — POTASSIUM CHLORIDE CRYS ER 20 MEQ PO TBCR: 20.0000 meq | EXTENDED_RELEASE_TABLET | Freq: Every day | ORAL | 3 refills | Status: DC

## 2019-01-20 MED ORDER — ONDANSETRON HCL 4 MG PO TABS: ORAL_TABLET | ORAL | 1 refills | Status: DC

## 2019-01-20 MED ORDER — METOPROLOL SUCCINATE ER 25 MG PO TB24: 25.0000 mg | ORAL_TABLET | Freq: Every day | ORAL | 0 refills | Status: DC

## 2019-01-20 MED ORDER — FLUTICASONE PROPIONATE 50 MCG/ACT NA SUSP: 2.0000 | Freq: Every day | NASAL | 6 refills | Status: DC

## 2019-01-20 NOTE — Progress Notes (Signed)
   Subjective:    Patient ID: Timothy Mckinney, male    DOB: 1951-03-06, 68 y.o.   MRN: [id number]  HPI Edema- we increased pt's Lasix to BID due to increased swelling on 7/8.  Pt is down 18 lbs today.  Pt reports swelling has improved.  Swelling will increase as day goes on.  Dizziness- pt reports in the last 3-4 months he will have dizziness when standing and attempting to walk.  Also occurs w/ bending over.  No CP, SOB, HAs, visual changes.   Review of Systems For ROS see HPI     Objective:   Physical Exam Vitals signs reviewed.  Constitutional:      General: He is not in acute distress.    Appearance: Normal appearance. He is well-developed. He is obese.  HENT:     Head: Normocephalic and atraumatic.  Eyes:     Conjunctiva/sclera: Conjunctivae normal.     Pupils: Pupils are equal, round, and reactive to light.  Neck:     Musculoskeletal: Normal range of motion and neck supple.     Thyroid: No thyromegaly.  Cardiovascular:     Rate and Rhythm: Normal rate and regular rhythm.     Heart sounds: Normal heart sounds. No murmur.  Pulmonary:     Effort: Pulmonary effort is normal. No respiratory distress.     Breath sounds: Normal breath sounds.  Abdominal:     General: Bowel sounds are normal. There is no distension.     Palpations: Abdomen is soft.  Musculoskeletal:     Right lower leg: Edema (1+) present.     Left lower leg: Edema (1+) present.  Lymphadenopathy:     Cervical: No cervical adenopathy.  Skin:    General: Skin is warm and dry.  Neurological:     Mental Status: He is alert and oriented to person, place, and time.     Cranial Nerves: No cranial nerve deficit.  Psychiatric:        Behavior: Behavior normal.           Assessment & Plan:

## 2019-01-20 NOTE — Patient Instructions (Signed)
Follow up as scheduled We'll notify you of your lab results and make any changes if needed Continue to drink plenty of fluids- this may improve your leg aches Decrease the Metoprolol to 1/2 tab (25mg ) of what you have.  I sent in a new prescription for the 25mg  so it will be back to 1 tab daily Call with any questions or concerns Hang in there!

## 2019-01-20 NOTE — Telephone Encounter (Signed)
Critical potassium: 2.8

## 2019-01-20 NOTE — Telephone Encounter (Signed)
Noted.  Will address on lab results

## 2019-01-20 NOTE — Assessment & Plan Note (Signed)
Improved w/ increased dose of Lasix.  Stressed need for increased fluid intake, regular activity, compression socks when sitting.  Check labs.  Will follow.

## 2019-01-20 NOTE — Assessment & Plan Note (Signed)
Chronic problem.  BP is on the low side and pt complains of dizziness when standing or bending over.  Will decrease metoprolol.  Pt to continue to monitor BP at home so we can adjust meds prn.

## 2019-01-21 ENCOUNTER — Other Ambulatory Visit (INDEPENDENT_AMBULATORY_CARE_PROVIDER_SITE_OTHER): Payer: PPO

## 2019-01-21 DIAGNOSIS — D649 Anemia, unspecified: Secondary | ICD-10-CM

## 2019-01-21 DIAGNOSIS — R7309 Other abnormal glucose: Secondary | ICD-10-CM | POA: Diagnosis not present

## 2019-01-21 LAB — FOLATE: Folate: 7.3 ng/mL (ref >5.9)

## 2019-01-21 LAB — HEMOGLOBIN A1C: Hgb A1c MFr Bld: 3.8 % — ABNORMAL LOW (ref 4.6–6.5)

## 2019-01-21 LAB — VITAMIN B12: Vitamin B-12: 720 pg/mL (ref 211–911)

## 2019-01-28 ENCOUNTER — Other Ambulatory Visit: Payer: Self-pay | Admitting: Emergency Medicine

## 2019-01-28 ENCOUNTER — Other Ambulatory Visit: Payer: Self-pay

## 2019-01-28 ENCOUNTER — Other Ambulatory Visit (INDEPENDENT_AMBULATORY_CARE_PROVIDER_SITE_OTHER): Payer: PPO

## 2019-01-28 DIAGNOSIS — E876 Hypokalemia: Secondary | ICD-10-CM

## 2019-01-28 LAB — BASIC METABOLIC PANEL
BUN: 13 mg/dL (ref 6–23)
CO2: 30 mEq/L (ref 19–32)
Calcium: 8.8 mg/dL (ref 8.4–10.5)
Chloride: 98 mEq/L (ref 96–112)
Creatinine, Ser: 0.95 mg/dL (ref 0.40–1.50)
GFR: 78.39 mL/min (ref >60.00)
Glucose, Bld: 135 mg/dL — ABNORMAL HIGH (ref 70–99)
Potassium: 3.7 mEq/L (ref 3.5–5.1)
Sodium: 136 mEq/L (ref 135–145)

## 2019-03-04 ENCOUNTER — Ambulatory Visit (INDEPENDENT_AMBULATORY_CARE_PROVIDER_SITE_OTHER): Payer: PPO | Admitting: Family Medicine

## 2019-03-04 ENCOUNTER — Other Ambulatory Visit: Payer: Self-pay

## 2019-03-04 ENCOUNTER — Ambulatory Visit: Payer: PPO

## 2019-03-04 ENCOUNTER — Encounter: Payer: Self-pay | Admitting: Family Medicine

## 2019-03-04 VITALS — BP 114/68 | HR 78 | Temp 97.9°F | Resp 17 | Ht 71.0 in | Wt 237.0 lb

## 2019-03-04 DIAGNOSIS — E669 Obesity, unspecified: Secondary | ICD-10-CM

## 2019-03-04 DIAGNOSIS — Z23 Encounter for immunization: Secondary | ICD-10-CM | POA: Diagnosis not present

## 2019-03-04 DIAGNOSIS — I10 Essential (primary) hypertension: Secondary | ICD-10-CM

## 2019-03-04 DIAGNOSIS — K709 Alcoholic liver disease, unspecified: Secondary | ICD-10-CM

## 2019-03-04 DIAGNOSIS — Z0000 Encounter for general adult medical examination without abnormal findings: Secondary | ICD-10-CM

## 2019-03-04 DIAGNOSIS — Z125 Encounter for screening for malignant neoplasm of prostate: Secondary | ICD-10-CM | POA: Diagnosis not present

## 2019-03-04 LAB — CBC WITH DIFFERENTIAL/PLATELET
Basophils Absolute: 0 10*3/uL (ref 0.0–0.1)
Basophils Relative: 1.1 % (ref 0.0–3.0)
Eosinophils Absolute: 0.2 10*3/uL (ref 0.0–0.7)
Eosinophils Relative: 6.1 % — ABNORMAL HIGH (ref 0.0–5.0)
HCT: 31.9 % — ABNORMAL LOW (ref 39.0–52.0)
Hemoglobin: 11 g/dL — ABNORMAL LOW (ref 13.0–17.0)
Lymphocytes Relative: 27.7 % (ref 12.0–46.0)
Lymphs Abs: 0.8 10*3/uL (ref 0.7–4.0)
MCHC: 34.6 g/dL (ref 30.0–36.0)
MCV: 110.3 fl — ABNORMAL HIGH (ref 78.0–100.0)
Monocytes Absolute: 0.3 10*3/uL (ref 0.1–1.0)
Monocytes Relative: 9.5 % (ref 3.0–12.0)
Neutro Abs: 1.7 10*3/uL (ref 1.4–7.7)
Neutrophils Relative %: 55.6 % (ref 43.0–77.0)
Platelets: 55 10*3/uL — ABNORMAL LOW (ref 150.0–400.0)
RBC: 2.89 Mil/uL — ABNORMAL LOW (ref 4.22–5.81)
RDW: 16 % — ABNORMAL HIGH (ref 11.5–15.5)
WBC: 3 10*3/uL — ABNORMAL LOW (ref 4.0–10.5)

## 2019-03-04 LAB — HEPATIC FUNCTION PANEL
ALT: 12 U/L (ref 0–53)
AST: 39 U/L — ABNORMAL HIGH (ref 0–37)
Albumin: 3 g/dL — ABNORMAL LOW (ref 3.5–5.2)
Alkaline Phosphatase: 93 U/L (ref 39–117)
Bilirubin, Direct: 2.3 mg/dL — ABNORMAL HIGH (ref 0.0–0.3)
Total Bilirubin: 6.2 mg/dL — ABNORMAL HIGH (ref 0.2–1.2)
Total Protein: 5.7 g/dL — ABNORMAL LOW (ref 6.0–8.3)

## 2019-03-04 LAB — LIPID PANEL
Cholesterol: 177 mg/dL (ref 0–200)
HDL: 36 mg/dL — ABNORMAL LOW (ref >39.00)
LDL Cholesterol: 113 mg/dL — ABNORMAL HIGH (ref 0–99)
NonHDL: 140.83
Total CHOL/HDL Ratio: 5
Triglycerides: 138 mg/dL (ref 0.0–149.0)
VLDL: 27.6 mg/dL (ref 0.0–40.0)

## 2019-03-04 LAB — BASIC METABOLIC PANEL
BUN: 16 mg/dL (ref 6–23)
CO2: 33 mEq/L — ABNORMAL HIGH (ref 19–32)
Calcium: 9.1 mg/dL (ref 8.4–10.5)
Chloride: 98 mEq/L (ref 96–112)
Creatinine, Ser: 1.09 mg/dL (ref 0.40–1.50)
GFR: 66.87 mL/min (ref >60.00)
Glucose, Bld: 125 mg/dL — ABNORMAL HIGH (ref 70–99)
Potassium: 3.8 mEq/L (ref 3.5–5.1)
Sodium: 139 mEq/L (ref 135–145)

## 2019-03-04 LAB — PSA, MEDICARE: PSA: 0.15 ng/ml (ref 0.10–4.00)

## 2019-03-04 LAB — TSH: TSH: 1.78 u[IU]/mL (ref 0.35–4.50)

## 2019-03-04 MED ORDER — CELECOXIB 100 MG PO CAPS: 100.0000 mg | ORAL_CAPSULE | Freq: Two times a day (BID) | ORAL | 3 refills | Status: DC

## 2019-03-04 NOTE — Assessment & Plan Note (Signed)
Pt's PE unchanged from previous.  UTD on colonoscopy.  Flu shot given.  Check labs.  Anticipatory guidance provided.  

## 2019-03-04 NOTE — Assessment & Plan Note (Signed)
Pt is down another 9 lbs since last visit.  Stressed need for healthy diet and regular exercise.  Will continue to follow.

## 2019-03-04 NOTE — Assessment & Plan Note (Signed)
Chronic problem.  Well controlled.  Asymptomatic.  Check labs.  No expected med changes.

## 2019-03-04 NOTE — Assessment & Plan Note (Signed)
Chronic problem.  Pt never scheduled w/ liver clinic.  Check labs.  Will re-refer if needed

## 2019-03-04 NOTE — Progress Notes (Signed)
   Subjective:    Patient ID: Timothy Mckinney, male    DOB: [birth year], 68 y.o.   MRN: [id number]  HPI Here today for MWV and CPE.  Risk Factors: HTN- chronic problem, on Metoprolol 25mg  daily.  Well controlled. Obesity- pt is down 9 lbs since last visit (5 weeks ago) Alcoholic Liver Disease- pt has seen hematology due to pancytopenia and coagulopathy.  Has refused CT scan.  On beta blocker.  Physical Activity: no physical activity Fall Risk: 1 fall w/ injury Depression: denies current sxs Hearing: normal to conversational tones, decreased to whispered voice ADL's: independent Cognitive: normal linear thought process, memory and attention intact Home Safety: safe at home, lives w/ wife Height, Weight, BMI, Visual Acuity: see vitals, vision corrected to 20/20 w/ glasses Counseling: UTD on colonoscopy, immunizations (due for flu today), follows w/ urology Labs Ordered: See A&P Care Plan: See A&P    Review of Systems Patient reports no vision/hearing changes, anorexia, fever ,adenopathy, persistant/recurrent hoarseness, swallowing issues, chest pain, palpitations, edema, persistant/recurrent cough, hemoptysis, dyspnea (rest,exertional, paroxysmal nocturnal), gastrointestinal  bleeding (melena, rectal bleeding), abdominal pain, excessive heart burn, GU symptoms (dysuria, hematuria, voiding/incontinence issues) syncope, focal weakness, memory loss, numbness & tingling, skin/hair/nail changes, depression, anxiety, abnormal bruising/bleeding.  + knee pain, back pain     Objective:   Physical Exam General Appearance:    Alert, cooperative, no distress, appears stated age, obese  Head:    Normocephalic, without obvious abnormality, atraumatic  Eyes:    PERRL, conjunctiva/corneas clear, EOM's intact, fundi    benign, both eyes       Ears:    Normal TM's and external ear canals, both ears  Nose:   Deferred due to COVID  Throat:   Neck:   Supple, symmetrical, trachea midline, no adenopathy;        thyroid:  No enlargement/tenderness/nodules  Back:     Symmetric, no curvature, ROM normal, no CVA tenderness  Lungs:     Clear to auscultation bilaterally, respirations unlabored  Chest wall:    No tenderness or deformity  Heart:    Regular rate and rhythm, S1 and S2 normal, no murmur, rub   or gallop  Abdomen:     Soft, non-tender, bowel sounds active all four quadrants,    no masses, no organomegaly  Genitalia:    Deferred to urology  Rectal:    Extremities:   Extremities normal, atraumatic, no cyanosis or edema  Pulses:   2+ and symmetric all extremities  Skin:   Skin color, texture, turgor normal, no rashes or lesions  Lymph nodes:   Cervical, supraclavicular, and axillary nodes normal  Neurologic:   CNII-XII intact. Normal strength, sensation and reflexes      throughout          Assessment & Plan:

## 2019-03-04 NOTE — Patient Instructions (Addendum)
Follow up in 6 months to recheck BP We'll notify you of your lab results and make any changes if needed Continue to work on healthy diet and regular exercise- you can do it! Restart the Celebrex twice daily- take w/ food Call with any questions or concerns Stay Safe!!! Happy [place] Birthday!!

## 2019-03-05 ENCOUNTER — Other Ambulatory Visit (INDEPENDENT_AMBULATORY_CARE_PROVIDER_SITE_OTHER): Payer: PPO

## 2019-03-05 ENCOUNTER — Other Ambulatory Visit: Payer: Self-pay

## 2019-03-05 DIAGNOSIS — R739 Hyperglycemia, unspecified: Secondary | ICD-10-CM | POA: Diagnosis not present

## 2019-03-05 DIAGNOSIS — R17 Unspecified jaundice: Secondary | ICD-10-CM

## 2019-03-05 LAB — HEMOGLOBIN A1C: Hgb A1c MFr Bld: 3.7 % — ABNORMAL LOW (ref 4.6–6.5)

## 2019-03-09 ENCOUNTER — Telehealth: Payer: Self-pay

## 2019-03-09 NOTE — Telephone Encounter (Signed)
It is at low end of normal- so yes, would continue Kdur

## 2019-03-09 NOTE — Telephone Encounter (Signed)
Patient called in wanting to know if he should continue taking his K-DUR due to his last potassium level being within normal range. Please advise.

## 2019-03-10 NOTE — Telephone Encounter (Signed)
Patient is aware of orders per PCP

## 2019-03-10 NOTE — Telephone Encounter (Signed)
LMOVM for patient to return call to office.

## 2019-03-17 ENCOUNTER — Other Ambulatory Visit: Payer: Self-pay

## 2019-03-17 MED ORDER — CELECOXIB 100 MG PO CAPS: 100.0000 mg | ORAL_CAPSULE | Freq: Two times a day (BID) | ORAL | 3 refills | Status: DC

## 2019-03-17 NOTE — Telephone Encounter (Signed)
I pended medication. This was just filled on 03/04/19 ok to print a new rx?

## 2019-03-17 NOTE — Addendum Note (Signed)
Addended by: Davis Gourd on: 03/17/2019 01:14 PM   Modules accepted: Orders

## 2019-03-17 NOTE — Telephone Encounter (Signed)
Patient called in stating that he is currently paying $30 per month for his Celebrex. He has searched for cheaper prices and has discovered [facility]." Patient is heading to [place] at the end of next week and is wanting to pick up a paper prescription to take with him to have it filled at [facility] or [facility]. Please advise

## 2019-03-18 ENCOUNTER — Telehealth: Payer: Self-pay

## 2019-03-18 NOTE — Telephone Encounter (Signed)
Celebrex script sent to [facility]. Called and cancelled script. Will print Celebrex 90 day script and have patient pick up.

## 2019-04-12 ENCOUNTER — Other Ambulatory Visit: Payer: Self-pay | Admitting: General Practice

## 2019-04-12 MED ORDER — METOPROLOL SUCCINATE ER 25 MG PO TB24: 25.0000 mg | ORAL_TABLET | Freq: Every day | ORAL | 0 refills | Status: DC

## 2019-04-12 MED ORDER — ONDANSETRON HCL 4 MG PO TABS: ORAL_TABLET | ORAL | 1 refills | Status: DC

## 2019-05-08 ENCOUNTER — Other Ambulatory Visit: Payer: Self-pay | Admitting: General Practice

## 2019-05-08 MED ORDER — POTASSIUM CHLORIDE CRYS ER 20 MEQ PO TBCR: 20.0000 meq | EXTENDED_RELEASE_TABLET | Freq: Every day | ORAL | 3 refills | Status: DC

## 2019-05-10 DIAGNOSIS — H04123 Dry eye syndrome of bilateral lacrimal glands: Secondary | ICD-10-CM | POA: Diagnosis not present

## 2019-05-10 DIAGNOSIS — H25042 Posterior subcapsular polar age-related cataract, left eye: Secondary | ICD-10-CM | POA: Diagnosis not present

## 2019-05-10 DIAGNOSIS — H2513 Age-related nuclear cataract, bilateral: Secondary | ICD-10-CM | POA: Diagnosis not present

## 2019-05-24 ENCOUNTER — Telehealth: Payer: Self-pay | Admitting: Family Medicine

## 2019-05-24 DIAGNOSIS — H2513 Age-related nuclear cataract, bilateral: Secondary | ICD-10-CM | POA: Diagnosis not present

## 2019-05-24 DIAGNOSIS — H2512 Age-related nuclear cataract, left eye: Secondary | ICD-10-CM | POA: Diagnosis not present

## 2019-05-24 NOTE — Telephone Encounter (Signed)
Please advise 

## 2019-05-24 NOTE — Telephone Encounter (Signed)
Pt called in stating that he is having some swelling in the legs, he has stopped taking the Lasix and wanted to know if he should go back on it. Pt can be reached at the cell #

## 2019-05-24 NOTE — Telephone Encounter (Signed)
Patient notified of PCP recommendations and is agreement and expresses an understanding.   Ok for PEC to Discuss results / PCP recommendations / Schedule patient.   

## 2019-05-24 NOTE — Telephone Encounter (Signed)
Restart 1 tab daily

## 2019-06-08 DIAGNOSIS — H25812 Combined forms of age-related cataract, left eye: Secondary | ICD-10-CM | POA: Diagnosis not present

## 2019-06-08 DIAGNOSIS — H2512 Age-related nuclear cataract, left eye: Secondary | ICD-10-CM | POA: Diagnosis not present

## 2019-07-14 ENCOUNTER — Other Ambulatory Visit: Payer: Self-pay | Admitting: General Practice

## 2019-07-14 MED ORDER — METOPROLOL SUCCINATE ER 25 MG PO TB24: 25.0000 mg | ORAL_TABLET | Freq: Every day | ORAL | 0 refills | Status: DC

## 2019-07-27 ENCOUNTER — Telehealth: Payer: Self-pay | Admitting: Family Medicine

## 2019-07-27 NOTE — Telephone Encounter (Signed)
About 6-7 weeks ago, pt stopped taking his Lasix and Potassium. Not sure if this was cleared by Dr.Tabori, pt states he stopped taking as his leg/ankle swelling was improved.  Last 1-2 weeks his leg swelling has returned. His questions: Original RX for Lasix and Potassium was for twice a day. Should he restart or take 1 per day. Needs refills on both at [facility] on [address] Or should he be trying something else?  Also, needs refill on celebrex to [facility] on [address]. Although he would like Dr. Virgil Benedict opinion of replacing this medication with Turmeric as recommended by his brother in law. Or can he add turmeric to his regimen.

## 2019-07-27 NOTE — Telephone Encounter (Signed)
Please advise 

## 2019-07-28 MED ORDER — POTASSIUM CHLORIDE CRYS ER 20 MEQ PO TBCR: 20.0000 meq | EXTENDED_RELEASE_TABLET | Freq: Every day | ORAL | 6 refills | Status: DC

## 2019-07-28 MED ORDER — CELECOXIB 100 MG PO CAPS: 100.0000 mg | ORAL_CAPSULE | Freq: Two times a day (BID) | ORAL | 6 refills | Status: DC

## 2019-07-28 MED ORDER — FUROSEMIDE 40 MG PO TABS: 40.0000 mg | ORAL_TABLET | Freq: Two times a day (BID) | ORAL | 1 refills | Status: DC

## 2019-07-28 NOTE — Telephone Encounter (Signed)
Medication filled to pharmacy as requested.   

## 2019-07-28 NOTE — Telephone Encounter (Signed)
Pt should start his Lasix once daily.  We can send in a new prescription for 1 tab daily, #30, 1 refill.  He can also restart potassium daily.  If symptoms do not improve he needs a visit  Also, can refill Celebrex to pharmacy and he can add Turmeric.

## 2019-07-29 ENCOUNTER — Other Ambulatory Visit: Payer: Self-pay | Admitting: Family Medicine

## 2019-08-04 ENCOUNTER — Telehealth: Payer: Self-pay | Admitting: Hematology and Oncology

## 2019-08-04 NOTE — Telephone Encounter (Signed)
I talk with patient regarding reschedule °

## 2019-09-02 ENCOUNTER — Ambulatory Visit: Payer: PPO | Admitting: Hematology and Oncology

## 2019-09-02 ENCOUNTER — Other Ambulatory Visit: Payer: PPO

## 2019-09-06 ENCOUNTER — Telehealth: Payer: Self-pay | Admitting: Family Medicine

## 2019-09-06 MED ORDER — METOPROLOL SUCCINATE ER 25 MG PO TB24: 25.0000 mg | ORAL_TABLET | Freq: Every day | ORAL | 0 refills | Status: DC

## 2019-09-06 NOTE — Telephone Encounter (Signed)
Medication filled to pharmacy as requested.   

## 2019-09-06 NOTE — Telephone Encounter (Signed)
Pt called in asking for a refill on the Metoprolol succinate. Pt uses [facility] on [address]. Please advise

## 2019-10-05 ENCOUNTER — Telehealth: Payer: Self-pay | Admitting: Family Medicine

## 2019-10-05 NOTE — Progress Notes (Signed)
  Chronic Care Management   Outreach Note  10/05/2019 Name: Timothy Mckinney MRN: [id number] DOB: 07/16/1950  Referred by: [name], MD Reason for referral : No chief complaint on file.   An unsuccessful telephone outreach was attempted today. The patient was referred to the pharmacist for assistance with care management and care coordination.   Follow Up Plan:   Earney [place] Upstream Scheduler

## 2019-10-06 ENCOUNTER — Telehealth: Payer: Self-pay | Admitting: Family Medicine

## 2019-10-06 NOTE — Progress Notes (Signed)
  Chronic Care Management   Note  10/06/2019 Name: Timothy Mckinney MRN: [id number] DOB: July 10, 1950  Timothy Mckinney is a 69 y.o. year old male who is a primary care patient of Timothy Mckinney, Timothy Millet, Timothy Mckinney. I reached out to Timothy Mckinney by phone today in response to a referral sent by Timothy Mckinney, Timothy Minium, Timothy Mckinney.   Timothy Mckinney was given information about Chronic Care Management services today including:  1. CCM service includes personalized support from designated clinical staff supervised by his physician, including individualized plan of care and coordination with other care providers 2. 24/7 contact phone numbers for assistance for urgent and routine care needs. 3. Service will only be billed when office clinical staff spend 20 minutes or more in a month to coordinate care. 4. Only one practitioner may furnish and bill the service in a calendar month. 5. The patient may stop CCM services at any time (effective at the end of the month) by phone call to the office staff.   Patient agreed to services and verbal consent obtained.   Follow up plan:   Timothy Mckinney

## 2019-10-11 ENCOUNTER — Other Ambulatory Visit: Payer: Self-pay | Admitting: General Practice

## 2019-10-11 MED ORDER — FUROSEMIDE 40 MG PO TABS: 40.0000 mg | ORAL_TABLET | Freq: Two times a day (BID) | ORAL | 1 refills | Status: DC

## 2019-10-15 ENCOUNTER — Other Ambulatory Visit: Payer: Self-pay | Admitting: General Practice

## 2019-10-15 DIAGNOSIS — I10 Essential (primary) hypertension: Secondary | ICD-10-CM

## 2019-10-15 DIAGNOSIS — E669 Obesity, unspecified: Secondary | ICD-10-CM

## 2019-10-15 DIAGNOSIS — E785 Hyperlipidemia, unspecified: Secondary | ICD-10-CM

## 2019-10-20 ENCOUNTER — Ambulatory Visit: Payer: PPO

## 2019-10-20 DIAGNOSIS — H25041 Posterior subcapsular polar age-related cataract, right eye: Secondary | ICD-10-CM | POA: Diagnosis not present

## 2019-10-20 DIAGNOSIS — I10 Essential (primary) hypertension: Secondary | ICD-10-CM

## 2019-10-20 DIAGNOSIS — H2511 Age-related nuclear cataract, right eye: Secondary | ICD-10-CM | POA: Diagnosis not present

## 2019-10-20 DIAGNOSIS — E669 Obesity, unspecified: Secondary | ICD-10-CM

## 2019-10-20 DIAGNOSIS — G8929 Other chronic pain: Secondary | ICD-10-CM

## 2019-10-20 DIAGNOSIS — K2100 Gastro-esophageal reflux disease with esophagitis, without bleeding: Secondary | ICD-10-CM

## 2019-10-20 DIAGNOSIS — H04123 Dry eye syndrome of bilateral lacrimal glands: Secondary | ICD-10-CM | POA: Diagnosis not present

## 2019-10-20 DIAGNOSIS — E785 Hyperlipidemia, unspecified: Secondary | ICD-10-CM

## 2019-10-20 NOTE — Progress Notes (Signed)
Chronic Care Management Pharmacy  Name: Timothy Mckinney  MRN: [id number] DOB: [birth year]  Chief Complaint/ HPI Timothy Mckinney,  69 y.o. , male presents for their Initial CCM visit with the clinical pharmacist via telephone due to COVID-19 Pandemic.  PCP : Timothy Minium, MD  Their chronic conditions include: HTN, obesity, hyperlipidemia, GERD, chronic pain.  Office Visits:  1/27 (PCP): Start Lasix 40 mg once daily, restart potassium, add turmeric.   9/3 (PCP): Added Celebrex 100 mg twice daily; needs 6 month f/u.    Outpatient Encounter Medications as of 10/20/2019  Medication Sig  . celecoxib (CELEBREX) 100 MG capsule TAKE ONE CAPSULE BY MOUTH TWICE A DAY  . cycloSPORINE (RESTASIS) 0.05 % ophthalmic emulsion Place 1 drop into both eyes daily.  . diphenhydrAMINE (BENADRYL) 25 MG tablet Take 25 mg by mouth. ONE AT BEDTIME IF NEEDED SLEEP  . ferrous sulfate 325 (65 FE) MG tablet Take 325 mg by mouth daily.  . fluticasone (FLONASE) 50 MCG/ACT nasal spray Place 2 sprays into both nostrils daily.  . folic acid (FOLVITE) A999333 MCG tablet Take 400 mcg by mouth daily.  . furosemide (LASIX) 40 MG tablet Take 1 tablet (40 mg total) by mouth 2 (two) times daily.  . metoprolol succinate (TOPROL-XL) 25 MG 24 hr tablet Take 1 tablet (25 mg total) by mouth daily.  . ondansetron (ZOFRAN) 4 MG tablet TAKE 1 TABLET(4 MG) BY MOUTH EVERY 8 HOURS AS NEEDED FOR NAUSEA OR VOMITING  . potassium chloride SA (KLOR-CON) 20 MEQ tablet Take 1 tablet (20 mEq total) by mouth daily.  . sildenafil (VIAGRA) 100 MG tablet TAKE 1 TABLET BY MOUTH ONCE DAILY AS NEEDED FOR ERECTILE DYSFUNCTION  . TURMERIC PO Take by mouth daily.  . Vitamin D-Vitamin K (K2 PLUS D3) [phone] MCG-UNIT TABS Take 1 tablet by mouth daily.  . cetirizine (ZYRTEC) 10 MG tablet Take 1 tablet (10 mg total) by mouth daily. (Patient not taking: Reported on 09/07/2018)  . montelukast (SINGULAIR) 10 MG tablet Take 1 tablet (10 mg total) by mouth at  bedtime. (Patient not taking: Reported on 03/04/2019)   No facility-administered encounter medications on file as of 10/20/2019.   Current Diagnosis/Assessment: Goals Addressed            This Visit's Progress   . PharmD Care Plan       CARE PLAN ENTRY  Current Barriers:  . Chronic Disease Management support, education, and care coordination needs related to high blood pressure, high cholesterol, acid reflux, obesity, pain.  Pharmacist Clinical Goal(s):  Over next three months: . LDL Cholesterol <100, BP <130/90, weight loss maintenance, minimize symptoms of pain and acid reflux.   Interventions: . Comprehensive medication review performed.  Patient Self Care Activities:  . Patient verbalizes understanding of plan to monitor blood pressure at least once weekly over the next three months. . Try restarting the Voltaren gel as discussed - please call with any questions! . Consider receiving the Tdap and Shingrix vaccines. The Shingrix vaccine is 2 doses, with the 2nd being 2-6 months after the 1st dose.   Initial goal documentation      Hypertension   BP Readings from Last 3 Encounters:  03/04/19 114/68  01/20/19 112/62  09/07/18 122/68   Patient is currently controlled on the following medications: metoprolol succinate 25 mg once daily, furosemide 40 mg once daily as needed, potassium 20 meq daily. Denies dizziness, SOB, chest pain.  Asks about potassium as it relates to current regimen.  We discussed potassium lowering effects of furosemide and patient expresses good understanding.    Patient checks BP at home infrequently. Wonders if BP medication dosing could be further reduced. Encourage routine home monitoring at least once weekly to help [place] understand how BP is trending. BP agreed to monitoring plan.   Plan Continue current medications.    Hyperlipidemia, Obesity   Lipid Panel     Component Value Date/Time   CHOL 177 03/04/2019 1038   TRIG 138.0 03/04/2019 1038    HDL 36.00 (L) 03/04/2019 1038   CHOLHDL 5 03/04/2019 1038   VLDL 27.6 03/04/2019 1038   LDLCALC 113 (H) 03/04/2019 1038    The 10-year ASCVD risk score Mikey Bussing DC Jr., et al., 2013) is: 33.1%   Values used to calculate the score:     Age: 36 years     Sex: Male     Is Non-Hispanic African American: No     Diabetic: Yes     Tobacco smoker: No     Systolic Blood Pressure: 99991111 mmHg     Is BP treated: Yes     HDL Cholesterol: 36 mg/dL     Total Cholesterol: 177 mg/dL   Patient is currently controlled on the following medications: n/a. Over last yr has managed to lose 70+ pounds through exercise/diet. Has phased out sodas, fries, most meat, and started consuming alternatives such as fruit and protein shakes. LDL borderline high but will continue to be monitored.  Plan Continue control with diet and exercise.   GERD  Patient denies GERD associated symptoms including dysphagia, heartburn or nausea over the past several years. Currently is not taking any medication for acid suppression. ~3 yrs ago had stopped using lemon supplement in water for prevention of kidney stones.  Plan  Continue current medication.   Vaccines  Reviewed and discussed patient's vaccination history. Due for Shingrix, Tdap. Had previously held off due to concerns of side effects. Is open to further considering these now. We agreed to discuss at follow-up visit.  Immunization History  Administered Date(s) Administered  . Fluad Quad(high Dose 65+) 03/04/2019  . Influenza,inj,Quad PF,6+ Mos 03/03/2018  . Pneumococcal Conjugate-13 08/05/2014  . Pneumococcal Polysaccharide-23 08/09/2015  . Tetanus 07/26/2008   Plan Recommended patient receive Shingrix, Tdap vaccine in office/pharmacy.   Pain  Reports chronic knee/back pain. Currently managed with Celebrex 100 mg twice daily, turmeric daily. Is considering physical therapy as he previously had success with this. Had previously used Voltaren gel but has not used  recently due to concern with it interfering with Celebrex and turmeric. Counseled patient on appropriate use of topical Voltaren gel, is willing to try this again.  Plan Continue current medications, restart Voltaren gel.  Medication Management  Pt uses [facility] for all medications. Does not report having any issues remembering to take medications or wanting support through delivery, med synch. etc.  Plan Continue current medication management strategy. Follow up: 3 month phone visit ______________ Visit Information SDOH (Social Determinants of Health) assessments performed: Yes.   Food Insecurity: No Food Insecurity  . Worried About Charity fundraiser in the Last Year: Never true  . Ran Out of Food in the Last Year: Never true     Transportation Needs: No Transportation Needs  . Lack of Transportation (Medical): No  . Lack of Transportation (Non-Medical): No   Mr. Beougher was given information about Chronic Care Management services today including:  1. CCM service includes personalized support from designated clinical staff supervised  by his physician, including individualized plan of care and coordination with other care providers 2. 24/7 contact phone numbers for assistance for urgent and routine care needs. 3. Standard insurance, coinsurance, copays and deductibles apply for chronic care management only during months in which we provide at least 20 minutes of these services. Most insurances cover these services at 100%, however patients may be responsible for any copay, coinsurance and/or deductible if applicable. This service may help you avoid the need for more expensive face-to-face services. 4. Only one practitioner may furnish and bill the service in a calendar month. 5. The patient may stop CCM services at any time (effective at the end of the month) by phone call to the office staff.  Patient agreed to services and verbal consent obtained.   Madelin Rear, Pharm.D.  Clinical Pharmacist [facility] [phone]

## 2019-10-20 NOTE — Patient Instructions (Addendum)
Visit Information  Goals Addressed            This Visit's Progress   . PharmD Care Plan       CARE PLAN ENTRY  Current Barriers:  . Chronic Disease Management support, education, and care coordination needs related to high blood pressure, high cholesterol, acid reflux, obesity, pain.  Pharmacist Clinical Goal(s):  Over next three months: . LDL Cholesterol <100, BP <130/90, weight loss maintenance, minimize symptoms of pain and acid reflux.   Interventions: . Comprehensive medication review performed.  Patient Self Care Activities:  . Patient verbalizes understanding of plan to monitor blood pressure at least once weekly over the next three months. . Try restarting the Voltaren gel as discussed - please call with any questions! . Consider receiving the Tdap and Shingrix vaccines. The Shingrix vaccine is 2 doses, with the 2nd being 2-6 months after the 1st dose.   Initial goal documentation      Timothy Mckinney was given information about Chronic Care Management services today including:  1. CCM service includes personalized support from designated clinical staff supervised by his physician, including individualized plan of care and coordination with other care providers 2. 24/7 contact phone numbers for assistance for urgent and routine care needs. 3. Standard insurance, coinsurance, copays and deductibles apply for chronic care management only during months in which we provide at least 20 minutes of these services. Most insurances cover these services at 100%, however patients may be responsible for any copay, coinsurance and/or deductible if applicable. This service may help you avoid the need for more expensive face-to-face services. 4. Only one practitioner may furnish and bill the service in a calendar month. 5. The patient may stop CCM services at any time (effective at the end of the month) by phone call to the office staff.  Patient agreed to services and verbal consent  obtained.   The patient verbalized understanding of instructions provided today and agreed to receive a mailed copy of patient instruction and/or educational materials. Telephone follow up appointment with pharmacy team member scheduled for: See next appointment with "Care Management Staff" under "What's Next" below.   Thank you!  Madelin Rear, Pharm.D. Clinical Pharmacist [facility] [phone]  Hypertension, Adult High blood pressure (hypertension) is when the force of blood pumping through the arteries is too strong. The arteries are the blood vessels that carry blood from the heart throughout the body. Hypertension forces the heart to work harder to pump blood and may cause arteries to become narrow or stiff. Untreated or uncontrolled hypertension can cause a heart attack, heart failure, a stroke, kidney disease, and other problems. A blood pressure reading consists of a higher number over a lower number. Ideally, your blood pressure should be below 120/80. The first ("top") number is called the systolic pressure. It is a measure of the pressure in your arteries as your heart beats. The second ("bottom") number is called the diastolic pressure. It is a measure of the pressure in your arteries as the heart relaxes. What are the causes? The exact cause of this condition is not known. There are some conditions that result in or are related to high blood pressure. What increases the risk? Some risk factors for high blood pressure are under your control. The following factors may make you more likely to develop this condition:  Smoking.  Having type 2 diabetes mellitus, high cholesterol, or both.  Not getting enough exercise or physical activity.  Being  overweight.  Having too much fat, sugar, calories, or salt (sodium) in your diet.  Drinking too much alcohol. Some risk factors for high blood pressure may be difficult or impossible to change. Some of  these factors include:  Having chronic kidney disease.  Having a family history of high blood pressure.  Age. Risk increases with age.  Race. You may be at higher risk if you are African American.  Gender. Men are at higher risk than women before age 60. After age 75, women are at higher risk than men.  Having obstructive sleep apnea.  Stress. What are the signs or symptoms? High blood pressure may not cause symptoms. Very high blood pressure (hypertensive crisis) may cause:  Headache.  Anxiety.  Shortness of breath.  Nosebleed.  Nausea and vomiting.  Vision changes.  Severe chest pain.  Seizures. How is this diagnosed? This condition is diagnosed by measuring your blood pressure while you are seated, with your arm resting on a flat surface, your legs uncrossed, and your feet flat on the floor. The cuff of the blood pressure monitor will be placed directly against the skin of your upper arm at the level of your heart. It should be measured at least twice using the same arm. Certain conditions can cause a difference in blood pressure between your right and left arms. Certain factors can cause blood pressure readings to be lower or higher than normal for a short period of time:  When your blood pressure is higher when you are in a health care provider's office than when you are at home, this is called white coat hypertension. Most people with this condition do not need medicines.  When your blood pressure is higher at home than when you are in a health care provider's office, this is called masked hypertension. Most people with this condition may need medicines to control blood pressure. If you have a high blood pressure reading during one visit or you have normal blood pressure with other risk factors, you may be asked to:  Return on a different day to have your blood pressure checked again.  Monitor your blood pressure at home for 1 week or longer. If you are diagnosed  with hypertension, you may have other blood or imaging tests to help your health care provider understand your overall risk for other conditions. How is this treated? This condition is treated by making healthy lifestyle changes, such as eating healthy foods, exercising more, and reducing your alcohol intake. Your health care provider may prescribe medicine if lifestyle changes are not enough to get your blood pressure under control, and if:  Your systolic blood pressure is above 130.  Your diastolic blood pressure is above 80. Your personal target blood pressure may vary depending on your medical conditions, your age, and other factors. Follow these instructions at home: Eating and drinking   Eat a diet that is high in fiber and potassium, and low in sodium, added sugar, and fat. An example eating plan is called the DASH (Dietary Approaches to Stop Hypertension) diet. To eat this way: ? Eat plenty of fresh fruits and vegetables. Try to fill one half of your plate at each meal with fruits and vegetables. ? Eat whole grains, such as whole-wheat pasta, brown rice, or whole-grain bread. Fill about one fourth of your plate with whole grains. ? Eat or drink low-fat dairy products, such as skim milk or low-fat yogurt. ? Avoid fatty cuts of meat, processed or cured meats, and poultry  with skin. Fill about one fourth of your plate with lean proteins, such as fish, chicken without skin, beans, eggs, or tofu. ? Avoid pre-made and processed foods. These tend to be higher in sodium, added sugar, and fat.  Reduce your daily sodium intake. Most people with hypertension should eat less than 1,500 mg of sodium a day.  Do not drink alcohol if: ? Your health care provider tells you not to drink. ? You are pregnant, may be pregnant, or are planning to become pregnant.  If you drink alcohol: ? Limit how much you use to:  0-1 drink a day for women.  0-2 drinks a day for men. ? Be aware of how much alcohol  is in your drink. In the [place] one drink equals one 12 oz bottle of beer (355 mL), one 5 oz glass of wine (148 mL), or one 1 oz glass of hard liquor (44 mL). Lifestyle   Work with your health care provider to maintain a healthy body weight or to lose weight. Ask what an ideal weight is for you.  Get at least 30 minutes of exercise most days of the week. Activities may include walking, swimming, or biking.  Include exercise to strengthen your muscles (resistance exercise), such as Pilates or lifting weights, as part of your weekly exercise routine. Try to do these types of exercises for 30 minutes at least 3 days a week.  Do not use any products that contain nicotine or tobacco, such as cigarettes, e-cigarettes, and chewing tobacco. If you need help quitting, ask your health care provider.  Monitor your blood pressure at home as told by your health care provider.  Keep all follow-up visits as told by your health care provider. This is important. Medicines  Take over-the-counter and prescription medicines only as told by your health care provider. Follow directions carefully. Blood pressure medicines must be taken as prescribed.  Do not skip doses of blood pressure medicine. Doing this puts you at risk for problems and can make the medicine less effective.  Ask your health care provider about side effects or reactions to medicines that you should watch for. Contact a health care provider if you:  Think you are having a reaction to a medicine you are taking.  Have headaches that keep coming back (recurring).  Feel dizzy.  Have swelling in your ankles.  Have trouble with your vision. Get help right away if you:  Develop a severe headache or confusion.  Have unusual weakness or numbness.  Feel faint.  Have severe pain in your chest or abdomen.  Vomit repeatedly.  Have trouble breathing. Summary  Hypertension is when the force of blood pumping through your arteries is too  strong. If this condition is not controlled, it may put you at risk for serious complications.  Your personal target blood pressure may vary depending on your medical conditions, your age, and other factors. For most people, a normal blood pressure is less than 120/80.  Hypertension is treated with lifestyle changes, medicines, or a combination of both. Lifestyle changes include losing weight, eating a healthy, low-sodium diet, exercising more, and limiting alcohol. This information is not intended to replace advice given to you by your health care provider. Make sure you discuss any questions you have with your health care provider. Document Revised: 02/25/2018 Document Reviewed: 02/25/2018 Elsevier Patient Education  [place].   Zoster Vaccine, Recombinant injection What is this medicine? ZOSTER VACCINE (ZOS ter vak SEEN) is used to prevent  shingles in adults 72 years old and over. This vaccine is not used to treat shingles or nerve pain from shingles. This medicine may be used for other purposes; ask your health care provider or pharmacist if you have questions. COMMON BRAND NAME(S): [facility] What should I tell my health care provider before I take this medicine? They need to know if you have any of these conditions:  blood disorders or disease  cancer like leukemia or lymphoma  immune system problems or therapy  an unusual or allergic reaction to vaccines, other medications, foods, dyes, or preservatives  pregnant or trying to get pregnant  breast-feeding How should I use this medicine? This vaccine is for injection in a muscle. It is given by a health care professional. Talk to your pediatrician regarding the use of this medicine in children. This medicine is not approved for use in children. Overdosage: If you think you have taken too much of this medicine contact a poison control center or emergency room at once. NOTE: This medicine is only for you. Do not share this  medicine with others. What if I miss a dose? Keep appointments for follow-up (booster) doses as directed. It is important not to miss your dose. Call your doctor or health care professional if you are unable to keep an appointment. What may interact with this medicine?  medicines that suppress your immune system  medicines to treat cancer  steroid medicines like prednisone or cortisone This list may not describe all possible interactions. Give your health care provider a list of all the medicines, herbs, non-prescription drugs, or dietary supplements you use. Also tell them if you smoke, drink alcohol, or use illegal drugs. Some items may interact with your medicine. What should I watch for while using this medicine? Visit your doctor for regular check ups. This vaccine, like all vaccines, may not fully protect everyone. What side effects may I notice from receiving this medicine? Side effects that you should report to your doctor or health care professional as soon as possible:  allergic reactions like skin rash, itching or hives, swelling of the face, lips, or tongue  breathing problems Side effects that usually do not require medical attention (report these to your doctor or health care professional if they continue or are bothersome):  chills  headache  fever  nausea, vomiting  redness, warmth, pain, swelling or itching at site where injected  tiredness This list may not describe all possible side effects. Call your doctor for medical advice about side effects. You may report side effects to FDA at 1-800-FDA-1088. Where should I keep my medicine? This vaccine is only given in a clinic, pharmacy, doctor's office, or other health care setting and will not be stored at home. NOTE: This sheet is a summary. It may not cover all possible information. If you have questions about this medicine, talk to your doctor, pharmacist, or health care provider.  2020 Elsevier/Gold Standard  (2017-01-27 13:20:30) Zoster Vaccine, Recombinant injection What is this medicine? ZOSTER VACCINE (ZOS ter vak SEEN) is used to prevent shingles in adults 71 years old and over. This vaccine is not used to treat shingles or nerve pain from shingles. This medicine may be used for other purposes; ask your health care provider or pharmacist if you have questions. COMMON BRAND NAME(S): [facility] What should I tell my health care provider before I take this medicine? They need to know if you have any of these conditions:  blood disorders or disease  cancer like  leukemia or lymphoma  immune system problems or therapy  an unusual or allergic reaction to vaccines, other medications, foods, dyes, or preservatives  pregnant or trying to get pregnant  breast-feeding How should I use this medicine? This vaccine is for injection in a muscle. It is given by a health care professional. Talk to your pediatrician regarding the use of this medicine in children. This medicine is not approved for use in children. Overdosage: If you think you have taken too much of this medicine contact a poison control center or emergency room at once. NOTE: This medicine is only for you. Do not share this medicine with others. What if I miss a dose? Keep appointments for follow-up (booster) doses as directed. It is important not to miss your dose. Call your doctor or health care professional if you are unable to keep an appointment. What may interact with this medicine?  medicines that suppress your immune system  medicines to treat cancer  steroid medicines like prednisone or cortisone This list may not describe all possible interactions. Give your health care provider a list of all the medicines, herbs, non-prescription drugs, or dietary supplements you use. Also tell them if you smoke, drink alcohol, or use illegal drugs. Some items may interact with your medicine. What should I watch for while using this  medicine? Visit your doctor for regular check ups. This vaccine, like all vaccines, may not fully protect everyone. What side effects may I notice from receiving this medicine? Side effects that you should report to your doctor or health care professional as soon as possible:  allergic reactions like skin rash, itching or hives, swelling of the face, lips, or tongue  breathing problems Side effects that usually do not require medical attention (report these to your doctor or health care professional if they continue or are bothersome):  chills  headache  fever  nausea, vomiting  redness, warmth, pain, swelling or itching at site where injected  tiredness This list may not describe all possible side effects. Call your doctor for medical advice about side effects. You may report side effects to FDA at 1-800-FDA-1088. Where should I keep my medicine? This vaccine is only given in a clinic, pharmacy, doctor's office, or other health care setting and will not be stored at home. NOTE: This sheet is a summary. It may not cover all possible information. If you have questions about this medicine, talk to your doctor, pharmacist, or health care provider.  2020 Elsevier/Gold Standard (2017-01-27 13:20:30)

## 2019-11-01 DIAGNOSIS — H2511 Age-related nuclear cataract, right eye: Secondary | ICD-10-CM | POA: Diagnosis not present

## 2019-11-23 DIAGNOSIS — H2511 Age-related nuclear cataract, right eye: Secondary | ICD-10-CM | POA: Diagnosis not present

## 2019-11-23 DIAGNOSIS — H25811 Combined forms of age-related cataract, right eye: Secondary | ICD-10-CM | POA: Diagnosis not present

## 2019-11-26 ENCOUNTER — Telehealth: Payer: Self-pay | Admitting: Family Medicine

## 2019-11-26 MED ORDER — FLUTICASONE PROPIONATE 50 MCG/ACT NA SUSP: 2.0000 | Freq: Every day | NASAL | 6 refills | Status: DC

## 2019-11-26 NOTE — Telephone Encounter (Signed)
Pt called in asking for a new script of the Fluticasone to be sent to the [facility] on [address].

## 2019-11-26 NOTE — Telephone Encounter (Signed)
Medication filled to pharmacy as requested.   

## 2019-11-30 ENCOUNTER — Other Ambulatory Visit: Payer: Self-pay | Admitting: Hematology and Oncology

## 2019-11-30 DIAGNOSIS — D689 Coagulation defect, unspecified: Secondary | ICD-10-CM

## 2019-11-30 DIAGNOSIS — D61818 Other pancytopenia: Secondary | ICD-10-CM

## 2019-11-30 DIAGNOSIS — K709 Alcoholic liver disease, unspecified: Secondary | ICD-10-CM

## 2019-12-03 ENCOUNTER — Encounter: Payer: Self-pay | Admitting: Hematology and Oncology

## 2019-12-03 ENCOUNTER — Other Ambulatory Visit: Payer: Self-pay

## 2019-12-03 ENCOUNTER — Inpatient Hospital Stay (HOSPITAL_BASED_OUTPATIENT_CLINIC_OR_DEPARTMENT_OTHER): Payer: PPO | Admitting: Hematology and Oncology

## 2019-12-03 ENCOUNTER — Inpatient Hospital Stay: Payer: PPO | Attending: Hematology and Oncology

## 2019-12-03 DIAGNOSIS — Z791 Long term (current) use of non-steroidal anti-inflammatories (NSAID): Secondary | ICD-10-CM | POA: Diagnosis not present

## 2019-12-03 DIAGNOSIS — K760 Fatty (change of) liver, not elsewhere classified: Secondary | ICD-10-CM | POA: Diagnosis not present

## 2019-12-03 DIAGNOSIS — D689 Coagulation defect, unspecified: Secondary | ICD-10-CM | POA: Diagnosis not present

## 2019-12-03 DIAGNOSIS — R161 Splenomegaly, not elsewhere classified: Secondary | ICD-10-CM | POA: Diagnosis not present

## 2019-12-03 DIAGNOSIS — K709 Alcoholic liver disease, unspecified: Secondary | ICD-10-CM | POA: Insufficient documentation

## 2019-12-03 DIAGNOSIS — F1020 Alcohol dependence, uncomplicated: Secondary | ICD-10-CM | POA: Diagnosis not present

## 2019-12-03 DIAGNOSIS — E538 Deficiency of other specified B group vitamins: Secondary | ICD-10-CM | POA: Diagnosis not present

## 2019-12-03 DIAGNOSIS — D61818 Other pancytopenia: Secondary | ICD-10-CM

## 2019-12-03 DIAGNOSIS — Z79899 Other long term (current) drug therapy: Secondary | ICD-10-CM | POA: Insufficient documentation

## 2019-12-03 LAB — COMPREHENSIVE METABOLIC PANEL
ALT: 15 U/L (ref 0–44)
AST: 42 U/L — ABNORMAL HIGH (ref 15–41)
Albumin: 2.6 g/dL — ABNORMAL LOW (ref 3.5–5.0)
Alkaline Phosphatase: 135 U/L — ABNORMAL HIGH (ref 38–126)
Anion gap: 8 (ref 5–15)
BUN: 22 mg/dL (ref 8–23)
CO2: 26 mmol/L (ref 22–32)
Calcium: 8.7 mg/dL — ABNORMAL LOW (ref 8.9–10.3)
Chloride: 104 mmol/L (ref 98–111)
Creatinine, Ser: 0.99 mg/dL (ref 0.61–1.24)
GFR calc Af Amer: >60 mL/min (ref >60)
GFR calc non Af Amer: >60 mL/min (ref >60)
Glucose, Bld: 141 mg/dL — ABNORMAL HIGH (ref 70–99)
Potassium: 4.5 mmol/L (ref 3.5–5.1)
Sodium: 138 mmol/L (ref 135–145)
Total Bilirubin: 4.4 mg/dL (ref 0.3–1.2)
Total Protein: 5.3 g/dL — ABNORMAL LOW (ref 6.5–8.1)

## 2019-12-03 LAB — PROTIME-INR
INR: 1.5 — ABNORMAL HIGH (ref 0.8–1.2)
Prothrombin Time: 17.7 seconds — ABNORMAL HIGH (ref 11.4–15.2)

## 2019-12-03 LAB — CBC WITH DIFFERENTIAL/PLATELET
Abs Immature Granulocytes: 0.01 10*3/uL (ref 0.00–0.07)
Basophils Absolute: 0 10*3/uL (ref 0.0–0.1)
Basophils Relative: 1 %
Eosinophils Absolute: 0.2 10*3/uL (ref 0.0–0.5)
Eosinophils Relative: 5 %
HCT: 31.3 % — ABNORMAL LOW (ref 39.0–52.0)
Hemoglobin: 10.4 g/dL — ABNORMAL LOW (ref 13.0–17.0)
Immature Granulocytes: 0 %
Lymphocytes Relative: 26 %
Lymphs Abs: 0.8 10*3/uL (ref 0.7–4.0)
MCH: 36.6 pg — ABNORMAL HIGH (ref 26.0–34.0)
MCHC: 33.2 g/dL (ref 30.0–36.0)
MCV: 110.2 fL — ABNORMAL HIGH (ref 80.0–100.0)
Monocytes Absolute: 0.3 10*3/uL (ref 0.1–1.0)
Monocytes Relative: 11 %
Neutro Abs: 1.7 10*3/uL (ref 1.7–7.7)
Neutrophils Relative %: 57 %
Platelets: 55 10*3/uL — ABNORMAL LOW (ref 150–400)
RBC: 2.84 MIL/uL — ABNORMAL LOW (ref 4.22–5.81)
RDW: 14.9 % (ref 11.5–15.5)
WBC: 3 10*3/uL — ABNORMAL LOW (ref 4.0–10.5)
nRBC: 0 % (ref 0.0–0.2)

## 2019-12-03 MED ORDER — FUROSEMIDE 40 MG PO TABS: 40.0000 mg | ORAL_TABLET | Freq: Two times a day (BID) | ORAL | 1 refills | Status: DC | PRN

## 2019-12-03 MED ORDER — POTASSIUM CHLORIDE CRYS ER 20 MEQ PO TBCR: 20.0000 meq | EXTENDED_RELEASE_TABLET | Freq: Two times a day (BID) | ORAL | 6 refills | Status: DC | PRN

## 2019-12-03 NOTE — Progress Notes (Signed)
Timothy Mckinney OFFICE PROGRESS NOTE  Timothy Minium, MD  ASSESSMENT & PLAN:  Alcoholic liver disease Timothy Mckinney) His calculated results of multiple blood test put him at child Pugh score A The patient continues to drink on a regular basis We discussed the importance of alcohol cessation  Other pancytopenia ([place]) His chronic pancytopenia is stable At this point in time, he does not need transfusion support I will see him back in a year for further follow-up  Coagulopathy ([place]) He is coagulopathic with elevated INR and is at risk of bleeding due to thrombocytopenia as well He does not need transfusion support at this point   No orders of the defined types were placed in this encounter.   The total time spent in the appointment was 20 minutes encounter with patients including review of chart and various tests results, discussions about plan of care and coordination of care plan   All questions were answered. The patient knows to call the clinic with any problems, questions or concerns. No barriers to learning was detected.    Timothy Lark, MD 6/4/20211:52 PM  INTERVAL HISTORY: Timothy Mckinney 69 y.o. 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 He has not established with a GI physician   SUMMARY OF HEMATOLOGIC HISTORY:  Timothy Mckinney was transferred to my care after his prior physician has left.  I reviewed the patient's records extensive and collaborated the history with the patient. Summary of his history is as follows: The patient was seen by another physician for evaluation of pancytopenia.   Ultrasound of the abdomen from November 2018 showed fatty liver disease with  splenomegaly He underwent CT-guided bone marrow biopsy in December 2018 which show hypercellular bone marrow with no features of myelodysplastic syndrome He was also found to have folate deficiency Overall, the cause of pancytopenia was due to alcoholism and splenomegaly He was being observed   I have reviewed the past medical history, past surgical history, social history and family history with the patient and they are unchanged from previous note.  ALLERGIES:  is allergic to penicillins.  MEDICATIONS:  Current Outpatient Medications  Medication Sig Dispense Refill  . celecoxib (CELEBREX) 100 MG capsule TAKE ONE CAPSULE BY MOUTH TWICE A DAY 60 capsule 3  . cycloSPORINE (RESTASIS) 0.05 % ophthalmic emulsion Place 1 drop into both eyes daily.    . diphenhydrAMINE (BENADRYL) 25 MG tablet Take 25 mg by mouth. ONE AT BEDTIME IF NEEDED SLEEP    . fluticasone (FLONASE) 50 MCG/ACT nasal spray Place 2 sprays into both nostrils daily. 16 g 6  . furosemide (LASIX) 40 MG tablet Take 1 tablet (40 mg total) by mouth 2 (two) times daily as needed for fluid or edema. 30 tablet 1  . metoprolol succinate (TOPROL-XL) 25 MG 24 hr tablet Take 1 tablet (25 mg total) by mouth daily. 90 tablet 0  . ondansetron (ZOFRAN) 4 MG tablet TAKE 1 TABLET(4 MG) BY MOUTH EVERY 8 HOURS AS NEEDED FOR NAUSEA OR VOMITING 60 tablet 1  . potassium chloride SA (KLOR-CON) 20 MEQ tablet Take 1 tablet (20 mEq total) by mouth 2 (two) times daily as needed. 30 tablet 6  . sildenafil (VIAGRA) 100 MG tablet TAKE 1 TABLET BY  MOUTH ONCE DAILY AS NEEDED FOR ERECTILE DYSFUNCTION  3  . TURMERIC PO Take by mouth daily.     No current facility-administered medications for this visit.     REVIEW OF SYSTEMS:   Constitutional: Denies fevers, chills or night sweats Eyes: Denies blurriness of vision Ears, nose, mouth, throat, and face: Denies mucositis or sore throat Respiratory: Denies cough, dyspnea or wheezes Cardiovascular: Denies  palpitation, chest discomfort or lower extremity swelling Gastrointestinal:  Denies nausea, heartburn or change in bowel habits Skin: Denies abnormal skin rashes Lymphatics: Denies new lymphadenopathy  Neurological:Denies numbness, tingling or new weaknesses Behavioral/Psych: Mood is stable, no new changes  All other systems were reviewed with the patient and are negative.  PHYSICAL EXAMINATION: ECOG PERFORMANCE STATUS: 0 - Asymptomatic  Vitals:   12/03/19 1154  BP: (!) 132/53  Pulse: 71  Resp: 18  Temp: 98 F (36.7 C)  SpO2: 100%   Filed Weights   12/03/19 1154  Weight: 238 lb (108 kg)    GENERAL:alert, no distress and comfortable.  Noted jaundice appearance HEART: Noted mild bilateral lower extremity edema NEURO: alert & oriented x 3 with fluent speech, no focal motor/sensory deficits  LABORATORY DATA:  I have reviewed the data as listed     Component Value Date/Time   NA 138 12/03/2019 1110   NA 136 07/04/2017 1532   K 4.5 12/03/2019 1110   K 4.3 07/04/2017 1532   CL 104 12/03/2019 1110   CO2 26 12/03/2019 1110   CO2 28 07/04/2017 1532   GLUCOSE 141 (H) 12/03/2019 1110   GLUCOSE 168 (H) 07/04/2017 1532   BUN 22 12/03/2019 1110   BUN 9.7 07/04/2017 1532   CREATININE 0.99 12/03/2019 1110   CREATININE 1.73 (H) 02/23/2018 1321   CREATININE 1.04 01/02/2018 1546   CREATININE 0.8 07/04/2017 1532   CALCIUM 8.7 (L) 12/03/2019 1110   CALCIUM 8.5 07/04/2017 1532   PROT 5.3 (L) 12/03/2019 1110   PROT 6.5 07/04/2017 1532   ALBUMIN 2.6 (L) 12/03/2019 1110   ALBUMIN 2.9 (L) 07/04/2017 1532   AST 42 (H) 12/03/2019 1110   AST 54 (H) 02/23/2018 1321   AST 100 (H) 07/04/2017 1532   ALT 15 12/03/2019 1110   ALT 19 02/23/2018 1321   ALT 26 07/04/2017 1532   ALKPHOS 135 (H) 12/03/2019 1110   ALKPHOS 125 07/04/2017 1532   BILITOT 4.4 (HH) 12/03/2019 1110   BILITOT 3.2 (H) 02/23/2018 1321   BILITOT 4.59 (HH) 07/04/2017 1532   GFRNONAA >60 12/03/2019 1110   GFRNONAA 39  (L) 02/23/2018 1321   GFRAA >60 12/03/2019 1110   GFRAA 45 (L) 02/23/2018 1321    No results found for: SPEP, UPEP  Lab Results  Component Value Date   WBC 3.0 (L) 12/03/2019   NEUTROABS 1.7 12/03/2019   HGB 10.4 (L) 12/03/2019   HCT 31.3 (L) 12/03/2019   MCV 110.2 (H) 12/03/2019   PLT 55 (L) 12/03/2019      Chemistry      Component Value Date/Time   NA 138 12/03/2019 1110   NA 136 07/04/2017 1532   K 4.5 12/03/2019 1110   K 4.3 07/04/2017 1532   CL 104 12/03/2019 1110   CO2 26 12/03/2019 1110   CO2 28 07/04/2017 1532   BUN 22 12/03/2019 1110   BUN 9.7 07/04/2017 1532   CREATININE 0.99 12/03/2019 1110   CREATININE 1.73 (H) 02/23/2018 1321   CREATININE 1.04 01/02/2018 1546   CREATININE 0.8 07/04/2017 1532  Component Value Date/Time   CALCIUM 8.7 (L) 12/03/2019 1110   CALCIUM 8.5 07/04/2017 1532   ALKPHOS 135 (H) 12/03/2019 1110   ALKPHOS 125 07/04/2017 1532   AST 42 (H) 12/03/2019 1110   AST 54 (H) 02/23/2018 1321   AST 100 (H) 07/04/2017 1532   ALT 15 12/03/2019 1110   ALT 19 02/23/2018 1321   ALT 26 07/04/2017 1532   BILITOT 4.4 (HH) 12/03/2019 1110   BILITOT 3.2 (H) 02/23/2018 1321   BILITOT 4.59 (HH) 07/04/2017 1532

## 2019-12-03 NOTE — Assessment & Plan Note (Signed)
His chronic pancytopenia is stable At this point in time, he does not need transfusion support I will see him back in a year for further follow-up

## 2019-12-03 NOTE — Progress Notes (Signed)
Reported total bilirubin 4.4 to Dr. Alvy Bimler.

## 2019-12-03 NOTE — Assessment & Plan Note (Signed)
He is coagulopathic with elevated INR and is at risk of bleeding due to thrombocytopenia as well He does not need transfusion support at this point

## 2019-12-03 NOTE — Assessment & Plan Note (Signed)
His calculated results of multiple blood test put him at child Pugh score A The patient continues to drink on a regular basis We discussed the importance of alcohol cessation

## 2019-12-04 LAB — AFP TUMOR MARKER: AFP, Serum, Tumor Marker: 6.3 ng/mL (ref 0.0–8.3)

## 2019-12-06 ENCOUNTER — Telehealth: Payer: Self-pay

## 2019-12-06 NOTE — Telephone Encounter (Signed)
Called and left below message. Ask him to call the office for questions. 

## 2019-12-06 NOTE — Telephone Encounter (Signed)
-----   Message from Heath Lark, MD sent at 12/06/2019  4:23 AM EDT ----- Regarding: pls call him, labs are stable, I will see him in 1 year

## 2019-12-07 ENCOUNTER — Telehealth: Payer: Self-pay | Admitting: Hematology and Oncology

## 2019-12-07 NOTE — Telephone Encounter (Signed)
Scheduled per 6/7 sch message. Mailing calendar to pt.

## 2019-12-15 ENCOUNTER — Other Ambulatory Visit: Payer: Self-pay | Admitting: Family Medicine

## 2019-12-17 ENCOUNTER — Other Ambulatory Visit: Payer: Self-pay | Admitting: General Practice

## 2019-12-17 MED ORDER — METOPROLOL SUCCINATE ER 25 MG PO TB24: 25.0000 mg | ORAL_TABLET | Freq: Every day | ORAL | 0 refills | Status: DC

## 2019-12-22 ENCOUNTER — Other Ambulatory Visit: Payer: Self-pay | Admitting: General Practice

## 2019-12-22 MED ORDER — ONDANSETRON HCL 4 MG PO TABS: ORAL_TABLET | ORAL | 1 refills | Status: DC

## 2020-01-17 NOTE — Progress Notes (Deleted)
Chronic Care Management Pharmacy  Name: Timothy Mckinney  MRN: [id number] DOB: 1951-04-28  Chief Complaint/ HPI Timothy Mckinney,  69 y.o. , male presents for their Initial CCM visit with the clinical pharmacist via telephone due to COVID-19 Pandemic.  PCP : [name], MD  Their chronic conditions include: HTN, obesity, hyperlipidemia, GERD, chronic pain.   Weight loss  Diet  pain   Office Visits: 12/05/2019 (Dr. 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 Mild bilateral lower extremity edema  07/28/2019 (PCP): Start Lasix 40 mg once daily, restart potassium, add turmeric.  03/03/2020 (PCP): Added Celebrex 100 mg twice daily; needs 6 month f/u.    Outpatient Encounter Medications as of 01/18/2020  Medication Sig  . celecoxib (CELEBREX) 100 MG capsule TAKE ONE CAPSULE BY MOUTH TWICE A DAY  . cycloSPORINE (RESTASIS) 0.05 % ophthalmic emulsion Place 1 drop into both eyes daily.  . diphenhydrAMINE (BENADRYL) 25 MG tablet Take 25 mg by mouth. ONE AT BEDTIME IF NEEDED SLEEP  . fluticasone (FLONASE) 50 MCG/ACT nasal spray Place 2 sprays into both nostrils daily.  . furosemide (LASIX) 40 MG tablet Take 1 tablet (40 mg total) by mouth 2 (two) times daily as needed for fluid or edema.  . metoprolol succinate (TOPROL-XL) 25 MG 24 hr tablet Take 1 tablet (25 mg total) by mouth daily.  . ondansetron (ZOFRAN) 4 MG tablet TAKE 1 TABLET(4 MG) BY MOUTH EVERY 8 HOURS AS NEEDED FOR NAUSEA OR VOMITING  . potassium chloride SA (KLOR-CON) 20 MEQ tablet Take 1 tablet (20 mEq total) by mouth 2 (two) times daily as needed.  . sildenafil (VIAGRA) 100 MG tablet TAKE 1 TABLET BY MOUTH ONCE DAILY AS NEEDED FOR ERECTILE DYSFUNCTION  . TURMERIC PO Take by mouth daily.   No facility-administered encounter medications on file as of 01/18/2020.   Current Diagnosis/Assessment: Goals Addressed   None    Hypertension   BP Readings from Last 3 Encounters:  12/03/19 (!)  132/53  03/04/19 114/68  01/20/19 112/62   Patient is currently controlled on the following medications: metoprolol succinate 25 mg once daily, furosemide 40 mg once daily as needed, potassium 20 meq daily. Denies dizziness, SOB, chest pain.  Asks about potassium as it relates to current regimen. We discussed potassium lowering effects of furosemide and patient expresses good understanding.    Patient checks BP at home infrequently. Wonders if BP medication dosing could be further reduced. Encourage routine home monitoring at least once weekly to help [place] understand how BP is trending. BP agreed to monitoring plan.   Plan Continue current medications.    Hyperlipidemia, Obesity   Lipid Panel     Component Value Date/Time   CHOL 177 03/04/2019 1038   TRIG 138.0 03/04/2019 1038   HDL 36.00 (L) 03/04/2019 1038   CHOLHDL 5 03/04/2019 1038   VLDL 27.6 03/04/2019 1038   LDLCALC 113 (H) 03/04/2019 1038    The 10-year ASCVD risk score Mikey Bussing DC Jr., et al., 2013) is: 40.7%   Values used to calculate the score:     Age: 81 years     Sex: Male     Is Non-Hispanic African American: No     Diabetic: Yes     Tobacco smoker: No     Systolic Blood Pressure: 597 mmHg     Is BP treated: Yes     HDL Cholesterol: 36 mg/dL     Total Cholesterol: 177 mg/dL   Patient is  currently controlled on the following medications: n/a.   Over last yr has managed to lose 70+ pounds through exercise/diet. Has phased out sodas, fries, most meat, and started consuming alternatives such as fruit and protein shakes. LDL borderline high but will continue to be monitored.  Plan Continue control with diet and exercise.   GERD  Patient denies GERD associated symptoms including dysphagia, heartburn or nausea over the past several years. Currently is not taking any medication for acid suppression. ~3 yrs ago had stopped using lemon supplement in water for prevention of kidney stones.  Plan  Continue current  medication.   Vaccines  Reviewed and discussed patient's vaccination history. Due for Shingrix, Tdap. Had previously held off due to concerns of side effects. Is open to further considering these now. We agreed to discuss at follow-up visit.  Immunization History  Administered Date(s) Administered  . Fluad Quad(high Dose 65+) 03/04/2019  . Influenza,inj,Quad PF,6+ Mos 03/03/2018  . Pneumococcal Conjugate-13 08/05/2014  . Pneumococcal Polysaccharide-23 08/09/2015  . Tetanus 07/26/2008   Plan Recommended patient receive Shingrix, Tdap vaccine in office/pharmacy.   Pain  Reports chronic knee/back pain. Currently managed with Celebrex 100 mg twice daily, turmeric daily. Is considering physical therapy as he previously had success with this. Had previously used Voltaren gel but has not used recently due to concern with it interfering with Celebrex and turmeric. Counseled patient on appropriate use of topical Voltaren gel, is willing to try this again.  Plan Continue current medications, restart Voltaren gel.  Medication Management  Pt uses [facility] for all medications. Does not report having any issues remembering to take medications or wanting support through delivery, med synch. etc.  Plan Continue current medication management strategy. Follow up: 3 month phone visit ______________ Visit Information SDOH (Social Determinants of Health) assessments performed: Yes.   Food Insecurity: No Food Insecurity  . Worried About Charity fundraiser in the Last Year: Never true  . Ran Out of Food in the Last Year: Never true     Transportation Needs: No Transportation Needs  . Lack of Transportation (Medical): No  . Lack of Transportation (Non-Medical): No   Mr. Harkleroad was given information about Chronic Care Management services today including:  1. CCM service includes personalized support from designated clinical staff supervised by his physician, including individualized plan  of care and coordination with other care providers 2. 24/7 contact phone numbers for assistance for urgent and routine care needs. 3. Standard insurance, coinsurance, copays and deductibles apply for chronic care management only during months in which we provide at least 20 minutes of these services. Most insurances cover these services at 100%, however patients may be responsible for any copay, coinsurance and/or deductible if applicable. This service may help you avoid the need for more expensive face-to-face services. 4. Only one practitioner may furnish and bill the service in a calendar month. 5. The patient may stop CCM services at any time (effective at the end of the month) by phone call to the office staff.  Patient agreed to services and verbal consent obtained.   Madelin Rear, Pharm.D. Clinical Pharmacist [facility] at [facility] [phone]

## 2020-01-18 ENCOUNTER — Ambulatory Visit: Payer: PPO

## 2020-01-20 ENCOUNTER — Ambulatory Visit: Payer: Self-pay

## 2020-01-20 DIAGNOSIS — I10 Essential (primary) hypertension: Secondary | ICD-10-CM

## 2020-01-20 DIAGNOSIS — E785 Hyperlipidemia, unspecified: Secondary | ICD-10-CM

## 2020-01-20 NOTE — Progress Notes (Signed)
Chronic Care Management Pharmacy  Name: Timothy Mckinney  MRN: [id number] DOB: [birth year]  Chief Complaint/ HPI Timothy Mckinney,  69 y.o. , male presents for their Follow-Up CCM visit with the clinical pharmacist via telephone due to COVID-19 Pandemic.  PCP : [name], MD  No longer taking furosemide, has not had any issues with fluid retention per patient.   Mentions ongoing arthritis pain and a bump on his wrist, wanting to be seen by PCP. Appointment scheduling coordinated.   Their chronic conditions include: HTN, obesity, hyperlipidemia, GERD, chronic pain.  Office Visits: 07/28/2019 (PCP): Start Lasix 40 mg once daily, restart potassium, add turmeric.  03/03/2020 (PCP): Added Celebrex 100 mg twice daily; needs 6 month f/u.    Patient Active Problem List   Diagnosis Date Noted  . Edema 01/20/2019  . Coagulopathy ([place]) 07/06/2018  . Kidney stones 07/06/2018  . Lower extremity edema 04/30/2018  . Folate deficiency 02/25/2018  . Chronic low back pain 02/11/2018  . 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 Other pancytopenia ([place]) 07/16/2017  . PND (post-nasal drip) 06/17/2016  . Sciatica of right side 06/28/2015  . Generalized anxiety disorder 06/13/2015  . Physical exam 12/09/2014  . Sarcoidosis ([place]) 11/09/2014  . Multiple pulmonary nodules 11/09/2014  . Hilar adenopathy 11/09/2014  . Knee pain   . Pulmonary nodules with adenopathy (since 2001 CT Chest) 10/15/2014  . Obesity (BMI 30-39.9) 08/05/2014  . Hypertension 08/05/2014  . 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 TRANSAMINASES, SERUM, ELEVATED 08/15/2009  . GERD (gastroesophageal reflux disease) 06/12/2009  . NEPHROLITHIASIS, HX OF 06/12/2009  . Hyperuricemia 06/12/2009  . CHEST XRAY, ABNORMAL 06/12/2009   Past Surgical History:  Procedure Laterality Date  . ANAL FISTULECTOMY  06/01/2012   Procedure: FISTULECTOMY ANAL;  Surgeon:  Leighton Ruff, MD;  Location: [facility];  Service: General;  Laterality: N/A;  Mucosal Advancement Flap   . CARDIAC CATHETERIZATION     in 40s, negative  . colon ulcer  2011  . COLONOSCOPY W/ POLYPECTOMY  2011   Dr Sharlett Iles  . KNEE ARTHROSCOPY  2009    L  . KNEE SURGERY     april 2013 total knee replacement removed  . LITHOTRIPSY    . tear duct plugs    . TOTAL KNEE ARTHROPLASTY  2011   Workman's Comp case, Dr Veverly Fells   Social History   Socioeconomic History  . Marital status: Married    Spouse name: [name]  . Number of children: Not on file  . Years of education: Not on file  . Highest education level: Not on file  Occupational History  . Occupation: retired Hotel manager  Tobacco Use  . Smoking status: Never Smoker  . Smokeless tobacco: Never Used  Vaping Use  . Vaping Use: Never used  Substance and Sexual Activity  . Alcohol use: Yes    Comment: one or a few times a week, beer/vodka  . Drug use: No  . Sexual activity: Not on file  Other Topics Concern  . Not on file  Social History Narrative  . Not on file   Social Determinants of Health   Financial Resource Strain:   . Difficulty of Paying Living Expenses:   Food Insecurity: No Food Insecurity  . Worried About Charity fundraiser in the Last Year: Never true  . Ran Out of Food in the Last Year: Never true  Transportation Needs: No Transportation  Needs  . Lack of Transportation (Medical): No  . Lack of Transportation (Non-Medical): No  Physical Activity:   . Days of Exercise per Week:   . Minutes of Exercise per Session:   Stress:   . Feeling of Stress :   Social Connections:   . Frequency of Communication with Friends and Family:   . Frequency of Social Gatherings with Friends and Family:   . Attends Religious Services:   . Active Member of Clubs or Organizations:   . Attends Archivist Meetings:   [place] Kitchen Marital Status:    Family History  Problem Relation Age of Onset  . Heart attack Father 56  .  Colon polyps Mother   . Dementia Mother        died post SDH  . Cancer Paternal Grandfather        throat , pipe smoker  . Colon polyps Sister   . Cancer Sister        Colon, stage 2  . Hearing loss Brother   . Stroke Neg Hx    Allergies  Allergen Reactions  . Penicillins Swelling    Diffuse swelling   Outpatient Encounter Medications as of 01/20/2020  Medication Sig  . celecoxib (CELEBREX) 100 MG capsule TAKE ONE CAPSULE BY MOUTH TWICE A DAY  . cycloSPORINE (RESTASIS) 0.05 % ophthalmic emulsion Place 1 drop into both eyes daily.  . diphenhydrAMINE (BENADRYL) 25 MG tablet Take 25 mg by mouth. ONE AT BEDTIME IF NEEDED SLEEP  . fluticasone (FLONASE) 50 MCG/ACT nasal spray Place 2 sprays into both nostrils daily.  . metoprolol succinate (TOPROL-XL) 25 MG 24 hr tablet Take 1 tablet (25 mg total) by mouth daily.  . sildenafil (VIAGRA) 100 MG tablet TAKE 1 TABLET BY MOUTH ONCE DAILY AS NEEDED FOR ERECTILE DYSFUNCTION  . furosemide (LASIX) 40 MG tablet Take 1 tablet (40 mg total) by mouth 2 (two) times daily as needed for fluid or edema.  . ondansetron (ZOFRAN) 4 MG tablet TAKE 1 TABLET(4 MG) BY MOUTH EVERY 8 HOURS AS NEEDED FOR NAUSEA OR VOMITING  . potassium chloride SA (KLOR-CON) 20 MEQ tablet Take 1 tablet (20 mEq total) by mouth 2 (two) times daily as needed.  . TURMERIC PO Take by mouth daily.   No facility-administered encounter medications on file as of 01/20/2020.   Patient Care Team    Relationship Specialty Notifications Start End  [name], MD PCP - General Family Medicine  08/05/14   Christy Sartorius, MD Referring Physician Urology  12/08/14   Noralee Space, MD Consulting Physician Pulmonary Disease  12/08/14   Juanita Craver, MD Consulting Physician Gastroenterology  12/09/14   Tamela Oddi, [place] Physician Oral Surgery  04/04/17   Madelin Rear, [facility] Pharmacist Pharmacist  10/06/19    Comment: phone number [phone]   Current  Diagnosis/Assessment: Goals Addressed            This Visit's Progress   . PharmD Care Plan       CARE PLAN ENTRY  Current Barriers:  . Chronic Disease Management support, education, and care coordination needs related to high blood pressure, high cholesterol, pain.  Pharmacist Clinical Goal(s):  Over next three months: . LDL Cholesterol <100, BP <130/90, minimize symptoms of pain.   Interventions: . Comprehensive medication review performed. . Care plan revision.  . Coordinated f/u with PCP.  Patient Self Care Activities:  . Patient verbalizes understanding of plan to monitor blood pressure at least once weekly over  the next three months. . Please follow-up with PCP for pump in hand and back pain as discussed. . Consider receiving the Tdap and Shingrix vaccines. The Shingrix vaccine is 2 doses, with the 2nd being 2-6 months after the 1st dose.       Hypertension   BP goal:  <140/90  BP Readings from Last 3 Encounters:  12/03/19 (!) 132/53  03/04/19 114/68  01/20/19 112/62   Patient checks BP at home several times per month. Denies any elevated BP or dizziness, cp.  We discussed diet and exercise extensively.  Plan  Continue current medications and control with diet and exercise    Hyperlipidemia, Obesity   LDL goal: < 100     Component Value Date/Time   CHOL 177 03/04/2019 1038   TRIG 138.0 03/04/2019 1038   HDL 36.00 (L) 03/04/2019 1038   CHOLHDL 5 03/04/2019 1038   VLDL 27.6 03/04/2019 1038   LDLCALC 113 (H) 03/04/2019 1038    LDL 113 03/2019. Elevated liver enzymes have prevented use of statin. ASCVD 40.7%. Currently Pugh score A per Dr. Alvy Bimler.   We discussed f/u with PCP.  Plan  Continue control with diet and exercise.   Medication Management   Receives prescription medications from:  [id number] [place], [place] - [place] [address] [place]  [address] Phone: [phone] Fax: [place] [place] [address] Phone: [phone] Fax: [phone]  [facility] [address] [place], [place]. [place]. [place] [address] Phone: [phone] Fax: [phone]  Denies any issues with current medication management.   Plan  Continue current medication management strategy.  Follow up: 3 month phone visit.  Madelin Rear, Pharm.D., [facility] [phone]

## 2020-01-24 ENCOUNTER — Ambulatory Visit: Payer: PPO | Admitting: Family Medicine

## 2020-01-24 NOTE — Patient Instructions (Signed)
Please call me at [phone] (direct line) with any questions - thank you!  - Edyth Gunnels., Clinical Pharmacist  Goals Addressed            This Visit's Progress   . PharmD Care Plan       CARE PLAN ENTRY  Current Barriers:  . Chronic Disease Management support, education, and care coordination needs related to high blood pressure, high cholesterol, pain.  Pharmacist Clinical Goal(s):  Over next three months: . LDL Cholesterol <100, BP <130/90, minimize symptoms of pain.   Interventions: . Comprehensive medication review performed. . Care plan revision.  . Coordinated f/u with PCP.  Patient Self Care Activities:  . Patient verbalizes understanding of plan to monitor blood pressure at least once weekly over the next three months. . Please follow-up with PCP for pump in hand and back pain as discussed. . Consider receiving the Tdap and Shingrix vaccines. The Shingrix vaccine is 2 doses, with the 2nd being 2-6 months after the 1st dose.       Mr. Timothy Mckinney was given information about Chronic Care Management services today including:  1. CCM service includes personalized support from designated clinical staff supervised by his physician, including individualized plan of care and coordination with other care providers 2. 24/7 contact phone numbers for assistance for urgent and routine care needs. 3. Standard insurance, coinsurance, copays and deductibles apply for chronic care management only during months in which we provide at least 20 minutes of these services. Most insurances cover these services at 100%, however patients may be responsible for any copay, coinsurance and/or deductible if applicable. This service may help you avoid the need for more expensive face-to-face services. 4. Only one practitioner may furnish and Timothy Mckinney the service in a calendar month. 5. The patient may stop CCM services at any time (effective at the end of the month) by phone call to the office staff.  Patient  agreed to services and verbal consent obtained.   The patient verbalized understanding of instructions provided today and agreed to receive a mailed copy of patient instruction and/or educational materials. Telephone follow up appointment with pharmacy team member scheduled for: See next appointment with "Care Management Staff" under "What's Next" below.   Thank you!  Madelin Rear, Pharm.D., BCGP Clinical Pharmacist [facility] at [facility] [phone]  High Cholesterol  High cholesterol is a condition in which the blood has high levels of a white, waxy, fat-like substance (cholesterol). The human body needs small amounts of cholesterol. The liver makes all the cholesterol that the body needs. Extra (excess) cholesterol comes from the food that we eat. Cholesterol is carried from the liver by the blood through the blood vessels. If you have high cholesterol, deposits (plaques) may build up on the walls of your blood vessels (arteries). Plaques make the arteries narrower and stiffer. Cholesterol plaques increase your risk for heart attack and stroke. Work with your health care provider to keep your cholesterol levels in a healthy range. What increases the risk? This condition is more likely to develop in people who:  Eat foods that are high in animal fat (saturated fat) or cholesterol.  Are overweight.  Are not getting enough exercise.  Have a family history of high cholesterol. What are the signs or symptoms? There are no symptoms of this condition. How is this diagnosed? This condition may be diagnosed from the results of a blood test.  If you are older than age 70, your health care provider may  check your cholesterol every 4-6 years.  You may be checked more often if you already have high cholesterol or other risk factors for heart disease. The blood test for cholesterol measures:  "Bad" cholesterol (LDL cholesterol). This is the main type of cholesterol  that causes heart disease. The desired level for LDL is less than 100.  "Good" cholesterol (HDL cholesterol). This type helps to protect against heart disease by cleaning the arteries and carrying the LDL away. The desired level for HDL is 60 or higher.  Triglycerides. These are fats that the body can store or burn for energy. The desired number for triglycerides is lower than 150.  Total cholesterol. This is a measure of the total amount of cholesterol in your blood, including LDL cholesterol, HDL cholesterol, and triglycerides. A healthy number is less than 200. How is this treated? This condition is treated with diet changes, lifestyle changes, and medicines. Diet changes  This may include eating more whole grains, fruits, vegetables, nuts, and fish.  This may also include cutting back on red meat and foods that have a lot of added sugar. Lifestyle changes  Changes may include getting at least 40 minutes of aerobic exercise 3 times a week. Aerobic exercises include walking, biking, and swimming. Aerobic exercise along with a healthy diet can help you maintain a healthy weight.  Changes may also include quitting smoking. Medicines  Medicines are usually given if diet and lifestyle changes have failed to reduce your cholesterol to healthy levels.  Your health care provider may prescribe a statin medicine. Statin medicines have been shown to reduce cholesterol, which can reduce the risk of heart disease. Follow these instructions at home: Eating and drinking If told by your health care provider:  Eat chicken (without skin), fish, veal, shellfish, ground [place] breast, and round or loin cuts of red meat.  Do not eat fried foods or fatty meats, such as hot dogs and salami.  Eat plenty of fruits, such as apples.  Eat plenty of vegetables, such as broccoli, potatoes, and carrots.  Eat beans, peas, and lentils.  Eat grains such as barley, rice, couscous, and bulgur wheat.  Eat  pasta without cream sauces.  Use skim or nonfat milk, and eat low-fat or nonfat yogurt and cheeses.  Do not eat or drink whole milk, cream, ice cream, egg yolks, or hard cheeses.  Do not eat stick margarine or tub margarines that contain trans fats (also called partially hydrogenated oils).  Do not eat saturated tropical oils, such as coconut oil and palm oil.  Do not eat cakes, cookies, crackers, or other baked goods that contain trans fats.  General instructions  Exercise as directed by your health care provider. Increase your activity level with activities such as gardening, walking, and taking the stairs.  Take over-the-counter and prescription medicines only as told by your health care provider.  Do not use any products that contain nicotine or tobacco, such as cigarettes and e-cigarettes. If you need help quitting, ask your health care provider.  Keep all follow-up visits as told by your health care provider. This is important. Contact a health care provider if:  You are struggling to maintain a healthy diet or weight.  You need help to start on an exercise program.  You need help to stop smoking. Get help right away if:  You have chest pain.  You have trouble breathing. This information is not intended to replace advice given to you by your health care provider. Make sure  you discuss any questions you have with your health care provider. Document Revised: 06/20/2017 Document Reviewed: 12/16/2015 Elsevier Patient Education  [place].

## 2020-02-02 ENCOUNTER — Encounter: Payer: Self-pay | Admitting: Family Medicine

## 2020-02-02 ENCOUNTER — Other Ambulatory Visit: Payer: Self-pay

## 2020-02-02 ENCOUNTER — Ambulatory Visit (INDEPENDENT_AMBULATORY_CARE_PROVIDER_SITE_OTHER): Payer: PPO | Admitting: Family Medicine

## 2020-02-02 VITALS — BP 123/64 | HR 79 | Temp 98.4°F | Resp 16 | Ht 71.0 in | Wt 237.2 lb

## 2020-02-02 DIAGNOSIS — R0982 Postnasal drip: Secondary | ICD-10-CM

## 2020-02-02 DIAGNOSIS — M7989 Other specified soft tissue disorders: Secondary | ICD-10-CM | POA: Diagnosis not present

## 2020-02-02 DIAGNOSIS — M545 Low back pain: Secondary | ICD-10-CM | POA: Diagnosis not present

## 2020-02-02 DIAGNOSIS — G8929 Other chronic pain: Secondary | ICD-10-CM

## 2020-02-02 MED ORDER — LEVOCETIRIZINE DIHYDROCHLORIDE 5 MG PO TABS: 5.0000 mg | ORAL_TABLET | Freq: Every evening | ORAL | 3 refills | Status: DC

## 2020-02-02 NOTE — Patient Instructions (Signed)
Follow up as needed or as scheduled We'll call you with your [facility] appt for the back pain We'll call you with the [facility]ist appt for the ganglion cyst CONTINUE the Flonase- 2 sprays each nostril daily ADD the Xyzal daily to decrease the congestion Call with any questions or concerns Stay Safe!  Stay Healthy!

## 2020-02-02 NOTE — Progress Notes (Signed)
   Subjective:    Patient ID: Timothy Mckinney, male    DOB: 1950-11-21, 69 y.o.   MRN: [id number]  HPI Back pain- 'i'm tired of the back.  We gotta do something'.  Pt reports he can barely walk.  Back pain leaves him 'bent over'.  sxs have worsened over the last few years.  Has tried acupuncture, injxns, OTC creams/meds.  Pt had relief w/ Prolo tx but each time the relief w/ shorter lived.  Has seen Dr Nelva Bush and had MRI at [facility].  Back pain is daily.  Pain is predominately R sided but radiates across to L side.  Pt wants to be able to do more than he is currently able.  Lump- L wrist.  Not painful but gets in the way.  L thumb at IP joint- mildly TTP  'mucous problem'- pt reports he continues to have issues w/ congestion and drainage.  The drainage will cause dry heaves.  Currently using Flonase.  Not currently taking daily antihistamine.   Review of Systems For ROS see HPI   This visit occurred during the SARS-CoV-2 public health emergency.  Safety protocols were in place, including screening questions prior to the visit, additional usage of staff PPE, and extensive cleaning of exam room while observing appropriate contact time as indicated for disinfecting solutions.       Objective:   Physical Exam Vitals reviewed.  Constitutional:      General: He is not in acute distress.    Appearance: Normal appearance. He is obese. He is not ill-appearing.  HENT:     Head: Normocephalic and atraumatic.  Cardiovascular:     Pulses: Normal pulses.  Musculoskeletal:        General: Deformity (soft tissue mass consistent w/ ganglion on L ventral wrist, pea sized soft tissue mass at IP joint of L thumb) present.  Skin:    General: Skin is warm and dry.     Findings: No rash.  Neurological:     General: No focal deficit present.     Mental Status: He is alert and oriented to person, place, and time.  Psychiatric:        Mood and Affect: Mood normal.        Behavior: Behavior normal.         Thought Content: Thought content normal.           Assessment & Plan:  Soft tissue mass- new.  Pt has ganglion cyst on ventral surface of L wrist and much smaller (and tender) mass in IP joint of L thumb.  Refer to hand specialist for complete evaluation and tx.  Pt expressed understanding and is in agreement w/ plan.

## 2020-02-03 NOTE — Assessment & Plan Note (Signed)
Deteriorated.  Pt indicates this is interfering in his daily activities and keeps him from doing what he wants to do.  Has previously seen Dr Nelva Bush at [facility] and has had MRI and injections.  Will refer to his partner, Dr Rolena Infante, at this time to determine the next steps.  Pt expressed understanding and is in agreement w/ plan.

## 2020-02-03 NOTE — Assessment & Plan Note (Signed)
Ongoing issue for pt.  Currently using Flonase but not daily antihistamine.  Will start Xyzal in addition to nasal steroid.  Pt expressed understanding and is in agreement w/ plan.

## 2020-02-04 DIAGNOSIS — M545 Low back pain: Secondary | ICD-10-CM | POA: Diagnosis not present

## 2020-02-07 ENCOUNTER — Telehealth: Payer: Self-pay | Admitting: Family Medicine

## 2020-02-07 NOTE — Telephone Encounter (Signed)
Chart updated to reflect 

## 2020-02-07 NOTE — Telephone Encounter (Signed)
Pt called in stating that he received the Moderna covid shot at [facility]  1st - 08/13/19 lot # [id number] 2nd- 09/10/19 lot # [id number]

## 2020-02-09 DIAGNOSIS — R2232 Localized swelling, mass and lump, left upper limb: Secondary | ICD-10-CM | POA: Diagnosis not present

## 2020-02-09 DIAGNOSIS — M67432 Ganglion, left wrist: Secondary | ICD-10-CM | POA: Diagnosis not present

## 2020-02-24 DIAGNOSIS — M545 Low back pain: Secondary | ICD-10-CM | POA: Diagnosis not present

## 2020-03-08 ENCOUNTER — Telehealth: Payer: Self-pay | Admitting: Family Medicine

## 2020-03-08 NOTE — Telephone Encounter (Signed)
Left message for patient to schedule Annual Wellness Visit.  Please schedule with Nurse Health Advisor Martha Stanley, RN at [place]  

## 2020-03-09 DIAGNOSIS — M545 Low back pain: Secondary | ICD-10-CM | POA: Diagnosis not present

## 2020-03-09 DIAGNOSIS — E669 Obesity, unspecified: Secondary | ICD-10-CM | POA: Diagnosis not present

## 2020-03-09 DIAGNOSIS — Z6833 Body mass index (BMI) 33.0-33.9, adult: Secondary | ICD-10-CM | POA: Diagnosis not present

## 2020-03-14 ENCOUNTER — Telehealth: Payer: Self-pay | Admitting: Family Medicine

## 2020-03-14 NOTE — Telephone Encounter (Signed)
Encounter made in error. 

## 2020-03-14 NOTE — Telephone Encounter (Signed)
Encounter in error

## 2020-03-17 DIAGNOSIS — M5136 Other intervertebral disc degeneration, lumbar region: Secondary | ICD-10-CM | POA: Diagnosis not present

## 2020-03-17 DIAGNOSIS — M542 Cervicalgia: Secondary | ICD-10-CM | POA: Diagnosis not present

## 2020-03-20 ENCOUNTER — Telehealth: Payer: Self-pay | Admitting: Family Medicine

## 2020-03-20 DIAGNOSIS — R17 Unspecified jaundice: Secondary | ICD-10-CM

## 2020-03-20 DIAGNOSIS — W19XXXA Unspecified fall, initial encounter: Secondary | ICD-10-CM

## 2020-03-20 DIAGNOSIS — R58 Hemorrhage, not elsewhere classified: Secondary | ICD-10-CM

## 2020-03-20 NOTE — Telephone Encounter (Signed)
States patient seen Dr. Rolena Infante (orthopedic surgeon) on Friday 9/17.   States patient fell two weeks ago.  Followed up with Dr. Rolena Infante in regard to fall.  States that Dr. Rolena Infante was to send notes along with a request to have labs drawn to look for rhabdomyolysis.  Spouse would like to know if patient needs to schedule follow up and if so how soon could he get in or if he can just come in for labs.  Please advise.

## 2020-03-20 NOTE — Telephone Encounter (Signed)
Timothy Mckinney is a medical emergency and it seems very odd that this would be a concern resulting from a fall 2 weeks ago.  We can start w/ a lab visit ASAP for CK, BMP, LFTs, and UA- dx fall, jaundice, ecchymosis.  Depending on lab results we will determine appropriate follow up.

## 2020-03-20 NOTE — Telephone Encounter (Signed)
Please advise 

## 2020-03-20 NOTE — Telephone Encounter (Signed)
Labs ordered and pt scheduled for med center in [place].

## 2020-03-21 ENCOUNTER — Other Ambulatory Visit: Payer: Self-pay

## 2020-03-21 ENCOUNTER — Other Ambulatory Visit (INDEPENDENT_AMBULATORY_CARE_PROVIDER_SITE_OTHER): Payer: PPO

## 2020-03-21 DIAGNOSIS — W19XXXA Unspecified fall, initial encounter: Secondary | ICD-10-CM | POA: Diagnosis not present

## 2020-03-21 DIAGNOSIS — R17 Unspecified jaundice: Secondary | ICD-10-CM | POA: Diagnosis not present

## 2020-03-21 DIAGNOSIS — R58 Hemorrhage, not elsewhere classified: Secondary | ICD-10-CM

## 2020-03-21 NOTE — Addendum Note (Signed)
Addended by: Kelle Darting A on: 03/21/2020 10:41 AM   Modules accepted: Orders

## 2020-03-22 ENCOUNTER — Other Ambulatory Visit: Payer: Self-pay | Admitting: General Practice

## 2020-03-22 DIAGNOSIS — R17 Unspecified jaundice: Secondary | ICD-10-CM

## 2020-03-22 LAB — CK TOTAL AND CKMB (NOT AT ARMC)
CK, MB: 1.3 ng/mL (ref 0–5.0)
Relative Index: 1.5 (ref 0–4.0)
Total CK: 84 U/L (ref 44–196)

## 2020-03-22 LAB — HEPATIC FUNCTION PANEL
AG Ratio: 1.2 (calc) (ref 1.0–2.5)
ALT: 22 U/L (ref 9–46)
AST: 51 U/L — ABNORMAL HIGH (ref 10–35)
Albumin: 3 g/dL — ABNORMAL LOW (ref 3.6–5.1)
Alkaline phosphatase (APISO): 125 U/L (ref 35–144)
Bilirubin, Direct: 2.4 mg/dL — ABNORMAL HIGH (ref 0.0–0.2)
Globulin: 2.5 g/dL (calc) (ref 1.9–3.7)
Indirect Bilirubin: 3 mg/dL (calc) — ABNORMAL HIGH (ref 0.2–1.2)
Total Bilirubin: 5.4 mg/dL — ABNORMAL HIGH (ref 0.2–1.2)
Total Protein: 5.5 g/dL — ABNORMAL LOW (ref 6.1–8.1)

## 2020-03-22 LAB — BASIC METABOLIC PANEL
BUN: 22 mg/dL (ref 7–25)
CO2: 30 mmol/L (ref 20–32)
Calcium: 9 mg/dL (ref 8.6–10.3)
Chloride: 100 mmol/L (ref 98–110)
Creat: 1.04 mg/dL (ref 0.70–1.18)
Glucose, Bld: 108 mg/dL — ABNORMAL HIGH (ref 65–99)
Potassium: 4.1 mmol/L (ref 3.5–5.3)
Sodium: 137 mmol/L (ref 135–146)

## 2020-03-24 ENCOUNTER — Other Ambulatory Visit: Payer: Self-pay | Admitting: Family Medicine

## 2020-04-04 ENCOUNTER — Other Ambulatory Visit: Payer: Self-pay | Admitting: Physical Medicine and Rehabilitation

## 2020-04-04 DIAGNOSIS — M545 Low back pain, unspecified: Secondary | ICD-10-CM

## 2020-04-04 DIAGNOSIS — M542 Cervicalgia: Secondary | ICD-10-CM | POA: Diagnosis not present

## 2020-04-04 DIAGNOSIS — M5459 Other low back pain: Secondary | ICD-10-CM | POA: Diagnosis not present

## 2020-04-06 ENCOUNTER — Telehealth: Payer: Self-pay | Admitting: Family Medicine

## 2020-04-06 NOTE — Telephone Encounter (Signed)
Patient has question about his lasix that he is taking -  40 mg - swelling in legs - Knee down to ankle is increasing - please advise

## 2020-04-06 NOTE — Telephone Encounter (Signed)
This would need an appointment either in office or virtual. Ok to use a same day for tomorrow,

## 2020-04-07 ENCOUNTER — Encounter: Payer: Self-pay | Admitting: Family Medicine

## 2020-04-07 ENCOUNTER — Telehealth (INDEPENDENT_AMBULATORY_CARE_PROVIDER_SITE_OTHER): Payer: PPO | Admitting: Family Medicine

## 2020-04-07 ENCOUNTER — Other Ambulatory Visit: Payer: Self-pay

## 2020-04-07 DIAGNOSIS — R600 Localized edema: Secondary | ICD-10-CM

## 2020-04-07 DIAGNOSIS — G8929 Other chronic pain: Secondary | ICD-10-CM | POA: Diagnosis not present

## 2020-04-07 DIAGNOSIS — M5440 Lumbago with sciatica, unspecified side: Secondary | ICD-10-CM | POA: Diagnosis not present

## 2020-04-07 NOTE — Progress Notes (Signed)
I have discussed the procedure for the virtual visit with the patient who has given consent to proceed with assessment and treatment.   Illeana Edick L Ina Scrivens, CMA     

## 2020-04-07 NOTE — Progress Notes (Signed)
Virtual Visit via Video   I connected with patient on 04/07/20 at  2:30 PM EDT by a video enabled telemedicine application and verified that I am speaking with the correct person using two identifiers.  Location patient: Home Location provider: Acupuncturist, Office Persons participating in the virtual visit: Patient, Provider, [place] (Jess B)  I discussed the limitations of evaluation and management by telemedicine and the availability of in person appointments. The patient expressed understanding and agreed to proceed.  Subjective:   HPI:   Leg swelling- pt reports lower legs and ankle 'are blowing up again'.  Started Lasix 40mg  daily ~1 week ago.  L>R, pitting up to knee.  No change in diet.  Pt reports legs remain swollen overnight.  No alcohol x1 month.  Increased water intake due to dry mouth.  Back pain- 'unbearable'.  Pt reports he has been to '7 different doctors' but 'I think I have a good one now' (Dr Ron Agee).  Was put on Tizanidine to help w/ spasm and ibuprofen 800mg .  Ibuprofen caused stomach upset so he has not taken recently.  Pt feels Tizanidine is decreasing urination  ROS:   See pertinent positives and negatives per HPI.  Patient Active Problem List   Diagnosis Date Noted  . Edema 01/20/2019  . Coagulopathy ([place]) 07/06/2018  . Kidney stones 07/06/2018  . Lower extremity edema 04/30/2018  . Folate deficiency 02/25/2018  . Chronic low back pain 02/11/2018  . Alcoholic liver disease ([place]) 07/29/2017  . Other pancytopenia ([place]) 07/16/2017  . PND (post-nasal drip) 06/17/2016  . Generalized anxiety disorder 06/13/2015  . Physical exam 12/09/2014  . Sarcoidosis ([place]) 11/09/2014  . Multiple pulmonary nodules 11/09/2014  . Hilar adenopathy 11/09/2014  . Knee pain   . Pulmonary nodules with adenopathy (since 2001 CT Chest) 10/15/2014  . Obesity (BMI 30-39.9) 08/05/2014  . Hypertension 08/05/2014  . 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 TRANSAMINASES, SERUM, ELEVATED 08/15/2009  . GERD (gastroesophageal reflux disease) 06/12/2009  . NEPHROLITHIASIS, HX OF 06/12/2009  . Hyperuricemia 06/12/2009  . CHEST XRAY, ABNORMAL 06/12/2009    Social History   Tobacco Use  . Smoking status: Never Smoker  . Smokeless tobacco: Never Used  Substance Use Topics  . Alcohol use: Yes    Comment: one or a few times a week, beer/vodka    Current Outpatient Medications:  .  cycloSPORINE (RESTASIS) 0.05 % ophthalmic emulsion, Place 1 drop into both eyes daily., Disp: , Rfl:  .  furosemide (LASIX) 40 MG tablet, Take 1 tablet (40 mg total) by mouth 2 (two) times daily as needed for fluid or edema., Disp: 30 tablet, Rfl: 1 .  metoprolol succinate (TOPROL-XL) 25 MG 24 hr tablet, TAKE 1 TABLET(25 MG) BY MOUTH DAILY, Disp: 90 tablet, Rfl: 0 .  oxyCODONE (OXY IR/ROXICODONE) 5 MG immediate release tablet, Take 5 mg by mouth every 6 (six) hours as needed., Disp: , Rfl:  .  potassium chloride SA (KLOR-CON) 20 MEQ tablet, Take 1 tablet (20 mEq total) by mouth 2 (two) times daily as needed., Disp: 30 tablet, Rfl: 6 .  tiZANidine (ZANAFLEX) 4 MG tablet, Take 2 mg by mouth 3 (three) times daily as needed., Disp: , Rfl:  .  celecoxib (CELEBREX) 100 MG capsule, TAKE ONE CAPSULE BY MOUTH TWICE A DAY (Patient not taking: Reported on 04/07/2020), Disp: 60 capsule, Rfl: 3 .  diphenhydrAMINE (BENADRYL) 25 MG tablet, Take 25  mg by mouth. ONE AT BEDTIME IF NEEDED SLEEP (Patient not taking: Reported on 04/07/2020), Disp: , Rfl:  .  fluticasone (FLONASE) 50 MCG/ACT nasal spray, Place 2 sprays into both nostrils daily. (Patient not taking: Reported on 04/07/2020), Disp: 16 g, Rfl: 6 .  ibuprofen (ADVIL) 800 MG tablet, TAKE 1 TABLET BY MOUTH THREE TIMES DAILY AS NEEDED FOR PAIN. IF TAKING IBUPROFEN DO NOT TAKE CELEBREX (Patient not taking: Reported on 04/07/2020), Disp: , Rfl:  .  levocetirizine (XYZAL) 5 MG tablet, Take 1  tablet (5 mg total) by mouth every evening. (Patient not taking: Reported on 04/07/2020), Disp: 30 tablet, Rfl: 3 .  ondansetron (ZOFRAN) 4 MG tablet, TAKE 1 TABLET(4 MG) BY MOUTH EVERY 8 HOURS AS NEEDED FOR NAUSEA OR VOMITING (Patient not taking: Reported on 04/07/2020), Disp: 60 tablet, Rfl: 1 .  sildenafil (VIAGRA) 100 MG tablet, TAKE 1 TABLET BY MOUTH ONCE DAILY AS NEEDED FOR ERECTILE DYSFUNCTION (Patient not taking: Reported on 02/02/2020), Disp: , Rfl: 3 .  TURMERIC PO, Take by mouth daily. (Patient not taking: Reported on 02/02/2020), Disp: , Rfl:   Allergies  Allergen Reactions  . Penicillins Swelling    Diffuse swelling    Objective:   There were no vitals taken for this visit. AAOx3, NAD NCAT, EOMI No obvious CN deficits Pale but no evidence of jaundice Pt is able to speak clearly, coherently without shortness of breath or increased work of breathing. 1+ pitting edema bilaterally  Thought process is linear.  Mood is appropriate.   Assessment and Plan:   Localized Edema- deteriorated.  Unclear as to cause at this time.  Pt has multiple possible causes- liver dysfxn, pancytopenia- and must also consider CHF.  Increase Lasix to 40mg  BID and pt to get labs to assess- BMP, CBC, TSH, LFTs, BNP.  Encouraged compression, elevation, low Na diet.  Will follow closely.  Back pain- ongoing issue for pt.  Now following w/ Dr Ron Agee and waiting on MRI.  Will continue to follow along.  [name], MD 04/07/2020

## 2020-04-10 ENCOUNTER — Telehealth: Payer: Self-pay | Admitting: Family Medicine

## 2020-04-10 NOTE — Telephone Encounter (Signed)
Having blood work done at [facility] -  Patient wants to know if you need to do a full blood screen and PSA?  If so, can this be added in to the tests already ordered?

## 2020-04-11 ENCOUNTER — Other Ambulatory Visit: Payer: PPO

## 2020-04-11 ENCOUNTER — Other Ambulatory Visit: Payer: Self-pay | Admitting: Family Medicine

## 2020-04-11 ENCOUNTER — Telehealth: Payer: Self-pay | Admitting: Family Medicine

## 2020-04-11 DIAGNOSIS — E785 Hyperlipidemia, unspecified: Secondary | ICD-10-CM

## 2020-04-11 DIAGNOSIS — Z125 Encounter for screening for malignant neoplasm of prostate: Secondary | ICD-10-CM

## 2020-04-11 NOTE — Telephone Encounter (Signed)
Labs ordered per pt's request

## 2020-04-11 NOTE — Telephone Encounter (Signed)
Please advise 

## 2020-04-11 NOTE — Telephone Encounter (Signed)
He can hold his 2nd Lasix due to dizziness.  Continue his water intake.  If he is still dizzy, he should reschedule his appt.  I fear it would take too long to get home health set up and I would prefer once he feels up to it, that he get his labs drawn.

## 2020-04-11 NOTE — Progress Notes (Signed)
PSA as pt requested Will do lipids, too

## 2020-04-11 NOTE — Telephone Encounter (Signed)
Spoke with pt, he changed his lab appt to 1:20. He states that he has become very dizzy and wobbly on his feet. Could the increase in lasix cause the dizziness and weakness in his legs? Should he continue the noon lasix?   Pt did find compression stockings that went up to groin area (seem to be doing ok). Pt states that he is going through at least 3 bottles maybe more as his mouth is always dry.  Pt is wanting to have someone come to his home to draw his labs. I advised that the only way we could have is with home health. He is requesting that we place this so that someone can come to his home and draw his labs and help him out.

## 2020-04-11 NOTE — Telephone Encounter (Signed)
Patient called stating that he is dizzy and not sure that he can go have his labs drawn today.  He would like for you to call him and let him know what his other options are.   Patient states that this is under a time constraint and he would appreciate being call quickly.

## 2020-04-11 NOTE — Telephone Encounter (Signed)
Patient notified of PCP recommendations and is agreement and expresses an understanding.  

## 2020-04-13 ENCOUNTER — Telehealth: Payer: Self-pay

## 2020-04-13 NOTE — Progress Notes (Signed)
  Chronic Care Management   Outreach Note   Name: Timothy Mckinney MRN: [id number] DOB: 11/13/1950  Referred by: [name], MD  Spoke with patient regarding medication-related questions/concerns regarding side effects of tizanidine and opioids. Reviewed upcoming/missed appointments. Encouraged f/u on missed labs.  Future Appointments  Date Time Provider [place]  04/21/2020 10:30 AM [facility] PHARMACIST [facility]  04/24/2020  6:00 PM [facility] MR 3 [facility] [address] [facility]  04/24/2020  6:30 PM [facility] MR 3 [facility]315[facility] [address] [facility]  12/05/2020 11:30 AM [facility] LAB [facility] None  12/05/2020 12:00 PM Heath Lark, MD [facility] None   Madelin Rear, Pharm.D., BCGP Clinical Pharmacist [facility] [phone]

## 2020-04-17 ENCOUNTER — Telehealth: Payer: Self-pay | Admitting: Family Medicine

## 2020-04-17 NOTE — Telephone Encounter (Signed)
Pt's wife called in stating that pt has been vomiting on and off for 2 or 3 days now, she states that he is taking Zofran but it's not helping she wanted to know if there is anything else pt can do. Please advise and [phone]

## 2020-04-17 NOTE — Telephone Encounter (Signed)
Called pt wife back. She advised pt has not been in to see GI in 4-5 years. She will have him begin a clear diet as PCP recommended and continue the zofran. Pt wife advised that if there is any concern for dehydration or symptoms worsen then he needs to be evaluated at a facility that could give IV fluids if needed.

## 2020-04-18 NOTE — Telephone Encounter (Signed)
Agree w/ advice given 

## 2020-04-19 ENCOUNTER — Emergency Department (HOSPITAL_COMMUNITY): Payer: PPO

## 2020-04-19 ENCOUNTER — Other Ambulatory Visit: Payer: Self-pay

## 2020-04-19 ENCOUNTER — Inpatient Hospital Stay (HOSPITAL_COMMUNITY)
Admission: EM | Admit: 2020-04-19 | Discharge: 2020-04-26 | DRG: 432 | Disposition: A | Discharge status: Skilled Nursing Facility | Payer: PPO | Attending: Internal Medicine | Admitting: Internal Medicine

## 2020-04-19 ENCOUNTER — Encounter (HOSPITAL_COMMUNITY): Payer: Self-pay | Admitting: Radiology

## 2020-04-19 DIAGNOSIS — F1020 Alcohol dependence, uncomplicated: Secondary | ICD-10-CM | POA: Diagnosis present

## 2020-04-19 DIAGNOSIS — H04123 Dry eye syndrome of bilateral lacrimal glands: Secondary | ICD-10-CM | POA: Diagnosis not present

## 2020-04-19 DIAGNOSIS — K766 Portal hypertension: Secondary | ICD-10-CM | POA: Diagnosis not present

## 2020-04-19 DIAGNOSIS — R2689 Other abnormalities of gait and mobility: Secondary | ICD-10-CM | POA: Diagnosis not present

## 2020-04-19 DIAGNOSIS — G8929 Other chronic pain: Secondary | ICD-10-CM | POA: Diagnosis present

## 2020-04-19 DIAGNOSIS — E871 Hypo-osmolality and hyponatremia: Secondary | ICD-10-CM | POA: Diagnosis not present

## 2020-04-19 DIAGNOSIS — M479 Spondylosis, unspecified: Secondary | ICD-10-CM | POA: Diagnosis present

## 2020-04-19 DIAGNOSIS — Z6833 Body mass index (BMI) 33.0-33.9, adult: Secondary | ICD-10-CM

## 2020-04-19 DIAGNOSIS — Z87442 Personal history of urinary calculi: Secondary | ICD-10-CM

## 2020-04-19 DIAGNOSIS — I10 Essential (primary) hypertension: Secondary | ICD-10-CM | POA: Diagnosis present

## 2020-04-19 DIAGNOSIS — K219 Gastro-esophageal reflux disease without esophagitis: Secondary | ICD-10-CM | POA: Diagnosis present

## 2020-04-19 DIAGNOSIS — K3189 Other diseases of stomach and duodenum: Secondary | ICD-10-CM | POA: Diagnosis not present

## 2020-04-19 DIAGNOSIS — F411 Generalized anxiety disorder: Secondary | ICD-10-CM | POA: Diagnosis present

## 2020-04-19 DIAGNOSIS — D689 Coagulation defect, unspecified: Secondary | ICD-10-CM | POA: Diagnosis not present

## 2020-04-19 DIAGNOSIS — K7030 Alcoholic cirrhosis of liver without ascites: Secondary | ICD-10-CM | POA: Diagnosis not present

## 2020-04-19 DIAGNOSIS — D6959 Other secondary thrombocytopenia: Secondary | ICD-10-CM | POA: Diagnosis present

## 2020-04-19 DIAGNOSIS — Z79899 Other long term (current) drug therapy: Secondary | ICD-10-CM

## 2020-04-19 DIAGNOSIS — K7200 Acute and subacute hepatic failure without coma: Secondary | ICD-10-CM | POA: Diagnosis not present

## 2020-04-19 DIAGNOSIS — E43 Unspecified severe protein-calorie malnutrition: Secondary | ICD-10-CM | POA: Diagnosis not present

## 2020-04-19 DIAGNOSIS — R52 Pain, unspecified: Secondary | ICD-10-CM | POA: Diagnosis not present

## 2020-04-19 DIAGNOSIS — K7040 Alcoholic hepatic failure without coma: Secondary | ICD-10-CM | POA: Diagnosis not present

## 2020-04-19 DIAGNOSIS — K921 Melena: Secondary | ICD-10-CM | POA: Diagnosis not present

## 2020-04-19 DIAGNOSIS — K31819 Angiodysplasia of stomach and duodenum without bleeding: Secondary | ICD-10-CM | POA: Diagnosis present

## 2020-04-19 DIAGNOSIS — K922 Gastrointestinal hemorrhage, unspecified: Secondary | ICD-10-CM | POA: Diagnosis not present

## 2020-04-19 DIAGNOSIS — Z23 Encounter for immunization: Secondary | ICD-10-CM | POA: Diagnosis not present

## 2020-04-19 DIAGNOSIS — N179 Acute kidney failure, unspecified: Secondary | ICD-10-CM | POA: Diagnosis not present

## 2020-04-19 DIAGNOSIS — E669 Obesity, unspecified: Secondary | ICD-10-CM | POA: Diagnosis present

## 2020-04-19 DIAGNOSIS — E8770 Fluid overload, unspecified: Secondary | ICD-10-CM | POA: Diagnosis present

## 2020-04-19 DIAGNOSIS — K709 Alcoholic liver disease, unspecified: Secondary | ICD-10-CM | POA: Diagnosis not present

## 2020-04-19 DIAGNOSIS — R42 Dizziness and giddiness: Secondary | ICD-10-CM | POA: Diagnosis not present

## 2020-04-19 DIAGNOSIS — I480 Paroxysmal atrial fibrillation: Secondary | ICD-10-CM | POA: Diagnosis not present

## 2020-04-19 DIAGNOSIS — S9031XA Contusion of right foot, initial encounter: Secondary | ICD-10-CM | POA: Diagnosis not present

## 2020-04-19 DIAGNOSIS — K269 Duodenal ulcer, unspecified as acute or chronic, without hemorrhage or perforation: Secondary | ICD-10-CM | POA: Diagnosis not present

## 2020-04-19 DIAGNOSIS — E869 Volume depletion, unspecified: Secondary | ICD-10-CM | POA: Diagnosis present

## 2020-04-19 DIAGNOSIS — Z7401 Bed confinement status: Secondary | ICD-10-CM | POA: Diagnosis not present

## 2020-04-19 DIAGNOSIS — R4182 Altered mental status, unspecified: Secondary | ICD-10-CM | POA: Diagnosis not present

## 2020-04-19 DIAGNOSIS — D61818 Other pancytopenia: Secondary | ICD-10-CM | POA: Diagnosis not present

## 2020-04-19 DIAGNOSIS — I110 Hypertensive heart disease with heart failure: Secondary | ICD-10-CM | POA: Diagnosis not present

## 2020-04-19 DIAGNOSIS — R531 Weakness: Secondary | ICD-10-CM | POA: Diagnosis not present

## 2020-04-19 DIAGNOSIS — K264 Chronic or unspecified duodenal ulcer with hemorrhage: Secondary | ICD-10-CM | POA: Diagnosis not present

## 2020-04-19 DIAGNOSIS — R29810 Facial weakness: Secondary | ICD-10-CM | POA: Diagnosis not present

## 2020-04-19 DIAGNOSIS — Z20822 Contact with and (suspected) exposure to covid-19: Secondary | ICD-10-CM | POA: Diagnosis present

## 2020-04-19 DIAGNOSIS — G9341 Metabolic encephalopathy: Secondary | ICD-10-CM | POA: Diagnosis not present

## 2020-04-19 DIAGNOSIS — M549 Dorsalgia, unspecified: Secondary | ICD-10-CM | POA: Diagnosis present

## 2020-04-19 DIAGNOSIS — R9082 White matter disease, unspecified: Secondary | ICD-10-CM | POA: Diagnosis not present

## 2020-04-19 DIAGNOSIS — K7682 Hepatic encephalopathy: Secondary | ICD-10-CM

## 2020-04-19 DIAGNOSIS — D62 Acute posthemorrhagic anemia: Secondary | ICD-10-CM | POA: Diagnosis not present

## 2020-04-19 DIAGNOSIS — D869 Sarcoidosis, unspecified: Secondary | ICD-10-CM | POA: Diagnosis not present

## 2020-04-19 DIAGNOSIS — E876 Hypokalemia: Secondary | ICD-10-CM | POA: Diagnosis present

## 2020-04-19 DIAGNOSIS — I509 Heart failure, unspecified: Secondary | ICD-10-CM | POA: Diagnosis not present

## 2020-04-19 DIAGNOSIS — R404 Transient alteration of awareness: Secondary | ICD-10-CM | POA: Diagnosis not present

## 2020-04-19 DIAGNOSIS — G4730 Sleep apnea, unspecified: Secondary | ICD-10-CM | POA: Diagnosis present

## 2020-04-19 DIAGNOSIS — D696 Thrombocytopenia, unspecified: Secondary | ICD-10-CM | POA: Diagnosis not present

## 2020-04-19 DIAGNOSIS — M6281 Muscle weakness (generalized): Secondary | ICD-10-CM | POA: Diagnosis not present

## 2020-04-19 DIAGNOSIS — R5381 Other malaise: Secondary | ICD-10-CM | POA: Diagnosis not present

## 2020-04-19 DIAGNOSIS — E538 Deficiency of other specified B group vitamins: Secondary | ICD-10-CM | POA: Diagnosis not present

## 2020-04-19 DIAGNOSIS — Z8249 Family history of ischemic heart disease and other diseases of the circulatory system: Secondary | ICD-10-CM

## 2020-04-19 DIAGNOSIS — E512 Wernicke's encephalopathy: Secondary | ICD-10-CM | POA: Diagnosis not present

## 2020-04-19 DIAGNOSIS — Z880 Allergy status to penicillin: Secondary | ICD-10-CM

## 2020-04-19 DIAGNOSIS — M5136 Other intervertebral disc degeneration, lumbar region: Secondary | ICD-10-CM | POA: Diagnosis present

## 2020-04-19 DIAGNOSIS — Z8719 Personal history of other diseases of the digestive system: Secondary | ICD-10-CM

## 2020-04-19 DIAGNOSIS — D500 Iron deficiency anemia secondary to blood loss (chronic): Secondary | ICD-10-CM | POA: Diagnosis not present

## 2020-04-19 DIAGNOSIS — R41841 Cognitive communication deficit: Secondary | ICD-10-CM | POA: Diagnosis not present

## 2020-04-19 DIAGNOSIS — R410 Disorientation, unspecified: Secondary | ICD-10-CM | POA: Diagnosis not present

## 2020-04-19 DIAGNOSIS — M2550 Pain in unspecified joint: Secondary | ICD-10-CM | POA: Diagnosis not present

## 2020-04-19 DIAGNOSIS — M5489 Other dorsalgia: Secondary | ICD-10-CM | POA: Diagnosis not present

## 2020-04-19 DIAGNOSIS — R402411 Glasgow coma scale score 13-15, in the field [EMT or ambulance]: Secondary | ICD-10-CM | POA: Diagnosis not present

## 2020-04-19 DIAGNOSIS — R000 Tachycardia, unspecified: Secondary | ICD-10-CM | POA: Diagnosis not present

## 2020-04-19 DIAGNOSIS — K7290 Hepatic failure, unspecified without coma: Secondary | ICD-10-CM | POA: Diagnosis not present

## 2020-04-19 LAB — COMPREHENSIVE METABOLIC PANEL
ALT: 30 U/L (ref 0–44)
AST: 54 U/L — ABNORMAL HIGH (ref 15–41)
Albumin: 2.2 g/dL — ABNORMAL LOW (ref 3.5–5.0)
Alkaline Phosphatase: 73 U/L (ref 38–126)
Anion gap: 13 (ref 5–15)
BUN: 56 mg/dL — ABNORMAL HIGH (ref 8–23)
CO2: 25 mmol/L (ref 22–32)
Calcium: 8.2 mg/dL — ABNORMAL LOW (ref 8.9–10.3)
Chloride: 103 mmol/L (ref 98–111)
Creatinine, Ser: 1.57 mg/dL — ABNORMAL HIGH (ref 0.61–1.24)
GFR, Estimated: 44 mL/min — ABNORMAL LOW (ref >60)
Glucose, Bld: 163 mg/dL — ABNORMAL HIGH (ref 70–99)
Potassium: 3.7 mmol/L (ref 3.5–5.1)
Sodium: 141 mmol/L (ref 135–145)
Total Bilirubin: 5 mg/dL — ABNORMAL HIGH (ref 0.3–1.2)
Total Protein: 4.6 g/dL — ABNORMAL LOW (ref 6.5–8.1)

## 2020-04-19 LAB — RAPID URINE DRUG SCREEN, HOSP PERFORMED
Amphetamines: NOT DETECTED
Barbiturates: NOT DETECTED
Benzodiazepines: NOT DETECTED
Cocaine: NOT DETECTED
Opiates: NOT DETECTED
Tetrahydrocannabinol: NOT DETECTED

## 2020-04-19 LAB — DIFFERENTIAL
Abs Immature Granulocytes: 0.1 10*3/uL — ABNORMAL HIGH (ref 0.00–0.07)
Basophils Absolute: 0 10*3/uL (ref 0.0–0.1)
Basophils Relative: 0 %
Eosinophils Absolute: 0 10*3/uL (ref 0.0–0.5)
Eosinophils Relative: 0 %
Immature Granulocytes: 1 %
Lymphocytes Relative: 9 %
Lymphs Abs: 1.3 10*3/uL (ref 0.7–4.0)
Monocytes Absolute: 0.7 10*3/uL (ref 0.1–1.0)
Monocytes Relative: 6 %
Neutro Abs: 11.4 10*3/uL — ABNORMAL HIGH (ref 1.7–7.7)
Neutrophils Relative %: 84 %

## 2020-04-19 LAB — URINALYSIS, ROUTINE W REFLEX MICROSCOPIC
Bilirubin Urine: NEGATIVE
Glucose, UA: NEGATIVE mg/dL
Hgb urine dipstick: NEGATIVE
Ketones, ur: NEGATIVE mg/dL
Leukocytes,Ua: NEGATIVE
Nitrite: NEGATIVE
Protein, ur: NEGATIVE mg/dL
Specific Gravity, Urine: 1.015 (ref 1.005–1.030)
pH: 5.5 (ref 5.0–8.0)

## 2020-04-19 LAB — CBC
HCT: 20.4 % — ABNORMAL LOW (ref 39.0–52.0)
HCT: 18.7 % — ABNORMAL LOW (ref 39.0–52.0)
Hemoglobin: 6.9 g/dL — CL (ref 13.0–17.0)
Hemoglobin: 6.1 g/dL — CL (ref 13.0–17.0)
MCH: 36.1 pg — ABNORMAL HIGH (ref 26.0–34.0)
MCH: 35.3 pg — ABNORMAL HIGH (ref 26.0–34.0)
MCHC: 33.8 g/dL (ref 30.0–36.0)
MCHC: 32.6 g/dL (ref 30.0–36.0)
MCV: 106.8 fL — ABNORMAL HIGH (ref 80.0–100.0)
MCV: 108.1 fL — ABNORMAL HIGH (ref 80.0–100.0)
Platelets: 128 10*3/uL — ABNORMAL LOW (ref 150–400)
Platelets: 92 10*3/uL — ABNORMAL LOW (ref 150–400)
RBC: 1.91 MIL/uL — ABNORMAL LOW (ref 4.22–5.81)
RBC: 1.73 MIL/uL — ABNORMAL LOW (ref 4.22–5.81)
RDW: 15.5 % (ref 11.5–15.5)
RDW: 15.6 % — ABNORMAL HIGH (ref 11.5–15.5)
WBC: 13.6 10*3/uL — ABNORMAL HIGH (ref 4.0–10.5)
WBC: 11.8 10*3/uL — ABNORMAL HIGH (ref 4.0–10.5)
nRBC: 0 % (ref 0.0–0.2)
nRBC: 0 % (ref 0.0–0.2)

## 2020-04-19 LAB — PROTIME-INR
INR: 1.8 — ABNORMAL HIGH (ref 0.8–1.2)
Prothrombin Time: 20.5 seconds — ABNORMAL HIGH (ref 11.4–15.2)

## 2020-04-19 LAB — RESPIRATORY PANEL BY RT PCR (FLU A&B, COVID)
Influenza A by PCR: NEGATIVE
Influenza B by PCR: NEGATIVE
SARS Coronavirus 2 by RT PCR: NEGATIVE

## 2020-04-19 LAB — POC OCCULT BLOOD, ED: Fecal Occult Bld: POSITIVE — AB

## 2020-04-19 LAB — ETHANOL: Alcohol, Ethyl (B): <10 mg/dL (ref <10)

## 2020-04-19 LAB — APTT: aPTT: 41 seconds — ABNORMAL HIGH (ref 24–36)

## 2020-04-19 LAB — PREPARE RBC (CROSSMATCH)

## 2020-04-19 LAB — AMMONIA: Ammonia: 108 umol/L — ABNORMAL HIGH (ref 9–35)

## 2020-04-19 MED ORDER — PANTOPRAZOLE SODIUM 40 MG IV SOLR
40.0000 mg | Freq: Once | INTRAVENOUS | Status: AC
  Administered 2020-04-19: 40 mg via INTRAVENOUS
  Filled 2020-04-19: qty 40

## 2020-04-19 MED ORDER — PANTOPRAZOLE SODIUM 40 MG IV SOLR
40.0000 mg | Freq: Two times a day (BID) | INTRAVENOUS | Status: DC
  Administered 2020-04-19 – 2020-04-21 (×4): 40 mg via INTRAVENOUS
  Filled 2020-04-19 (×4): qty 40

## 2020-04-19 MED ORDER — SODIUM CHLORIDE 0.9% IV SOLUTION: Freq: Once | INTRAVENOUS | Status: DC

## 2020-04-19 MED ORDER — FUROSEMIDE 10 MG/ML IJ SOLN
40.0000 mg | Freq: Once | INTRAMUSCULAR | Status: AC
  Administered 2020-04-19: 40 mg via INTRAVENOUS
  Filled 2020-04-19: qty 4

## 2020-04-19 MED ORDER — ONDANSETRON HCL 4 MG PO TABS: 4.0000 mg | ORAL_TABLET | Freq: Four times a day (QID) | ORAL | Status: DC | PRN

## 2020-04-19 MED ORDER — ONDANSETRON HCL 4 MG/2ML IJ SOLN
4.0000 mg | Freq: Four times a day (QID) | INTRAMUSCULAR | Status: DC | PRN
  Administered 2020-04-19 – 2020-04-20 (×2): 4 mg via INTRAVENOUS
  Filled 2020-04-19 (×2): qty 2

## 2020-04-19 MED ORDER — SODIUM CHLORIDE 0.9 % IV SOLN
1.0000 g | Freq: Once | INTRAVENOUS | Status: AC
  Administered 2020-04-19: 1 g via INTRAVENOUS
  Filled 2020-04-19: qty 10

## 2020-04-19 NOTE — ED Notes (Signed)
Wife reported some episodes of nausea, but no emesis. Only dry heaving

## 2020-04-19 NOTE — ED Notes (Signed)
Date and time results received: 04/19/20 1702 (use smartphrase ".now" to insert current time)  Test: Hbg Critical Value: 6.9  Name of Provider Notified: Roslynn Amble, MD  Orders Received? Or Actions Taken?: Orders Received - See Orders for details

## 2020-04-19 NOTE — ED Provider Notes (Signed)
Signout note  70 year old male presents to ER with altered mental status.  History of alcohol abuse, cirrhosis.  4:00 PM Received signout from Dr. Tomi Bamberger, follow-up on labs  5:15 PM rechecked patient, awaiting some labs, discussed history with wife  5:56 PM reviewed labs, reassess patient at bedside, updated wife, performed rectal with RN chaperone, black stools, also noted significant lower extremity edema, remains confused, nonfocal, alert and oriented to person but not place or time.  His blood work is concerning for anemia, AKI, hyperammonemia, elevated PT/INR, elevated bilirubin (chronic).  Suspect GI bleed, uremia and hyperammonemia leading causes of his presentation today.  Will transfuse 1 unit, given cirrhosis, possibility of variceal bleed, will give Protonix, ceftriaxone.  Sent message to [facility] provider on-call for patient to be seen tomorrow morning.  Blood pressure is currently stable.  Will consult the hospitalist service for admission.    CRITICAL CARE Performed by: Lucrezia Starch   Total critical care time: 50 minutes  Critical care time was exclusive of separately billable procedures and treating other patients.  Critical care was necessary to treat or prevent imminent or life-threatening deterioration.  Critical care was time spent personally by me on the following activities: development of treatment plan with patient and/or surrogate as well as nursing, discussions with consultants, evaluation of patient's response to treatment, examination of patient, obtaining history from patient or surrogate, ordering and performing treatments and interventions, ordering and review of laboratory studies, ordering and review of radiographic studies, pulse oximetry and re-evaluation of patient's condition.    Lucrezia Starch, MD 04/19/20 1800

## 2020-04-19 NOTE — ED Provider Notes (Signed)
[facility] DEPT Provider Note   CSN: [id number] Arrival date & time: 04/19/20  1520     History Chief Complaint  Patient presents with  . Altered Mental Status    Timothy Mckinney is a 69 y.o. male.  HPI   Patient presented to the ED for evaluation of altered mental status.  History is somewhat limited as the patient denies any specific complaints and cannot really tell us why he is her.  The electronic medical records however indicate the patient has been having some trouble with edema and weakness.  He had a video visit with his doctor on October 8.  Patient been having difficulty with leg swelling and back pain.  Patient has a history of chronic back pain.  Patient was seeing a back specialist.  He was started on tizanidine.  Patient had his Lasix increased to twice daily and laboratory tests were ordered.  Records also indicate the patient was having increasing dizzy .  Last note on the 18th indicates that the patient had been vomiting on and off for the last few days.  EMS reported the the wife indicated the patient has been getting weaker over several weeks.  This morning however he was not answering questions appropriately.  He also apparently had some falls recently.  Patient does have a history of cirrhosis  Past Medical History:  Diagnosis Date  . Abnormal chest x-ray    ? granulomata  . Arthritis    knees and back   . DDD (degenerative disc disease), lumbar    MRI 08/2005  . Dry eyes, bilateral    Dr Bing Plume  . Elevated liver enzymes    HISTORY OF ELEVATED LIVER ENZYMES  . GERD (gastroesophageal reflux disease)    hx of   . Hypertension    NEW DIAGNOSIS AND STARTED ON METOPROLOL ON 10/04/11  . Hyperuricemia   . 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 Nephrolithiasis    Dr Serita Butcher  . Sleep apnea    STOP BANG SCORE OF 4  . Thrombocytopenia ([place])    HX OF LOW PLATELET COUNT    Patient Active Problem List   Diagnosis Date Noted    . Edema 01/20/2019  . Coagulopathy ([place]) 07/06/2018  . Kidney stones 07/06/2018  . Lower extremity edema 04/30/2018  . Folate deficiency 02/25/2018  . Chronic low back pain 02/11/2018  . Alcoholic liver disease ([place]) 07/29/2017  . Other pancytopenia ([place]) 07/16/2017  . PND (post-nasal drip) 06/17/2016  . Generalized anxiety disorder 06/13/2015  . Physical exam 12/09/2014  . Sarcoidosis ([place]) 11/09/2014  . Multiple pulmonary nodules 11/09/2014  . Hilar adenopathy 11/09/2014  . Knee pain   . Pulmonary nodules with adenopathy (since 2001 CT Chest) 10/15/2014  . Obesity (BMI 30-39.9) 08/05/2014  . Hypertension 08/05/2014  . 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 TRANSAMINASES, SERUM, ELEVATED 08/15/2009  . GERD (gastroesophageal reflux disease) 06/12/2009  . NEPHROLITHIASIS, HX OF 06/12/2009  . Hyperuricemia 06/12/2009  . CHEST XRAY, ABNORMAL 06/12/2009    Past Surgical History:  Procedure Laterality Date  . ANAL FISTULECTOMY  06/01/2012   Procedure: FISTULECTOMY ANAL;  Surgeon: Leighton Ruff, MD;  Location: [facility];  Service: General;  Laterality: N/A;  Mucosal Advancement Flap   . CARDIAC CATHETERIZATION     in 40s, negative  . colon ulcer  2011  . COLONOSCOPY W/ POLYPECTOMY  2011   Dr Sharlett Iles  . KNEE  ARTHROSCOPY  2009    L  . KNEE SURGERY     april 2013 total knee replacement removed  . LITHOTRIPSY    . tear duct plugs    . TOTAL KNEE ARTHROPLASTY  2011   Workman's Comp case, Dr Veverly Fells       Family History  Problem Relation Age of Onset  . Heart attack Father 44  . Colon polyps Mother   . Dementia Mother        died post SDH  . Cancer Paternal Grandfather        throat , pipe smoker  . Colon polyps Sister   . Cancer Sister        Colon, stage 2  . Hearing loss Brother   . Stroke Neg Hx     Social History   Tobacco Use  . Smoking status: Never Smoker  . Smokeless tobacco: Never  Used  Vaping Use  . Vaping Use: Never used  Substance Use Topics  . Alcohol use: Yes    Comment: one or a few times a week, beer/vodka  . Drug use: No    Home Medications Prior to Admission medications   Medication Sig Start Date End Date Taking? Authorizing Provider  cycloSPORINE (RESTASIS) 0.05 % ophthalmic emulsion Place 1 drop into both eyes daily.    [provider]  furosemide (LASIX) 40 MG tablet Take 1 tablet (40 mg total) by mouth 2 (two) times daily as needed for fluid or edema. 12/03/19   Heath Lark, MD  metoprolol succinate (TOPROL-XL) 25 MG 24 hr tablet TAKE 1 TABLET(25 MG) BY MOUTH DAILY 03/27/20   [name], MD  oxyCODONE (OXY IR/ROXICODONE) 5 MG immediate release tablet Take 5 mg by mouth every 6 (six) hours as needed. 04/04/20   [provider]  potassium chloride SA (KLOR-CON) 20 MEQ tablet Take 1 tablet (20 mEq total) by mouth 2 (two) times daily as needed. 12/03/19   Heath Lark, MD  tiZANidine (ZANAFLEX) 4 MG tablet Take 2 mg by mouth 3 (three) times daily as needed. 04/04/20   [provider]    Allergies    Penicillins  Review of Systems   Review of Systems  All other systems reviewed and are negative.   Physical Exam Updated Vital Signs BP (!) 147/60 (BP Location: Left Arm)   Pulse (!) 111   Temp 97.8 F (36.6 C) (Oral)   Resp 15   Ht 1.803 m (5\' 11" )   Wt 107.5 kg   SpO2 100%   BMI 33.05 kg/m   Physical Exam Vitals and nursing note reviewed.  Constitutional:      Appearance: He is well-developed. He is ill-appearing.  HENT:     Head: Normocephalic and atraumatic.     Right Ear: External ear normal.     Left Ear: External ear normal.  Eyes:     General: No scleral icterus.       Right eye: No discharge.        Left eye: No discharge.     Conjunctiva/sclera: Conjunctivae normal.  Neck:     Trachea: No tracheal deviation.  Cardiovascular:     Rate and Rhythm: Normal rate and regular rhythm.  Pulmonary:      Effort: Pulmonary effort is normal. No respiratory distress.     Breath sounds: Normal breath sounds. No stridor. No wheezing or rales.  Abdominal:     General: Bowel sounds are normal. There is no distension.  Palpations: Abdomen is soft.     Tenderness: There is no abdominal tenderness. There is no guarding or rebound.  Musculoskeletal:        General: No tenderness.     Cervical back: Neck supple.     Right lower leg: Edema present.     Left lower leg: Edema present.     Comments: Significant pitting edema bilateral lower extremities  Skin:    General: Skin is warm and dry.     Findings: No rash.  Neurological:     Mental Status: He is alert. He is disoriented.     Cranial Nerves: No cranial nerve deficit (no facial droop, extraocular movements intact, no slurred speech).     Sensory: No sensory deficit.     Motor: No abnormal muscle tone or seizure activity.     Comments: Patient has a tremor noted on exam, find an answer questions but is inappropriate, he is slow to respond, patient does move his extremities but unable to get him to comply with exam assessing his coordination.,  Diffuse weakness     ED Results / Procedures / Treatments   Labs (all labs ordered are listed, but only abnormal results are displayed) Labs Reviewed  CBC - Abnormal; Notable for the following components:      Result Value   WBC 13.6 (*)    RBC 1.91 (*)    Hemoglobin 6.9 (*)    HCT 20.4 (*)    MCV 106.8 (*)    MCH 36.1 (*)    Platelets 128 (*)    All other components within normal limits  DIFFERENTIAL - Abnormal; Notable for the following components:   Neutro Abs 11.4 (*)    Abs Immature Granulocytes 0.10 (*)    All other components within normal limits  RESPIRATORY PANEL BY RT PCR (FLU A&B, COVID)  ETHANOL  PROTIME-INR  APTT  COMPREHENSIVE METABOLIC PANEL  RAPID URINE DRUG SCREEN, HOSP PERFORMED  URINALYSIS, ROUTINE W REFLEX MICROSCOPIC  AMMONIA    EKG Atrial tachycardia rate  112 PVCs Low voltage precordial leads Nonspecific ST-T wave changes No prior EKG for comparison Radiology No results found.  Procedures Procedures (including critical care time)  Medications Ordered in ED Medications - No data to display  ED Course  I have reviewed the triage vital signs and the nursing notes.  Pertinent labs & imaging results that were available during my care of the patient were reviewed by me and considered in my medical decision making (see chart for details).    MDM Rules/Calculators/A&P                          Patient presents to the ED for evaluation of altered mental status.  Patient is clearly confused and is not able to tell me where he is or the date.  Patient does appear tremulous on exam.  He also appears to have significant peripheral edema and is volume overloaded.  Differential diagnosis is broad including complications related to his cirrhosis, hyponatremia, hepatic encephalopathy, occult stroke, occult subdural.  Labs and CT scans have been ordered.  Care has been turned over to Dr. Roslynn Amble Final Clinical Impression(s) / ED Diagnoses pending   Dorie Rank, MD 04/19/20 [id number]

## 2020-04-19 NOTE — H&P (Signed)
History and Physical   Timothy Mckinney TOI:[id number] DOB: 09/12/1950 DOA: 04/19/2020  Referring MD/NP/PA: Dr. Marye Round  PCP: [name], MD   Outpatient Specialists: None  Patient coming from: Home  Chief Complaint: Confusion and yellowness of the skin  HPI: Timothy Mckinney is a 69 y.o. male with medical history significant of hypertension, degenerative disc disease, nephrolithiasis, thrombocytopenia and GERD who apparently sustained a fall a few months ago with injury to neck and back.  Since then he has been weak.  He has not been been doing his normal chores.  Wife notes he was progressively getting confused especially today.  He apparently had another fall and then worsening tremors.  He normally has tremors.  Patient has been previously diagnosed with liver cirrhosis apparently had pedal edema but does not appear to have any GI follow-up plans.  He was being treated for the edema and her Lasix increased after a video visit on October 8.  He has been feeling dizzy and has had nausea vomiting on and off for the last few days.  Here patient looks markedly jaundice and not a good historian so most of the history is obtained from the wife.  He appears to have hepatic encephalopathy but also noted to have GI bleed.  He had guaiac positive stools with decrease in his hemoglobin.  Patient therefore being admitted to the hospital with a combination of hepatic encephalopathy and GI bleed..  ED Course: Temperature 99 blood pressure 150/50 pulse 140 respiratory 90 oxygen sat 94% on room air.  White count is 11.8 hemoglobin 6.1 and platelet count of 92.  Sodium 141 potassium 3.7 chloride 103 CO2 25 BUN 56 creatinine 1.57 calcium 8.2.  INR 1.8 and glucose 163.  Stool guaiac is positive x2.  Head CT without contrast is negative.  Ammonia level 108.  Total bilirubin is 5.0.  X-ray of the right foot also negative.  Patient being admitted to the hospital for further evaluation.  Review of Systems: As  per HPI otherwise 10 point review of systems negative.    Past Medical History:  Diagnosis Date  . Abnormal chest x-ray    ? granulomata  . Arthritis    knees and back   . DDD (degenerative disc disease), lumbar    MRI 08/2005  . Dry eyes, bilateral    Dr Bing Plume  . Elevated liver enzymes    HISTORY OF ELEVATED LIVER ENZYMES  . GERD (gastroesophageal reflux disease)    hx of   . Hypertension    NEW DIAGNOSIS AND STARTED ON METOPROLOL ON 10/04/11  . Hyperuricemia   . 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 Nephrolithiasis    Dr Serita Butcher  . Sleep apnea    STOP BANG SCORE OF 4  . Thrombocytopenia ([place])    HX OF LOW PLATELET COUNT    Past Surgical History:  Procedure Laterality Date  . ANAL FISTULECTOMY  06/01/2012   Procedure: FISTULECTOMY ANAL;  Surgeon: Leighton Ruff, MD;  Location: [facility];  Service: General;  Laterality: N/A;  Mucosal Advancement Flap   . CARDIAC CATHETERIZATION     in 40s, negative  . colon ulcer  2011  . COLONOSCOPY W/ POLYPECTOMY  2011   Dr Sharlett Iles  . KNEE ARTHROSCOPY  2009    L  . KNEE SURGERY     april 2013 total knee replacement removed  . LITHOTRIPSY    . tear duct plugs    . TOTAL KNEE ARTHROPLASTY  2011  Workman's Comp case, Dr Veverly Fells     reports that he has never smoked. He has never used smokeless tobacco. He reports current alcohol use. He reports that he does not use drugs.  Allergies  Allergen Reactions  . Penicillins Swelling    Diffuse swelling    Family History  Problem Relation Age of Onset  . Heart attack Father 5  . Colon polyps Mother   . Dementia Mother        died post SDH  . Cancer Paternal Grandfather        throat , pipe smoker  . Colon polyps Sister   . Cancer Sister        Colon, stage 2  . Hearing loss Brother   . Stroke Neg Hx      Prior to Admission medications   Medication Sig Start Date End Date Taking? Authorizing Provider  cycloSPORINE (RESTASIS) 0.05 % ophthalmic emulsion Place 1 drop  into both eyes daily.    [provider]  furosemide (LASIX) 40 MG tablet Take 1 tablet (40 mg total) by mouth 2 (two) times daily as needed for fluid or edema. 12/03/19   Heath Lark, MD  metoprolol succinate (TOPROL-XL) 25 MG 24 hr tablet TAKE 1 TABLET(25 MG) BY MOUTH DAILY 03/27/20   [name], MD  oxyCODONE (OXY IR/ROXICODONE) 5 MG immediate release tablet Take 5 mg by mouth every 6 (six) hours as needed. 04/04/20   [provider]  potassium chloride SA (KLOR-CON) 20 MEQ tablet Take 1 tablet (20 mEq total) by mouth 2 (two) times daily as needed. 12/03/19   Heath Lark, MD  tiZANidine (ZANAFLEX) 4 MG tablet Take 2 mg by mouth 3 (three) times daily as needed. 04/04/20   [provider]    Physical Exam: Vitals:   04/19/20 1540 04/19/20 1551 04/19/20 1600 04/19/20 1800  BP:   (!) 142/64 (!) 152/60  Pulse:  (!) 111 (!) 111 (!) 114  Resp:  15 11 19   Temp:      TempSrc:      SpO2:  100% 98% 94%  Weight: 107.5 kg     Height: 5\' 11"  (1.803 m)         Constitutional: Acutely ill looking, very jaundiced Vitals:   04/19/20 1540 04/19/20 1551 04/19/20 1600 04/19/20 1800  BP:   (!) 142/64 (!) 152/60  Pulse:  (!) 111 (!) 111 (!) 114  Resp:  15 11 19   Temp:      TempSrc:      SpO2:  100% 98% 94%  Weight: 107.5 kg     Height: 5\' 11"  (1.803 m)      Eyes: PERRL, lids and conjunctivae jaundiced ENMT: Mucous membranes are dry. Posterior pharynx clear of any exudate or lesions.Normal dentition.  Neck: normal, supple, no masses, no thyromegaly Respiratory: Coarse breath sounds with basal crackles. Normal respiratory effort. No accessory muscle use.  Cardiovascular: Sinus tachycardia no murmurs / rubs / gallops. No extremity edema. 2+ pedal pulses. No carotid bruits.  Abdomen: No distention, no ascites, no tenderness, no masses palpated. No hepatosplenomegaly. Bowel sounds positive.  Musculoskeletal: no clubbing / cyanosis. No joint deformity upper and lower  extremities. Good ROM, no contractures. Normal muscle tone.  Skin: no rashes, lesions, ulcers. No induration Neurologic: CN 2-12 grossly intact. Sensation intact, DTR normal. Strength 5/5 in all 4.  Psychiatric: Confused but arousable mostly withdrawn disoriented    Labs on Admission: I have personally reviewed following labs and imaging studies  CBC: Recent Labs  Lab 04/19/20 1616  WBC 13.6*  NEUTROABS 11.4*  HGB 6.9*  HCT 20.4*  MCV 106.8*  PLT 509*   Basic Metabolic Panel: Recent Labs  Lab 04/19/20 1616  NA 141  K 3.7  CL 103  CO2 25  GLUCOSE 163*  BUN 56*  CREATININE 1.57*  CALCIUM 8.2*   GFR: Estimated Creatinine Clearance: 53.8 mL/min (A) (by C-G formula based on SCr of 1.57 mg/dL (H)). Liver Function Tests: Recent Labs  Lab 04/19/20 1616  AST 54*  ALT 30  ALKPHOS 73  BILITOT 5.0*  PROT 4.6*  ALBUMIN 2.2*   No results for input(s): LIPASE, AMYLASE in the last 168 hours. Recent Labs  Lab 04/19/20 1616  AMMONIA 108*   Coagulation Profile: Recent Labs  Lab 04/19/20 1616  INR 1.8*   Cardiac Enzymes: No results for input(s): CKTOTAL, CKMB, CKMBINDEX, TROPONINI in the last 168 hours. BNP (last 3 results) No results for input(s): PROBNP in the last 8760 hours. HbA1C: No results for input(s): HGBA1C in the last 72 hours. CBG: No results for input(s): GLUCAP in the last 168 hours. Lipid Profile: No results for input(s): CHOL, HDL, LDLCALC, TRIG, CHOLHDL, LDLDIRECT in the last 72 hours. Thyroid Function Tests: No results for input(s): TSH, T4TOTAL, FREET4, T3FREE, THYROIDAB in the last 72 hours. Anemia Panel: No results for input(s): VITAMINB12, FOLATE, FERRITIN, TIBC, IRON, RETICCTPCT in the last 72 hours. Urine analysis:    Component Value Date/Time   COLORURINE AMBER (A) 10/15/2014 1020   APPEARANCEUR CLEAR 10/15/2014 1020   LABSPEC 1.019 10/15/2014 1020   PHURINE 7.5 10/15/2014 1020   GLUCOSEU NEGATIVE 10/15/2014 1020   HGBUR NEGATIVE  10/15/2014 1020   BILIRUBINUR NEGATIVE 10/15/2014 1020   KETONESUR NEGATIVE 10/15/2014 1020   PROTEINUR NEGATIVE 10/15/2014 1020   UROBILINOGEN 0.2 10/15/2014 1020   NITRITE NEGATIVE 10/15/2014 1020   LEUKOCYTESUR NEGATIVE 10/15/2014 1020   Sepsis Labs: @LABRCNTIP (procalcitonin:4,lacticidven:4) ) Recent Results (from the past 240 hour(s))  Respiratory Panel by RT PCR (Flu A&B, Covid) - Nasopharyngeal Swab     Status: None   Collection Time: 04/19/20  4:16 PM   Specimen: Nasopharyngeal Swab  Result Value Ref Range Status   SARS Coronavirus 2 by RT PCR NEGATIVE NEGATIVE Final    Comment: (NOTE) SARS-CoV-2 target nucleic acids are NOT DETECTED.  The SARS-CoV-2 RNA is generally detectable in upper respiratoy specimens during the acute phase of infection. The lowest concentration of SARS-CoV-2 viral copies this assay can detect is 131 copies/mL. A negative result does not preclude SARS-Cov-2 infection and should not be used as the sole basis for treatment or other patient management decisions. A negative result may occur with  improper specimen collection/handling, submission of specimen other than nasopharyngeal swab, presence of viral mutation(s) within the areas targeted by this assay, and inadequate number of viral copies (<131 copies/mL). A negative result must be combined with clinical observations, patient history, and epidemiological information. The expected result is Negative.  Fact Sheet for Patients:  PinkCheek.be  Fact Sheet for Healthcare Providers:  GravelBags.it  This test is no t yet approved or cleared by the [place] FDA and  has been authorized for detection and/or diagnosis of SARS-CoV-2 by FDA under an Emergency Use Authorization (EUA). This EUA will remain  in effect (meaning this test can be used) for the duration of the COVID-19 declaration under Section 564(b)(1) of the Act, 21  U.S.C. section 360bbb-3(b)(1), unless the authorization is terminated or revoked sooner.  Influenza A by PCR NEGATIVE NEGATIVE Final   Influenza B by PCR NEGATIVE NEGATIVE Final    Comment: (NOTE) The Xpert Xpress SARS-CoV-2/FLU/RSV assay is intended as an aid in  the diagnosis of influenza from Nasopharyngeal swab specimens and  should not be used as a sole basis for treatment. Nasal washings and  aspirates are unacceptable for Xpert Xpress SARS-CoV-2/FLU/RSV  testing.  Fact Sheet for Patients: PinkCheek.be  Fact Sheet for Healthcare Providers: GravelBags.it  This test is not yet approved or cleared by the [place] FDA and  has been authorized for detection and/or diagnosis of SARS-CoV-2 by  FDA under an Emergency Use Authorization (EUA). This EUA will remain  in effect (meaning this test can be used) for the duration of the  Covid-19 declaration under Section 564(b)(1) of the Act, 21  U.S.C. section 360bbb-3(b)(1), unless the authorization is  terminated or revoked. Performed at [facility], [place] [address], [place],  [address]      Radiological Exams on Admission: CT HEAD WO CONTRAST  Result Date: 04/19/2020 CLINICAL DATA:  Confusion, fall a few months ago. EXAM: CT HEAD WITHOUT CONTRAST TECHNIQUE: Contiguous axial images were obtained from the base of the skull through the vertex without intravenous contrast. COMPARISON:  None. FINDINGS: Brain: Mild chronic small vessel disease within the deep white matter. No acute intracranial abnormality. Specifically, no hemorrhage, hydrocephalus, mass lesion, acute infarction, or significant intracranial injury. Vascular: No hyperdense vessel or unexpected calcification. Skull: No acute calvarial abnormality. Sinuses/Orbits: Visualized paranasal sinuses and mastoids clear. Orbital soft tissues unremarkable. Other: None IMPRESSION: Mild chronic small  vessel disease throughout the deep white matter. No acute intracranial abnormality. Electronically Signed   By: Rolm Baptise M.D.   On: 04/19/2020 17:51   DG Foot Complete Right  Result Date: 04/19/2020 CLINICAL DATA:  Weakness and foot bruising EXAM: RIGHT FOOT COMPLETE - 3+ VIEW COMPARISON:  None. FINDINGS: Generalized soft tissue swelling is noted in the foot and ankle. No acute fracture or dislocation is noted. Calcaneal spurring is seen. IMPRESSION: Soft tissue swelling without acute bony abnormality. Electronically Signed   By: Inez Catalina M.D.   On: 04/19/2020 17:12      Assessment/Plan Principal Problem:   Acute hepatic encephalopathy Active Problems:   GERD (gastroesophageal reflux disease)   Hypertension   Sarcoidosis ([place])   Generalized anxiety disorder   Alcoholic liver disease ([place])     #1 acute hepatic encephalopathy: Patient has not previously been on lactulose.  No ascites.  I will initiate lactulose.  We may start with rectal retention enema in the setting of his GI bleed with n.p.o. status.  Continue to monitor mental status.  GI consulted and will advise more.  #2 GI bleed: Hemoglobin down to 6.1.  1 unit of packed red blood cell ordered.  Patient should probably require at least 2 units at this point.  We will follow H&H closely.  IV Protonix.  GI consult in the morning.  #3 liver cirrhosis: Suspected alcoholic liver disease but also is not fully evaluated.  He will need GI work-up.  Continue close follow-up.  #4 leukocytosis: Probably secondary to GI bleed and demargination.  Continue monitoring.  #5 hypertension: Hold antihypertensives in the setting of GI bleed.  Resume when stabilized.  #6 history of sarcoidosis: Does not appear to have active treatment at the moment.  Monitor.  #7 GERD: Patient will be on IV Protonix.   DVT prophylaxis: SCD Code Status: Full code Family Communication: Wife  at bedside Disposition Plan: To be determined Consults  called: Dr. Ardis Hughs, gastroenterologist Admission status: Inpatient  Severity of Illness: The appropriate patient status for this patient is INPATIENT. Inpatient status is judged to be reasonable and necessary in order to provide the required intensity of service to ensure the patient's safety. The patient's presenting symptoms, physical exam findings, and initial radiographic and laboratory data in the context of their chronic comorbidities is felt to place them at high risk for further clinical deterioration. Furthermore, it is not anticipated that the patient will be medically stable for discharge from the hospital within 2 midnights of admission. The following factors support the patient status of inpatient.   " The patient's presenting symptoms include weakness. " The worrisome physical exam findings include confusion and marked jaundice. " The initial radiographic and laboratory data are worrisome because of hemoglobin 6.1 and guaiac positive stool. " The chronic co-morbidities include liver cirrhosis.   * I certify that at the point of admission it is my clinical judgment that the patient will require inpatient hospital care spanning beyond 2 midnights from the point of admission due to high intensity of service, high risk for further deterioration and high frequency of surveillance required.Barbette Merino MD [facility] Pager [phone]  If 7PM-7AM, please contact night-coverage [email or web address] Password [facility]  04/19/2020, 6:41 PM

## 2020-04-19 NOTE — ED Triage Notes (Signed)
Pt came from home via EMS.  [place] a few months ago, neck and back injury. Progressively weaker over the past several weeks. Wife noticed he was not appropriately answering questions today. Initially, oriented to place only w/ EMS. otw here, oriented to person, place, and situation. Significant contusion to R. Toes d/t a separate fall. Tremors at rest Initial manual BP by EMS: 164/38 PMH: cirrhosis, pedal edema

## 2020-04-20 ENCOUNTER — Inpatient Hospital Stay (HOSPITAL_COMMUNITY): Payer: PPO

## 2020-04-20 DIAGNOSIS — R42 Dizziness and giddiness: Secondary | ICD-10-CM | POA: Diagnosis not present

## 2020-04-20 LAB — COMPREHENSIVE METABOLIC PANEL
ALT: 29 U/L (ref 0–44)
AST: 53 U/L — ABNORMAL HIGH (ref 15–41)
Albumin: 2.3 g/dL — ABNORMAL LOW (ref 3.5–5.0)
Alkaline Phosphatase: 70 U/L (ref 38–126)
Anion gap: 12 (ref 5–15)
BUN: 66 mg/dL — ABNORMAL HIGH (ref 8–23)
CO2: 28 mmol/L (ref 22–32)
Calcium: 8.5 mg/dL — ABNORMAL LOW (ref 8.9–10.3)
Chloride: 98 mmol/L (ref 98–111)
Creatinine, Ser: 1.87 mg/dL — ABNORMAL HIGH (ref 0.61–1.24)
GFR, Estimated: 35 mL/min — ABNORMAL LOW (ref >60)
Glucose, Bld: 141 mg/dL — ABNORMAL HIGH (ref 70–99)
Potassium: 3.9 mmol/L (ref 3.5–5.1)
Sodium: 138 mmol/L (ref 135–145)
Total Bilirubin: 5 mg/dL — ABNORMAL HIGH (ref 0.3–1.2)
Total Protein: 4.7 g/dL — ABNORMAL LOW (ref 6.5–8.1)

## 2020-04-20 LAB — AMMONIA: Ammonia: 59 umol/L — ABNORMAL HIGH (ref 9–35)

## 2020-04-20 LAB — CBC
HCT: 21.7 % — ABNORMAL LOW (ref 39.0–52.0)
HCT: 20.3 % — ABNORMAL LOW (ref 39.0–52.0)
Hemoglobin: 7.3 g/dL — ABNORMAL LOW (ref 13.0–17.0)
Hemoglobin: 7 g/dL — ABNORMAL LOW (ref 13.0–17.0)
MCH: 35.8 pg — ABNORMAL HIGH (ref 26.0–34.0)
MCH: 36.1 pg — ABNORMAL HIGH (ref 26.0–34.0)
MCHC: 33.6 g/dL (ref 30.0–36.0)
MCHC: 34.5 g/dL (ref 30.0–36.0)
MCV: 106.4 fL — ABNORMAL HIGH (ref 80.0–100.0)
MCV: 104.6 fL — ABNORMAL HIGH (ref 80.0–100.0)
Platelets: 85 10*3/uL — ABNORMAL LOW (ref 150–400)
Platelets: 77 10*3/uL — ABNORMAL LOW (ref 150–400)
RBC: 2.04 MIL/uL — ABNORMAL LOW (ref 4.22–5.81)
RBC: 1.94 MIL/uL — ABNORMAL LOW (ref 4.22–5.81)
RDW: 15.5 % (ref 11.5–15.5)
RDW: 15.6 % — ABNORMAL HIGH (ref 11.5–15.5)
WBC: 11.7 10*3/uL — ABNORMAL HIGH (ref 4.0–10.5)
WBC: 9.6 10*3/uL (ref 4.0–10.5)
nRBC: 0.2 % (ref 0.0–0.2)
nRBC: 0 % (ref 0.0–0.2)

## 2020-04-20 LAB — SODIUM, URINE, RANDOM: Sodium, Ur: <10 mmol/L

## 2020-04-20 LAB — CREATININE, URINE, RANDOM: Creatinine, Urine: 84.03 mg/dL

## 2020-04-20 MED ORDER — RIFAXIMIN 550 MG PO TABS
550.0000 mg | ORAL_TABLET | Freq: Two times a day (BID) | ORAL | Status: DC
  Administered 2020-04-20 – 2020-04-25 (×9): 550 mg via ORAL
  Filled 2020-04-20 (×13): qty 1

## 2020-04-20 MED ORDER — OCTREOTIDE LOAD VIA INFUSION
50.0000 ug | Freq: Once | INTRAVENOUS | Status: AC
  Administered 2020-04-20: 50 ug via INTRAVENOUS
  Filled 2020-04-20: qty 25

## 2020-04-20 MED ORDER — LACTULOSE ENEMA
300.0000 mL | Freq: Two times a day (BID) | ORAL | Status: DC
  Filled 2020-04-20: qty 300

## 2020-04-20 MED ORDER — SODIUM CHLORIDE 0.9 % IV SOLN: INTRAVENOUS | Status: DC

## 2020-04-20 MED ORDER — LACTULOSE 10 GM/15ML PO SOLN
30.0000 g | Freq: Three times a day (TID) | ORAL | Status: DC
  Administered 2020-04-20 (×2): 30 g via ORAL
  Filled 2020-04-20 (×2): qty 45

## 2020-04-20 MED ORDER — SODIUM CHLORIDE 0.9 % IV SOLN
50.0000 ug/h | INTRAVENOUS | Status: DC
  Administered 2020-04-20 (×2): 50 ug/h via INTRAVENOUS
  Filled 2020-04-20 (×3): qty 1

## 2020-04-20 NOTE — H&P (View-Only) (Signed)
Reason for Consult: Melena, anemia, and cirrhosis Referring Physician: [facility]  [name] HPI: This is a 69 year old male with a PMH of ETOH abuse and cirrhosis, thrombocytopenia, GERD, colonic polyps, and elevated liver enzymes admitted for AMS.  The history was obtained from his wife.  He was noted to have increased confusion on the day of admission and well as jaundice.  The patient stopped drinking ETOH 4 months ago and for the past several years, post retirement, he was drinking heavily.  He consumed a bottle of vodka every 3 days.  Before his retirement he drank significantly with his job and traveling [place].  His PCP noted that he had abnormal liver enzymes 1.5 years ago and recommended that he be evaluated by a hepatologist, but the patient was in denial.  Only recently did he acquiesce to an appointment with hepatology.  During this admission he was noted to have a drop in his HGB from 10.4 g/dL this past June to 6.1 g/dL on admission.  There is recent history of melena, but no hematochezia, hematemesis, or coffee-ground emesis.  His platelets were documented to be in the 70-90 range.  The patient's INR is currently at 1.8 and his TB is at 5.0.  His TB has not significantly changed over the past 1.5 years.  Past Medical History:  Diagnosis Date  . Abnormal chest x-ray    ? granulomata  . Arthritis    knees and back   . DDD (degenerative disc disease), lumbar    MRI 08/2005  . Dry eyes, bilateral    Dr Bing Plume  . Elevated liver enzymes    HISTORY OF ELEVATED LIVER ENZYMES  . GERD (gastroesophageal reflux disease)    hx of   . Hypertension    NEW DIAGNOSIS AND STARTED ON METOPROLOL ON 10/04/11  . Hyperuricemia   . 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 Nephrolithiasis    Dr Serita Butcher  . Sleep apnea    STOP BANG SCORE OF 4  . Thrombocytopenia ([place])    HX OF LOW PLATELET COUNT    Past Surgical History:  Procedure Laterality Date  . ANAL FISTULECTOMY   06/01/2012   Procedure: FISTULECTOMY ANAL;  Surgeon: Leighton Ruff, MD;  Location: [facility];  Service: General;  Laterality: N/A;  Mucosal Advancement Flap   . CARDIAC CATHETERIZATION     in 40s, negative  . colon ulcer  2011  . COLONOSCOPY W/ POLYPECTOMY  2011   Dr Sharlett Iles  . KNEE ARTHROSCOPY  2009    L  . KNEE SURGERY     april 2013 total knee replacement removed  . LITHOTRIPSY    . tear duct plugs    . TOTAL KNEE ARTHROPLASTY  2011   Workman's Comp case, Dr Veverly Fells    Family History  Problem Relation Age of Onset  . Heart attack Father 67  . Colon polyps Mother   . Dementia Mother        died post SDH  . Cancer Paternal Grandfather        throat , pipe smoker  . Colon polyps Sister   . Cancer Sister        Colon, stage 2  . Hearing loss Brother   . Stroke Neg Hx     Social History:  reports that he has never smoked. He has never used smokeless tobacco. He reports current alcohol use. He reports that he does not use drugs.  Allergies:  Allergies  Allergen Reactions  .  Penicillins Swelling    Diffuse swelling    Medications:  Scheduled: . sodium chloride   Intravenous Once  . lactulose  30 g Oral TID  . pantoprazole (PROTONIX) IV  40 mg Intravenous Q12H   Continuous: . sodium chloride 75 mL/hr at 04/20/20 1146  . octreotide  (SANDOSTATIN)    IV infusion 50 mcg/hr (04/20/20 1310)    Results for orders placed or performed during the hospital encounter of 04/19/20 (from the past 24 hour(s))  Ethanol     Status: None   Collection Time: 04/19/20  4:16 PM  Result Value Ref Range   Alcohol, Ethyl (B) <10 <10 mg/dL  Protime-INR     Status: Abnormal   Collection Time: 04/19/20  4:16 PM  Result Value Ref Range   Prothrombin Time 20.5 (H) 11.4 - 15.2 seconds   INR 1.8 (H) 0.8 - 1.2  APTT     Status: Abnormal   Collection Time: 04/19/20  4:16 PM  Result Value Ref Range   aPTT 41 (H) 24 - 36 seconds  CBC     Status: Abnormal   Collection Time: 04/19/20  4:16 PM   Result Value Ref Range   WBC 13.6 (H) 4.0 - 10.5 K/uL   RBC 1.91 (L) 4.22 - 5.81 MIL/uL   Hemoglobin 6.9 (LL) 13.0 - 17.0 g/dL   HCT 20.4 (L) 39 - 52 %   MCV 106.8 (H) 80.0 - 100.0 fL   MCH 36.1 (H) 26.0 - 34.0 pg   MCHC 33.8 30.0 - 36.0 g/dL   RDW 15.5 11.5 - 15.5 %   Platelets 128 (L) 150 - 400 K/uL   nRBC 0.0 0.0 - 0.2 %  Differential     Status: Abnormal   Collection Time: 04/19/20  4:16 PM  Result Value Ref Range   Neutrophils Relative % 84 %   Neutro Abs 11.4 (H) 1.7 - 7.7 K/uL   Lymphocytes Relative 9 %   Lymphs Abs 1.3 0.7 - 4.0 K/uL   Monocytes Relative 6 %   Monocytes Absolute 0.7 0.1 - 1.0 K/uL   Eosinophils Relative 0 %   Eosinophils Absolute 0.0 0.0 - 0.5 K/uL   Basophils Relative 0 %   Basophils Absolute 0.0 0.0 - 0.1 K/uL   Immature Granulocytes 1 %   Abs Immature Granulocytes 0.10 (H) 0.00 - 0.07 K/uL  Comprehensive metabolic panel     Status: Abnormal   Collection Time: 04/19/20  4:16 PM  Result Value Ref Range   Sodium 141 135 - 145 mmol/L   Potassium 3.7 3.5 - 5.1 mmol/L   Chloride 103 98 - 111 mmol/L   CO2 25 22 - 32 mmol/L   Glucose, Bld 163 (H) 70 - 99 mg/dL   BUN 56 (H) 8 - 23 mg/dL   Creatinine, Ser 1.57 (H) 0.61 - 1.24 mg/dL   Calcium 8.2 (L) 8.9 - 10.3 mg/dL   Total Protein 4.6 (L) 6.5 - 8.1 g/dL   Albumin 2.2 (L) 3.5 - 5.0 g/dL   AST 54 (H) 15 - 41 U/L   ALT 30 0 - 44 U/L   Alkaline Phosphatase 73 38 - 126 U/L   Total Bilirubin 5.0 (H) 0.3 - 1.2 mg/dL   GFR, Estimated 44 (L) >60 mL/min   Anion gap 13 5 - 15  Ammonia     Status: Abnormal   Collection Time: 04/19/20  4:16 PM  Result Value Ref Range   Ammonia 108 (H) 9 - 35 umol/L  Respiratory Panel by RT PCR (Flu A&B, Covid) - Nasopharyngeal Swab     Status: None   Collection Time: 04/19/20  4:16 PM   Specimen: Nasopharyngeal Swab  Result Value Ref Range   SARS Coronavirus 2 by RT PCR NEGATIVE NEGATIVE   Influenza A by PCR NEGATIVE NEGATIVE   Influenza B by PCR NEGATIVE NEGATIVE  Type  and screen [place]     Status: None   Collection Time: 04/19/20  5:57 PM  Result Value Ref Range   ABO/RH(D) O POS    Antibody Screen NEG    Sample Expiration 04/22/2020,2359    Unit Number [id number]    Blood Component Type RBC, LR IRR    Unit division 00    Status of Unit ISSUED,FINAL    Transfusion Status OK TO TRANSFUSE    Crossmatch Result      Compatible Performed at [facility], [place] [address] [place], [place] [address]   Prepare RBC (crossmatch)     Status: None   Collection Time: 04/19/20  5:57 PM  Result Value Ref Range   Order Confirmation      ORDER PROCESSED BY BLOOD BANK Performed at [facility], [place] [address], [place], [place] [address]   POC occult blood, ED     Status: Abnormal   Collection Time: 04/19/20  6:27 PM  Result Value Ref Range   Fecal Occult Bld POSITIVE (A) NEGATIVE  CBC     Status: Abnormal   Collection Time: 04/19/20  8:40 PM  Result Value Ref Range   WBC 11.8 (H) 4.0 - 10.5 K/uL   RBC 1.73 (L) 4.22 - 5.81 MIL/uL   Hemoglobin 6.1 (LL) 13.0 - 17.0 g/dL   HCT 18.7 (L) 39 - 52 %   MCV 108.1 (H) 80.0 - 100.0 fL   MCH 35.3 (H) 26.0 - 34.0 pg   MCHC 32.6 30.0 - 36.0 g/dL   RDW 15.6 (H) 11.5 - 15.5 %   Platelets 92 (L) 150 - 400 K/uL   nRBC 0.0 0.0 - 0.2 %  Urine rapid drug screen (hosp performed)not at [facility]     Status: None   Collection Time: 04/19/20 10:30 PM  Result Value Ref Range   Opiates NONE DETECTED NONE DETECTED   Cocaine NONE DETECTED NONE DETECTED   Benzodiazepines NONE DETECTED NONE DETECTED   Amphetamines NONE DETECTED NONE DETECTED   Tetrahydrocannabinol NONE DETECTED NONE DETECTED   Barbiturates NONE DETECTED NONE DETECTED  Urinalysis, Routine w reflex microscopic     Status: None   Collection Time: 04/19/20 10:30 PM  Result Value Ref Range   Color, Urine YELLOW YELLOW   APPearance CLEAR CLEAR   Specific Gravity, Urine 1.015 1.005 - 1.030   pH 5.5 5.0 - 8.0    Glucose, UA NEGATIVE NEGATIVE mg/dL   Hgb urine dipstick NEGATIVE NEGATIVE   Bilirubin Urine NEGATIVE NEGATIVE   Ketones, ur NEGATIVE NEGATIVE mg/dL   Protein, ur NEGATIVE NEGATIVE mg/dL   Nitrite NEGATIVE NEGATIVE   Leukocytes,Ua NEGATIVE NEGATIVE  Comprehensive metabolic panel     Status: Abnormal   Collection Time: 04/20/20  1:09 AM  Result Value Ref Range   Sodium 138 135 - 145 mmol/L   Potassium 3.9 3.5 - 5.1 mmol/L   Chloride 98 98 - 111 mmol/L   CO2 28 22 - 32 mmol/L   Glucose, Bld 141 (H) 70 - 99 mg/dL   BUN 66 (H) 8 - 23 mg/dL  Creatinine, Ser 1.87 (H) 0.61 - 1.24 mg/dL   Calcium 8.5 (L) 8.9 - 10.3 mg/dL   Total Protein 4.7 (L) 6.5 - 8.1 g/dL   Albumin 2.3 (L) 3.5 - 5.0 g/dL   AST 53 (H) 15 - 41 U/L   ALT 29 0 - 44 U/L   Alkaline Phosphatase 70 38 - 126 U/L   Total Bilirubin 5.0 (H) 0.3 - 1.2 mg/dL   GFR, Estimated 35 (L) >60 mL/min   Anion gap 12 5 - 15  CBC     Status: Abnormal   Collection Time: 04/20/20  1:09 AM  Result Value Ref Range   WBC 11.7 (H) 4.0 - 10.5 K/uL   RBC 2.04 (L) 4.22 - 5.81 MIL/uL   Hemoglobin 7.3 (L) 13.0 - 17.0 g/dL   HCT 21.7 (L) 39 - 52 %   MCV 106.4 (H) 80.0 - 100.0 fL   MCH 35.8 (H) 26.0 - 34.0 pg   MCHC 33.6 30.0 - 36.0 g/dL   RDW 15.5 11.5 - 15.5 %   Platelets 85 (L) 150 - 400 K/uL   nRBC 0.2 0.0 - 0.2 %  CBC     Status: Abnormal   Collection Time: 04/20/20  6:22 AM  Result Value Ref Range   WBC 9.6 4.0 - 10.5 K/uL   RBC 1.94 (L) 4.22 - 5.81 MIL/uL   Hemoglobin 7.0 (L) 13.0 - 17.0 g/dL   HCT 20.3 (L) 39 - 52 %   MCV 104.6 (H) 80.0 - 100.0 fL   MCH 36.1 (H) 26.0 - 34.0 pg   MCHC 34.5 30.0 - 36.0 g/dL   RDW 15.6 (H) 11.5 - 15.5 %   Platelets 77 (L) 150 - 400 K/uL   nRBC 0.0 0.0 - 0.2 %  Ammonia     Status: Abnormal   Collection Time: 04/20/20  8:10 AM  Result Value Ref Range   Ammonia 59 (H) 9 - 35 umol/L     CT HEAD WO CONTRAST  Result Date: 04/19/2020 CLINICAL DATA:  Confusion, fall a few months ago. EXAM: CT  HEAD WITHOUT CONTRAST TECHNIQUE: Contiguous axial images were obtained from the base of the skull through the vertex without intravenous contrast. COMPARISON:  None. FINDINGS: Brain: Mild chronic small vessel disease within the deep white matter. No acute intracranial abnormality. Specifically, no hemorrhage, hydrocephalus, mass lesion, acute infarction, or significant intracranial injury. Vascular: No hyperdense vessel or unexpected calcification. Skull: No acute calvarial abnormality. Sinuses/Orbits: Visualized paranasal sinuses and mastoids clear. Orbital soft tissues unremarkable. Other: None IMPRESSION: Mild chronic small vessel disease throughout the deep white matter. No acute intracranial abnormality. Electronically Signed   By: Rolm Baptise M.D.   On: 04/19/2020 17:51   DG Foot Complete Right  Result Date: 04/19/2020 CLINICAL DATA:  Weakness and foot bruising EXAM: RIGHT FOOT COMPLETE - 3+ VIEW COMPARISON:  None. FINDINGS: Generalized soft tissue swelling is noted in the foot and ankle. No acute fracture or dislocation is noted. Calcaneal spurring is seen. IMPRESSION: Soft tissue swelling without acute bony abnormality. Electronically Signed   By: Inez Catalina M.D.   On: 04/19/2020 17:12    ROS:  As stated above in the HPI otherwise negative.  Blood pressure 108/72, pulse 95, temperature 98.5 F (36.9 C), temperature source Oral, resp. rate 18, height 5\' 11"  (1.803 m), weight 107.5 kg, SpO2 99 %.    PE: Gen: NAD, Alert and Oriented HEENT:  [place]/AT, EOMI Neck: Supple, no LAD Lungs: CTA Bilaterally CV: RRR without  M/G/R ABD: Soft, NTND, +BS Ext: No C/C/E  Assessment/Plan: 1) Melena. 2) Anemia. 3) Hepatic encephalopathy. 4) Decompensated ETOH cirrhosis.   The patient requires further evaluation with an EGD.  The suspicion is that he may have a slow variceal bleed.  No prior EGD was ever performed.  His last colonoscopy was with Dr. [name] in 2018 with findings of some polyps.  There  also no recent imaging.  The last abdominal CT scan was on 10/15/2014 and his liver showed steatosis, which was also the reading for the ultrasound on 05/20/2017.  Currently he is stable, but very slow with mentation.  There is no hemodynamic compromise.  Plan: 1) EGD tomorrow with possible banding. 2) Agree with ceftriaxone. 3) Start octreotide. 4) Continue with lactulose. 5) Start Xifaxan 550 mg BID. 6) Follow HGB, but do not transfuse above 10 g/dL.  [place] D 04/20/2020, 12:13 PM

## 2020-04-20 NOTE — Consult Note (Signed)
Reason for Consult: Melena, anemia, and cirrhosis Referring Physician: [facility]  [name] HPI: This is a 69 year old male with a PMH of ETOH abuse and cirrhosis, thrombocytopenia, GERD, colonic polyps, and elevated liver enzymes admitted for AMS.  The history was obtained from his wife.  He was noted to have increased confusion on the day of admission and well as jaundice.  The patient stopped drinking ETOH 4 months ago and for the past several years, post retirement, he was drinking heavily.  He consumed a bottle of vodka every 3 days.  Before his retirement he drank significantly with his job and traveling abroad.  His PCP noted that he had abnormal liver enzymes 1.5 years ago and recommended that he be evaluated by a hepatologist, but the patient was in denial.  Only recently did he acquiesce to an appointment with hepatology.  During this admission he was noted to have a drop in his HGB from 10.4 g/dL this past June to 6.1 g/dL on admission.  There is recent history of melena, but no hematochezia, hematemesis, or coffee-ground emesis.  His platelets were documented to be in the 70-90 range.  The patient's INR is currently at 1.8 and his TB is at 5.0.  His TB has not significantly changed over the past 1.5 years.  Past Medical History:  Diagnosis Date  . Abnormal chest x-ray    ? granulomata  . Arthritis    knees and back   . DDD (degenerative disc disease), lumbar    MRI 08/2005  . Dry eyes, bilateral    Dr Bing Plume  . Elevated liver enzymes    HISTORY OF ELEVATED LIVER ENZYMES  . GERD (gastroesophageal reflux disease)    hx of   . Hypertension    NEW DIAGNOSIS AND STARTED ON METOPROLOL ON 10/04/11  . Hyperuricemia   . 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 Nephrolithiasis    Dr Serita Butcher  . Sleep apnea    STOP BANG SCORE OF 4  . Thrombocytopenia ([place])    HX OF LOW PLATELET COUNT    Past Surgical History:  Procedure Laterality Date  . ANAL FISTULECTOMY   06/01/2012   Procedure: FISTULECTOMY ANAL;  Surgeon: Leighton Ruff, MD;  Location: [facility];  Service: General;  Laterality: N/A;  Mucosal Advancement Flap   . CARDIAC CATHETERIZATION     in 40s, negative  . colon ulcer  2011  . COLONOSCOPY W/ POLYPECTOMY  2011   Dr Sharlett Iles  . KNEE ARTHROSCOPY  2009    L  . KNEE SURGERY     april 2013 total knee replacement removed  . LITHOTRIPSY    . tear duct plugs    . TOTAL KNEE ARTHROPLASTY  2011   Workman's Comp case, Dr Veverly Fells    Family History  Problem Relation Age of Onset  . Heart attack Father 2  . Colon polyps Mother   . Dementia Mother        died post SDH  . Cancer Paternal Grandfather        throat , pipe smoker  . Colon polyps Sister   . Cancer Sister        Colon, stage 2  . Hearing loss Brother   . Stroke Neg Hx     Social History:  reports that he has never smoked. He has never used smokeless tobacco. He reports current alcohol use. He reports that he does not use drugs.  Allergies:  Allergies  Allergen Reactions  .  Penicillins Swelling    Diffuse swelling    Medications:  Scheduled: . sodium chloride   Intravenous Once  . lactulose  30 g Oral TID  . pantoprazole (PROTONIX) IV  40 mg Intravenous Q12H   Continuous: . sodium chloride 75 mL/hr at 04/20/20 1146  . octreotide  (SANDOSTATIN)    IV infusion 50 mcg/hr (04/20/20 1310)    Results for orders placed or performed during the hospital encounter of 04/19/20 (from the past 24 hour(s))  Ethanol     Status: None   Collection Time: 04/19/20  4:16 PM  Result Value Ref Range   Alcohol, Ethyl (B) <10 <10 mg/dL  Protime-INR     Status: Abnormal   Collection Time: 04/19/20  4:16 PM  Result Value Ref Range   Prothrombin Time 20.5 (H) 11.4 - 15.2 seconds   INR 1.8 (H) 0.8 - 1.2  APTT     Status: Abnormal   Collection Time: 04/19/20  4:16 PM  Result Value Ref Range   aPTT 41 (H) 24 - 36 seconds  CBC     Status: Abnormal   Collection Time: 04/19/20  4:16 PM   Result Value Ref Range   WBC 13.6 (H) 4.0 - 10.5 K/uL   RBC 1.91 (L) 4.22 - 5.81 MIL/uL   Hemoglobin 6.9 (LL) 13.0 - 17.0 g/dL   HCT 20.4 (L) 39 - 52 %   MCV 106.8 (H) 80.0 - 100.0 fL   MCH 36.1 (H) 26.0 - 34.0 pg   MCHC 33.8 30.0 - 36.0 g/dL   RDW 15.5 11.5 - 15.5 %   Platelets 128 (L) 150 - 400 K/uL   nRBC 0.0 0.0 - 0.2 %  Differential     Status: Abnormal   Collection Time: 04/19/20  4:16 PM  Result Value Ref Range   Neutrophils Relative % 84 %   Neutro Abs 11.4 (H) 1.7 - 7.7 K/uL   Lymphocytes Relative 9 %   Lymphs Abs 1.3 0.7 - 4.0 K/uL   Monocytes Relative 6 %   Monocytes Absolute 0.7 0.1 - 1.0 K/uL   Eosinophils Relative 0 %   Eosinophils Absolute 0.0 0.0 - 0.5 K/uL   Basophils Relative 0 %   Basophils Absolute 0.0 0.0 - 0.1 K/uL   Immature Granulocytes 1 %   Abs Immature Granulocytes 0.10 (H) 0.00 - 0.07 K/uL  Comprehensive metabolic panel     Status: Abnormal   Collection Time: 04/19/20  4:16 PM  Result Value Ref Range   Sodium 141 135 - 145 mmol/L   Potassium 3.7 3.5 - 5.1 mmol/L   Chloride 103 98 - 111 mmol/L   CO2 25 22 - 32 mmol/L   Glucose, Bld 163 (H) 70 - 99 mg/dL   BUN 56 (H) 8 - 23 mg/dL   Creatinine, Ser 1.57 (H) 0.61 - 1.24 mg/dL   Calcium 8.2 (L) 8.9 - 10.3 mg/dL   Total Protein 4.6 (L) 6.5 - 8.1 g/dL   Albumin 2.2 (L) 3.5 - 5.0 g/dL   AST 54 (H) 15 - 41 U/L   ALT 30 0 - 44 U/L   Alkaline Phosphatase 73 38 - 126 U/L   Total Bilirubin 5.0 (H) 0.3 - 1.2 mg/dL   GFR, Estimated 44 (L) >60 mL/min   Anion gap 13 5 - 15  Ammonia     Status: Abnormal   Collection Time: 04/19/20  4:16 PM  Result Value Ref Range   Ammonia 108 (H) 9 - 35 umol/L  Respiratory Panel by RT PCR (Flu A&B, Covid) - Nasopharyngeal Swab     Status: None   Collection Time: 04/19/20  4:16 PM   Specimen: Nasopharyngeal Swab  Result Value Ref Range   SARS Coronavirus 2 by RT PCR NEGATIVE NEGATIVE   Influenza A by PCR NEGATIVE NEGATIVE   Influenza B by PCR NEGATIVE NEGATIVE  Type  and screen [place]     Status: None   Collection Time: 04/19/20  5:57 PM  Result Value Ref Range   ABO/RH(D) O POS    Antibody Screen NEG    Sample Expiration 04/22/2020,2359    Unit Number [id number]    Blood Component Type RBC, LR IRR    Unit division 00    Status of Unit ISSUED,FINAL    Transfusion Status OK TO TRANSFUSE    Crossmatch Result      Compatible Performed at [facility], [place] [address] [place], [place] [address]   Prepare RBC (crossmatch)     Status: None   Collection Time: 04/19/20  5:57 PM  Result Value Ref Range   Order Confirmation      ORDER PROCESSED BY BLOOD BANK Performed at [facility], [place] [address] [place], [place] [address]   POC occult blood, ED     Status: Abnormal   Collection Time: 04/19/20  6:27 PM  Result Value Ref Range   Fecal Occult Bld POSITIVE (A) NEGATIVE  CBC     Status: Abnormal   Collection Time: 04/19/20  8:40 PM  Result Value Ref Range   WBC 11.8 (H) 4.0 - 10.5 K/uL   RBC 1.73 (L) 4.22 - 5.81 MIL/uL   Hemoglobin 6.1 (LL) 13.0 - 17.0 g/dL   HCT 18.7 (L) 39 - 52 %   MCV 108.1 (H) 80.0 - 100.0 fL   MCH 35.3 (H) 26.0 - 34.0 pg   MCHC 32.6 30.0 - 36.0 g/dL   RDW 15.6 (H) 11.5 - 15.5 %   Platelets 92 (L) 150 - 400 K/uL   nRBC 0.0 0.0 - 0.2 %  Urine rapid drug screen (hosp performed)not at [facility]     Status: None   Collection Time: 04/19/20 10:30 PM  Result Value Ref Range   Opiates NONE DETECTED NONE DETECTED   Cocaine NONE DETECTED NONE DETECTED   Benzodiazepines NONE DETECTED NONE DETECTED   Amphetamines NONE DETECTED NONE DETECTED   Tetrahydrocannabinol NONE DETECTED NONE DETECTED   Barbiturates NONE DETECTED NONE DETECTED  Urinalysis, Routine w reflex microscopic     Status: None   Collection Time: 04/19/20 10:30 PM  Result Value Ref Range   Color, Urine YELLOW YELLOW   APPearance CLEAR CLEAR   Specific Gravity, Urine 1.015 1.005 - 1.030   pH 5.5 5.0 - 8.0    Glucose, UA NEGATIVE NEGATIVE mg/dL   Hgb urine dipstick NEGATIVE NEGATIVE   Bilirubin Urine NEGATIVE NEGATIVE   Ketones, ur NEGATIVE NEGATIVE mg/dL   Protein, ur NEGATIVE NEGATIVE mg/dL   Nitrite NEGATIVE NEGATIVE   Leukocytes,Ua NEGATIVE NEGATIVE  Comprehensive metabolic panel     Status: Abnormal   Collection Time: 04/20/20  1:09 AM  Result Value Ref Range   Sodium 138 135 - 145 mmol/L   Potassium 3.9 3.5 - 5.1 mmol/L   Chloride 98 98 - 111 mmol/L   CO2 28 22 - 32 mmol/L   Glucose, Bld 141 (H) 70 - 99 mg/dL   BUN 66 (H) 8 - 23 mg/dL  Creatinine, Ser 1.87 (H) 0.61 - 1.24 mg/dL   Calcium 8.5 (L) 8.9 - 10.3 mg/dL   Total Protein 4.7 (L) 6.5 - 8.1 g/dL   Albumin 2.3 (L) 3.5 - 5.0 g/dL   AST 53 (H) 15 - 41 U/L   ALT 29 0 - 44 U/L   Alkaline Phosphatase 70 38 - 126 U/L   Total Bilirubin 5.0 (H) 0.3 - 1.2 mg/dL   GFR, Estimated 35 (L) >60 mL/min   Anion gap 12 5 - 15  CBC     Status: Abnormal   Collection Time: 04/20/20  1:09 AM  Result Value Ref Range   WBC 11.7 (H) 4.0 - 10.5 K/uL   RBC 2.04 (L) 4.22 - 5.81 MIL/uL   Hemoglobin 7.3 (L) 13.0 - 17.0 g/dL   HCT 21.7 (L) 39 - 52 %   MCV 106.4 (H) 80.0 - 100.0 fL   MCH 35.8 (H) 26.0 - 34.0 pg   MCHC 33.6 30.0 - 36.0 g/dL   RDW 15.5 11.5 - 15.5 %   Platelets 85 (L) 150 - 400 K/uL   nRBC 0.2 0.0 - 0.2 %  CBC     Status: Abnormal   Collection Time: 04/20/20  6:22 AM  Result Value Ref Range   WBC 9.6 4.0 - 10.5 K/uL   RBC 1.94 (L) 4.22 - 5.81 MIL/uL   Hemoglobin 7.0 (L) 13.0 - 17.0 g/dL   HCT 20.3 (L) 39 - 52 %   MCV 104.6 (H) 80.0 - 100.0 fL   MCH 36.1 (H) 26.0 - 34.0 pg   MCHC 34.5 30.0 - 36.0 g/dL   RDW 15.6 (H) 11.5 - 15.5 %   Platelets 77 (L) 150 - 400 K/uL   nRBC 0.0 0.0 - 0.2 %  Ammonia     Status: Abnormal   Collection Time: 04/20/20  8:10 AM  Result Value Ref Range   Ammonia 59 (H) 9 - 35 umol/L     CT HEAD WO CONTRAST  Result Date: 04/19/2020 CLINICAL DATA:  Confusion, fall a few months ago. EXAM: CT  HEAD WITHOUT CONTRAST TECHNIQUE: Contiguous axial images were obtained from the base of the skull through the vertex without intravenous contrast. COMPARISON:  None. FINDINGS: Brain: Mild chronic small vessel disease within the deep white matter. No acute intracranial abnormality. Specifically, no hemorrhage, hydrocephalus, mass lesion, acute infarction, or significant intracranial injury. Vascular: No hyperdense vessel or unexpected calcification. Skull: No acute calvarial abnormality. Sinuses/Orbits: Visualized paranasal sinuses and mastoids clear. Orbital soft tissues unremarkable. Other: None IMPRESSION: Mild chronic small vessel disease throughout the deep white matter. No acute intracranial abnormality. Electronically Signed   By: Rolm Baptise M.D.   On: 04/19/2020 17:51   DG Foot Complete Right  Result Date: 04/19/2020 CLINICAL DATA:  Weakness and foot bruising EXAM: RIGHT FOOT COMPLETE - 3+ VIEW COMPARISON:  None. FINDINGS: Generalized soft tissue swelling is noted in the foot and ankle. No acute fracture or dislocation is noted. Calcaneal spurring is seen. IMPRESSION: Soft tissue swelling without acute bony abnormality. Electronically Signed   By: Inez Catalina M.D.   On: 04/19/2020 17:12    ROS:  As stated above in the HPI otherwise negative.  Blood pressure 108/72, pulse 95, temperature 98.5 F (36.9 C), temperature source Oral, resp. rate 18, height 5\' 11"  (1.803 m), weight 107.5 kg, SpO2 99 %.    PE: Gen: NAD, Alert and Oriented HEENT:  [place]/AT, EOMI Neck: Supple, no LAD Lungs: CTA Bilaterally CV: RRR without  M/G/R ABD: Soft, NTND, +BS Ext: No C/C/E  Assessment/Plan: 1) Melena. 2) Anemia. 3) Hepatic encephalopathy. 4) Decompensated ETOH cirrhosis.   The patient requires further evaluation with an EGD.  The suspicion is that he may have a slow variceal bleed.  No prior EGD was ever performed.  His last colonoscopy was with Dr. [name] in 2018 with findings of some polyps.  There  also no recent imaging.  The last abdominal CT scan was on 10/15/2014 and his liver showed steatosis, which was also the reading for the ultrasound on 05/20/2017.  Currently he is stable, but very slow with mentation.  There is no hemodynamic compromise.  Plan: 1) EGD tomorrow with possible banding. 2) Agree with ceftriaxone. 3) Start octreotide. 4) Continue with lactulose. 5) Start Xifaxan 550 mg BID. 6) Follow HGB, but do not transfuse above 10 g/dL.  Tekeya Geffert D 04/20/2020, 12:13 PM

## 2020-04-20 NOTE — Anesthesia Preprocedure Evaluation (Addendum)
Anesthesia Evaluation  Patient identified by MRN, date of birth, ID band Patient awake    Reviewed: Allergy & Precautions, NPO status , Patient's Chart, lab work & pertinent test results, reviewed documented beta blocker date and time   Airway Mallampati: III  TM Distance: >3 FB Neck ROM: Full    Dental no notable dental hx. (+) Teeth Intact, Dental Advisory Given   Pulmonary    Pulmonary exam normal breath sounds clear to auscultation       Cardiovascular hypertension, Pt. on medications and Pt. on home beta blockers +CHF (grade 1 diastolic dysfunction)  Normal cardiovascular exam Rhythm:Regular Rate:Normal  Echo 2016: Mild LVH with LVEF 66-59%, grade 1 diastolic dysfunction. Mildleft atrial enlargement. Trivial tricuspid regurgitation, unable to assess PASP.     Neuro/Psych PSYCHIATRIC DISORDERS Anxiety negative neurological ROS     GI/Hepatic GERD  Medicated and Controlled,(+) Cirrhosis     substance abuse  alcohol use, Chronic thrombocytopenia  presented to the emergency room with complaints of confusion, increased jaundice, fall. Elevated ammonia, acutely encephalopathic and GIB w/ dec H/H s/p 1 unit prbc- concern for variceal bleed   Endo/Other  Obesity BMI 33  Renal/GU ARFRenal diseaseCr 1.87, no hx CKD  negative genitourinary   Musculoskeletal  (+) Arthritis , Osteoarthritis,    Abdominal   Peds  Hematology  (+) Blood dyscrasia, anemia ,  plt 77, Hb 6.8 this AM- getting 1  Unit now   Anesthesia Other Findings Acutely encephalopathic, confused on exam  Reproductive/Obstetrics negative OB ROS                        Anesthesia Physical Anesthesia Plan  ASA: IV  Anesthesia Plan: General   Post-op Pain Management:    Induction: Intravenous, Rapid sequence and Cricoid pressure planned  PONV Risk Score and Plan: Ondansetron, Dexamethasone and Treatment may vary due to age or  medical condition  Airway Management Planned: Oral ETT  Additional Equipment: None  Intra-op Plan:   Post-operative Plan: Extubation in OR  Informed Consent: I have reviewed the patients History and Physical, chart, labs and discussed the procedure including the risks, benefits and alternatives for the proposed anesthesia with the patient or authorized representative who has indicated his/her understanding and acceptance.     Dental advisory given and Consent reviewed with POA  Plan Discussed with: CRNA  Anesthesia Plan Comments: (Called wife Massimiliano Rohleder for consent- normally patient is AOx3, confusion is an acute event with this admission per wife. )      Anesthesia Quick Evaluation

## 2020-04-20 NOTE — Progress Notes (Signed)
PROGRESS NOTE    Timothy Mckinney  GGE:[id number] DOB: 15-Feb-1951 DOA: 04/19/2020 PCP: [name], MD   Chief Complain: Confusion, jaundice  Brief Narrative: Patient is a 69 year old male with history of hypertension, degenerative disc disease, nephrolithiasis, thrombocytopenia, GERD, liver cirrhosis who presents to the emergency room with complaints of confusion, increased jaundice, fall.  Patient was found to be taking presentation, confused.  FOBT was positive, he was anemic on presentation.  Ammonia was found to be elevated, T bili of 5.  Patient was admitted for the management of acute encephalopathy, GI bleed.  GI consulted and following  Assessment & Plan:   Principal Problem:   Acute hepatic encephalopathy Active Problems:   GERD (gastroesophageal reflux disease)   Hypertension   Sarcoidosis ([place])   Generalized anxiety disorder   Alcoholic liver disease ([place])   Acute hepatic encephalopathy: Ammonia was severely elevated on presentation.  Patient was confused.  Not taking lactulose at home.  Since there is concern for GI bleed, he has been kept n.p.o.. Ammonia level has improved today.  Continue lactulose  Suspected GI bleed: Hemoglobin in the range of 6 on presentation.  Status post transfusion with a unit of  PRBC.  Hemoglobin is 7 today.  GI consulted.  On IV Protonix, continue to monitor CBC.  Also started on octreotide for the concern  of variceal bleed. GI bleed could have been associated with esophageal/gastric varices.  Plan for EGD tomorow.  We will continue to monitor hemoglobin.  AKI: No history of CKD.  Currently he looks intravascularly volume depleted.  Creatinine worsened after he was given a dose of IV Lasix.  Will initiate gentle IV fluids recheck BMP tomorrow. Low suspicion for hepatorenal syndrome.Will check urine creatine ,urine sodium,renal US  Severe protein calorie malnutrition: Albumin in the range of 2.  Will request for dietitian  evaluation  Liver cirrhosis: H/O alcoholic liver disease. Last drink was about 3 months ago. Has thrombocytopenia, elevated INR, icterus. We will check a follow up liver US  Pancytopenia: Associated with alcoholic liver disease.  Follows with hematology.  Hypertension: Antihypertensives on hold.  Monitor blood pressure  History of sarcoidosis: Currently stable  GERD: On Protonix         DVT prophylaxis:SCD Code Status:Full  Family Communication: Wife on phone on 04/20/20 Status is: Inpatient  Remains inpatient appropriate because:Inpatient level of care appropriate due to severity of illness   Dispo: The patient is from: Home              Anticipated d/c is to: Home              Anticipated d/c date is: 3 days              Patient currently is not medically stable to d/c.    Consultants: GI  Procedures:None  Antimicrobials:  Anti-infectives (From admission, onward)   Start     Dose/Rate Route Frequency Ordered Stop   04/19/20 1800  cefTRIAXone (ROCEPHIN) 1 g in sodium chloride 0.9 % 100 mL IVPB        1 g 200 mL/hr over 30 Minutes Intravenous  Once 04/19/20 1752 04/19/20 1921      Subjective: Patient seen and examined at the bedside this morning.  Hemodynamically stable during my evaluation.  Confused.  Was not in any kind of significant distress.  Did not provide any more information. Objective: Vitals:   04/19/20 2330 04/20/20 0032 04/20/20 0118 04/20/20 0649  BP: (!) 146/46 (!) 170/125 Marland Kitchen)  152/97 (!) 115/56  Pulse: 87 (!) 109 (!) 109 (!) 109  Resp: 15 20 20 16   Temp:  98.1 F (36.7 C) 99.1 F (37.3 C) 98.5 F (36.9 C)  TempSrc:    Oral  SpO2: 100% 100% 99% 99%  Weight:      Height:        Intake/Output Summary (Last 24 hours) at 04/20/2020 0805 Last data filed at 04/20/2020 9924 Gross per 24 hour  Intake 315 ml  Output 300 ml  Net 15 ml   Filed Weights   04/19/20 1530 04/19/20 1540  Weight: 107.5 kg 107.5 kg    Examination:  General  exam: Obese, not in distress HEENT:PERRL,Oral mucosa moist, Ear/Nose normal on gross exam Respiratory system: Bilateral equal air entry, normal vesicular breath sounds, no wheezes or crackles  Cardiovascular system: S1 & S2 heard, RRR. No JVD, murmurs, rubs, gallops or clicks.  Gastrointestinal system: Abdomen isdistended, soft and nontender. No organomegaly or masses felt. Normal bowel sounds heard. Central nervous system: Alert and awake but not oriented. No focal neurological deficits. Extremities: 1-2+ pitting bilateral lower extremity edema, no clubbing ,no cyanosis, Skin: Icterus, no ulcers     Data Reviewed: I have personally reviewed following labs and imaging studies  CBC: Recent Labs  Lab 04/19/20 1616 04/19/20 2040 04/20/20 0109 04/20/20 0622  WBC 13.6* 11.8* 11.7* 9.6  NEUTROABS 11.4*  --   --   --   HGB 6.9* 6.1* 7.3* 7.0*  HCT 20.4* 18.7* 21.7* 20.3*  MCV 106.8* 108.1* 106.4* 104.6*  PLT 128* 92* 85* 77*   Basic Metabolic Panel: Recent Labs  Lab 04/19/20 1616 04/20/20 0109  NA 141 138  K 3.7 3.9  CL 103 98  CO2 25 28  GLUCOSE 163* 141*  BUN 56* 66*  CREATININE 1.57* 1.87*  CALCIUM 8.2* 8.5*   GFR: Estimated Creatinine Clearance: 45.2 mL/min (A) (by C-G formula based on SCr of 1.87 mg/dL (H)). Liver Function Tests: Recent Labs  Lab 04/19/20 1616 04/20/20 0109  AST 54* 53*  ALT 30 29  ALKPHOS 73 70  BILITOT 5.0* 5.0*  PROT 4.6* 4.7*  ALBUMIN 2.2* 2.3*   No results for input(s): LIPASE, AMYLASE in the last 168 hours. Recent Labs  Lab 04/19/20 1616  AMMONIA 108*   Coagulation Profile: Recent Labs  Lab 04/19/20 1616  INR 1.8*   Cardiac Enzymes: No results for input(s): CKTOTAL, CKMB, CKMBINDEX, TROPONINI in the last 168 hours. BNP (last 3 results) No results for input(s): PROBNP in the last 8760 hours. HbA1C: No results for input(s): HGBA1C in the last 72 hours. CBG: No results for input(s): GLUCAP in the last 168 hours. Lipid  Profile: No results for input(s): CHOL, HDL, LDLCALC, TRIG, CHOLHDL, LDLDIRECT in the last 72 hours. Thyroid Function Tests: No results for input(s): TSH, T4TOTAL, FREET4, T3FREE, THYROIDAB in the last 72 hours. Anemia Panel: No results for input(s): VITAMINB12, FOLATE, FERRITIN, TIBC, IRON, RETICCTPCT in the last 72 hours. Sepsis Labs: No results for input(s): PROCALCITON, LATICACIDVEN in the last 168 hours.  Recent Results (from the past 240 hour(s))  Respiratory Panel by RT PCR (Flu A&B, Covid) - Nasopharyngeal Swab     Status: None   Collection Time: 04/19/20  4:16 PM   Specimen: Nasopharyngeal Swab  Result Value Ref Range Status   SARS Coronavirus 2 by RT PCR NEGATIVE NEGATIVE Final    Comment: (NOTE) SARS-CoV-2 target nucleic acids are NOT DETECTED.  The SARS-CoV-2 RNA is generally detectable in upper  respiratoy specimens during the acute phase of infection. The lowest concentration of SARS-CoV-2 viral copies this assay can detect is 131 copies/mL. A negative result does not preclude SARS-Cov-2 infection and should not be used as the sole basis for treatment or other patient management decisions. A negative result may occur with  improper specimen collection/handling, submission of specimen other than nasopharyngeal swab, presence of viral mutation(s) within the areas targeted by this assay, and inadequate number of viral copies (<131 copies/mL). A negative result must be combined with clinical observations, patient history, and epidemiological information. The expected result is Negative.  Fact Sheet for Patients:  PinkCheek.be  Fact Sheet for Healthcare Providers:  GravelBags.it  This test is no t yet approved or cleared by the [place] FDA and  has been authorized for detection and/or diagnosis of SARS-CoV-2 by FDA under an Emergency Use Authorization (EUA). This EUA will remain  in effect (meaning this  test can be used) for the duration of the COVID-19 declaration under Section 564(b)(1) of the Act, 21 U.S.C. section 360bbb-3(b)(1), unless the authorization is terminated or revoked sooner.     Influenza A by PCR NEGATIVE NEGATIVE Final   Influenza B by PCR NEGATIVE NEGATIVE Final    Comment: (NOTE) The Xpert Xpress SARS-CoV-2/FLU/RSV assay is intended as an aid in  the diagnosis of influenza from Nasopharyngeal swab specimens and  should not be used as a sole basis for treatment. Nasal washings and  aspirates are unacceptable for Xpert Xpress SARS-CoV-2/FLU/RSV  testing.  Fact Sheet for Patients: PinkCheek.be  Fact Sheet for Healthcare Providers: GravelBags.it  This test is not yet approved or cleared by the [place] FDA and  has been authorized for detection and/or diagnosis of SARS-CoV-2 by  FDA under an Emergency Use Authorization (EUA). This EUA will remain  in effect (meaning this test can be used) for the duration of the  Covid-19 declaration under Section 564(b)(1) of the Act, 21  U.S.C. section 360bbb-3(b)(1), unless the authorization is  terminated or revoked. Performed at [facility], [place] [address] [place], [place] [address]          Radiology Studies: CT HEAD WO CONTRAST  Result Date: 04/19/2020 CLINICAL DATA:  Confusion, fall a few months ago. EXAM: CT HEAD WITHOUT CONTRAST TECHNIQUE: Contiguous axial images were obtained from the base of the skull through the vertex without intravenous contrast. COMPARISON:  None. FINDINGS: Brain: Mild chronic small vessel disease within the deep white matter. No acute intracranial abnormality. Specifically, no hemorrhage, hydrocephalus, mass lesion, acute infarction, or significant intracranial injury. Vascular: No hyperdense vessel or unexpected calcification. Skull: No acute calvarial abnormality. Sinuses/Orbits: Visualized paranasal sinuses  and mastoids clear. Orbital soft tissues unremarkable. Other: None IMPRESSION: Mild chronic small vessel disease throughout the deep white matter. No acute intracranial abnormality. Electronically Signed   By: Rolm Baptise M.D.   On: 04/19/2020 17:51   DG Foot Complete Right  Result Date: 04/19/2020 CLINICAL DATA:  Weakness and foot bruising EXAM: RIGHT FOOT COMPLETE - 3+ VIEW COMPARISON:  None. FINDINGS: Generalized soft tissue swelling is noted in the foot and ankle. No acute fracture or dislocation is noted. Calcaneal spurring is seen. IMPRESSION: Soft tissue swelling without acute bony abnormality. Electronically Signed   By: Inez Catalina M.D.   On: 04/19/2020 17:12        Scheduled Meds: . sodium chloride   Intravenous Once  . lactulose  300 mL Rectal BID  . pantoprazole (PROTONIX) IV  40 mg  Intravenous Q12H   Continuous Infusions:   LOS: 1 day    Time spent:35 mins. More than 50% of that time was spent in counseling and/or coordination of care.      Shelly Coss, MD [facility] P10/21/2021, 8:05 AM

## 2020-04-21 ENCOUNTER — Inpatient Hospital Stay (HOSPITAL_COMMUNITY): Payer: PPO | Admitting: Anesthesiology

## 2020-04-21 ENCOUNTER — Inpatient Hospital Stay (HOSPITAL_COMMUNITY): Payer: PPO

## 2020-04-21 ENCOUNTER — Telehealth: Payer: PPO

## 2020-04-21 ENCOUNTER — Inpatient Hospital Stay (HOSPITAL_COMMUNITY)
Admit: 2020-04-21 | Discharge: 2020-04-21 | Disposition: A | Discharge status: Home / Self Care | Payer: PPO | Attending: Internal Medicine | Admitting: Internal Medicine

## 2020-04-21 ENCOUNTER — Encounter (HOSPITAL_COMMUNITY)
Admission: EM | Disposition: A | Discharge status: Skilled Nursing Facility | Payer: Self-pay | Source: Home / Self Care | Attending: Internal Medicine

## 2020-04-21 DIAGNOSIS — R42 Dizziness and giddiness: Secondary | ICD-10-CM | POA: Diagnosis not present

## 2020-04-21 DIAGNOSIS — K269 Duodenal ulcer, unspecified as acute or chronic, without hemorrhage or perforation: Secondary | ICD-10-CM | POA: Diagnosis not present

## 2020-04-21 DIAGNOSIS — K7200 Acute and subacute hepatic failure without coma: Secondary | ICD-10-CM | POA: Diagnosis not present

## 2020-04-21 DIAGNOSIS — K766 Portal hypertension: Secondary | ICD-10-CM | POA: Diagnosis not present

## 2020-04-21 DIAGNOSIS — K31819 Angiodysplasia of stomach and duodenum without bleeding: Secondary | ICD-10-CM | POA: Diagnosis not present

## 2020-04-21 HISTORY — PX: ESOPHAGOGASTRODUODENOSCOPY (EGD) WITH PROPOFOL: SHX5813

## 2020-04-21 HISTORY — PX: BIOPSY: SHX5522

## 2020-04-21 LAB — CBC WITH DIFFERENTIAL/PLATELET
Abs Immature Granulocytes: 0.11 10*3/uL — ABNORMAL HIGH (ref 0.00–0.07)
Basophils Absolute: 0 10*3/uL (ref 0.0–0.1)
Basophils Relative: 0 %
Eosinophils Absolute: 0 10*3/uL (ref 0.0–0.5)
Eosinophils Relative: 0 %
HCT: 19.9 % — ABNORMAL LOW (ref 39.0–52.0)
Hemoglobin: 6.8 g/dL — CL (ref 13.0–17.0)
Immature Granulocytes: 1 %
Lymphocytes Relative: 14 %
Lymphs Abs: 1.5 10*3/uL (ref 0.7–4.0)
MCH: 36.4 pg — ABNORMAL HIGH (ref 26.0–34.0)
MCHC: 34.2 g/dL (ref 30.0–36.0)
MCV: 106.4 fL — ABNORMAL HIGH (ref 80.0–100.0)
Monocytes Absolute: 0.8 10*3/uL (ref 0.1–1.0)
Monocytes Relative: 7 %
Neutro Abs: 8.5 10*3/uL — ABNORMAL HIGH (ref 1.7–7.7)
Neutrophils Relative %: 78 %
Platelets: 88 10*3/uL — ABNORMAL LOW (ref 150–400)
RBC: 1.87 MIL/uL — ABNORMAL LOW (ref 4.22–5.81)
RDW: 17.2 % — ABNORMAL HIGH (ref 11.5–15.5)
WBC: 10.9 10*3/uL — ABNORMAL HIGH (ref 4.0–10.5)
nRBC: 0.3 % — ABNORMAL HIGH (ref 0.0–0.2)

## 2020-04-21 LAB — BASIC METABOLIC PANEL
Anion gap: 10 (ref 5–15)
BUN: 72 mg/dL — ABNORMAL HIGH (ref 8–23)
CO2: 27 mmol/L (ref 22–32)
Calcium: 8.2 mg/dL — ABNORMAL LOW (ref 8.9–10.3)
Chloride: 102 mmol/L (ref 98–111)
Creatinine, Ser: 1.91 mg/dL — ABNORMAL HIGH (ref 0.61–1.24)
GFR, Estimated: 37 mL/min — ABNORMAL LOW (ref >60)
Glucose, Bld: 136 mg/dL — ABNORMAL HIGH (ref 70–99)
Potassium: 3.6 mmol/L (ref 3.5–5.1)
Sodium: 139 mmol/L (ref 135–145)

## 2020-04-21 LAB — TSH: TSH: 0.226 u[IU]/mL — ABNORMAL LOW (ref 0.350–4.500)

## 2020-04-21 LAB — PREPARE RBC (CROSSMATCH)

## 2020-04-21 LAB — AMMONIA: Ammonia: 32 umol/L (ref 9–35)

## 2020-04-21 LAB — VITAMIN B12: Vitamin B-12: 1354 pg/mL — ABNORMAL HIGH (ref 180–914)

## 2020-04-21 LAB — FOLATE: Folate: 6.9 ng/mL (ref >5.9)

## 2020-04-21 SURGERY — ESOPHAGOGASTRODUODENOSCOPY (EGD) WITH PROPOFOL
Anesthesia: General

## 2020-04-21 MED ORDER — PROPOFOL 10 MG/ML IV BOLUS
INTRAVENOUS | Status: AC
  Filled 2020-04-21: qty 20

## 2020-04-21 MED ORDER — DEXAMETHASONE SODIUM PHOSPHATE 10 MG/ML IJ SOLN
INTRAMUSCULAR | Status: DC | PRN
  Administered 2020-04-21: 4 mg via INTRAVENOUS

## 2020-04-21 MED ORDER — FENTANYL CITRATE (PF) 100 MCG/2ML IJ SOLN
INTRAMUSCULAR | Status: DC | PRN
  Administered 2020-04-21: 50 ug via INTRAVENOUS

## 2020-04-21 MED ORDER — PANTOPRAZOLE SODIUM 40 MG PO TBEC
40.0000 mg | DELAYED_RELEASE_TABLET | Freq: Two times a day (BID) | ORAL | Status: DC
  Administered 2020-04-21 – 2020-04-25 (×7): 40 mg via ORAL
  Filled 2020-04-21 (×10): qty 1

## 2020-04-21 MED ORDER — FUROSEMIDE 10 MG/ML IJ SOLN
80.0000 mg | Freq: Once | INTRAMUSCULAR | Status: AC
  Administered 2020-04-21: 80 mg via INTRAVENOUS
  Filled 2020-04-21: qty 8

## 2020-04-21 MED ORDER — SUCRALFATE 1 G PO TABS
1.0000 g | ORAL_TABLET | Freq: Three times a day (TID) | ORAL | Status: DC
  Administered 2020-04-21 – 2020-04-25 (×16): 1 g via ORAL
  Filled 2020-04-21 (×21): qty 1

## 2020-04-21 MED ORDER — LACTULOSE 10 GM/15ML PO SOLN
10.0000 g | Freq: Three times a day (TID) | ORAL | Status: DC
  Administered 2020-04-21 – 2020-04-25 (×10): 10 g via ORAL
  Filled 2020-04-21 (×12): qty 15

## 2020-04-21 MED ORDER — ALBUMIN HUMAN 25 % IV SOLN
25.0000 g | Freq: Four times a day (QID) | INTRAVENOUS | Status: AC
  Administered 2020-04-21 (×3): 25 g via INTRAVENOUS
  Filled 2020-04-21 (×3): qty 100

## 2020-04-21 MED ORDER — LORAZEPAM BOLUS VIA INFUSION: 1.0000 mg | Freq: Once | INTRAVENOUS | Status: DC

## 2020-04-21 MED ORDER — LORAZEPAM 2 MG/ML IJ SOLN
1.0000 mg | Freq: Once | INTRAMUSCULAR | Status: AC
  Administered 2020-04-22: 1 mg via INTRAVENOUS
  Filled 2020-04-21: qty 1

## 2020-04-21 MED ORDER — SODIUM CHLORIDE 0.9% IV SOLUTION: Freq: Once | INTRAVENOUS | Status: AC

## 2020-04-21 MED ORDER — SPIRONOLACTONE 25 MG PO TABS
50.0000 mg | ORAL_TABLET | Freq: Every day | ORAL | Status: DC
  Administered 2020-04-21 – 2020-04-25 (×5): 50 mg via ORAL
  Filled 2020-04-21 (×6): qty 2

## 2020-04-21 MED ORDER — PHENYLEPHRINE 40 MCG/ML (10ML) SYRINGE FOR IV PUSH (FOR BLOOD PRESSURE SUPPORT)
PREFILLED_SYRINGE | INTRAVENOUS | Status: DC | PRN
  Administered 2020-04-21: 40 ug via INTRAVENOUS

## 2020-04-21 MED ORDER — FENTANYL CITRATE (PF) 100 MCG/2ML IJ SOLN
INTRAMUSCULAR | Status: AC
  Filled 2020-04-21: qty 2

## 2020-04-21 MED ORDER — LIDOCAINE 2% (20 MG/ML) 5 ML SYRINGE
INTRAMUSCULAR | Status: DC | PRN
  Administered 2020-04-21: 40 mg via INTRAVENOUS

## 2020-04-21 MED ORDER — SUCRALFATE 1 G PO TABS: 1.0000 g | ORAL_TABLET | Freq: Three times a day (TID) | ORAL | Status: DC

## 2020-04-21 MED ORDER — SUCCINYLCHOLINE CHLORIDE 200 MG/10ML IV SOSY
PREFILLED_SYRINGE | INTRAVENOUS | Status: DC | PRN
  Administered 2020-04-21: 120 mg via INTRAVENOUS

## 2020-04-21 MED ORDER — PROPOFOL 10 MG/ML IV BOLUS
INTRAVENOUS | Status: DC | PRN
  Administered 2020-04-21: 130 mg via INTRAVENOUS

## 2020-04-21 MED ORDER — ONDANSETRON HCL 4 MG/2ML IJ SOLN
INTRAMUSCULAR | Status: DC | PRN
  Administered 2020-04-21: 4 mg via INTRAVENOUS

## 2020-04-21 SURGICAL SUPPLY — 25 items

## 2020-04-21 NOTE — Progress Notes (Signed)
Initial Nutrition Assessment  DOCUMENTATION CODES:   Obesity unspecified  INTERVENTION:  Monitor for diet advancement -Will provide oral nutrition supplements as appropriate -Will order MVI with minerals daily   NUTRITION DIAGNOSIS:   Inadequate oral intake related to nausea, vomiting, lethargy/confusion as evidenced by per patient/family report (as per H&P).    GOAL:   Patient will meet greater than or equal to 90% of their needs    MONITOR:   PO intake, Diet advancement, Labs, Weight trends, I & O's  REASON FOR ASSESSMENT:   Malnutrition Screening Tool    ASSESSMENT:   RD working remotely.  69 year old male with history of HTN, degenerative disc disease, nephrolithiasis, thrombocytopenia, sleep apnea,GERD, tremors, liver cirrhosis and ongoing weakness after sustaining a fall a few months ago with injury to neck and back presented with intermittent nausea with vomiting over the past few days, progressive confusion and worsening tremors after another fall at home. Pt noted markedly juandice, decreased hemoglobin with guaiac positive stools and admitted with hepatic encephalopathy and GIB.  Patient is s/p upper endoscopy this morning for suspected slow variceal bleed, results pending. Per notes pt currently undergoing EEG, unable to contact via phone at this time. Will plan to obtain history and complete exam at follow-up. Ammonia severely elevated on presentation, not taking lactulose at home, levels improved today, with noted persistent confusion today, possibly secondary to Wernicke's encephalopathy from chronic alcohol abuse. Labs ordered and pending.   Pt is NPO at this time, will monitor for diet advancement and order nutrition supplements as appropriate.   Current wt: 107.5 kg (236.5 lb) Actual wt masked by fluid, noted +3 BLE, +1 BUE; generalized edema.  Per chart, weights appear stable over the past year and question if admit wt was stated given pt was same weight  on 02/02/20.  Medications reviewed and include: Lactulose, Rifaximin, Carafate, Albumin human 25% IV every 6 hours  Labs: BUN 72 (H), Cr 1.91 (H), WBC 10.9 (H), Hgb 6.8 (L) HCT 19.9 (L)  NUTRITION - FOCUSED PHYSICAL EXAM: Unable to complete at this time, RD working remotely.   Diet Order:   Diet Order            Diet regular Room service appropriate? Yes; Fluid consistency: Thin  Diet effective now                 EDUCATION NEEDS:   Not appropriate for education at this time  Skin:  Skin Assessment: Skin Integrity Issues: Skin Integrity Issues:: Other (Comment) Other: petechiae;L arm; ecchymosis; bilateral abd; buttocks;hip;neck;foot  Last BM:  10/22- type 7  Height:   Ht Readings from Last 1 Encounters:  04/19/20 5\' 11"  (1.803 m)    Weight:   Wt Readings from Last 1 Encounters:  04/19/20 107.5 kg   BMI:  Body mass index is 33.05 kg/m.  Estimated Nutritional Needs:   Kcal:  7543-6067  Protein:  100-110  Fluid:  >/= 2 L/day   Lajuan Lines, RD, LDN Clinical Nutrition After Hours/Weekend Pager # in [place]

## 2020-04-21 NOTE — Progress Notes (Signed)
EEG completed, results pending. 

## 2020-04-21 NOTE — Anesthesia Postprocedure Evaluation (Signed)
Anesthesia Post Note  Patient: Timothy Mckinney  Procedure(s) Performed: ESOPHAGOGASTRODUODENOSCOPY (EGD) WITH PROPOFOL (N/A ) BIOPSY     Patient location during evaluation: PACU Anesthesia Type: General Level of consciousness: awake and alert, oriented and patient cooperative Pain management: pain level controlled Vital Signs Assessment: post-procedure vital signs reviewed and stable Respiratory status: spontaneous breathing, nonlabored ventilation and respiratory function stable Cardiovascular status: blood pressure returned to baseline and stable Postop Assessment: no apparent nausea or vomiting Anesthetic complications: no   No complications documented.  Last Vitals:  Vitals:   04/21/20 0950 04/21/20 1014  BP: (!) 150/49 (!) 138/58  Pulse: (!) 111 (!) 112  Resp: 16 16  Temp:  36.6 C  SpO2: 99% 96%    Last Pain:  Vitals:   04/21/20 1014  TempSrc: Oral  PainSc:                  Pervis Hocking

## 2020-04-21 NOTE — Progress Notes (Addendum)
CRITICAL VALUE ALERT  Critical Value:  Hgb: 66.8  Date & Time Notied:  04/21/20 @0510   Provider Notified: Ronalee Belts  Orders Received/Actions taken: 1 unit RBC

## 2020-04-21 NOTE — Progress Notes (Signed)
PT Cancellation Note  Patient Details Name: Timothy Mckinney MRN: [id number] DOB: 12/03/1950   Cancelled Treatment:    Reason Eval/Treat Not Completed: Patient at procedure or test/unavailable (in endo)   [facility] 04/21/2020, 9:41 AM

## 2020-04-21 NOTE — Progress Notes (Signed)
OT Cancellation Note  Patient Details Name: Timothy Mckinney MRN: [id number] DOB: [birth year]   Cancelled Treatment:    Reason Eval/Treat Not Completed: Patient at procedure or test/ unavailable. Patient at endoscopy this AM and per chart review how hemoglobin. Will check back a schedule permits.  Delbert Phenix OT OT pager: [place] 04/21/2020, 10:57 AM

## 2020-04-21 NOTE — Progress Notes (Signed)
   04/21/20 1014  Assess: MEWS Score  Temp 97.9 F (36.6 C)  BP (!) 138/58  Pulse Rate (!) 112  Resp 16  Level of Consciousness Alert  SpO2 96 %  O2 Device Room Air  Assess: MEWS Score  MEWS Temp 0  MEWS Systolic 0  MEWS Pulse 2  MEWS RR 0  MEWS LOC 0  MEWS Score 2  MEWS Score Color Yellow  Assess: if the MEWS score is Yellow or Red  Were vital signs taken at a resting state? Yes  Focused Assessment Change from prior assessment (see assessment flowsheet)  Early Detection of Sepsis Score *See Row Information* High  MEWS guidelines implemented *See Row Information* Yes  Take Vital Signs  Increase Vital Sign Frequency  Yellow: Q 2hr X 2 then Q 4hr X 2, if remains yellow, continue Q 4hrs  Escalate  MEWS: Escalate Yellow: discuss with charge nurse/RN and consider discussing with provider and RRT  Notify: Charge Nurse/RN  Name of Charge Nurse/RN Notified Gershon Cull, RN  Date Charge Nurse/RN Notified 04/21/20  Time Charge Nurse/RN Notified 1140

## 2020-04-21 NOTE — Procedures (Signed)
Patient Name: Timothy Mckinney  MRN: [id number]  Epilepsy Attending: Lora Havens  Referring Physician/Provider: Dr Shelly Coss Date: 04/21/2020 Duration: 22.40 mins  Patient history: 69 year old male with altered mental status.  EEG evaluate for seizure.   Level of alertness: Awake  AEDs during EEG study: None  Technical aspects: This EEG study was done with scalp electrodes positioned according to the 10-20 International system of electrode placement. Electrical activity was acquired at a sampling rate of 500Hz  and reviewed with a high frequency filter of 70Hz  and a low frequency filter of 1Hz . EEG data were recorded continuously and digitally stored.   Description: No posterior dominant rhythm seen.  EEG showed continuous generalized 5 to 6 Hz theta slowing as well as intermittent rhythmicgeneralized 2 to 3 Hz delta slowing which at times appeared sharply contoured hyperventilation and photic stimulation were not performed.     ABNORMALITY - Continuous slow, generalized - Intermittent rhythmic delta slow, generalized  IMPRESSION: This study is suggestive of moderate diffuse encephalopathy, nonspecific etiology. No seizures or epileptiform discharges were seen throughout the recording.  Alyssa Mancera Barbra Sarks

## 2020-04-21 NOTE — Progress Notes (Addendum)
PROGRESS NOTE    Timothy Mckinney  FTD:[id number] DOB: 02-04-1951 DOA: 04/19/2020 PCP: [name], MD   Chief Complain: Confusion, jaundice  Brief Narrative: Patient is a 69 year old male with history of hypertension, degenerative disc disease, nephrolithiasis, thrombocytopenia, GERD, liver cirrhosis who presents to the emergency room with complaints of confusion, increased jaundice, fall.  Patient was found to be taking presentation, confused.  FOBT was positive, he was anemic on presentation.  Ammonia was found to be elevated, T bili of 5.  Patient was admitted for the management of acute encephalopathy, GI bleed.  GI consulted and he underwent EGD today.  Assessment & Plan:   Principal Problem:   Acute hepatic encephalopathy Active Problems:   GERD (gastroesophageal reflux disease)   Hypertension   Sarcoidosis ([place])   Generalized anxiety disorder   Alcoholic liver disease ([place])   Acute  encephalopathy: Hepatic encephalopathy suspected on presentation.  Ammonia was severely elevated on presentation.  Patient was confused.  Not taking lactulose at home. . Ammonia level has improved now.  Continue lactulose, rifaximin.  He was still confused this morning.  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 Will check TSH, vitamin B1. We will also do MRI of the brain and EEG.  Suspected GI bleed/macrocytic anemia: Hemoglobin in the range of 6 on presentation.  Status post transfusion with 2 units of  PRBC. GI consulted,underwent EGD.  On IV Protonix, continue to monitor CBC.  He was also given  octreotide for the concern  of variceal bleed. EGD showed normal esophagus, gastric antral vascular ectasia without bleeding, portal hypertensive gastropathy, nonbleeding duodenal ulcers with a clean ulcer base.  GI recommended to continue Protonix twice daily for a month then once a day and continues Carafate 3 times a day for a month.  He  will follow-up with GI as an outpatient in 2 weeks. Will check vitamin C62, folic acid  AKI: No history of CKD.  Creatinine worsened.  Kidney function did not improve with IV fluids. Started on  albumin infusion.  He is also severely volume overloaded so started on spironolactone,given iv lasix. Low suspicion for hepatorenal syndrome.ultrasound of the kidneys did not show  Hydronephrosis or stones.  Severe protein calorie malnutrition: Albumin in the range of 2.  Requested for dietitian evaluation  Liver cirrhosis: H/O alcoholic liver disease. Last drink was about 3 months ago. Has thrombocytopenia, elevated INR, icterus. liver US showed normal echogenicity  Pancytopenia: Associated with alcoholic liver disease.  Follows with hematology.  Hypertension: Antihypertensives on hold.  Monitor blood pressure  History of sarcoidosis: Currently stable  GERD: On Protonix  Debility/deconditioning: PT/OT consulted.         DVT prophylaxis:SCD Code Status:Full  Family Communication: called wife today on phone Status is: Inpatient  Remains inpatient appropriate because:Inpatient level of care appropriate due to severity of illness   Dispo: The patient is from: Home              Anticipated d/c is to: Home vs SNF              Anticipated d/c date is: 3 days              Patient currently is not medically stable to d/c.    Consultants: GI  Procedures:None  Antimicrobials:  Anti-infectives (From admission, onward)   Start     Dose/Rate Route Frequency Ordered Stop   04/20/20 1700  [MAR Hold]  rifaximin (XIFAXAN) tablet 550  mg        (MAR Hold since Fri 04/21/2020 at 0734.Hold Reason: Transfer to a Procedural area.)   550 mg Oral 2 times daily 04/20/20 1452     04/19/20 1800  cefTRIAXone (ROCEPHIN) 1 g in sodium chloride 0.9 % 100 mL IVPB        1 g 200 mL/hr over 30 Minutes Intravenous  Once 04/19/20 1752 04/19/20 1921      Subjective:  Patient seen and examined at the  bedside this morning.  Hemodynamically stable during my evaluation.  He was still confused and talking inappropriately.  Was not in any kind of distress.  He has bilateral severe lower extremity edema.    Objective: Vitals:   04/20/20 2126 04/21/20 0530 04/21/20 0741 04/21/20 0810  BP: (!) 152/76 (!) 138/59 (!) 153/43 (!) 150/73  Pulse: 100 (!) 110 (!) 113 (!) 114  Resp: 18 18 14 12   Temp: 98.4 F (36.9 C) 98 F (36.7 C) 98.6 F (37 C) 98.3 F (36.8 C)  TempSrc:  Oral    SpO2: 98% 99% 96% 96%  Weight:      Height:        Intake/Output Summary (Last 24 hours) at 04/21/2020 0839 Last data filed at 04/21/2020 8185 Gross per 24 hour  Intake 1883.49 ml  Output --  Net 1883.49 ml   Filed Weights   04/19/20 1530 04/19/20 1540  Weight: 107.5 kg 107.5 kg    Examination:  General exam: Confused, obese  HEENT:PERRL,Oral mucosa moist, Ear/Nose normal on gross exam Respiratory system: Bilateral equal air entry, normal vesicular breath sounds, no wheezes or crackles  Cardiovascular system: S1 & S2 heard, RRR. No JVD, murmurs, rubs, gallops or clicks. Gastrointestinal system: Abdomen is distended, soft and nontender. No organomegaly or masses felt. Normal bowel sounds heard. Central nervous system: Not alert and oriented. Extremities: 3-4+ bilateral lower extremity pitting edema, no clubbing ,no cyanosis Skin:icterus ,no pallor     Data Reviewed: I have personally reviewed following labs and imaging studies  CBC: Recent Labs  Lab 04/19/20 1616 04/19/20 2040 04/20/20 0109 04/20/20 0622 04/21/20 0437  WBC 13.6* 11.8* 11.7* 9.6 10.9*  NEUTROABS 11.4*  --   --   --  8.5*  HGB 6.9* 6.1* 7.3* 7.0* 6.8*  HCT 20.4* 18.7* 21.7* 20.3* 19.9*  MCV 106.8* 108.1* 106.4* 104.6* 106.4*  PLT 128* 92* 85* 77* 88*   Basic Metabolic Panel: Recent Labs  Lab 04/19/20 1616 04/20/20 0109 04/21/20 0437  NA 141 138 139  K 3.7 3.9 3.6  CL 103 98 102  CO2 25 28 27   GLUCOSE 163* 141*  136*  BUN 56* 66* 72*  CREATININE 1.57* 1.87* 1.91*  CALCIUM 8.2* 8.5* 8.2*   GFR: Estimated Creatinine Clearance: 44.3 mL/min (A) (by C-G formula based on SCr of 1.91 mg/dL (H)). Liver Function Tests: Recent Labs  Lab 04/19/20 1616 04/20/20 0109  AST 54* 53*  ALT 30 29  ALKPHOS 73 70  BILITOT 5.0* 5.0*  PROT 4.6* 4.7*  ALBUMIN 2.2* 2.3*   No results for input(s): LIPASE, AMYLASE in the last 168 hours. Recent Labs  Lab 04/19/20 1616 04/20/20 0810 04/21/20 0437  AMMONIA 108* 59* 32   Coagulation Profile: Recent Labs  Lab 04/19/20 1616  INR 1.8*   Cardiac Enzymes: No results for input(s): CKTOTAL, CKMB, CKMBINDEX, TROPONINI in the last 168 hours. BNP (last 3 results) No results for input(s): PROBNP in the last 8760 hours. HbA1C: No results for input(s): HGBA1C  in the last 72 hours. CBG: No results for input(s): GLUCAP in the last 168 hours. Lipid Profile: No results for input(s): CHOL, HDL, LDLCALC, TRIG, CHOLHDL, LDLDIRECT in the last 72 hours. Thyroid Function Tests: No results for input(s): TSH, T4TOTAL, FREET4, T3FREE, THYROIDAB in the last 72 hours. Anemia Panel: No results for input(s): VITAMINB12, FOLATE, FERRITIN, TIBC, IRON, RETICCTPCT in the last 72 hours. Sepsis Labs: No results for input(s): PROCALCITON, LATICACIDVEN in the last 168 hours.  Recent Results (from the past 240 hour(s))  Respiratory Panel by RT PCR (Flu A&B, Covid) - Nasopharyngeal Swab     Status: None   Collection Time: 04/19/20  4:16 PM   Specimen: Nasopharyngeal Swab  Result Value Ref Range Status   SARS Coronavirus 2 by RT PCR NEGATIVE NEGATIVE Final    Comment: (NOTE) SARS-CoV-2 target nucleic acids are NOT DETECTED.  The SARS-CoV-2 RNA is generally detectable in upper respiratoy specimens during the acute phase of infection. The lowest concentration of SARS-CoV-2 viral copies this assay can detect is 131 copies/mL. A negative result does not preclude SARS-Cov-2 infection and  should not be used as the sole basis for treatment or other patient management decisions. A negative result may occur with  improper specimen collection/handling, submission of specimen other than nasopharyngeal swab, presence of viral mutation(s) within the areas targeted by this assay, and inadequate number of viral copies (<131 copies/mL). A negative result must be combined with clinical observations, patient history, and epidemiological information. The expected result is Negative.  Fact Sheet for Patients:  PinkCheek.be  Fact Sheet for Healthcare Providers:  GravelBags.it  This test is no t yet approved or cleared by the [place] FDA and  has been authorized for detection and/or diagnosis of SARS-CoV-2 by FDA under an Emergency Use Authorization (EUA). This EUA will remain  in effect (meaning this test can be used) for the duration of the COVID-19 declaration under Section 564(b)(1) of the Act, 21 U.S.C. section 360bbb-3(b)(1), unless the authorization is terminated or revoked sooner.     Influenza A by PCR NEGATIVE NEGATIVE Final   Influenza B by PCR NEGATIVE NEGATIVE Final    Comment: (NOTE) The Xpert Xpress SARS-CoV-2/FLU/RSV assay is intended as an aid in  the diagnosis of influenza from Nasopharyngeal swab specimens and  should not be used as a sole basis for treatment. Nasal washings and  aspirates are unacceptable for Xpert Xpress SARS-CoV-2/FLU/RSV  testing.  Fact Sheet for Patients: PinkCheek.be  Fact Sheet for Healthcare Providers: GravelBags.it  This test is not yet approved or cleared by the [place] FDA and  has been authorized for detection and/or diagnosis of SARS-CoV-2 by  FDA under an Emergency Use Authorization (EUA). This EUA will remain  in effect (meaning this test can be used) for the duration of the  Covid-19 declaration  under Section 564(b)(1) of the Act, 21  U.S.C. section 360bbb-3(b)(1), unless the authorization is  terminated or revoked. Performed at [facility], [place] [address], [place], [place] [address]          Radiology Studies: CT HEAD WO CONTRAST  Result Date: 04/19/2020 CLINICAL DATA:  Confusion, fall a few months ago. EXAM: CT HEAD WITHOUT CONTRAST TECHNIQUE: Contiguous axial images were obtained from the base of the skull through the vertex without intravenous contrast. COMPARISON:  None. FINDINGS: Brain: Mild chronic small vessel disease within the deep white matter. No acute intracranial abnormality. Specifically, no hemorrhage, hydrocephalus, mass lesion, acute infarction, or significant intracranial injury. Vascular:  No hyperdense vessel or unexpected calcification. Skull: No acute calvarial abnormality. Sinuses/Orbits: Visualized paranasal sinuses and mastoids clear. Orbital soft tissues unremarkable. Other: None IMPRESSION: Mild chronic small vessel disease throughout the deep white matter. No acute intracranial abnormality. Electronically Signed   By: Rolm Baptise M.D.   On: 04/19/2020 17:51   US RENAL  Result Date: 04/20/2020 CLINICAL DATA:  Acute kidney injury EXAM: RENAL / URINARY TRACT ULTRASOUND COMPLETE COMPARISON:  Ultrasound 05/20/2017 FINDINGS: Right Kidney: Renal measurements: 10 x 5.8 x 5.6 cm = volume: 169.6 mL. Echogenicity normal. No mass or hydronephrosis. Probable echogenic stone within the mid to lower right kidney measuring 11 mm. Left Kidney: Renal measurements: 10.3 x 5.1 x 4.9 cm = volume: 134.7 mL. Cortical echogenicity is normal. No mass or hydronephrosis. Probable 7 mm echogenic stone within the upper pole. Bladder: Appears normal for degree of bladder distention. Other: None. IMPRESSION: 1. Probable bilateral kidney stones. 2. Negative for hydronephrosis Electronically Signed   By: Donavan Foil M.D.   On: 04/20/2020 21:15   DG Foot Complete  Right  Result Date: 04/19/2020 CLINICAL DATA:  Weakness and foot bruising EXAM: RIGHT FOOT COMPLETE - 3+ VIEW COMPARISON:  None. FINDINGS: Generalized soft tissue swelling is noted in the foot and ankle. No acute fracture or dislocation is noted. Calcaneal spurring is seen. IMPRESSION: Soft tissue swelling without acute bony abnormality. Electronically Signed   By: Inez Catalina M.D.   On: 04/19/2020 17:12   US Abdomen Limited RUQ (LIVER/GB)  Result Date: 04/20/2020 CLINICAL DATA:  Acute kidney injury EXAM: ULTRASOUND ABDOMEN LIMITED RIGHT UPPER QUADRANT COMPARISON:  Ultrasound 05/20/2017 FINDINGS: Gallbladder: No obvious shadowing stone.  Normal wall thickness. Common bile duct: Unable to visualize due to bowel gas and habitus. Liver: Echogenicity grossly normal. Unable to adequately visualize the portal vein. Other: None. IMPRESSION: 1. Very limited study secondary to habitus and physical condition of the patient. 2. Common bile and portal vein are not adequately visualized. Electronically Signed   By: Donavan Foil M.D.   On: 04/20/2020 21:18        Scheduled Meds: . [MAR Hold] sodium chloride   Intravenous Once  . [MAR Hold] sodium chloride   Intravenous Once  . [MAR Hold] lactulose  30 g Oral TID  . [MAR Hold] pantoprazole (PROTONIX) IV  40 mg Intravenous Q12H  . [MAR Hold] rifaximin  550 mg Oral BID   Continuous Infusions: . octreotide  (SANDOSTATIN)    IV infusion 50 mcg/hr (04/20/20 2321)     LOS: 2 days    Time spent:35 mins. More than 50% of that time was spent in counseling and/or coordination of care.      Shelly Coss, MD [facility] P10/22/2021, 8:39 AM

## 2020-04-21 NOTE — Transfer of Care (Signed)
Immediate Anesthesia Transfer of Care Note  Patient: Timothy Mckinney  Procedure(s) Performed: ESOPHAGOGASTRODUODENOSCOPY (EGD) WITH PROPOFOL (N/A ) BIOPSY  Patient Location: Endoscopy Unit  Anesthesia Type:General  Level of Consciousness: drowsy and patient cooperative  Airway & Oxygen Therapy: Patient Spontanous Breathing and Patient connected to face mask oxygen  Post-op Assessment: Report given to RN and Post -op Vital signs reviewed and stable  Post vital signs: Reviewed and stable  Last Vitals:  Vitals Value Taken Time  BP    Temp    Pulse    Resp    SpO2      Last Pain:  Vitals:   04/21/20 0530  TempSrc: Oral  PainSc:          Complications: No complications documented.

## 2020-04-21 NOTE — Interval H&P Note (Signed)
History and Physical Interval Note:  04/21/2020 8:16 AM  Timothy Mckinney  has presented today for surgery, with the diagnosis of IDA.  The various methods of treatment have been discussed with the patient and family. After consideration of risks, benefits and other options for treatment, the patient has consented to  Procedure(s): ESOPHAGOGASTRODUODENOSCOPY (EGD) WITH PROPOFOL (N/A) COLONOSCOPY WITH PROPOFOL (N/A) as a surgical intervention.  The patient's history has been reviewed, patient examined, no change in status, stable for surgery.  I have reviewed the patient's chart and labs.  Questions were answered to the patient's satisfaction.     [name] D

## 2020-04-21 NOTE — Op Note (Addendum)
[facility] Patient Name: Timothy Mckinney Procedure Date: 04/21/2020 MRN: [id number] Attending MD: Carol Ada , MD Date of Birth: [birth year] CSN: [id number] Age: 69 Admit Type: Outpatient Procedure:                Upper GI endoscopy Indications:              [place] Providers:                Carol Ada, MD, Cleda Daub, RN, Fransico Setters                            Mbumina, Technician Referring MD:              Medicines:                General Anesthesia Complications:            No immediate complications. Estimated Blood Loss:     Estimated blood loss was minimal. Procedure:                Pre-Anesthesia Assessment:                           - Prior to the procedure, a History and Physical                            was performed, and patient medications and                            allergies were reviewed. The patient's tolerance of                            previous anesthesia was also reviewed. The risks                            and benefits of the procedure and the sedation                            options and risks were discussed with the patient.                            All questions were answered, and informed consent                            was obtained. Prior Anticoagulants: The patient has                            taken no previous anticoagulant or antiplatelet                            agents. ASA Grade Assessment: III - A patient with                            severe systemic disease. After reviewing the risks                            and benefits,  the patient was deemed in                            satisfactory condition to undergo the procedure.                           - Sedation was administered by an anesthesia                            professional. General anesthesia was attained.                           After obtaining informed consent, the endoscope was                            passed under direct vision. Throughout the                             procedure, the patient's blood pressure, pulse, and                            oxygen saturations were monitored continuously. The                            [id number]-H190 ([id number]) Olympus gastroscope was                            introduced through the mouth, and advanced to the                            second part of duodenum. The upper GI endoscopy was                            accomplished without difficulty. The patient                            tolerated the procedure well. Scope In: Scope Out: Findings:      The esophagus was normal.      Mild gastric antral vascular ectasia without bleeding was present in the       gastric antrum.      Mild portal hypertensive gastropathy was found in the gastric fundus and       in the gastric body. Biopsies were taken with a cold forceps for       Helicobacter pylori testing.      Five non-bleeding cratered and superficial duodenal ulcers with a clean       ulcer base (Forrest Class III) were found in the duodenal bulb. The       largest lesion was 10 mm in largest dimension.      The duodenum exhibited 5 superficially cratered ulcers. They were       clean-based and there was no evidence of any bleeding. Timothy Mckinney was       noted with the surrounding mucosa. Gastric biopsies were obtained to       evaluate for H. pylori as the source of his ulcerations. There was no  evidence of bleeding with his GAVE. There was no evidence of any       esophageal or fundic varices. Impression:               - Normal esophagus.                           - Gastric antral vascular ectasia without bleeding.                           - Portal hypertensive gastropathy. Biopsied.                           - Non-bleeding duodenal ulcers with a clean ulcer                            base (Forrest Class III). Moderate Sedation:      Not Applicable - Patient had care per Anesthesia. Recommendation:           - Return patient to  hospital ward for ongoing care.                           - Resume regular diet.                           - Continue present medications.                           - Await pathology results.                           - Return to GI office in 2 weeks with Dr. [name].                           - Repeat EGD in 2 years.                           - PPI BID x 1 month and then QD indefinitely.                           - Sucralfate 1 gram TID x 1 month. Procedure Code(s):        --- Professional ---                           [phone], Esophagogastroduodenoscopy, flexible,                            transoral; with biopsy, single or multiple Diagnosis Code(s):        --- Professional ---                           K31.819, Angiodysplasia of stomach and duodenum                            without bleeding  K76.6, Portal hypertension                           K31.89, Other diseases of stomach and duodenum                           K26.9, Duodenal ulcer, unspecified as acute or                            chronic, without hemorrhage or perforation                           K92.1, Melena (includes Hematochezia) CPT copyright 2019 American Medical Association. All rights reserved. The codes documented in this report are preliminary and upon coder review may  be revised to meet current compliance requirements. Carol Ada, MD Carol Ada, MD 04/21/2020 9:13:44 AM This report has been signed electronically. Number of Addenda: 0

## 2020-04-21 NOTE — Anesthesia Procedure Notes (Signed)
Procedure Name: Intubation Date/Time: 04/21/2020 8:43 AM Performed by: Montel Clock, CRNA Pre-anesthesia Checklist: Patient identified, Emergency Drugs available, Suction available, Patient being monitored and Timeout performed Patient Re-evaluated:Patient Re-evaluated prior to induction Oxygen Delivery Method: Circle system utilized Preoxygenation: Pre-oxygenation with 100% oxygen Induction Type: IV induction, Rapid sequence and Cricoid Pressure applied Laryngoscope Size: Mac and 4 Grade View: Grade I Tube type: Oral Tube size: 7.5 mm Number of attempts: 1 Airway Equipment and Method: Stylet Placement Confirmation: ETT inserted through vocal cords under direct vision,  positive ETCO2 and breath sounds checked- equal and bilateral Secured at: 23 cm Tube secured with: Tape Dental Injury: Teeth and Oropharynx as per pre-operative assessment

## 2020-04-22 ENCOUNTER — Inpatient Hospital Stay (HOSPITAL_COMMUNITY): Payer: PPO

## 2020-04-22 DIAGNOSIS — K7200 Acute and subacute hepatic failure without coma: Secondary | ICD-10-CM

## 2020-04-22 DIAGNOSIS — K921 Melena: Secondary | ICD-10-CM

## 2020-04-22 DIAGNOSIS — D689 Coagulation defect, unspecified: Secondary | ICD-10-CM

## 2020-04-22 DIAGNOSIS — N179 Acute kidney failure, unspecified: Secondary | ICD-10-CM

## 2020-04-22 DIAGNOSIS — D62 Acute posthemorrhagic anemia: Secondary | ICD-10-CM | POA: Diagnosis not present

## 2020-04-22 DIAGNOSIS — K709 Alcoholic liver disease, unspecified: Secondary | ICD-10-CM

## 2020-04-22 DIAGNOSIS — R42 Dizziness and giddiness: Secondary | ICD-10-CM | POA: Diagnosis not present

## 2020-04-22 LAB — CBC WITH DIFFERENTIAL/PLATELET
Abs Immature Granulocytes: 0.04 10*3/uL (ref 0.00–0.07)
Basophils Absolute: 0 10*3/uL (ref 0.0–0.1)
Basophils Relative: 0 %
Eosinophils Absolute: 0 10*3/uL (ref 0.0–0.5)
Eosinophils Relative: 0 %
HCT: 18 % — ABNORMAL LOW (ref 39.0–52.0)
Hemoglobin: 6.1 g/dL — CL (ref 13.0–17.0)
Immature Granulocytes: 1 %
Lymphocytes Relative: 18 %
Lymphs Abs: 0.7 10*3/uL (ref 0.7–4.0)
MCH: 34.9 pg — ABNORMAL HIGH (ref 26.0–34.0)
MCHC: 33.9 g/dL (ref 30.0–36.0)
MCV: 102.9 fL — ABNORMAL HIGH (ref 80.0–100.0)
Monocytes Absolute: 0.4 10*3/uL (ref 0.1–1.0)
Monocytes Relative: 10 %
Neutro Abs: 2.9 10*3/uL (ref 1.7–7.7)
Neutrophils Relative %: 71 %
Platelets: 54 10*3/uL — ABNORMAL LOW (ref 150–400)
RBC: 1.75 MIL/uL — ABNORMAL LOW (ref 4.22–5.81)
RDW: 19.1 % — ABNORMAL HIGH (ref 11.5–15.5)
WBC: 4 10*3/uL (ref 4.0–10.5)
nRBC: 0 % (ref 0.0–0.2)

## 2020-04-22 LAB — BASIC METABOLIC PANEL
Anion gap: 13 (ref 5–15)
BUN: 74 mg/dL — ABNORMAL HIGH (ref 8–23)
CO2: 26 mmol/L (ref 22–32)
Calcium: 8.4 mg/dL — ABNORMAL LOW (ref 8.9–10.3)
Chloride: 100 mmol/L (ref 98–111)
Creatinine, Ser: 1.84 mg/dL — ABNORMAL HIGH (ref 0.61–1.24)
GFR, Estimated: 39 mL/min — ABNORMAL LOW (ref >60)
Glucose, Bld: 152 mg/dL — ABNORMAL HIGH (ref 70–99)
Potassium: 3.4 mmol/L — ABNORMAL LOW (ref 3.5–5.1)
Sodium: 139 mmol/L (ref 135–145)

## 2020-04-22 LAB — T4, FREE: Free T4: 0.82 ng/dL (ref 0.61–1.12)

## 2020-04-22 LAB — PREPARE RBC (CROSSMATCH)

## 2020-04-22 MED ORDER — SODIUM CHLORIDE 0.9% IV SOLUTION: Freq: Once | INTRAVENOUS | Status: AC

## 2020-04-22 MED ORDER — FUROSEMIDE 10 MG/ML IJ SOLN
80.0000 mg | Freq: Two times a day (BID) | INTRAMUSCULAR | Status: DC
  Administered 2020-04-22 – 2020-04-23 (×4): 80 mg via INTRAVENOUS
  Filled 2020-04-22 (×5): qty 8

## 2020-04-22 MED ORDER — POTASSIUM CHLORIDE CRYS ER 20 MEQ PO TBCR
40.0000 meq | EXTENDED_RELEASE_TABLET | Freq: Once | ORAL | Status: AC
  Administered 2020-04-22: 40 meq via ORAL
  Filled 2020-04-22: qty 2

## 2020-04-22 NOTE — Evaluation (Signed)
Occupational Therapy Evaluation Patient Details Name: Timothy Mckinney MRN: [id number] DOB: 1951/03/04 Today's Date: 04/22/2020    History of Present Illness 69 yo male admitted with acute hepatic encephalopathy, weakness. Hx of DDD, sarcoidosis, ETOH liver cirrhosis, anxiety.   Clinical Impression   Mr. Timothy Mckinney is a 69 year old man who presents with generalized weakness, decreased activity tolerance, edema in all four extremities, and decreased balance resulting in a decline in ability to perform independent mobility and ADLs. On evaluation patient confused with perseveration on incontinence and "peeing on my heels" with slow thought processing and speech. Patient mod x 2 to transfer to side of bed and min assist to take steps with RW towards door. Patient required total assist for toileting and lower body dressing. Patient will benefit from skilled OT services while in hospital in order to improve deficits and learn compensatory strategies as needed in order to return to PLOF.     Follow Up Recommendations  SNF    Equipment Recommendations  Other (comment) (Defer to Next Venue)    Recommendations for Other Services       Precautions / Restrictions Precautions Precautions: Fall Precaution Comments: Bruising on R foot Restrictions Weight Bearing Restrictions: No      Mobility Bed Mobility Overal bed mobility: Needs Assistance Bed Mobility: Supine to Sit     Supine to sit: Mod assist;+2 for physical assistance;+2 for safety/equipment;HOB elevated     General bed mobility comments: Assist for trunk and bil LEs. Increased time. Utilized bedpad for scooting to EOB. Cues required.    Transfers Overall transfer level: Needs assistance Equipment used: Rolling walker (2 wheeled) Transfers: Sit to/from Stand Sit to Stand: Min assist;From elevated surface;+2 safety/equipment         General transfer comment: Assist to rise, stabilize, control descent. Cues for safety,  technique, hand placement.    Balance Overall balance assessment: Needs assistance;History of Falls Sitting-balance support: No upper extremity supported;Bilateral upper extremity supported Sitting balance-Leahy Scale: Fair     Standing balance support: Bilateral upper extremity supported Standing balance-Leahy Scale: Poor                             ADL either performed or assessed with clinical judgement   ADL Overall ADL's : Needs assistance/impaired Eating/Feeding: Set up;Sitting   Grooming: Sitting;Set up   Upper Body Bathing: Moderate assistance;Set up;Sitting   Lower Body Bathing: Maximal assistance;Set up;Sit to/from stand   Upper Body Dressing : Moderate assistance;Sitting   Lower Body Dressing: Total assistance;Sit to/from stand   Toilet Transfer: Minimal assistance;+2 for safety/equipment;BSC;Stand-pivot;RW   Toileting- Clothing Manipulation and Hygiene: Total assistance;Sit to/from stand               Vision   Vision Assessment?: No apparent visual deficits     Perception     Praxis      Pertinent Vitals/Pain Pain Assessment: Faces Faces Pain Scale: Hurts a little bit Pain Location: toes Pain Descriptors / Indicators: Discomfort;Sore Pain Intervention(s): Monitored during session     Hand Dominance Right   Extremity/Trunk Assessment Upper Extremity Assessment Upper Extremity Assessment: Overall WFL for tasks assessed   Lower Extremity Assessment Lower Extremity Assessment: Defer to PT evaluation RLE Deficits / Details: bruised toes.   Cervical / Trunk Assessment Cervical / Trunk Assessment: Normal   Communication Communication Communication: No difficulties   Cognition Arousal/Alertness: Awake/alert Behavior During Therapy: WFL for tasks assessed/performed Overall Cognitive Status: Impaired/Different  from baseline Area of Impairment: Memory;Problem solving;Following commands;Orientation                  Orientation Level: Disoriented to;Time;Situation   Memory: Decreased short-term memory Following Commands: Follows one step commands with increased time     Problem Solving: Slow processing;Requires verbal cues     General Comments       Exercises     Shoulder Instructions      Home Living Family/patient expects to be discharged to:: Skilled nursing facility Living Arrangements: Spouse/significant other   Type of Home: House Home Access: Stairs to enter Entrance Stairs-Number of Steps: 4   Home Layout: Multi-level;Bed/bath upstairs               Home Equipment: Walker - 2 wheels   Additional Comments: using a rw after he fell 6 weeks ago. Then improved and able to not use.      Prior Functioning/Environment          Comments: Last week had a significant decline in functional abilities.        OT Problem List: Decreased strength;Decreased activity tolerance;Impaired balance (sitting and/or standing);Decreased cognition;Decreased safety awareness;Decreased knowledge of use of DME or AE;Increased edema;Pain;Obesity      OT Treatment/Interventions: Self-care/ADL training;Therapeutic exercise;DME and/or AE instruction;Patient/family education;Therapeutic activities;Balance training;Cognitive remediation/compensation    OT Goals(Current goals can be found in the care plan section) Acute Rehab OT Goals Patient Stated Goal: to get better. rehab (per wife) OT Goal Formulation: With family Time For Goal Achievement: 05/06/20 Potential to Achieve Goals: Good  OT Frequency: Min 2X/week   Barriers to D/C:            Co-evaluation PT/OT/SLP Co-Evaluation/Treatment: Yes Reason for Co-Treatment: For patient/therapist safety;To address functional/ADL transfers PT goals addressed during session: Mobility/safety with mobility OT goals addressed during session: ADL's and self-care      AM-PAC OT "6 Clicks" Daily Activity     Outcome Measure Help from another  person eating meals?: A Little Help from another person taking care of personal grooming?: A Little Help from another person toileting, which includes using toliet, bedpan, or urinal?: Total Help from another person bathing (including washing, rinsing, drying)?: A Lot Help from another person to put on and taking off regular upper body clothing?: A Lot Help from another person to put on and taking off regular lower body clothing?: Total 6 Click Score: 12   End of Session Equipment Utilized During Treatment: Rolling walker;Gait belt Nurse Communication: Mobility status  Activity Tolerance: Patient tolerated treatment well Patient left: in chair;with call bell/phone within reach;with chair alarm set;with family/visitor present  OT Visit Diagnosis: Muscle weakness (generalized) (M62.81);Unsteadiness on feet (R26.81);Pain Pain - Right/Left: Right Pain - part of body: Ankle and joints of foot                Time: 1335-1418 OT Time Calculation (min): 43 min Charges:  OT General Charges $OT Visit: 1 Visit OT Evaluation $OT Eval Moderate Complexity: 1 Mod  Reginald Mangels, OTR/L [place]  Office [phone] Pager: [place] 04/22/2020, 4:18 PM

## 2020-04-22 NOTE — Progress Notes (Signed)
CRITICAL VALUE ALERT  Critical Value:  Hgb 6.1  Date & Time Notied:  04/22/20 3383  Provider Notified: Tawanna Solo  Orders Received/Actions taken: awaiting orders

## 2020-04-22 NOTE — Evaluation (Signed)
Physical Therapy Evaluation Patient Details Name: Timothy Mckinney MRN: [id number] DOB: 02-14-1951 Today's Date: 04/22/2020   History of Present Illness  69 yo male admitted with acute hepatic encephalopathy, weakness. Hx of DDD, sarcoidosis, ETOH liver cirrhosis, anxiety.  Clinical Impression  On eval, pt required Mod assist +2 for safety. He was able to walk ~5 feet with use of a RW. Pt presents with general weakness, decreased activity tolerance, and impaired gait and balance. Pt is confused with difficulty attending to task at times. He has some trouble clearly expressing his thoughts. Wife was present during session and helped with encouraging pt to participate. Recommendation is for ST rehab at [facility]. Will plan to follow pt during hospital stay and progress activity as tolerated.     Follow Up Recommendations SNF    Equipment Recommendations  None recommended by PT    Recommendations for Other Services       Precautions / Restrictions Precautions Precautions: Fall Restrictions Weight Bearing Restrictions: No      Mobility  Bed Mobility Overal bed mobility: Needs Assistance Bed Mobility: Supine to Sit     Supine to sit: Mod assist;+2 for physical assistance;+2 for safety/equipment;HOB elevated     General bed mobility comments: Assist for trunk and bil LEs. Increased time. Utilized bedpad for scooting to EOB. Cues required.    Transfers Overall transfer level: Needs assistance Equipment used: Rolling walker (2 wheeled) Transfers: Sit to/from Stand Sit to Stand: Min assist;From elevated surface;+2 safety/equipment         General transfer comment: Assist to rise, stabilize, control descent. Cues for safety, technique, hand placement.  Ambulation/Gait Ambulation/Gait assistance: Min assist;+2 safety/equipment Gait Distance (Feet): 5 Feet Assistive device: Rolling walker (2 wheeled) Gait Pattern/deviations: Step-through pattern;Decreased stride length      General Gait Details: Assist to stabilize during short distance. Followed closely with recliner. Cues for safety, RW proximity, posutre.  Stairs            Wheelchair Mobility    Modified Rankin (Stroke Patients Only)       Balance Overall balance assessment: Needs assistance;History of Falls         Standing balance support: Bilateral upper extremity supported Standing balance-Leahy Scale: Poor                               Pertinent Vitals/Pain Pain Assessment: Faces Faces Pain Scale: Hurts a little bit Pain Location: toes Pain Descriptors / Indicators: Discomfort;Sore Pain Intervention(s): Limited activity within patient's tolerance;Monitored during session;Repositioned    Home Living Family/patient expects to be discharged to:: Skilled nursing facility Living Arrangements: Spouse/significant other   Type of Home: House Home Access: Stairs to enter   [employer] of Steps: 4 Home Layout: Multi-level;Bed/bath upstairs Home Equipment: Walker - 2 wheels Additional Comments: using a rw after he fell, then he would put it to the side. fall 6 weeks ago.    Prior Function           Comments: Last week had a significant decline.     Hand Dominance   Dominant Hand: Right    Extremity/Trunk Assessment   Upper Extremity Assessment Upper Extremity Assessment: Defer to OT evaluation    Lower Extremity Assessment Lower Extremity Assessment: Generalized weakness;RLE deficits/detail (Bil LE with significant edema) RLE Deficits / Details: bruised toes.    Cervical / Trunk Assessment Cervical / Trunk Assessment: Normal  Communication      Cognition  Arousal/Alertness: Awake/alert Behavior During Therapy: WFL for tasks assessed/performed Overall Cognitive Status: Impaired/Different from baseline Area of Impairment: Memory;Problem solving;Following commands;Orientation                 Orientation Level: Disoriented  to;Time;Situation   Memory: Decreased short-term memory Following Commands: Follows one step commands with increased time     Problem Solving: Slow processing;Requires verbal cues        General Comments      Exercises     Assessment/Plan    PT Assessment Patient needs continued PT services  PT Problem List Decreased strength;Decreased activity tolerance;Decreased balance;Decreased range of motion;Decreased knowledge of use of DME;Decreased cognition;Decreased mobility       PT Treatment Interventions DME instruction;Gait training;Therapeutic activities;Therapeutic exercise;Patient/family education;Balance training;Functional mobility training    PT Goals (Current goals can be found in the Care Plan section)  Acute Rehab PT Goals Patient Stated Goal: to get better. rehab (per wife) PT Goal Formulation: With patient/family Time For Goal Achievement: 05/06/20 Potential to Achieve Goals: Good    Frequency Min 3X/week   Barriers to discharge        Co-evaluation               AM-PAC PT "6 Clicks" Mobility  Outcome Measure Help needed turning from your back to your side while in a flat bed without using bedrails?: A Lot Help needed moving from lying on your back to sitting on the side of a flat bed without using bedrails?: A Lot Help needed moving to and from a bed to a chair (including a wheelchair)?: A Little Help needed standing up from a chair using your arms (e.g., wheelchair or bedside chair)?: A Little Help needed to walk in hospital room?: A Little Help needed climbing 3-5 steps with a railing? : Total 6 Click Score: 14    End of Session Equipment Utilized During Treatment: Gait belt Activity Tolerance: Patient tolerated treatment well Patient left: in chair;with call bell/phone within reach;with chair alarm set;with family/visitor present   PT Visit Diagnosis: Muscle weakness (generalized) (M62.81);History of falling (Z91.81);Other abnormalities of  gait and mobility (R26.89)    Time: 4332-9518 PT Time Calculation (min) (ACUTE ONLY): 42 min   Charges:   PT Evaluation $PT Eval Moderate Complexity: 1 Mod             Doreatha Massed, PT Acute Rehabilitation  Office: [phone] Pager: [phone]

## 2020-04-22 NOTE — Progress Notes (Signed)
PT Cancellation Note  Patient Details Name: Timothy Mckinney MRN: [id number] DOB: [birth year]   Cancelled Treatment:    Reason Eval/Treat Not Completed: Medical issues which prohibited therapy--hgb 6.1. Will hold PT for now and check back at a later time/date.    [facility]  Office: [phone] Pager: [phone]

## 2020-04-22 NOTE — Progress Notes (Addendum)
[facility] Progress Note Covering for Dr. [name] and Dr. [name]  CC:  Melena, anemia and cirrhosis   Subjective: He is confused this morning, however, he knows he is at [facility]. His RN stated he is less confused today when compared to yesterday. He passed a normal brown BM this morning. No rectal bleeding or melena. He denies having any nausea or vomiting.  No abdominal pain.  Chest pain or shortness of breath.  Objective:   S/P EGD by Dr. [name] 04/21/2020: - Normal  Esophagus  - Gastric antral vascular ectasia without bleeding. - Portal hypertensive gastropathy. Biopsied. - Non-bleeding duodenal ulcers with a clean ulcer base (Forrest Class III).  Vital signs in last 24 hours: Temp:  [97.7 F (36.5 C)-99.1 F (37.3 C)] 97.8 F (36.6 C) (10/23 0840) Pulse Rate:  [71-113] 72 (10/23 0840) Resp:  [16-20] 18 (10/23 0824) BP: (118-153)/(43-68) 128/43 (10/23 0840) SpO2:  [95 %-98 %] 97 % (10/23 0840) Last BM Date: 04/22/20 General: 69 year old male alert but appears fatigued sitting in the bed in no acute distress. Eyes: Mild scleral icterus.  PERRLA. Heart: Regular rate and rhythm.  No murmurs. Pulm: Breath sounds clear throughout. Abdomen: Obese abdomen, soft, no masses or obvious hepatosplenomegaly.  Active bowel sounds to all 4 quadrants. Extremities: 3-4+ pitting edema to the lower extremities bilaterally.  Right foot and 4 metatarsals with ecchymosis. Neurologic:  Alert to name and place.  He is disoriented to time.  His verbal response to questions is delayed.  His speech is clear.  He is moving all extremities.  His upper extremities are tremulous on asterixis exam, concerning for developing asterixis. Psych: He is calm and cooperative.  Intake/Output from previous day: 10/22 0701 - 10/23 0700 In: 2254.6 [P.O.:680; I.V.:981.2; Blood:307; IV Piggyback:86.4] Out: 1550 [Urine:1550] Intake/Output this shift: Total I/O In: 280 [I.V.:250; Blood:30] Out: -   Lab  Results: Recent Labs    04/20/20 0622 04/21/20 0437 04/22/20 0548  WBC 9.6 10.9* 4.0  HGB 7.0* 6.8* 6.1*  HCT 20.3* 19.9* 18.0*  PLT 77* 88* 54*   BMET Recent Labs    04/20/20 0109 04/21/20 0437 04/22/20 0548  NA 138 139 139  K 3.9 3.6 3.4*  CL 98 102 100  CO2 28 27 26   GLUCOSE 141* 136* 152*  BUN 66* 72* 74*  CREATININE 1.87* 1.91* 1.84*  CALCIUM 8.5* 8.2* 8.4*   LFT Recent Labs    04/20/20 0109  PROT 4.7*  ALBUMIN 2.3*  AST 53*  ALT 29  ALKPHOS 70  BILITOT 5.0*   PT/INR Recent Labs    04/19/20 1616  LABPROT 20.5*  INR 1.8*   Hepatitis Panel No results for input(s): HEPBSAG, HCVAB, HEPAIGM, HEPBIGM in the last 72 hours.  US RENAL  Result Date: 04/20/2020 CLINICAL DATA:  Acute kidney injury EXAM: RENAL / URINARY TRACT ULTRASOUND COMPLETE COMPARISON:  Ultrasound 05/20/2017 FINDINGS: Right Kidney: Renal measurements: 10 x 5.8 x 5.6 cm = volume: 169.6 mL. Echogenicity normal. No mass or hydronephrosis. Probable echogenic stone within the mid to lower right kidney measuring 11 mm. Left Kidney: Renal measurements: 10.3 x 5.1 x 4.9 cm = volume: 134.7 mL. Cortical echogenicity is normal. No mass or hydronephrosis. Probable 7 mm echogenic stone within the upper pole. Bladder: Appears normal for degree of bladder distention. Other: None. IMPRESSION: 1. Probable bilateral kidney stones. 2. Negative for hydronephrosis Electronically Signed   By: Donavan Foil M.D.   On: 04/20/2020 21:15   EEG adult  Result Date: 04/21/2020 Lora Havens, MD     04/21/2020  2:19 PM Patient Name: Timothy Mckinney MRN: [id number] Epilepsy Attending: Lora Havens Referring Physician/Provider: Dr Shelly Coss Date: 04/21/2020 Duration: 22.40 mins Patient history: 69 year old male with altered mental status.  EEG evaluate for seizure. Level of alertness: Awake AEDs during EEG study: None Technical aspects: This EEG study was done with scalp electrodes positioned according to the 10-20  International system of electrode placement. Electrical activity was acquired at a sampling rate of 500Hz  and reviewed with a high frequency filter of 70Hz  and a low frequency filter of 1Hz . EEG data were recorded continuously and digitally stored. Description: No posterior dominant rhythm seen.  EEG showed continuous generalized 5 to 6 Hz theta slowing as well as intermittent rhythmicgeneralized 2 to 3 Hz delta slowing which at times appeared sharply contoured hyperventilation and photic stimulation were not performed.   ABNORMALITY - Continuous slow, generalized - Intermittent rhythmic delta slow, generalized IMPRESSION: This study is suggestive of moderate diffuse encephalopathy, nonspecific etiology. No seizures or epileptiform discharges were seen throughout the recording. Priyanka Barbra Sarks   US Abdomen Limited RUQ (LIVER/GB)  Result Date: 04/20/2020 CLINICAL DATA:  Acute kidney injury EXAM: ULTRASOUND ABDOMEN LIMITED RIGHT UPPER QUADRANT COMPARISON:  Ultrasound 05/20/2017 FINDINGS: Gallbladder: No obvious shadowing stone.  Normal wall thickness. Common bile duct: Unable to visualize due to bowel gas and habitus. Liver: Echogenicity grossly normal. Unable to adequately visualize the portal vein. Other: None. IMPRESSION: 1. Very limited study secondary to habitus and physical condition of the patient. 2. Common bile and portal vein are not adequately visualized. Electronically Signed   By: Donavan Foil M.D.   On: 04/20/2020 21:18    Assessment / Plan:  5. 69 year old male with decompensated ETOH cirrhosis.  Last alcohol intake was reported 3 months ago. 2 g low-sodium diet Continue diuretics as previously ordered with close monitoring of renal status Continue IV albumin as previously ordered  2. Mildly elevated LFTs, secondary to # 1.  Labs 10/21 AST 53.  ALT 29. T. Bili 5.0.  Right upper quadrant abdominal sonogram 10/21 resulted in a limited study secondary to his habitus, the common bile duct  and portal vein were not adequately visualized.  No obvious gallstones or gallbladder wall thickening.  Normal liver echogenicity. CMP in a.m.  3. UGI bleed and anemia. Admission Hg 6.9 -> Hg 6.1. Transfused 1 unit of PRBCs on 10/20 Hg 7.3 -> 6.8 on 10/22. Received 2nd PRBC transfusion. HgToday Hg 6.1. Two units of PRBCs ordered, the first unit transfusing at this time.  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  Biopsies are pending. Check H&H posttransfusion CBC in a.m. Continue PPI twice daily and Carafate 3 times daily Monitoring patient for active GI bleeding  4.  Acute hepatic encephalopathy.  EEG showed moderate diffuse encephalopathy.  Brain MRI ordered. Continue Xifaxan and Lactulose Monitor neuro status closely Ammonia level in a.m. Recommend thiamine and folic acid  5. Hypokalemia. K+ 3.4.  -KCL replacement per the hospitalist   6. AKI. Cr. 1.84. Monitor renal status closely as he remains on spironolactone and furosemide  7. Thrombocytopenia secondary to # 1. PLT 54.   8.  Coagulopathy  secondary to #1.  INR 1.8 INR in am  Further recommendations per Dr. Loletha Carrow  Principal Problem:   Acute hepatic encephalopathy Active Problems:   GERD (gastroesophageal  reflux disease)   Hypertension   Sarcoidosis ([place])   Generalized anxiety disorder   Alcoholic liver disease ([place])     LOS: 3 days   Noralyn Pick  04/22/2020, 9:56 AM  I have discussed the case with the PA, and that is the plan I formulated. I personally interviewed and examined the patient.  Nursing confirms the patient has not passed melena since yesterday.  He has a drop in hemoglobin felt to be equilibration.  He is still coagulopathic and renal function borderline. Hepatic encephalopathy being treated, his mental status has not fully cleared yet on lactulose and rifaximin. His wife was  asking about whether colonoscopy was necessary, but it does not appear to be at this point.  He had an identifiable source of bleeding on upper endoscopy yesterday which had stopped without overt bleeding since then.  Transfused earlier today, we will follow his hemoglobin counts and check on him tomorrow.  Dr. [name] will return on Monday.   35 minutes were spent on this encounter (including chart review, history/exam, counseling/coordination of care, and documentation)   Nelida Meuse III Office: [phone]

## 2020-04-22 NOTE — Progress Notes (Signed)
PROGRESS NOTE    CRISPIN VOGEL  HDQ:[id number] DOB: 23-Oct-1950 DOA: 04/19/2020 PCP: [name], MD   Chief Complain: Confusion, jaundice  Brief Narrative: Patient is a 69 year old male with history of hypertension, degenerative disc disease, nephrolithiasis, thrombocytopenia, GERD, liver cirrhosis who presents to the emergency room with complaints of confusion, increased jaundice, fall.  Patient was found to be taking presentation, confused.  FOBT was positive, he was anemic on presentation.  Ammonia was found to be elevated, T bili of 5.  Patient was admitted for the management of acute encephalopathy, GI bleed.  GI consulted and he underwent EGD .  Hospital course remarkable for persistent confusion.  Assessment & Plan:   Principal Problem:   Acute hepatic encephalopathy Active Problems:   GERD (gastroesophageal reflux disease)   Hypertension   Sarcoidosis ([place])   Generalized anxiety disorder   Alcoholic liver disease ([place])   Acute  encephalopathy: Hepatic encephalopathy suspected on presentation.  Ammonia was severely elevated on presentation.  Patient was confused.  Not taking lactulose at home. . Ammonia level has improved now.  Continue lactulose, rifaximin.  He was still confused this morning.  His persistent confusion could be secondary to Wernicke's encephalopathy from chronic alcohol abuse, but needs to rule out stroke. Pending vitamin B1. We will also do MRI of the brain . EEG showed moderate diffuse encephalopathy but no seizures on epileptiform activities.  Suspected GI bleed/macrocytic anemia: Hemoglobin in the range of 6 on presentation.  Status post transfusion with 3 units of  PRBC during this hospitalization. GI consulted,underwent EGD.  On IV Protonix, continue to monitor CBC.  He was also given  octreotide for the concern  of variceal bleed. EGD showed normal esophagus, gastric antral vascular ectasia without bleeding, portal hypertensive gastropathy,  nonbleeding duodenal ulcers with a clean ulcer base.  GI recommended to continue Protonix twice daily for a month then once a day and continues Carafate 3 times a day for a month.  He will follow-up with GI as an outpatient in 2 weeks. Normal JJH41, folic acid  AKI: No history of CKD.Kidney function did not improve with IV fluids. Given  albumin infusion.  He is also severely volume overloaded so started on spironolactone,given iv lasix. Low suspicion for hepatorenal syndrome.ultrasound of the kidneys did not show  hydronephrosis or stones.  Continue IV diuresis because he is severely volume overloaded.  Severe protein calorie malnutrition: Albumin in the range of 2. requested dietitian evaluation  Liver cirrhosis: H/O alcoholic liver disease. Last drink was about 3 months ago. Has thrombocytopenia, elevated INR, icterus. liver US showed normal echogenicity.  Pancytopenia: Associated with alcoholic liver disease.  Follows with hematology.  Hypertension: Antihypertensives on hold.  Monitor blood pressure  History of sarcoidosis: Currently stable  GERD: On Protonix  Low TSH: Cud be  euthyroid sick syndrome.  Follow-up free T3,T4  Debility/deconditioning: PT/OT consulted.  Nutrition Problem: Inadequate oral intake Etiology: nausea, vomiting, lethargy/confusion      DVT prophylaxis:SCD Code Status:Full  Family Communication: called wife  on phone on 10.22.21 Status is: Inpatient  Remains inpatient appropriate because:Inpatient level of care appropriate due to severity of illness   Dispo: The patient is from: Home              Anticipated d/c is to: Home vs SNF              Anticipated d/c date is: 3 days  Patient currently is not medically stable to d/c.    Consultants: GI  Procedures:None  Antimicrobials:  Anti-infectives (From admission, onward)   Start     Dose/Rate Route Frequency Ordered Stop   04/20/20 1700  rifaximin (XIFAXAN) tablet 550 mg         550 mg Oral 2 times daily 04/20/20 1452     04/19/20 1800  cefTRIAXone (ROCEPHIN) 1 g in sodium chloride 0.9 % 100 mL IVPB        1 g 200 mL/hr over 30 Minutes Intravenous  Once 04/19/20 1752 04/19/20 1921      Subjective:  Patient seen and examined at the bedside this morning.  Hemodynamically stable during my evaluation.  Looks more comfortable than yesterday and more alert and awake.  He is oriented to place.  Denies any complaints ,looks volume overloaded    Objective: Vitals:   04/21/20 1424 04/21/20 1811 04/21/20 2028 04/22/20 0423  BP: 140/64 (!) 143/68 (!) 153/58 (!) 118/43  Pulse: (!) 109 (!) 110 (!) 110 71  Resp: 20 17  16   Temp: 98 F (36.7 C) 98.5 F (36.9 C) 98.6 F (37 C) 97.7 F (36.5 C)  TempSrc: Oral Oral Oral Oral  SpO2: 98% 97% 96% 95%  Weight:      Height:        Intake/Output Summary (Last 24 hours) at 04/22/2020 0756 Last data filed at 04/22/2020 0400 Gross per 24 hour  Intake 1950.98 ml  Output 1550 ml  Net 400.98 ml   Filed Weights   04/19/20 1530 04/19/20 1540  Weight: 107.5 kg 107.5 kg    Examination:  General exam: Not in distress,obese, chronically ill looking HEENT:PERRL,Oral mucosa moist, Ear/Nose normal on gross exam Respiratory system: Bilateral equal air entry, normal vesicular breath sounds, no wheezes or crackles  Cardiovascular system: S1 & S2 heard, RRR. No JVD, murmurs, rubs, gallops or clicks. Gastrointestinal system: Abdomen is distended, soft and nontender.  Ascites.  No organomegaly or masses felt. Normal bowel sounds heard. Central nervous system: Alert and awake but oriented to place only extremities: 3-4+ bilateral lower extremity pitting edema, no clubbing ,no cyanosis Skin:  icterus ,no ulcers   Data Reviewed: I have personally reviewed following labs and imaging studies  CBC: Recent Labs  Lab 04/19/20 1616 04/19/20 1616 04/19/20 2040 04/20/20 0109 04/20/20 0622 04/21/20 0437 04/22/20 0548  WBC 13.6*   < >  11.8* 11.7* 9.6 10.9* 4.0  NEUTROABS 11.4*  --   --   --   --  8.5* 2.9  HGB 6.9*   < > 6.1* 7.3* 7.0* 6.8* 6.1*  HCT 20.4*   < > 18.7* 21.7* 20.3* 19.9* 18.0*  MCV 106.8*   < > 108.1* 106.4* 104.6* 106.4* 102.9*  PLT 128*   < > 92* 85* 77* 88* 54*   < > = values in this interval not displayed.   Basic Metabolic Panel: Recent Labs  Lab 04/19/20 1616 04/20/20 0109 04/21/20 0437 04/22/20 0548  NA 141 138 139 139  K 3.7 3.9 3.6 3.4*  CL 103 98 102 100  CO2 25 28 27 26   GLUCOSE 163* 141* 136* 152*  BUN 56* 66* 72* 74*  CREATININE 1.57* 1.87* 1.91* 1.84*  CALCIUM 8.2* 8.5* 8.2* 8.4*   GFR: Estimated Creatinine Clearance: 45.9 mL/min (A) (by C-G formula based on SCr of 1.84 mg/dL (H)). Liver Function Tests: Recent Labs  Lab 04/19/20 1616 04/20/20 0109  AST 54* 53*  ALT 30 29  ALKPHOS  73 70  BILITOT 5.0* 5.0*  PROT 4.6* 4.7*  ALBUMIN 2.2* 2.3*   No results for input(s): LIPASE, AMYLASE in the last 168 hours. Recent Labs  Lab 04/19/20 1616 04/20/20 0810 04/21/20 0437  AMMONIA 108* 59* 32   Coagulation Profile: Recent Labs  Lab 04/19/20 1616  INR 1.8*   Cardiac Enzymes: No results for input(s): CKTOTAL, CKMB, CKMBINDEX, TROPONINI in the last 168 hours. BNP (last 3 results) No results for input(s): PROBNP in the last 8760 hours. HbA1C: No results for input(s): HGBA1C in the last 72 hours. CBG: No results for input(s): GLUCAP in the last 168 hours. Lipid Profile: No results for input(s): CHOL, HDL, LDLCALC, TRIG, CHOLHDL, LDLDIRECT in the last 72 hours. Thyroid Function Tests: Recent Labs    04/21/20 1214  TSH 0.226*   Anemia Panel: Recent Labs    04/21/20 1214  VITAMINB12 1,354*  FOLATE 6.9   Sepsis Labs: No results for input(s): PROCALCITON, LATICACIDVEN in the last 168 hours.  Recent Results (from the past 240 hour(s))  Respiratory Panel by RT PCR (Flu A&B, Covid) - Nasopharyngeal Swab     Status: None   Collection Time: 04/19/20  4:16 PM    Specimen: Nasopharyngeal Swab  Result Value Ref Range Status   SARS Coronavirus 2 by RT PCR NEGATIVE NEGATIVE Final    Comment: (NOTE) SARS-CoV-2 target nucleic acids are NOT DETECTED.  The SARS-CoV-2 RNA is generally detectable in upper respiratoy specimens during the acute phase of infection. The lowest concentration of SARS-CoV-2 viral copies this assay can detect is 131 copies/mL. A negative result does not preclude SARS-Cov-2 infection and should not be used as the sole basis for treatment or other patient management decisions. A negative result may occur with  improper specimen collection/handling, submission of specimen other than nasopharyngeal swab, presence of viral mutation(s) within the areas targeted by this assay, and inadequate number of viral copies (<131 copies/mL). A negative result must be combined with clinical observations, patient history, and epidemiological information. The expected result is Negative.  Fact Sheet for Patients:  PinkCheek.be  Fact Sheet for Healthcare Providers:  GravelBags.it  This test is no t yet approved or cleared by the [place] FDA and  has been authorized for detection and/or diagnosis of SARS-CoV-2 by FDA under an Emergency Use Authorization (EUA). This EUA will remain  in effect (meaning this test can be used) for the duration of the COVID-19 declaration under Section 564(b)(1) of the Act, 21 U.S.C. section 360bbb-3(b)(1), unless the authorization is terminated or revoked sooner.     Influenza A by PCR NEGATIVE NEGATIVE Final   Influenza B by PCR NEGATIVE NEGATIVE Final    Comment: (NOTE) The Xpert Xpress SARS-CoV-2/FLU/RSV assay is intended as an aid in  the diagnosis of influenza from Nasopharyngeal swab specimens and  should not be used as a sole basis for treatment. Nasal washings and  aspirates are unacceptable for Xpert Xpress SARS-CoV-2/FLU/RSV   testing.  Fact Sheet for Patients: PinkCheek.be  Fact Sheet for Healthcare Providers: GravelBags.it  This test is not yet approved or cleared by the [place] FDA and  has been authorized for detection and/or diagnosis of SARS-CoV-2 by  FDA under an Emergency Use Authorization (EUA). This EUA will remain  in effect (meaning this test can be used) for the duration of the  Covid-19 declaration under Section 564(b)(1) of the Act, 21  U.S.C. section 360bbb-3(b)(1), unless the authorization is  terminated or revoked. Performed at [facility],  [place] [address], [place], [place] [address]          Radiology Studies: US RENAL  Result Date: 04/20/2020 CLINICAL DATA:  Acute kidney injury EXAM: RENAL / URINARY TRACT ULTRASOUND COMPLETE COMPARISON:  Ultrasound 05/20/2017 FINDINGS: Right Kidney: Renal measurements: 10 x 5.8 x 5.6 cm = volume: 169.6 mL. Echogenicity normal. No mass or hydronephrosis. Probable echogenic stone within the mid to lower right kidney measuring 11 mm. Left Kidney: Renal measurements: 10.3 x 5.1 x 4.9 cm = volume: 134.7 mL. Cortical echogenicity is normal. No mass or hydronephrosis. Probable 7 mm echogenic stone within the upper pole. Bladder: Appears normal for degree of bladder distention. Other: None. IMPRESSION: 1. Probable bilateral kidney stones. 2. Negative for hydronephrosis Electronically Signed   By: Donavan Foil M.D.   On: 04/20/2020 21:15   EEG adult  Result Date: 04/21/2020 Lora Havens, MD     04/21/2020  2:19 PM Patient Name: QUAVON KEISLING MRN: [id number] Epilepsy Attending: Lora Havens Referring Physician/Provider: Dr Shelly Coss Date: 04/21/2020 Duration: 22.40 mins Patient history: 69 year old male with altered mental status.  EEG evaluate for seizure. Level of alertness: Awake AEDs during EEG study: None Technical aspects: This EEG study was done with scalp  electrodes positioned according to the 10-20 International system of electrode placement. Electrical activity was acquired at a sampling rate of 500Hz  and reviewed with a high frequency filter of 70Hz  and a low frequency filter of 1Hz . EEG data were recorded continuously and digitally stored. Description: No posterior dominant rhythm seen.  EEG showed continuous generalized 5 to 6 Hz theta slowing as well as intermittent rhythmicgeneralized 2 to 3 Hz delta slowing which at times appeared sharply contoured hyperventilation and photic stimulation were not performed.   ABNORMALITY - Continuous slow, generalized - Intermittent rhythmic delta slow, generalized IMPRESSION: This study is suggestive of moderate diffuse encephalopathy, nonspecific etiology. No seizures or epileptiform discharges were seen throughout the recording. Priyanka Barbra Sarks   US Abdomen Limited RUQ (LIVER/GB)  Result Date: 04/20/2020 CLINICAL DATA:  Acute kidney injury EXAM: ULTRASOUND ABDOMEN LIMITED RIGHT UPPER QUADRANT COMPARISON:  Ultrasound 05/20/2017 FINDINGS: Gallbladder: No obvious shadowing stone.  Normal wall thickness. Common bile duct: Unable to visualize due to bowel gas and habitus. Liver: Echogenicity grossly normal. Unable to adequately visualize the portal vein. Other: None. IMPRESSION: 1. Very limited study secondary to habitus and physical condition of the patient. 2. Common bile and portal vein are not adequately visualized. Electronically Signed   By: Donavan Foil M.D.   On: 04/20/2020 21:18        Scheduled Meds: . sodium chloride   Intravenous Once  . sodium chloride   Intravenous Once  . lactulose  10 g Oral TID  . LORazepam  1 mg Intravenous Once  . pantoprazole  40 mg Oral BID  . potassium chloride  40 mEq Oral Once  . rifaximin  550 mg Oral BID  . spironolactone  50 mg Oral Daily  . sucralfate  1 g Oral TID WC & HS   Continuous Infusions:    LOS: 3 days    Time spent:35 mins. More than 50% of  that time was spent in counseling and/or coordination of care.      Shelly Coss, MD [facility] P10/23/2021, 7:56 AM

## 2020-04-23 DIAGNOSIS — K264 Chronic or unspecified duodenal ulcer with hemorrhage: Secondary | ICD-10-CM | POA: Diagnosis not present

## 2020-04-23 DIAGNOSIS — D62 Acute posthemorrhagic anemia: Secondary | ICD-10-CM | POA: Diagnosis not present

## 2020-04-23 DIAGNOSIS — K7200 Acute and subacute hepatic failure without coma: Secondary | ICD-10-CM | POA: Diagnosis not present

## 2020-04-23 DIAGNOSIS — R42 Dizziness and giddiness: Secondary | ICD-10-CM | POA: Diagnosis not present

## 2020-04-23 LAB — CBC WITH DIFFERENTIAL/PLATELET
Abs Immature Granulocytes: 0.04 10*3/uL (ref 0.00–0.07)
Basophils Absolute: 0 10*3/uL (ref 0.0–0.1)
Basophils Relative: 0 %
Eosinophils Absolute: 0 10*3/uL (ref 0.0–0.5)
Eosinophils Relative: 0 %
HCT: 25.3 % — ABNORMAL LOW (ref 39.0–52.0)
Hemoglobin: 8.3 g/dL — ABNORMAL LOW (ref 13.0–17.0)
Immature Granulocytes: 1 %
Lymphocytes Relative: 18 %
Lymphs Abs: 0.9 10*3/uL (ref 0.7–4.0)
MCH: 32.3 pg (ref 26.0–34.0)
MCHC: 32.8 g/dL (ref 30.0–36.0)
MCV: 98.4 fL (ref 80.0–100.0)
Monocytes Absolute: 0.5 10*3/uL (ref 0.1–1.0)
Monocytes Relative: 10 %
Neutro Abs: 3.4 10*3/uL (ref 1.7–7.7)
Neutrophils Relative %: 71 %
Platelets: 54 10*3/uL — ABNORMAL LOW (ref 150–400)
RBC: 2.57 MIL/uL — ABNORMAL LOW (ref 4.22–5.81)
RDW: 22.5 % — ABNORMAL HIGH (ref 11.5–15.5)
WBC: 4.7 10*3/uL (ref 4.0–10.5)
nRBC: 0 % (ref 0.0–0.2)

## 2020-04-23 LAB — BPAM RBC
Blood Product Expiration Date: 202111172359
Blood Product Expiration Date: 202111212359
Blood Product Expiration Date: 202111222359
Blood Product Expiration Date: 202111232359
ISSUE DATE / TIME: 202110202027
ISSUE DATE / TIME: 202110220736
ISSUE DATE / TIME: 202110230822
ISSUE DATE / TIME: 202110231207
Unit Type and Rh: 5100
Unit Type and Rh: 5100
Unit Type and Rh: 5100
Unit Type and Rh: 5100

## 2020-04-23 LAB — TYPE AND SCREEN
ABO/RH(D): O POS
Antibody Screen: NEGATIVE
Unit division: 0
Unit division: 0
Unit division: 0
Unit division: 0

## 2020-04-23 LAB — HEPATIC FUNCTION PANEL
ALT: 26 U/L (ref 0–44)
AST: 52 U/L — ABNORMAL HIGH (ref 15–41)
Albumin: 3 g/dL — ABNORMAL LOW (ref 3.5–5.0)
Alkaline Phosphatase: 66 U/L (ref 38–126)
Bilirubin, Direct: 2.1 mg/dL — ABNORMAL HIGH (ref 0.0–0.2)
Indirect Bilirubin: 3.9 mg/dL — ABNORMAL HIGH (ref 0.3–0.9)
Total Bilirubin: 6 mg/dL — ABNORMAL HIGH (ref 0.3–1.2)
Total Protein: 4.8 g/dL — ABNORMAL LOW (ref 6.5–8.1)

## 2020-04-23 LAB — BASIC METABOLIC PANEL
Anion gap: 9 (ref 5–15)
BUN: 60 mg/dL — ABNORMAL HIGH (ref 8–23)
CO2: 30 mmol/L (ref 22–32)
Calcium: 8.3 mg/dL — ABNORMAL LOW (ref 8.9–10.3)
Chloride: 99 mmol/L (ref 98–111)
Creatinine, Ser: 1.62 mg/dL — ABNORMAL HIGH (ref 0.61–1.24)
GFR, Estimated: 45 mL/min — ABNORMAL LOW (ref >60)
Glucose, Bld: 129 mg/dL — ABNORMAL HIGH (ref 70–99)
Potassium: 3.3 mmol/L — ABNORMAL LOW (ref 3.5–5.1)
Sodium: 138 mmol/L (ref 135–145)

## 2020-04-23 LAB — PROTIME-INR
INR: 2 — ABNORMAL HIGH (ref 0.8–1.2)
Prothrombin Time: 22.2 seconds — ABNORMAL HIGH (ref 11.4–15.2)

## 2020-04-23 LAB — T3, FREE: T3, Free: 1.5 pg/mL — ABNORMAL LOW (ref 2.0–4.4)

## 2020-04-23 MED ORDER — THIAMINE HCL 100 MG PO TABS
100.0000 mg | ORAL_TABLET | Freq: Every day | ORAL | Status: DC
  Administered 2020-04-23: 100 mg via ORAL
  Filled 2020-04-23: qty 1

## 2020-04-23 MED ORDER — FOLIC ACID 1 MG PO TABS
1.0000 mg | ORAL_TABLET | Freq: Every day | ORAL | Status: DC
  Administered 2020-04-23 – 2020-04-26 (×4): 1 mg via ORAL
  Filled 2020-04-23 (×4): qty 1

## 2020-04-23 MED ORDER — POTASSIUM CHLORIDE CRYS ER 20 MEQ PO TBCR
40.0000 meq | EXTENDED_RELEASE_TABLET | Freq: Once | ORAL | Status: AC
  Administered 2020-04-23: 40 meq via ORAL
  Filled 2020-04-23: qty 2

## 2020-04-23 MED ORDER — PHYTONADIONE 5 MG PO TABS
5.0000 mg | ORAL_TABLET | Freq: Once | ORAL | Status: AC
  Administered 2020-04-23: 5 mg via ORAL
  Filled 2020-04-23: qty 1

## 2020-04-23 NOTE — TOC Initial Note (Signed)
Transition of Care [facility]) - Initial/Assessment Note    Patient Details  Name: Timothy Mckinney MRN: [id number] Date of Birth: 1950/12/18  Transition of Care [facility]) CM/SW Contact:    Shade Flood, LCSW Phone Number: 04/23/2020, 1:39 PM  Clinical Narrative:                  Pt admitted from home. PT recommending SNF rehab at dc.  MD notes indicate pt with some confusion still at this time. Spoke with pt's wife, Timothy Mckinney, by phone today to discuss dc planning and recommendation for SNF rehab.  Timothy Mckinney is agreeable to SNF referrals. Discussed CMS providers and CMS website to compare facilities. Will refer as requested. Pt will need insurance authorization prior to dc.  TOC will follow.  Expected Discharge Plan: Skilled Nursing Facility Barriers to Discharge: Continued Medical Work up   Patient Goals and CMS Choice Patient states their goals for this hospitalization and ongoing recovery are:: rehab and go home CMS Medicare.gov Compare Post Acute Care list provided to:: Patient Represenative (must comment) (pt's wife Timothy Mckinney) Choice offered to / list presented to : Spouse  Expected Discharge Plan and Services Expected Discharge Plan: [place] In-house Referral: Clinical Social Work   Post Acute Care Choice: [place] Living arrangements for the past 2 months: [place]                                      Prior Living Arrangements/Services Living arrangements for the past 2 months: Single Family Home Lives with:: Spouse Patient language and need for interpreter reviewed:: Yes Do you feel safe going back to the place where you live?: Yes      Need for Family Participation in Patient Care: No (Comment) Care giver support system in place?: Yes (comment)   Criminal Activity/Legal Involvement Pertinent to Current Situation/Hospitalization: No - Comment as needed  Activities of Daily Living Home Assistive Devices/Equipment: Walker  (specify type) ADL Screening (condition at time of admission) Patient's cognitive ability adequate to safely complete daily activities?: No Is the patient deaf or have difficulty hearing?: No Does the patient have difficulty seeing, even when wearing glasses/contacts?: No Does the patient have difficulty concentrating, remembering, or making decisions?: Yes Patient able to express need for assistance with ADLs?: No Does the patient have difficulty dressing or bathing?: Yes Independently performs ADLs?: No Communication: Needs assistance Is this a change from baseline?: Pre-admission baseline Dressing (OT): Needs assistance Is this a change from baseline?: Pre-admission baseline Grooming: Needs assistance Is this a change from baseline?: Pre-admission baseline Feeding: Needs assistance Is this a change from baseline?: Pre-admission baseline Bathing: Needs assistance Is this a change from baseline?: Pre-admission baseline Toileting: Needs assistance Is this a change from baseline?: Pre-admission baseline In/Out Bed: Needs assistance Is this a change from baseline?: Pre-admission baseline Walks in Home: Dependent Is this a change from baseline?: Pre-admission baseline Does the patient have difficulty walking or climbing stairs?: Yes Weakness of Legs: Both Weakness of Arms/Hands: Both  Permission Sought/Granted Permission sought to share information with : Chartered certified accountant granted to share information with : Yes, Verbal Permission Granted     Permission granted to share info w AGENCY: [facility]        Emotional Assessment       Orientation: : Oriented to Self, Oriented to Place Alcohol / Substance Use: Not Applicable Psych Involvement: No (comment)  Admission diagnosis:  Acute hepatic encephalopathy [K72.00] Patient Active Problem List   Diagnosis Date Noted  . Acute hepatic encephalopathy 04/19/2020  . Edema 01/20/2019  . Coagulopathy ([place])  07/06/2018  . Kidney stones 07/06/2018  . Lower extremity edema 04/30/2018  . Folate deficiency 02/25/2018  . Chronic low back pain 02/11/2018  . Alcoholic liver disease ([place]) 07/29/2017  . Other pancytopenia ([place]) 07/16/2017  . PND (post-nasal drip) 06/17/2016  . Generalized anxiety disorder 06/13/2015  . Physical exam 12/09/2014  . Sarcoidosis ([place]) 11/09/2014  . Multiple pulmonary nodules 11/09/2014  . Hilar adenopathy 11/09/2014  . Knee pain   . Pulmonary nodules with adenopathy (since 2001 CT Chest) 10/15/2014  . Obesity (BMI 30-39.9) 08/05/2014  . Hypertension 08/05/2014  . 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 TRANSAMINASES, SERUM, ELEVATED 08/15/2009  . GERD (gastroesophageal reflux disease) 06/12/2009  . NEPHROLITHIASIS, HX OF 06/12/2009  . Hyperuricemia 06/12/2009  . CHEST XRAY, ABNORMAL 06/12/2009   PCP:  [name], MD Pharmacy:   [facility] [phone] [place], [place] [address] [place] [address] Phone: [phone] Fax: [phone]     Social Determinants of Health (SDOH) Interventions    Readmission Risk Interventions No flowsheet data found.

## 2020-04-23 NOTE — Plan of Care (Signed)
  Problem: Health Behavior/Discharge Planning: Goal: Ability to manage health-related needs will improve Outcome: Progressing   Problem: Clinical Measurements: Goal: Ability to maintain clinical measurements within normal limits will improve Outcome: Progressing Goal: Will remain free from infection Outcome: Progressing Goal: Diagnostic test results will improve Outcome: Progressing Goal: Respiratory complications will improve Outcome: Progressing Goal: Cardiovascular complication will be avoided Outcome: Progressing   Problem: Activity: Goal: Risk for activity intolerance will decrease Outcome: Progressing Note: PT/OT consulted    Problem: Nutrition: Goal: Adequate nutrition will be maintained Outcome: Progressing   Problem: Elimination: Goal: Will not experience complications related to bowel motility Outcome: Progressing   Problem: Pain Managment: Goal: General experience of comfort will improve Outcome: Progressing   Problem: Safety: Goal: Ability to remain free from injury will improve Outcome: Progressing   Problem: Skin Integrity: Goal: Risk for impaired skin integrity will decrease Outcome: Progressing

## 2020-04-23 NOTE — Progress Notes (Signed)
Pt has remained quite confused today. Oriented to person, place, but unable to state date, time, and is disoriented to situation. Pt speech is clear but rambling and tangential. Follows simple commands. Very weak. Poor appetite but takes PO fluids well.

## 2020-04-23 NOTE — Progress Notes (Signed)
Pt noted to be in afib HR 80-90's. Confirmed by EKG. BP WNL.  Dr Tawanna Solo aware. States pt not a candidate for anticoagulation and just monitor rate for now. No new orders received.

## 2020-04-23 NOTE — Progress Notes (Signed)
Pt has continuously pulled off condom cath, telemetry, and O2 monitor. Writer educated pt on importance. Pt stated multiple times "I want to kill the man that is keeping me alive." Pt also stated "You ladies need to leave my room so I can die peacefully" Writer oriented patient to reality.

## 2020-04-23 NOTE — Progress Notes (Addendum)
[facility] Progress Note Covering for Dr. [name] and Dr. [name]    CC:   Melena, anemia and cirrhosis     Subjective: He is awake, remains confused. No significant agitation at this time. Nurses at bed side to bathe patient. He denies having any abdominal pain. Eating a solid diet. No N/V. He passed a smear of light golden colored stool. No melena or rectal bleeding. No family at the bedside   Difficult to obtain additional review of systems because of his tangential thought pattern. Objective:   S/P EGD by Dr. [name] 04/21/2020: - Normal  Esophagus  - Gastric antral vascular ectasia without bleeding. - Portal hypertensive gastropathy. Biopsied. - Non-bleeding duodenal ulcers with a clean ulcer base (For[place] Class III).  Vital signs in last 24 hours: Temp:  [97.9 F (36.6 C)-99 F (37.2 C)] 97.9 F (36.6 C) (10/24 0350) Pulse Rate:  [51-74] 72 (10/24 0350) Resp:  [16-18] 18 (10/23 2125) BP: (121-140)/(43-50) 123/50 (10/24 0350) SpO2:  [98 %-100 %] 100 % (10/24 0350) Last BM Date: 04/22/20 General:   Alert 69 year old male in NAD.  Eyes: mild scleral icterus.  Heart: RRR, no murmur.  Pulm: Breath sounds clear throughout.  Abdomen:  Obese, soft, nontender. + BS x 4 quads. No HSM.  + BS x 4 quads.  Extremities:  LEs with 1+ edema.  Neurologic:  Alert to name and place. Moves all extremities. UEs tremulous with slight asterixis. Psych:  Alert and cooperative. Normal mood and affect.  Intake/Output from previous day: 10/23 0701 - 10/24 0700 In: 1684 [P.O.:480; I.V.:500; Blood:704] Out: 1150 [Urine:1150] Intake/Output this shift: No intake/output data recorded.  Lab Results: Recent Labs    04/21/20 0437 04/22/20 0548 04/23/20 0527  WBC 10.9* 4.0 4.7  HGB 6.8* 6.1* 8.3*  HCT 19.9* 18.0* 25.3*  PLT 88* 54* 54*   BMET Recent Labs    04/21/20 0437 04/22/20 0548 04/23/20 0527  NA 139 139 138  K 3.6 3.4* 3.3*  CL 102 100 99  CO2 '27 26 30  ' GLUCOSE  136* 152* 129*  BUN 72* 74* 60*  CREATININE 1.91* 1.84* 1.62*  CALCIUM 8.2* 8.4* 8.3*   LFT Recent Labs    04/23/20 0654  PROT 4.8*  ALBUMIN 3.0*  AST 52*  ALT 26  ALKPHOS 66  BILITOT 6.0*  BILIDIR 2.1*  IBILI 3.9*   PT/INR Recent Labs    04/23/20 0654  LABPROT 22.2*  INR 2.0*   Hepatitis Panel No results for input(s): HEPBSAG, HCVAB, HEPAIGM, HEPBIGM in the last 72 hours.  MR BRAIN WO CONTRAST  Result Date: 04/22/2020 CLINICAL DATA:  Mental status change, persistent or worsening. EXAM: MRI HEAD WITHOUT CONTRAST TECHNIQUE: Multiplanar, multiecho pulse sequences of the brain and surrounding structures were obtained without intravenous contrast. COMPARISON:  CT head 04/19/2020 FINDINGS: Brain: No acute infarction, hemorrhage, hydrocephalus, extra-axial collection or mass lesion. Moderate scattered T2/FLAIR hyperintensities within the white matter, compatible with chronic microvascular ischemic disease. Generalized atrophy with ex vacuo ventricular dilation. Vascular: Major arterial flow voids are maintained at the skull base. Skull and upper cervical spine: Limited evaluation given patient motion on the T1 sagittal sequence without evidence of acute abnormality. Sinuses/Orbits: The sinuses are clear.  Unremarkable orbits. Other: No mastoid effusions. IMPRESSION: 1. No evidence of acute intracranial abnormality. 2. Moderate chronic microvascular ischemic disease and generalized atrophy. Electronically Signed   By: Margaretha Sheffield MD   On: 04/22/2020 17:29   EEG adult  Result Date:  04/21/2020 Lora Havens, MD     04/21/2020  2:19 PM Patient Name: Timothy Mckinney MRN: [id number] Epilepsy Attending: Lora Havens Referring Physician/Provider: Dr Shelly Coss Date: 04/21/2020 Duration: 22.40 mins Patient history: 69 year old male with altered mental status.  EEG evaluate for seizure. Level of alertness: Awake AEDs during EEG study: None Technical aspects: This EEG study was  done with scalp electrodes positioned according to the 10-20 International system of electrode placement. Electrical activity was acquired at a sampling rate of '500Hz'  and reviewed with a high frequency filter of '70Hz'  and a low frequency filter of '1Hz' . EEG data were recorded continuously and digitally stored. Description: No posterior dominant rhythm seen.  EEG showed continuous generalized 5 to 6 Hz theta slowing as well as intermittent rhythmicgeneralized 2 to 3 Hz delta slowing which at times appeared sharply contoured hyperventilation and photic stimulation were not performed.   ABNORMALITY - Continuous slow, generalized - Intermittent rhythmic delta slow, generalized IMPRESSION: This study is suggestive of moderate diffuse encephalopathy, nonspecific etiology. No seizures or epileptiform discharges were seen throughout the recording. Priyanka Barbra Sarks    Assessment / Plan:  70. 69 year old male with decompensated ETOH cirrhosis.  Last alcohol intake was reported 3 months ago. 2 g low-sodium diet Continue diuretics as previously ordered with close monitoring of renal status Continue IV albumin as previously ordered  2. Mildly elevated LFTs, secondary to # 1.  Today Alk phos 66. AST 52. ALT 26. Rising T. Bili 5.0 -> 6.0, Right upper quadrant abdominal sonogram 10/21 resulted in a limited study secondary to his habitus, the common bile duct and portal vein were not adequately visualized.  No obvious gallstones or gallbladder wall thickening.  Normal liver echogenicity. CMP in a.m.  3. UGI bleed and anemia. Admission Hg 6.9 -> Hg 6.1. Transfused 1 unit of PRBCs on 10/20 Hg 7.3 -> 6.8 on 10/22 received 2nd unit of PRBCs -> Hg 6.1. Received 2 more units of PRBCs 10/23. Today Hg 8.3. 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  Biopsies are pending. No overt GI bleeding.  CBC in a.m. Continue  PPI twice daily and Carafate 3 times daily Monitoring patient for active GI bleeding  4.  Acute hepatic encephalopathy.  EEG showed moderate diffuse encephalopathy.  Brain MRI ordered. Continue Xifaxan and Lactulose Monitor neuro status closely Recommend thiamine and folic acid  5. Hypokalemia. K+ 3.3.  -KCL replacement per the hospitalist   6. AKI. Cr. 1.84 -> 1.62. Monitor renal status closely as he remains on spironolactone and furosemide  7. Thrombocytopenia secondary to # 1. PLT 54.   8.  Coagulopathy  secondary to #1.  INR 1.8 -> 2.0. INR in am  Further recommendations per Dr. Loletha Carrow   Principal Problem:   Acute hepatic encephalopathy Active Problems:   GERD (gastroesophageal reflux disease)   Hypertension   Sarcoidosis ([place])   Generalized anxiety disorder   Alcoholic liver disease ([place])     LOS: 4 days   Noralyn Pick  04/23/2020, 10:02 AM  I have discussed the case with the PA, and that is the plan I formulated. I personally interviewed and examined the patient.  Still confused. No recurrence of overt GI bleeding since upper endoscopy with Dr. [name] 2 days ago. Hemoglobin remained stable. INR was up a little today, so I ordered 5 mg vitamin K dose Chronic thrombocytopenia Renal function stable  Continuing treatment for hepatic encephalopathy.  Dr. [name] to return tomorrow, I will give him signout on this patient tonight.  Nelida Meuse III Office: [phone]

## 2020-04-23 NOTE — NC FL2 (Signed)
[place] LEVEL OF CARE SCREENING TOOL     IDENTIFICATION  Patient Name: Timothy Mckinney Birthdate: 28-Oct-1950 Sex: male Admission Date (Current Location): 04/19/2020  [facility] and [place] Number:  Herbalist and Address:  [facility],  [place] [address] [place]      Provider Number: [id number]  Attending Physician Name and Address:  Shelly Coss, MD  Relative Name and Phone Number:       Current Level of Care:   Recommended Level of Care: [place] Prior Approval Number:    Date Approved/Denied:   PASRR Number: [id number]  Discharge Plan: SNF    Current Diagnoses: Patient Active Problem List   Diagnosis Date Noted  . Acute hepatic encephalopathy 04/19/2020  . Edema 01/20/2019  . Coagulopathy ([place]) 07/06/2018  . Kidney stones 07/06/2018  . Lower extremity edema 04/30/2018  . Folate deficiency 02/25/2018  . Chronic low back pain 02/11/2018  . Alcoholic liver disease ([place]) 07/29/2017  . Other pancytopenia ([place]) 07/16/2017  . PND (post-nasal drip) 06/17/2016  . Generalized anxiety disorder 06/13/2015  . Physical exam 12/09/2014  . Sarcoidosis ([place]) 11/09/2014  . Multiple pulmonary nodules 11/09/2014  . Hilar adenopathy 11/09/2014  . Knee pain   . Pulmonary nodules with adenopathy (since 2001 CT Chest) 10/15/2014  . Obesity (BMI 30-39.9) 08/05/2014  . Hypertension 08/05/2014  . 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 TRANSAMINASES, SERUM, ELEVATED 08/15/2009  . GERD (gastroesophageal reflux disease) 06/12/2009  . NEPHROLITHIASIS, HX OF 06/12/2009  . Hyperuricemia 06/12/2009  . CHEST XRAY, ABNORMAL 06/12/2009    Orientation RESPIRATION BLADDER Height & Weight     Self, Place    Incontinent Weight: 236 lb 15.9 oz (107.5 kg) Height:  5\' 11"  (180.3 cm)  BEHAVIORAL SYMPTOMS/MOOD NEUROLOGICAL BOWEL NUTRITION STATUS      Continent  Diet (see dc summary)  AMBULATORY STATUS COMMUNICATION OF NEEDS Skin   Extensive Assist Verbally                         Personal Care Assistance Level of Assistance  Bathing, Feeding, Dressing Bathing Assistance: Limited assistance Feeding assistance: Independent Dressing Assistance: Limited assistance     Functional Limitations Info  Sight, Hearing, Speech Sight Info: Adequate Hearing Info: Adequate Speech Info: Adequate    SPECIAL CARE FACTORS FREQUENCY  PT (By licensed PT), OT (By licensed OT)     PT Frequency: 5x week OT Frequency: 3x week            Contractures Contractures Info: Not present    Additional Factors Info  Code Status, Allergies Code Status Info: Full Allergies Info: Penicillins           Current Medications (04/23/2020):  This is the current hospital active medication list Current Facility-Administered Medications  Medication Dose Route Frequency Provider Last Rate Last Admin  . 0.9 %  sodium chloride infusion (Manually program via Guardrails IV Fluids)   Intravenous Once Carol Ada, MD   Held at 04/19/20 1923  . folic acid (FOLVITE) tablet 1 mg  1 mg Oral Daily Adhikari, Amrit, MD   1 mg at 04/23/20 0951  . furosemide (LASIX) injection 80 mg  80 mg Intravenous Q12H Shelly Coss, MD   80 mg at 04/23/20 0943  . lactulose (CHRONULAC) 10 GM/15ML solution 10 g  10 g Oral TID Shelly Coss, MD   10 g  at 04/23/20 0943  . ondansetron (ZOFRAN) tablet 4 mg  4 mg Oral Q6H PRN Carol Ada, MD       Or  . ondansetron [facility]) injection 4 mg  4 mg Intravenous Q6H PRN Carol Ada, MD   4 mg at 04/20/20 1212  . pantoprazole (PROTONIX) EC tablet 40 mg  40 mg Oral BID Shelly Coss, MD   40 mg at 04/23/20 0943  . rifaximin (XIFAXAN) tablet 550 mg  550 mg Oral BID Carol Ada, MD   550 mg at 04/23/20 0943  . spironolactone (ALDACTONE) tablet 50 mg  50 mg Oral Daily Shelly Coss, MD   50 mg at 04/23/20 0943  . sucralfate (CARAFATE) tablet  1 g  1 g Oral TID WC & HS Carol Ada, MD   1 g at 04/23/20 1139  . thiamine tablet 100 mg  100 mg Oral Daily Shelly Coss, MD   100 mg at 04/23/20 0312     Discharge Medications: Please see discharge summary for a list of discharge medications.  Relevant Imaging Results:  Relevant Lab Results:   Additional Information SSN: [id number]  Shade Flood, LCSW

## 2020-04-23 NOTE — Progress Notes (Signed)
PROGRESS NOTE    Timothy Mckinney  WEX:[id number] DOB: Mar 22, 1951 DOA: 04/19/2020 PCP: [name], MD   Chief Complain: Confusion, jaundice  Brief Narrative: Patient is a 69 year old male with history of hypertension, degenerative disc disease, nephrolithiasis, thrombocytopenia, GERD, liver cirrhosis who presents to the emergency room with complaints of confusion, increased jaundice, fall.  Patient was found to be taking presentation, confused.  FOBT was positive, he was anemic on presentation.  Ammonia was found to be elevated, T bili of 5.  Patient was admitted for the management of acute encephalopathy, GI bleed.  GI consulted and he underwent EGD .  Hospital course remarkable for persistent confusion.  Assessment & Plan:   Principal Problem:   Acute hepatic encephalopathy Active Problems:   GERD (gastroesophageal reflux disease)   Hypertension   Sarcoidosis ([place])   Generalized anxiety disorder   Alcoholic liver disease ([place])   Acute  encephalopathy: Hepatic encephalopathy suspected on presentation.  Ammonia was severely elevated on presentation.  Patient was confused.  Not taking lactulose at home. . Ammonia level has improved now.  Continue lactulose, rifaximin.    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 Pending vitamin B1. EEG showed moderate diffuse encephalopathy but no seizures on epileptiform activities.  Suspected GI bleed/macrocytic anemia: Hemoglobin in the range of 6 on presentation.  Status post transfusion with 3 units of  PRBC during this hospitalization. GI consulted,underwent EGD.  On IV Protonix, continue to monitor CBC.  He was also given  octreotide for the concern  of variceal bleed. EGD showed normal esophagus, gastric antral vascular ectasia without bleeding, portal hypertensive  gastropathy, nonbleeding duodenal ulcers with a clean ulcer base.  GI recommended to continue Protonix twice daily for a month then once a day and continues Carafate 3 times a day for a month.  He will follow-up with GI as an outpatient in 2 weeks. Normal LFY10, folic acid  AKI: No history of CKD.Kidney function did not improve with IV fluids. Given  albumin infusion.  He is also severely volume overloaded so started on spironolactone, iv lasix. Low suspicion for hepatorenal syndrome.ultrasound of the kidneys did not show  hydronephrosis or stones.  Continue IV diuresis because he is severely volume overloaded.  Kidney function improving  with diuresis.  History of chronic alcohol abuse: Continue thiamine and folic acid.  Concern  for Wernicke's encephalopathy  Severe protein calorie malnutrition: Albumin in the range of 2. requested dietitian evaluation  Liver cirrhosis: H/O alcoholic liver disease. Last drink was about 3 months ago. Has thrombocytopenia, elevated INR, icterus. liver US showed normal echogenicity.  Pancytopenia: Associated with alcoholic liver disease.  Follows with hematology.  Hypertension: Antihypertensives on hold.  Monitor blood pressure  History of sarcoidosis: Currently stable  GERD: On Protonix  Low TSH: Cud be  euthyroid sick syndrome.  Follow-up free T3, normal freeT4  Debility/deconditioning: PT/OT consulted,recommended SNF  Nutrition Problem: Inadequate oral intake Etiology: nausea, vomiting, lethargy/confusion      DVT prophylaxis:SCD Code Status:Full  Family Communication: called wife  on phone on 10.22.21 Status is: Inpatient  Remains inpatient appropriate because:Inpatient level of care appropriate due to severity of illness   Dispo: The patient is from: Home              Anticipated d/c is to: Home vs SNF  Anticipated d/c date is: 3 days              Patient currently is not medically stable to d/c.    Consultants:  GI  Procedures:None  Antimicrobials:  Anti-infectives (From admission, onward)   Start     Dose/Rate Route Frequency Ordered Stop   04/20/20 1700  rifaximin (XIFAXAN) tablet 550 mg        550 mg Oral 2 times daily 04/20/20 1452     04/19/20 1800  cefTRIAXone (ROCEPHIN) 1 g in sodium chloride 0.9 % 100 mL IVPB        1 g 200 mL/hr over 30 Minutes Intravenous  Once 04/19/20 1752 04/19/20 1921      Subjective:  Patient seen and examined at bedside this morning.  Hemodynamically stable.  He is alert, awake and communicates but he still confused to time.  Anasarca seem to be improving with IV Lasix.    Objective: Vitals:   04/22/20 1228 04/22/20 1501 04/22/20 2125 04/23/20 0350  BP: (!) 140/47 (!) 127/50 (!) 132/45 (!) 123/50  Pulse: 72 74 (!) 51 72  Resp:  18 18   Temp: 99 F (37.2 C) 98.3 F (36.8 C) 98.7 F (37.1 C) 97.9 F (36.6 C)  TempSrc: Axillary Oral Oral Oral  SpO2: 100% 98% 99% 100%  Weight:      Height:        Intake/Output Summary (Last 24 hours) at 04/23/2020 0755 Last data filed at 04/22/2020 1656 Gross per 24 hour  Intake 1684 ml  Output 1150 ml  Net 534 ml   Filed Weights   04/19/20 1530 04/19/20 1540  Weight: 107.5 kg 107.5 kg    Examination:  General exam: Comfortable, confused, chronically ill looking HEENT:PERRL,Oral mucosa moist, Ear/Nose normal on gross exam Respiratory system: No wheezes or crackles Cardiovascular system: S1 & S2 heard, RRR. No JVD, murmurs, rubs, gallops or clicks. Gastrointestinal system: Abdomen is distended, soft and nontender.  Ascites.  No organomegaly or masses felt. Normal bowel sounds heard. Central nervous system: Alert and awake but oriented to place only extremities: 2-3+ bilateral lower extremity pitting edema, no clubbing ,no cyanosis Skin:  icterus ,no ulcers   Data Reviewed: I have personally reviewed following labs and imaging studies  CBC: Recent Labs  Lab 04/19/20 1616 04/19/20 2040  04/20/20 0109 04/20/20 0622 04/21/20 0437 04/22/20 0548 04/23/20 0527  WBC 13.6*   < > 11.7* 9.6 10.9* 4.0 4.7  NEUTROABS 11.4*  --   --   --  8.5* 2.9 3.4  HGB 6.9*   < > 7.3* 7.0* 6.8* 6.1* 8.3*  HCT 20.4*   < > 21.7* 20.3* 19.9* 18.0* 25.3*  MCV 106.8*   < > 106.4* 104.6* 106.4* 102.9* 98.4  PLT 128*   < > 85* 77* 88* 54* 54*   < > = values in this interval not displayed.   Basic Metabolic Panel: Recent Labs  Lab 04/19/20 1616 04/20/20 0109 04/21/20 0437 04/22/20 0548 04/23/20 0527  NA 141 138 139 139 138  K 3.7 3.9 3.6 3.4* 3.3*  CL 103 98 102 100 99  CO2 25 28 27 26 30   GLUCOSE 163* 141* 136* 152* 129*  BUN 56* 66* 72* 74* 60*  CREATININE 1.57* 1.87* 1.91* 1.84* 1.62*  CALCIUM 8.2* 8.5* 8.2* 8.4* 8.3*   GFR: Estimated Creatinine Clearance: 52.2 mL/min (A) (by C-G formula based on SCr of 1.62 mg/dL (H)). Liver Function Tests: Recent Labs  Lab 04/19/20 1616 04/20/20 0109  04/23/20 0654  AST 54* 53* 52*  ALT 30 29 26   ALKPHOS 73 70 66  BILITOT 5.0* 5.0* 6.0*  PROT 4.6* 4.7* 4.8*  ALBUMIN 2.2* 2.3* 3.0*   No results for input(s): LIPASE, AMYLASE in the last 168 hours. Recent Labs  Lab 04/19/20 1616 04/20/20 0810 04/21/20 0437  AMMONIA 108* 59* 32   Coagulation Profile: Recent Labs  Lab 04/19/20 1616 04/23/20 0654  INR 1.8* 2.0*   Cardiac Enzymes: No results for input(s): CKTOTAL, CKMB, CKMBINDEX, TROPONINI in the last 168 hours. BNP (last 3 results) No results for input(s): PROBNP in the last 8760 hours. HbA1C: No results for input(s): HGBA1C in the last 72 hours. CBG: No results for input(s): GLUCAP in the last 168 hours. Lipid Profile: No results for input(s): CHOL, HDL, LDLCALC, TRIG, CHOLHDL, LDLDIRECT in the last 72 hours. Thyroid Function Tests: Recent Labs    04/21/20 1214 04/22/20 0830  TSH 0.226*  --   FREET4  --  0.82   Anemia Panel: Recent Labs    04/21/20 1214  VITAMINB12 1,354*  FOLATE 6.9   Sepsis Labs: No results  for input(s): PROCALCITON, LATICACIDVEN in the last 168 hours.  Recent Results (from the past 240 hour(s))  Respiratory Panel by RT PCR (Flu A&B, Covid) - Nasopharyngeal Swab     Status: None   Collection Time: 04/19/20  4:16 PM   Specimen: Nasopharyngeal Swab  Result Value Ref Range Status   SARS Coronavirus 2 by RT PCR NEGATIVE NEGATIVE Final    Comment: (NOTE) SARS-CoV-2 target nucleic acids are NOT DETECTED.  The SARS-CoV-2 RNA is generally detectable in upper respiratoy specimens during the acute phase of infection. The lowest concentration of SARS-CoV-2 viral copies this assay can detect is 131 copies/mL. A negative result does not preclude SARS-Cov-2 infection and should not be used as the sole basis for treatment or other patient management decisions. A negative result may occur with  improper specimen collection/handling, submission of specimen other than nasopharyngeal swab, presence of viral mutation(s) within the areas targeted by this assay, and inadequate number of viral copies (<131 copies/mL). A negative result must be combined with clinical observations, patient history, and epidemiological information. The expected result is Negative.  Fact Sheet for Patients:  PinkCheek.be  Fact Sheet for Healthcare Providers:  GravelBags.it  This test is no t yet approved or cleared by the [place] FDA and  has been authorized for detection and/or diagnosis of SARS-CoV-2 by FDA under an Emergency Use Authorization (EUA). This EUA will remain  in effect (meaning this test can be used) for the duration of the COVID-19 declaration under Section 564(b)(1) of the Act, 21 U.S.C. section 360bbb-3(b)(1), unless the authorization is terminated or revoked sooner.     Influenza A by PCR NEGATIVE NEGATIVE Final   Influenza B by PCR NEGATIVE NEGATIVE Final    Comment: (NOTE) The Xpert Xpress SARS-CoV-2/FLU/RSV assay is  intended as an aid in  the diagnosis of influenza from Nasopharyngeal swab specimens and  should not be used as a sole basis for treatment. Nasal washings and  aspirates are unacceptable for Xpert Xpress SARS-CoV-2/FLU/RSV  testing.  Fact Sheet for Patients: PinkCheek.be  Fact Sheet for Healthcare Providers: GravelBags.it  This test is not yet approved or cleared by the [place] FDA and  has been authorized for detection and/or diagnosis of SARS-CoV-2 by  FDA under an Emergency Use Authorization (EUA). This EUA will remain  in effect (meaning this test can be used) for  the duration of the  Covid-19 declaration under Section 564(b)(1) of the Act, 21  U.S.C. section 360bbb-3(b)(1), unless the authorization is  terminated or revoked. Performed at [facility], [place] [address] [place], [place] [address]          Radiology Studies: MR BRAIN WO CONTRAST  Result Date: 04/22/2020 CLINICAL DATA:  Mental status change, persistent or worsening. EXAM: MRI HEAD WITHOUT CONTRAST TECHNIQUE: Multiplanar, multiecho pulse sequences of the brain and surrounding structures were obtained without intravenous contrast. COMPARISON:  CT head 04/19/2020 FINDINGS: Brain: No acute infarction, hemorrhage, hydrocephalus, extra-axial collection or mass lesion. Moderate scattered T2/FLAIR hyperintensities within the white matter, compatible with chronic microvascular ischemic disease. Generalized atrophy with ex vacuo ventricular dilation. Vascular: Major arterial flow voids are maintained at the skull base. Skull and upper cervical spine: Limited evaluation given patient motion on the T1 sagittal sequence without evidence of acute abnormality. Sinuses/Orbits: The sinuses are clear.  Unremarkable orbits. Other: No mastoid effusions. IMPRESSION: 1. No evidence of acute intracranial abnormality. 2. Moderate chronic microvascular ischemic  disease and generalized atrophy. Electronically Signed   By: Margaretha Sheffield MD   On: 04/22/2020 17:29   EEG adult  Result Date: 04/21/2020 Lora Havens, MD     04/21/2020  2:19 PM Patient Name: Timothy Mckinney MRN: [id number] Epilepsy Attending: Lora Havens Referring Physician/Provider: Dr Shelly Coss Date: 04/21/2020 Duration: 22.40 mins Patient history: 69 year old male with altered mental status.  EEG evaluate for seizure. Level of alertness: Awake AEDs during EEG study: None Technical aspects: This EEG study was done with scalp electrodes positioned according to the 10-20 International system of electrode placement. Electrical activity was acquired at a sampling rate of 500Hz  and reviewed with a high frequency filter of 70Hz  and a low frequency filter of 1Hz . EEG data were recorded continuously and digitally stored. Description: No posterior dominant rhythm seen.  EEG showed continuous generalized 5 to 6 Hz theta slowing as well as intermittent rhythmicgeneralized 2 to 3 Hz delta slowing which at times appeared sharply contoured hyperventilation and photic stimulation were not performed.   ABNORMALITY - Continuous slow, generalized - Intermittent rhythmic delta slow, generalized IMPRESSION: This study is suggestive of moderate diffuse encephalopathy, nonspecific etiology. No seizures or epileptiform discharges were seen throughout the recording. Priyanka Barbra Sarks        Scheduled Meds: . sodium chloride   Intravenous Once  . furosemide  80 mg Intravenous Q12H  . lactulose  10 g Oral TID  . pantoprazole  40 mg Oral BID  . potassium chloride  40 mEq Oral Once  . rifaximin  550 mg Oral BID  . spironolactone  50 mg Oral Daily  . sucralfate  1 g Oral TID WC & HS   Continuous Infusions:    LOS: 4 days    Time spent:35 mins. More than 50% of that time was spent in counseling and/or coordination of care.      Shelly Coss, MD [facility] P10/24/2021, 7:55 AM  Alease Medina

## 2020-04-24 ENCOUNTER — Encounter (HOSPITAL_COMMUNITY): Payer: Self-pay | Admitting: Gastroenterology

## 2020-04-24 ENCOUNTER — Other Ambulatory Visit: Payer: PPO

## 2020-04-24 ENCOUNTER — Other Ambulatory Visit: Payer: Self-pay

## 2020-04-24 DIAGNOSIS — R42 Dizziness and giddiness: Secondary | ICD-10-CM | POA: Diagnosis not present

## 2020-04-24 DIAGNOSIS — K7200 Acute and subacute hepatic failure without coma: Secondary | ICD-10-CM | POA: Diagnosis not present

## 2020-04-24 LAB — CBC WITH DIFFERENTIAL/PLATELET
Abs Immature Granulocytes: 0.04 10*3/uL (ref 0.00–0.07)
Basophils Absolute: 0 10*3/uL (ref 0.0–0.1)
Basophils Relative: 0 %
Eosinophils Absolute: 0.1 10*3/uL (ref 0.0–0.5)
Eosinophils Relative: 3 %
HCT: 27.5 % — ABNORMAL LOW (ref 39.0–52.0)
Hemoglobin: 9 g/dL — ABNORMAL LOW (ref 13.0–17.0)
Immature Granulocytes: 1 %
Lymphocytes Relative: 22 %
Lymphs Abs: 0.9 10*3/uL (ref 0.7–4.0)
MCH: 32.5 pg (ref 26.0–34.0)
MCHC: 32.7 g/dL (ref 30.0–36.0)
MCV: 99.3 fL (ref 80.0–100.0)
Monocytes Absolute: 0.4 10*3/uL (ref 0.1–1.0)
Monocytes Relative: 10 %
Neutro Abs: 2.8 10*3/uL (ref 1.7–7.7)
Neutrophils Relative %: 64 %
Platelets: 56 10*3/uL — ABNORMAL LOW (ref 150–400)
RBC: 2.77 MIL/uL — ABNORMAL LOW (ref 4.22–5.81)
RDW: 22 % — ABNORMAL HIGH (ref 11.5–15.5)
WBC: 4.3 10*3/uL (ref 4.0–10.5)
nRBC: 0 % (ref 0.0–0.2)

## 2020-04-24 LAB — BASIC METABOLIC PANEL
Anion gap: 8 (ref 5–15)
BUN: 40 mg/dL — ABNORMAL HIGH (ref 8–23)
CO2: 34 mmol/L — ABNORMAL HIGH (ref 22–32)
Calcium: 8.1 mg/dL — ABNORMAL LOW (ref 8.9–10.3)
Chloride: 96 mmol/L — ABNORMAL LOW (ref 98–111)
Creatinine, Ser: 1.26 mg/dL — ABNORMAL HIGH (ref 0.61–1.24)
GFR, Estimated: >60 mL/min (ref >60)
Glucose, Bld: 109 mg/dL — ABNORMAL HIGH (ref 70–99)
Potassium: 3.1 mmol/L — ABNORMAL LOW (ref 3.5–5.1)
Sodium: 138 mmol/L (ref 135–145)

## 2020-04-24 LAB — SURGICAL PATHOLOGY

## 2020-04-24 MED ORDER — POTASSIUM CHLORIDE CRYS ER 20 MEQ PO TBCR
40.0000 meq | EXTENDED_RELEASE_TABLET | ORAL | Status: AC
  Administered 2020-04-24 (×2): 40 meq via ORAL
  Filled 2020-04-24 (×2): qty 2

## 2020-04-24 MED ORDER — FUROSEMIDE 40 MG PO TABS
40.0000 mg | ORAL_TABLET | Freq: Every day | ORAL | Status: DC
  Administered 2020-04-25 – 2020-04-26 (×2): 40 mg via ORAL
  Filled 2020-04-24 (×2): qty 1

## 2020-04-24 MED ORDER — THIAMINE HCL 100 MG/ML IJ SOLN
500.0000 mg | Freq: Every day | INTRAVENOUS | Status: AC
  Administered 2020-04-24 – 2020-04-26 (×3): 500 mg via INTRAVENOUS
  Filled 2020-04-24 (×3): qty 5

## 2020-04-24 NOTE — Progress Notes (Signed)
Pt is not verbally responding to questions this morning. He does follow simple commands and will nod/shake head yes/no. Will not state name, DOB, or answer any questions posed to him. He is pocketing food/water, holding them in his mouth and swishing them around without swallowing even when cued. After several minutes of encouraging pt to swallow, pt was able to spit out into a washcloth when cued. Dr Tawanna Solo aware. SLP aware and working w/ pt presently.

## 2020-04-24 NOTE — TOC Progression Note (Addendum)
Transition of Care [facility]) - Progression Note    Patient Details  Name: Timothy Mckinney MRN: [id number] Date of Birth: 18-Jul-1950  Transition of [facility]) CM/SW Contact  Kourtney Montesinos, Juliann Pulse, RN Phone Number: 04/24/2020, 2:17 PM  Clinical Narrative:  Bed offers given to spouse-Donna. Will await within 24hrs medical stability prior starting auth.  1. 1.7 mi [place] [facility] [place], [place] [address] [phone] Overall rating Below average 2. 2 mi [facility] [place], [place] [address] [phone] Overall rating Below average 3. 2.1 mi [place] [facility] [place], [place] [address] [phone] Overall rating Much below average 4. 2.2 mi [place] [facility] [employer] [place], [place] [address] [phone] Overall rating Much above average 5. 2.2 mi [place], [place] [address] [phone] Overall rating Below average 6. 2.7 [place], [place] [address] [phone] Overall rating Much above average 7. 3.2 mi [place] [address] [place], [place] [address] [phone] Overall rating Below average 8. 3.3 [place] [address] [place], [place] [address] [phone] Overall rating Below average 9. 3.5 mi [facility] [place], [place] [address] [phone] Overall rating Average 10. 4.3 [place], [place] [address] [phone] Overall rating Below average 11. 4.7 mi [place] [facility] [place], [place] [address] [phone] Overall rating Much above average 12. 5.2  mi [facility] [address] [place], [place] [address] [phone] Overall rating Much above average 13. 6.2 mi [place], [place] [address] [phone] Overall rating Average 14. 8.1 [place], [place] [address] [phone] Overall rating Above average 15. 8.1 mi [facility] [place], [place] [address] [phone] Overall rating Much below average   Expected Discharge Plan: [place] Barriers to Discharge: Continued Medical Work up, Ship broker  Expected Discharge Plan and Services Expected Discharge Plan: [place] In-house Referral: Clinical Social Work   Post Acute Care Choice: [place] Living arrangements for the past 2 months: Single Family Home                                       Social Determinants of Health (SDOH) Interventions    Readmission Risk Interventions No flowsheet data found.

## 2020-04-24 NOTE — Care Management Important Message (Signed)
Important Message  Patient Details IM Letter given to the Patient Name: Timothy Mckinney MRN: [id number] Date of Birth: [birth year]   Medicare Important Message Given:  Yes     Kerin Salen 04/24/2020, 12:50 PM

## 2020-04-24 NOTE — Progress Notes (Signed)
Physical Therapy Treatment Patient Details Name: Timothy Mckinney MRN: [id number] DOB: [birth year] Today's Date: 04/24/2020    History of Present Illness 69 yo male admitted with acute hepatic encephalopathy, weakness. Hx of DDD, sarcoidosis, ETOH liver cirrhosis, anxiety.    PT Comments    Patient making gradual progress with Acute therapy. Pt able to follow simple and multi-step cues with increased time for processing. Pt's wife present and encouraged pt to participate. He continues to require mod assist to complete bed mobility and required verbal/visual cues for safe sequencing of sit<>stand with RW and steps for short gait. Close chair follow provided for safety. He will continue to benefit from skilled PT interventions to progress mobility and independence. Acute PT will progress as able.   Follow Up Recommendations  SNF     Equipment Recommendations  None recommended by PT    Recommendations for Other Services       Precautions / Restrictions Precautions Precautions: Fall Precaution Comments: Bruising on R foot Restrictions Weight Bearing Restrictions: No    Mobility  Bed Mobility Overal bed mobility: Needs Assistance Bed Mobility: Supine to Sit     Supine to sit: Mod assist;+2 for safety/equipment;HOB elevated;+2 for physical assistance     General bed mobility comments: simple cues required for LE sequencing to EOB and to use bed rail to assist with raising trunk. Mod assist +2 to raise trunk upright. Pt able to use UE's to scoot to EOB with cues.  Transfers Overall transfer level: Needs assistance Equipment used: Rolling walker (2 wheeled) Transfers: Sit to/from Stand Sit to Stand: Mod assist;+2 safety/equipment;From elevated surface;Min assist         General transfer comment: Mod Assist first attempt to stand from EOB, 2+ for safety. Pt sat back on EOB despite cues to initiate walking. Min Assist to rise second stand from EOB.    Ambulation/Gait Ambulation/Gait assistance: Min assist;+2 safety/equipment Gait Distance (Feet): 6 Feet Assistive device: Rolling walker (2 wheeled) Gait Pattern/deviations: Step-through pattern;Decreased stride length;Decreased step length - left Gait velocity: decr   General Gait Details: Assist for walker position during short steps forward from EOB. Pt required verbal ceus for "Rt" "Lt" steps and visual cues to increased step length. Pt reaching back to chair to sit despite cues to walk towards hallway.    Stairs             Wheelchair Mobility    Modified Rankin (Stroke Patients Only)       Balance Overall balance assessment: Needs assistance;History of Falls Sitting-balance support: Feet supported;No upper extremity supported Sitting balance-Leahy Scale: Fair     Standing balance support: Bilateral upper extremity supported Standing balance-Leahy Scale: Poor                              Cognition Arousal/Alertness: Awake/alert Behavior During Therapy: WFL for tasks assessed/performed Overall Cognitive Status: Impaired/Different from baseline                   Orientation Level: Disoriented to;Time;Situation   Memory: Decreased short-term memory Following Commands: Follows multi-step commands inconsistently;Follows multi-step commands with increased time;Follows one step commands consistently;Follows one step commands with increased time     Problem Solving: Slow processing;Requires verbal cues;Decreased initiation General Comments: pt following more commands this session per RN compared to this AM or yesterday. Pt easily distracted and required redirection to tasks. Pt stating "no" when cued to sit up and to walk,  however then completed each task.       Exercises      General Comments        Pertinent Vitals/Pain Pain Assessment: No/denies pain Faces Pain Scale: No hurt Pain Intervention(s): Monitored during  session;Repositioned    Home Living                      Prior Function            PT Goals (current goals can now be found in the care plan section) Acute Rehab PT Goals Patient Stated Goal: to get better. rehab (per wife) PT Goal Formulation: With patient/family Time For Goal Achievement: 05/06/20 Potential to Achieve Goals: Good Progress towards PT goals: Progressing toward goals    Frequency    Min 3X/week      PT Plan Current plan remains appropriate    Co-evaluation              AM-PAC PT "6 Clicks" Mobility   Outcome Measure  Help needed turning from your back to your side while in a flat bed without using bedrails?: A Lot Help needed moving from lying on your back to sitting on the side of a flat bed without using bedrails?: A Lot Help needed moving to and from a bed to a chair (including a wheelchair)?: A Lot Help needed standing up from a chair using your arms (e.g., wheelchair or bedside chair)?: A Lot Help needed to walk in hospital room?: A Little Help needed climbing 3-5 steps with a railing? : Total 6 Click Score: 12    End of Session Equipment Utilized During Treatment: Gait belt Activity Tolerance: Patient tolerated treatment well Patient left: in chair;with call bell/phone within reach;with chair alarm set;with family/visitor present Nurse Communication: Mobility status PT Visit Diagnosis: Muscle weakness (generalized) (M62.81);History of falling (Z91.81);Other abnormalities of gait and mobility (R26.89)     Time: 0350-0938 PT Time Calculation (min) (ACUTE ONLY): 29 min  Charges:  $Gait Training: 8-22 mins $Therapeutic Activity: 8-22 mins                     Verner Mould, DPT Acute Rehabilitation Services  Office [phone] Pager [phone]  04/24/2020 5:30 PM

## 2020-04-24 NOTE — Progress Notes (Signed)
PROGRESS NOTE    Timothy Mckinney  PYP:[id number] DOB: 1950/09/21 DOA: 04/19/2020 PCP: [name], MD   Chief Complain: Confusion, jaundice  Brief Narrative: Patient is a 69 year old male with history of hypertension, degenerative disc disease, nephrolithiasis, thrombocytopenia, GERD, liver cirrhosis who presents to the emergency room with complaints of confusion, increased jaundice, fall.  Patient was found to be taking presentation, confused.  FOBT was positive, he was anemic on presentation.  Ammonia was found to be elevated, T bili of 5.  Patient was admitted for the management of acute encephalopathy, GI bleed.  GI consulted and he underwent EGD .  Hospital course remarkable for persistent confusion.  PT/OT recommended skilled nursing facility on discharge.  Assessment & Plan:   Principal Problem:   Acute hepatic encephalopathy Active Problems:   GERD (gastroesophageal reflux disease)   Hypertension   Sarcoidosis ([place])   Generalized anxiety disorder   Alcoholic liver disease ([place])   Acute  encephalopathy: Hepatic encephalopathy suspected on presentation.  Ammonia was severely elevated on presentation.  Patient was confused.  Not taking lactulose at hom. . Ammonia level has improved now.  Continue lactulose, rifaximin.    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 Pending vitamin B1. EEG showed moderate diffuse encephalopathy but no seizures on epileptiform activities. Neurology consulted today.  We started on high-dose thiamine for possible thiamine deficiency /Wernicke's encephalopathy  Suspected GI bleed/macrocytic anemia: Hemoglobin in the range of 6 on presentation.  Status post transfusion with 3 units of  PRBC during this hospitalization. GI consulted,underwent EGD.  On IV Protonix, continue to monitor  CBC.  He was also given  octreotide for the concern  of variceal bleed. EGD showed normal esophagus, gastric antral vascular ectasia without bleeding, portal hypertensive gastropathy, nonbleeding duodenal ulcers with a clean ulcer base.  GI recommended to continue Protonix twice daily for a month then once a day and continues Carafate 3 times a day for a month.  He will follow-up with GI as an outpatient in 2 weeks. Normal IWP80, folic acid  AKI: No history of CKD. Given  albumin infusion.  He is also severely volume overloaded so started on spironolactone, iv lasix. Low suspicion for hepatorenal syndrome.ultrasound of the kidneys did not show  hydronephrosis or stones. Kidney function much   with diuresis. Changed  Lasix to oral.  New onset A. fib: Observed during this hospitalization.  Rate is well controlled.  His INR is already elevated secondary to liver cirrhosis and he is not a candidate for any kind of anticoagulation due to his liver disease/high risk of bleeding.  Discussed with his wife about this.  History of chronic alcohol abuse: Continue thiamine and folic acid.  Concern  for Wernicke's encephalopathy  Severe protein calorie malnutrition: Albumin in the range of 2. requested dietitian evaluation  Liver cirrhosis: H/O alcoholic liver disease. Last drink was about 3 months ago. Has thrombocytopenia, elevated INR, icterus. liver US showed normal echogenicity.  Pancytopenia: Associated with alcoholic liver disease.  Follows with hematology.  Hypertension: Antihypertensives on hold.  Monitor blood pressure  History of sarcoidosis: Currently stable  GERD: On Protonix  Low TSH: Cud be  euthyroid sick syndrome.low tsh,  low free T3, normal freeT4  Hypokalemia: Being supplemented with potassium.      Debility/deconditioning: PT/OT consulted,recommended SNF.TOC following  Nutrition Problem: Inadequate oral intake Etiology: nausea, vomiting, lethargy/confusion  DVT  prophylaxis:SCD Code Status:Full  Family Communication: called wife  on phone on 10.25.21 Status is: Inpatient  Remains inpatient appropriate because:Inpatient level of care appropriate due to severity of illness   Dispo: The patient is from: Home              Anticipated d/c is to:  SNF              Anticipated d/c date is: 3 days              Patient currently is not medically stable to d/c.  Needs to complete IV thiamine before discharge.  Consultants: GI  Procedures:None  Antimicrobials:  Anti-infectives (From admission, onward)   Start     Dose/Rate Route Frequency Ordered Stop   04/20/20 1700  rifaximin (XIFAXAN) tablet 550 mg        550 mg Oral 2 times daily 04/20/20 1452     04/19/20 1800  cefTRIAXone (ROCEPHIN) 1 g in sodium chloride 0.9 % 100 mL IVPB        1 g 200 mL/hr over 30 Minutes Intravenous  Once 04/19/20 1752 04/19/20 1921      Subjective: Patient seen and examined at the bedside this morning.  Hemodynamically stable.  Bilateral lower extremity swelling have significantly improved.  He has not talked much today.  Remains confused but comfortable.    Objective: Vitals:   04/23/20 1209 04/23/20 2044 04/24/20 0540 04/24/20 0756  BP: (!) 145/56 (!) 150/60 130/71 131/62  Pulse: 87 68 82 76  Resp: 16 18 18 18   Temp: 98.1 F (36.7 C) 97.8 F (36.6 C)  98.5 F (36.9 C)  TempSrc: Oral Oral  Oral  SpO2: 100% 97% 95% 98%  Weight:      Height:        Intake/Output Summary (Last 24 hours) at 04/24/2020 0759 Last data filed at 04/24/2020 0700 Gross per 24 hour  Intake 960 ml  Output 1200 ml  Net -240 ml   Filed Weights   04/19/20 1530 04/19/20 1540  Weight: 107.5 kg 107.5 kg    Examination:   General exam: Comfortable, chronically ill  looking HEENT:PERRL,Oral mucosa moist, Ear/Nose normal on gross exam Respiratory system: Bilateral equal air entry, normal vesicular breath sounds, no wheezes or crackles  Cardiovascular system: irregularly  irregular Rhythm.. No JVD, murmurs, rubs, gallops or clicks. Gastrointestinal system: Abdomen is nondistended, soft and nontender. No organomegaly or masses felt. Normal bowel sounds heard. Central nervous system: Alert and awake but not  Oriented. Extremities:trace bilateral lower extremity  edema, no clubbing ,no cyanosis Skin: No rashes, lesions or ulcers,no icterus ,no pallor     Data Reviewed: I have personally reviewed following labs and imaging studies  CBC: Recent Labs  Lab 04/19/20 1616 04/19/20 2040 04/20/20 0622 04/21/20 0437 04/22/20 0548 04/23/20 0527 04/24/20 0538  WBC 13.6*   < > 9.6 10.9* 4.0 4.7 4.3  NEUTROABS 11.4*  --   --  8.5* 2.9 3.4 2.8  HGB 6.9*   < > 7.0* 6.8* 6.1* 8.3* 9.0*  HCT 20.4*   < > 20.3* 19.9* 18.0* 25.3* 27.5*  MCV 106.8*   < > 104.6* 106.4* 102.9* 98.4 99.3  PLT 128*   < > 77* 88* 54* 54* 56*   < > = values in this interval not displayed.   Basic Metabolic Panel: Recent Labs  Lab 04/20/20 0109 04/21/20 0437 04/22/20 0548 04/23/20 0527 04/24/20 0538  NA 138 139 139 138 138  K 3.9  3.6 3.4* 3.3* 3.1*  CL 98 102 100 99 96*  CO2 28 27 26 30  34*  GLUCOSE 141* 136* 152* 129* 109*  BUN 66* 72* 74* 60* 40*  CREATININE 1.87* 1.91* 1.84* 1.62* 1.26*  CALCIUM 8.5* 8.2* 8.4* 8.3* 8.1*   GFR: Estimated Creatinine Clearance: 67.1 mL/min (A) (by C-G formula based on SCr of 1.26 mg/dL (H)). Liver Function Tests: Recent Labs  Lab 04/19/20 1616 04/20/20 0109 04/23/20 0654  AST 54* 53* 52*  ALT 30 29 26   ALKPHOS 73 70 66  BILITOT 5.0* 5.0* 6.0*  PROT 4.6* 4.7* 4.8*  ALBUMIN 2.2* 2.3* 3.0*   No results for input(s): LIPASE, AMYLASE in the last 168 hours. Recent Labs  Lab 04/19/20 1616 04/20/20 0810 04/21/20 0437  AMMONIA 108* 59* 32   Coagulation Profile: Recent Labs  Lab 04/19/20 1616 04/23/20 0654  INR 1.8* 2.0*   Cardiac Enzymes: No results for input(s): CKTOTAL, CKMB, CKMBINDEX, TROPONINI in the last 168 hours. BNP (last  3 results) No results for input(s): PROBNP in the last 8760 hours. HbA1C: No results for input(s): HGBA1C in the last 72 hours. CBG: No results for input(s): GLUCAP in the last 168 hours. Lipid Profile: No results for input(s): CHOL, HDL, LDLCALC, TRIG, CHOLHDL, LDLDIRECT in the last 72 hours. Thyroid Function Tests: Recent Labs    04/21/20 1214 04/22/20 0830  TSH 0.226*  --   FREET4  --  0.82  T3FREE  --  1.5*   Anemia Panel: Recent Labs    04/21/20 1214  VITAMINB12 1,354*  FOLATE 6.9   Sepsis Labs: No results for input(s): PROCALCITON, LATICACIDVEN in the last 168 hours.  Recent Results (from the past 240 hour(s))  Respiratory Panel by RT PCR (Flu A&B, Covid) - Nasopharyngeal Swab     Status: None   Collection Time: 04/19/20  4:16 PM   Specimen: Nasopharyngeal Swab  Result Value Ref Range Status   SARS Coronavirus 2 by RT PCR NEGATIVE NEGATIVE Final    Comment: (NOTE) SARS-CoV-2 target nucleic acids are NOT DETECTED.  The SARS-CoV-2 RNA is generally detectable in upper respiratoy specimens during the acute phase of infection. The lowest concentration of SARS-CoV-2 viral copies this assay can detect is 131 copies/mL. A negative result does not preclude SARS-Cov-2 infection and should not be used as the sole basis for treatment or other patient management decisions. A negative result may occur with  improper specimen collection/handling, submission of specimen other than nasopharyngeal swab, presence of viral mutation(s) within the areas targeted by this assay, and inadequate number of viral copies (<131 copies/mL). A negative result must be combined with clinical observations, patient history, and epidemiological information. The expected result is Negative.  Fact Sheet for Patients:  PinkCheek.be  Fact Sheet for Healthcare Providers:  GravelBags.it  This test is no t yet approved or cleared by the [place] FDA and  has been authorized for detection and/or diagnosis of SARS-CoV-2 by FDA under an Emergency Use Authorization (EUA). This EUA will remain  in effect (meaning this test can be used) for the duration of the COVID-19 declaration under Section 564(b)(1) of the Act, 21 U.S.C. section 360bbb-3(b)(1), unless the authorization is terminated or revoked sooner.     Influenza A by PCR NEGATIVE NEGATIVE Final   Influenza B by PCR NEGATIVE NEGATIVE Final    Comment: (NOTE) The Xpert Xpress SARS-CoV-2/FLU/RSV assay is intended as an aid in  the diagnosis of influenza from Nasopharyngeal swab specimens and  should not  be used as a sole basis for treatment. Nasal washings and  aspirates are unacceptable for Xpert Xpress SARS-CoV-2/FLU/RSV  testing.  Fact Sheet for Patients: PinkCheek.be  Fact Sheet for Healthcare Providers: GravelBags.it  This test is not yet approved or cleared by the [place] FDA and  has been authorized for detection and/or diagnosis of SARS-CoV-2 by  FDA under an Emergency Use Authorization (EUA). This EUA will remain  in effect (meaning this test can be used) for the duration of the  Covid-19 declaration under Section 564(b)(1) of the Act, 21  U.S.C. section 360bbb-3(b)(1), unless the authorization is  terminated or revoked. Performed at [facility], [place] [address] [place], [place] [address]          Radiology Studies: MR BRAIN WO CONTRAST  Result Date: 04/22/2020 CLINICAL DATA:  Mental status change, persistent or worsening. EXAM: MRI HEAD WITHOUT CONTRAST TECHNIQUE: Multiplanar, multiecho pulse sequences of the brain and surrounding structures were obtained without intravenous contrast. COMPARISON:  CT head 04/19/2020 FINDINGS: Brain: No acute infarction, hemorrhage, hydrocephalus, extra-axial collection or mass lesion. Moderate scattered T2/FLAIR hyperintensities  within the white matter, compatible with chronic microvascular ischemic disease. Generalized atrophy with ex vacuo ventricular dilation. Vascular: Major arterial flow voids are maintained at the skull base. Skull and upper cervical spine: Limited evaluation given patient motion on the T1 sagittal sequence without evidence of acute abnormality. Sinuses/Orbits: The sinuses are clear.  Unremarkable orbits. Other: No mastoid effusions. IMPRESSION: 1. No evidence of acute intracranial abnormality. 2. Moderate chronic microvascular ischemic disease and generalized atrophy. Electronically Signed   By: Margaretha Sheffield MD   On: 04/22/2020 17:29        Scheduled Meds: . sodium chloride   Intravenous Once  . folic acid  1 mg Oral Daily  . furosemide  80 mg Intravenous Q12H  . lactulose  10 g Oral TID  . pantoprazole  40 mg Oral BID  . potassium chloride  40 mEq Oral Q4H  . rifaximin  550 mg Oral BID  . spironolactone  50 mg Oral Daily  . sucralfate  1 g Oral TID WC & HS  . thiamine  100 mg Oral Daily   Continuous Infusions:    LOS: 5 days    Time spent:35 mins. More than 50% of that time was spent in counseling and/or coordination of care.      Shelly Coss, MD [facility] P10/25/2021, 7:59 AM Alease Medina

## 2020-04-24 NOTE — Consult Note (Addendum)
Neurology Consultation  Reason for Consult: Encephalopathy Referring Physician: Shelly Coss, MD  CC: Encephalopathy  History is obtained from: Chart as patient is nonvocal at this point in time  HPI: Timothy Mckinney is a 69 y.o. male with history of thrombocytopenia, degenerative disc disease, hypertension chronic alcoholism.  Patient patient was initially admitted for altered mental status.  Patient was noted to have new onset confusion the day of admission as well as jaundice.  Per wife patient had stopped drinking alcohol 4 months ago.  When he was drinking he would consume a bottle of vodka every 3 days.    On admission patient's INR was 1.8, BUN was 56, creatinine was 1.57, AST 54 and ALT 30, with total bili 5.0, ammonia significantly raised at 108. Most recent labs show INR elevated now to 2.0, AST 52 with ALT of 26, BUN 40 with creatinine 1.26, TSH 0.226  While patient was in hospital he did receive a EEG which was suggestive of moderate diffuse encephalopathy, nonspecific etiology.  No seizures or epileptiform discharges were seen throughout the recording.  MRI brain without contrast was also obtained showing no evidence of acute intracranial abnormality.  Currently patient is laying in bed staring at the TV is very slow to respond but does follow simple commands only.  Patient is nonverbal.  Appears to be very encephalopathic.   Past Medical History:  Diagnosis Date  . Abnormal chest x-ray    ? granulomata  . Arthritis    knees and back   . DDD (degenerative disc disease), lumbar    MRI 08/2005  . Dry eyes, bilateral    Dr Bing Plume  . Elevated liver enzymes    HISTORY OF ELEVATED LIVER ENZYMES  . GERD (gastroesophageal reflux disease)    hx of   . Hypertension    NEW DIAGNOSIS AND STARTED ON METOPROLOL ON 10/04/11  . Hyperuricemia   . 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 Nephrolithiasis    Dr Serita Butcher  . Sleep apnea    STOP BANG SCORE OF 4  .  Thrombocytopenia ([facility])    HX OF LOW PLATELET COUNT   Family History  Problem Relation Age of Onset  . Heart attack Father 19  . Colon polyps Mother   . Dementia Mother        died post SDH  . Cancer Paternal Grandfather        throat , pipe smoker  . Colon polyps Sister   . Cancer Sister        Colon, stage 2  . Hearing loss Brother   . Stroke Neg Hx    Social History:   reports that he has never smoked. He has never used smokeless tobacco. He reports current alcohol use. He reports that he does not use drugs.  Medications  Current Facility-Administered Medications:  .  0.9 %  sodium chloride infusion (Manually program via Guardrails IV Fluids), , Intravenous, Once, Carol Ada, MD, Held at 04/19/20 1923 .  folic acid (FOLVITE) tablet 1 mg, 1 mg, Oral, Daily, Adhikari, Amrit, MD, 1 mg at 04/24/20 0936 .  lactulose (CHRONULAC) 10 GM/15ML solution 10 g, 10 g, Oral, TID, Adhikari, Amrit, MD, 10 g at 04/24/20 0929 .  ondansetron (ZOFRAN) tablet 4 mg, 4 mg, Oral, Q6H PRN **OR** ondansetron (ZOFRAN) injection 4 mg, 4 mg, Intravenous, Q6H PRN, Carol Ada, MD, 4 mg at 04/20/20 1212 .  pantoprazole (PROTONIX) EC tablet 40 mg, 40 mg, Oral, BID, Adhikari, Amrit, MD, 40 mg at  04/24/20 0936 .  potassium chloride SA (KLOR-CON) CR tablet 40 mEq, 40 mEq, Oral, Q4H, Adhikari, Amrit, MD, 40 mEq at 04/24/20 0930 .  rifaximin (XIFAXAN) tablet 550 mg, 550 mg, Oral, BID, Carol Ada, MD, 550 mg at 04/24/20 0934 .  spironolactone (ALDACTONE) tablet 50 mg, 50 mg, Oral, Daily, Adhikari, Amrit, MD, 50 mg at 04/24/20 0931 .  sucralfate (CARAFATE) tablet 1 g, 1 g, Oral, TID WC & HS, Carol Ada, MD, 1 g at 04/24/20 0744 .  thiamine 500mg  in normal saline (90ml) IVPB, 500 mg, Intravenous, Daily, Adhikari, Amrit, MD  ROS:  Unable to obtain due to patient is nonverbal at this point time.   Exam: Current vital signs: BP (!) 146/52 (BP Location: Right Arm)   Pulse 86   Temp 98.5 F (36.9 C) (Oral)    Resp 18   Ht 5\' 11"  (1.803 m)   Wt 107.5 kg   SpO2 98%   BMI 33.05 kg/m  Vital signs in last 24 hours: Temp:  [97.8 F (36.6 C)-98.5 F (36.9 C)] 98.5 F (36.9 C) (10/25 0756) Pulse Rate:  [68-87] 86 (10/25 0930) Resp:  [16-18] 18 (10/25 0930) BP: (130-150)/(52-71) 146/52 (10/25 0930) SpO2:  [95 %-100 %] 98 % (10/25 0930)   Constitutional: Appears well-developed and well-nourished.  Eyes: No scleral injection HENT: No OP obstrucion Head: Normocephalic.  Cardiovascular: Palpable.  Respiratory: Effort normal, non-labored breathing GI: Soft.  No distension. There is no tenderness.  Skin: WDI-jaundice  Neuro: Mental Status: Patient is awake and alert however he only follows simple commands when he does he is a very late in response.  He is able to show me his thumb, with assistance keep both arms at 90 degrees and follow my fingers. Cranial Nerves: II: Blinks to threat bilaterally III,IV, VI: EOMI without ptosis or diploplia. Pupils equal, round and reactive to light V: Facial sensation is symmetric to temperature VII: Facial movement is symmetric.  VIII: hearing is intact to voice XII: tongue is midline without atrophy or fasciculations.  Motor: Moving all extremities antigravity however has significant tremor but I do not notice any asterixis.  Withdraws to noxious stimuli with good 5/5 strength Sensory: Intact throughout to noxious stimuli Deep Tendon Reflexes: 2+ and symmetric in the biceps and patellae.  Plantars: Toes are downgoing bilaterally.  Cerebellar: FNF shows no dysmetria but does have tremor  Labs I have reviewed labs in epic and the results pertinent to this consultation are:  CBC    Component Value Date/Time   WBC 4.3 04/24/2020 0538   RBC 2.77 (L) 04/24/2020 0538   HGB 9.0 (L) 04/24/2020 0538   HGB 10.4 (L) 02/23/2018 1321   HGB 12.3 (L) 07/04/2017 1532   HCT 27.5 (L) 04/24/2020 0538   HCT 37.0 (L) 07/04/2017 1532   PLT 56 (L) 04/24/2020  0538   PLT 72 (L) 02/23/2018 1321   PLT 56 (L) 07/04/2017 1532   MCV 99.3 04/24/2020 0538   MCV 108.5 (H) 07/04/2017 1532   MCH 32.5 04/24/2020 0538   MCHC 32.7 04/24/2020 0538   RDW 22.0 (H) 04/24/2020 0538   RDW 15.0 (H) 07/04/2017 1532   LYMPHSABS 0.9 04/24/2020 0538   LYMPHSABS 0.9 07/04/2017 1532   MONOABS 0.4 04/24/2020 0538   MONOABS 0.2 07/04/2017 1532   EOSABS 0.1 04/24/2020 0538   EOSABS 0.2 07/04/2017 1532   BASOSABS 0.0 04/24/2020 0538   BASOSABS 0.0 07/04/2017 1532    CMP     Component Value Date/Time  NA 138 04/24/2020 0538   NA 136 07/04/2017 1532   K 3.1 (L) 04/24/2020 0538   K 4.3 07/04/2017 1532   CL 96 (L) 04/24/2020 0538   CO2 34 (H) 04/24/2020 0538   CO2 28 07/04/2017 1532   GLUCOSE 109 (H) 04/24/2020 0538   GLUCOSE 168 (H) 07/04/2017 1532   BUN 40 (H) 04/24/2020 0538   BUN 9.7 07/04/2017 1532   CREATININE 1.26 (H) 04/24/2020 0538   CREATININE 1.04 03/21/2020 1042   CREATININE 0.8 07/04/2017 1532   CALCIUM 8.1 (L) 04/24/2020 0538   CALCIUM 8.5 07/04/2017 1532   PROT 4.8 (L) 04/23/2020 0654   PROT 6.5 07/04/2017 1532   ALBUMIN 3.0 (L) 04/23/2020 0654   ALBUMIN 2.9 (L) 07/04/2017 1532   AST 52 (H) 04/23/2020 0654   AST 54 (H) 02/23/2018 1321   AST 100 (H) 07/04/2017 1532   ALT 26 04/23/2020 0654   ALT 19 02/23/2018 1321   ALT 26 07/04/2017 1532   ALKPHOS 66 04/23/2020 0654   ALKPHOS 125 07/04/2017 1532   BILITOT 6.0 (H) 04/23/2020 0654   BILITOT 3.2 (H) 02/23/2018 1321   BILITOT 4.59 (HH) 07/04/2017 1532   GFRNONAA >60 04/24/2020 0538   GFRNONAA 39 (L) 02/23/2018 1321   GFRAA >60 12/03/2019 1110   GFRAA 45 (L) 02/23/2018 1321    Lipid Panel     Component Value Date/Time   CHOL 177 03/04/2019 1038   TRIG 138.0 03/04/2019 1038   HDL 36.00 (L) 03/04/2019 1038   CHOLHDL 5 03/04/2019 1038   VLDL 27.6 03/04/2019 1038   LDLCALC 113 (H) 03/04/2019 1038     Imaging I have reviewed the images obtained:  CT-scan of the brain-mild  chronic small vessel disease throughout the deep white matter.  No acute intracranial abnormality  MRI examination of the brain-no evidence of acute intracranial abnormality.  Moderate chronic microvascular ischemic disease and generalized atrophy  Etta Quill PA-C [facility] [phone]  M-F  (9:00 am- 5:00 PM)  04/24/2020, 10:00 AM   I have seen the patient reviewed the above note.  His wife reports that he is better than on admission, but not improving as quickly as she would like.  Assessment:  This is a 69 year old male presented to hospital with altered mental status in the setting of multiple electrolyte and metabolic abnormalities, including hyperammonemia, AKI, concern for alcoholism.  I suspect that his improvement will be gradual over the next few weeks.  With negative MRI and EEG, I do not feel that further work-up from a neurological standpoint is likely to be of much benefit.  With his history of alcohol use, I would favor high-dose thiamine repletion with 500 mg IV 3 times daily for 3 days, followed by oral thiamine 100 mg daily   Impression: -Metabolic encephalopathy  Recommendations: -High-dose thiamine followed by continue thiamine supplementation -Continue PT, OT -Continue treatment of hepatic encephalopathy and AKI per internal medicine. -Neurology will be available as needed.  Roland Rack, MD [facility] [phone]  If 7pm- 7am, please page neurology on call as listed in [place].

## 2020-04-24 NOTE — Evaluation (Signed)
Clinical/Bedside Swallow Evaluation Patient Details  Name: Timothy Mckinney MRN: [id number] Date of Birth: 1951-03-04  Today's Date: 04/24/2020 Time: SLP Start Time (ACUTE ONLY): 0911 SLP Stop Time (ACUTE ONLY): 0920 SLP Time Calculation (min) (ACUTE ONLY): 9 min  Past Medical History:  Past Medical History:  Diagnosis Date  . Abnormal chest x-ray    ? granulomata  . Arthritis    knees and back   . DDD (degenerative disc disease), lumbar    MRI 08/2005  . Dry eyes, bilateral    Dr Bing Plume  . Elevated liver enzymes    HISTORY OF ELEVATED LIVER ENZYMES  . GERD (gastroesophageal reflux disease)    hx of   . Hypertension    NEW DIAGNOSIS AND STARTED ON METOPROLOL ON 10/04/11  . Hyperuricemia   . 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 Nephrolithiasis    Dr Serita Butcher  . Sleep apnea    STOP BANG SCORE OF 4  . Thrombocytopenia ([place])    HX OF LOW PLATELET COUNT   Past Surgical History:  Past Surgical History:  Procedure Laterality Date  . ANAL FISTULECTOMY  06/01/2012   Procedure: FISTULECTOMY ANAL;  Surgeon: Leighton Ruff, MD;  Location: [facility];  Service: General;  Laterality: N/A;  Mucosal Advancement Flap   . CARDIAC CATHETERIZATION     in 40s, negative  . colon ulcer  2011  . COLONOSCOPY W/ POLYPECTOMY  2011   Dr Sharlett Iles  . KNEE ARTHROSCOPY  2009    L  . KNEE SURGERY     april 2013 total knee replacement removed  . LITHOTRIPSY    . tear duct plugs    . TOTAL KNEE ARTHROPLASTY  2011   Workman's Comp case, Dr Veverly Fells   HPI:  Patient is a 69 year old male with history of hypertension, degenerative disc disease, nephrolithiasis, thrombocytopenia, GERD, liver cirrhosis who presents to the emergency room with complaints of confusion, increased jaundice, fall.  Patient was found to be taking presentation, confused.   Patient was admitted for the management of acute encephalopathy, GI bleed.  GI consulted and he underwent EGD .  Hospital course remarkable for persistent  confusion.  MRI 10/23 with no acute findings.  No chest imaging at this time.   Assessment / Plan / Recommendation Clinical Impression  Pt presents with grossly functional oropharyngeal swallow ability, but does appear to have some cognitive deficits which are impacting swallow initiation.  Today pt tolerated all consistencies trialed and acheived adequate oral clearace.  There was some brief to moderate length oral holding of bolus trials.  Pt seems to benefit from liquid wash to help initiate swallow.  Pt noteded to clear throat x2 during evaluation, but this did not appear to be related to POs.  After initial social greeting pt did not speak to therapist or respond to questions; however, he did follow directions.    Recommend continuing regular texture diet with thin liquid.  Pt may need assistance with feeding.  If cognitive-linguistic evaluation is desired, please place orders for SLP.  SLP Visit Diagnosis: Dysphagia, unspecified (R13.10)    Aspiration Risk  No limitations    Diet Recommendation Regular;Thin liquid   Liquid Administration via: Cup;Straw Medication Administration: Whole meds with puree (crush if needed) Supervision: Staff to assist with self feeding Compensations: Slow rate;Small sips/bites Postural Changes: Seated upright at 90 degrees    Other  Recommendations Oral Care Recommendations: Oral care BID   Follow up Recommendations  (TBD)      Frequency and  Duration min 2x/week  2 weeks       Prognosis Prognosis for Safe Diet Advancement: Good      Swallow Study   General HPI: Patient is a 69 year old male with history of hypertension, degenerative disc disease, nephrolithiasis, thrombocytopenia, GERD, liver cirrhosis who presents to the emergency room with complaints of confusion, increased jaundice, fall.  Patient was found to be taking presentation, confused.   Patient was admitted for the management of acute encephalopathy, GI bleed.  GI consulted and he  underwent EGD .  Hospital course remarkable for persistent confusion.  MRI 10/23 with no acute findings.  No chest imaging at this time. Type of Study: Bedside Swallow Evaluation Previous Swallow Assessment: none Diet Prior to this Study: Regular;Thin liquids Temperature Spikes Noted: No Respiratory Status: Room air History of Recent Intubation: No Behavior/Cognition: Alert;Cooperative Oral Cavity Assessment: Within Functional Limits Oral Care Completed by SLP: No Oral Cavity - Dentition: Adequate natural dentition Self-Feeding Abilities: Needs assist Patient Positioning: Upright in bed Baseline Vocal Quality: Normal Volitional Cough:  (Fair) Volitional Swallow: Able to elicit    Oral/Motor/Sensory Function Overall Oral Motor/Sensory Function: Mild impairment (slight lingual tremor) Facial ROM: Within Functional Limits Facial Symmetry: Within Functional Limits Lingual ROM: Within Functional Limits Lingual Symmetry: Within Functional Limits Lingual Strength: Within Functional Limits Velum: Within Functional Limits Mandible: Within Functional Limits   Ice Chips Ice chips: Not tested   Thin Liquid Thin Liquid: Within functional limits Presentation: Cup;Straw    Nectar Thick Nectar Thick Liquid: Not tested   Honey Thick Honey Thick Liquid: Not tested   Puree Puree: Impaired Oral Phase Functional Implications: Oral holding   Solid     Solid: Within functional limits Presentation:  (SLP fed)      Celedonio Savage, MA, [place] Office: [phone] 04/24/2020,9:34 AM

## 2020-04-25 DIAGNOSIS — R42 Dizziness and giddiness: Secondary | ICD-10-CM | POA: Diagnosis not present

## 2020-04-25 LAB — BASIC METABOLIC PANEL
Anion gap: 12 (ref 5–15)
BUN: 36 mg/dL — ABNORMAL HIGH (ref 8–23)
CO2: 29 mmol/L (ref 22–32)
Calcium: 8.9 mg/dL (ref 8.9–10.3)
Chloride: 96 mmol/L — ABNORMAL LOW (ref 98–111)
Creatinine, Ser: 1.01 mg/dL (ref 0.61–1.24)
GFR, Estimated: >60 mL/min (ref >60)
Glucose, Bld: 173 mg/dL — ABNORMAL HIGH (ref 70–99)
Potassium: 3.8 mmol/L (ref 3.5–5.1)
Sodium: 137 mmol/L (ref 135–145)

## 2020-04-25 LAB — CBC WITH DIFFERENTIAL/PLATELET
Abs Immature Granulocytes: 0.1 10*3/uL — ABNORMAL HIGH (ref 0.00–0.07)
Basophils Absolute: 0 10*3/uL (ref 0.0–0.1)
Basophils Relative: 0 %
Eosinophils Absolute: 0 10*3/uL (ref 0.0–0.5)
Eosinophils Relative: 0 %
HCT: 33 % — ABNORMAL LOW (ref 39.0–52.0)
Hemoglobin: 10.6 g/dL — ABNORMAL LOW (ref 13.0–17.0)
Immature Granulocytes: 1 %
Lymphocytes Relative: 6 %
Lymphs Abs: 0.6 10*3/uL — ABNORMAL LOW (ref 0.7–4.0)
MCH: 32.1 pg (ref 26.0–34.0)
MCHC: 32.1 g/dL (ref 30.0–36.0)
MCV: 100 fL (ref 80.0–100.0)
Monocytes Absolute: 0.6 10*3/uL (ref 0.1–1.0)
Monocytes Relative: 7 %
Neutro Abs: 7.9 10*3/uL — ABNORMAL HIGH (ref 1.7–7.7)
Neutrophils Relative %: 86 %
Platelets: 94 10*3/uL — ABNORMAL LOW (ref 150–400)
RBC: 3.3 MIL/uL — ABNORMAL LOW (ref 4.22–5.81)
RDW: 22.2 % — ABNORMAL HIGH (ref 11.5–15.5)
WBC: 9.2 10*3/uL (ref 4.0–10.5)
nRBC: 0 % (ref 0.0–0.2)

## 2020-04-25 LAB — RESPIRATORY PANEL BY RT PCR (FLU A&B, COVID)
Influenza A by PCR: NEGATIVE
Influenza B by PCR: NEGATIVE
SARS Coronavirus 2 by RT PCR: NEGATIVE

## 2020-04-25 MED ORDER — LACTULOSE 10 GM/15ML PO SOLN
10.0000 g | Freq: Every day | ORAL | Status: DC
  Filled 2020-04-25: qty 15

## 2020-04-25 MED ORDER — INFLUENZA VAC A&B SA ADJ QUAD 0.5 ML IM PRSY
0.5000 mL | PREFILLED_SYRINGE | INTRAMUSCULAR | Status: AC | PRN
  Administered 2020-04-26: 0.5 mL via INTRAMUSCULAR
  Filled 2020-04-25: qty 0.5

## 2020-04-25 NOTE — TOC Progression Note (Addendum)
Transition of Care [facility]) - Progression Note    Patient Details  Name: Timothy Mckinney MRN: [id number] Date of Birth: 1951/01/21  [facility]) CM/SW Contact  Odarius Dines, Juliann Pulse, RN Phone Number: 04/25/2020, 11:41 AM  Clinical Narrative:Per attending patient for d/c in am. Insurance auth initiated w/[employer] for SNF/PTAR-await outcome. covid ordered.Donna(spouse updated).  5p-Received auth from [employer] for SNF [facility][id number] [phone].     Expected Discharge Plan: [place] Barriers to Discharge: Continued Medical Work up, Ship broker  Expected Discharge Plan and Services Expected Discharge Plan: [place] In-house Referral: Clinical Social Work   Post Acute Care Choice: [place] Living arrangements for the past 2 months: Single Family Home                                       Social Determinants of Health (SDOH) Interventions    Readmission Risk Interventions No flowsheet data found.

## 2020-04-25 NOTE — Progress Notes (Signed)
PROGRESS NOTE    Timothy Mckinney  ZHY:[id number] DOB: 08-07-1950 DOA: 04/19/2020 PCP: [name], MD   Chief Complain: Confusion, jaundice  Brief Narrative: Patient is a 69 year old male with history of hypertension, degenerative disc disease, nephrolithiasis, thrombocytopenia, GERD, liver cirrhosis who presents to the emergency room with complaints of confusion, increased jaundice, fall.  Patient was found to be taking presentation, confused.  FOBT was positive, he was anemic on presentation.  Ammonia was found to be elevated, T bili of 5.  Patient was admitted for the management of acute encephalopathy, GI bleed.  GI consulted and he underwent EGD .  Hospital course remarkable for persistent confusion.Neurology was consulted.  PT/OT recommended skilled nursing facility on discharge.  Assessment & Plan:   Principal Problem:   Acute hepatic encephalopathy Active Problems:   GERD (gastroesophageal reflux disease)   Hypertension   Sarcoidosis ([place])   Generalized anxiety disorder   Alcoholic liver disease ([place])   Acute  encephalopathy: Hepatic encephalopathy suspected on presentation.  Ammonia was severely elevated on presentation.  Patient was confused.  Not taking lactulose at hom. . Ammonia level has improved now.  Continue lactulose, rifaximin.    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 Pending vitamin B1. EEG showed moderate diffuse encephalopathy but no seizures on epileptiform activities. Neurology consulted ,recommeded same.  We started on high-dose thiamine for possible thiamine deficiency /Wernicke's encephalopathy.  Suspected GI bleed/macrocytic anemia: Hemoglobin in the range of 6 on presentation.  Status post transfusion with 3 units of  PRBC during this hospitalization. GI consulted,underwent EGD.   On IV Protonix, continue to monitor CBC.  He was also given  octreotide for the concern  of variceal bleed. EGD showed normal esophagus, gastric antral vascular ectasia without bleeding, portal hypertensive gastropathy, nonbleeding duodenal ulcers with a clean ulcer base.  GI recommended to continue Protonix twice daily for a month then once a day and continues Carafate 3 times a day for a month.  He will follow-up with GI as an outpatient in 2 weeks. Normal EXB28, folic acid  AKI: No history of CKD. Given  albumin infusion.  He is also severely volume overloaded so started on spironolactone, iv lasix. Ultrasound of the kidneys did not show  hydronephrosis or stones. Kidney function improved   with diuresis. Changed  Lasix to oral.  New onset paroxysmal A. fib: Observed during this hospitalization.  Rate is well controlled.  His INR is already elevated secondary to liver cirrhosis and he is not a candidate for any kind of anticoagulation due to his liver disease/high risk of bleeding.  Discussed with his wife about this.  History of chronic alcohol abuse: Continue thiamine and folic acid.  Concern  for Wernicke's encephalopathy  Severe protein calorie malnutrition: Albumin in the range of 2. requested dietitian evaluation  Liver cirrhosis: H/O alcoholic liver disease. Last drink was about 3 months ago. Has thrombocytopenia, elevated INR, icterus. liver US showed normal echogenicity.he needs to follow-up with hepatology as an outpatient.  Pancytopenia: Associated with alcoholic liver disease.  Follows with hematology.  Hypertension: Antihypertensives on hold.  Monitor blood pressure  History of sarcoidosis: Currently stable  GERD: On Protonix  Low TSH: Cud be  euthyroid sick syndrome.low tsh,  low free T3, normal freeT4  Hypokalemia:Supplemented with potassium.      Debility/deconditioning: PT/OT consulted,recommended SNF.TOC following  Nutrition Problem: Inadequate oral  intake Etiology:  nausea, vomiting, lethargy/confusion      DVT prophylaxis:SCD Code Status:Full  Family Communication: called wife  on phone on 10.25.21 Status is: Inpatient  Remains inpatient appropriate because:Inpatient level of care appropriate due to severity of illness   Dispo: The patient is from: Home              Anticipated d/c is to:  SNF              Anticipated d/c date is:1 day              Patient currently is not medically stable to d/c.  Needs to complete IV thiamine before discharge.  Consultants: GI  Procedures:None  Antimicrobials:  Anti-infectives (From admission, onward)   Start     Dose/Rate Route Frequency Ordered Stop   04/20/20 1700  rifaximin (XIFAXAN) tablet 550 mg        550 mg Oral 2 times daily 04/20/20 1452     04/19/20 1800  cefTRIAXone (ROCEPHIN) 1 g in sodium chloride 0.9 % 100 mL IVPB        1 g 200 mL/hr over 30 Minutes Intravenous  Once 04/19/20 1752 04/19/20 1921      Subjective: Patient seen and examined at the bedside this morning.  Hemodynamically stable.  He was being cleaned.  Mental status is  seems like yesterday's, did not talk to me.  RN said that he was talking to her.  Follows commands.  Objective: Vitals:   04/24/20 0930 04/24/20 1337 04/24/20 2042 04/25/20 0634  BP: (!) 146/52 (!) 144/48 (!) 159/66 (!) 155/82  Pulse: 86 83 (!) 102 (!) 105  Resp: 18 18 18 18   Temp:  98.8 F (37.1 C) 97.9 F (36.6 C) 98 F (36.7 C)  TempSrc:  Oral Oral Oral  SpO2: 98%  97% 99%  Weight:      Height:        Intake/Output Summary (Last 24 hours) at 04/25/2020 0755 Last data filed at 04/25/2020 6433 Gross per 24 hour  Intake 592.32 ml  Output 100 ml  Net 492.32 ml   Filed Weights   04/19/20 1530 04/19/20 1540  Weight: 107.5 kg 107.5 kg    Examination:    General exam: Comfortable but debilitated, deconditioned and chronically looking Respiratory system: Bilateral equal air entry, normal vesicular breath sounds, no  wheezes or crackles  Cardiovascular system: S1 & S2 heard, RRR. No JVD, murmurs, rubs, gallops or clicks. Gastrointestinal system: Abdomen is nondistended, soft and nontender. No organomegaly or masses felt. Normal bowel sounds heard. Central nervous system: Alert and awake but not oriented. No focal neurological deficits. Extremities: No edema, no clubbing ,no cyanosis Skin: icterus,no ulcers   Data Reviewed: I have personally reviewed following labs and imaging studies  CBC: Recent Labs  Lab 04/21/20 0437 04/22/20 0548 04/23/20 0527 04/24/20 0538 04/25/20 0457  WBC 10.9* 4.0 4.7 4.3 9.2  NEUTROABS 8.5* 2.9 3.4 2.8 7.9*  HGB 6.8* 6.1* 8.3* 9.0* 10.6*  HCT 19.9* 18.0* 25.3* 27.5* 33.0*  MCV 106.4* 102.9* 98.4 99.3 100.0  PLT 88* 54* 54* 56* 94*   Basic Metabolic Panel: Recent Labs  Lab 04/21/20 0437 04/22/20 0548 04/23/20 0527 04/24/20 0538 04/25/20 0457  NA 139 139 138 138 137  K 3.6 3.4* 3.3* 3.1* 3.8  CL 102 100 99 96* 96*  CO2 27 26 30  34* 29  GLUCOSE 136* 152* 129* 109* 173*  BUN 72* 74* 60* 40* 36*  CREATININE 1.91* 1.84* 1.62* 1.26* 1.01  CALCIUM 8.2* 8.4* 8.3* 8.1* 8.9   GFR: Estimated Creatinine Clearance: 83.7 mL/min (by C-G formula based on SCr of 1.01 mg/dL). Liver Function Tests: Recent Labs  Lab 04/19/20 1616 04/20/20 0109 04/23/20 0654  AST 54* 53* 52*  ALT 30 29 26   ALKPHOS 73 70 66  BILITOT 5.0* 5.0* 6.0*  PROT 4.6* 4.7* 4.8*  ALBUMIN 2.2* 2.3* 3.0*   No results for input(s): LIPASE, AMYLASE in the last 168 hours. Recent Labs  Lab 04/19/20 1616 04/20/20 0810 04/21/20 0437  AMMONIA 108* 59* 32   Coagulation Profile: Recent Labs  Lab 04/19/20 1616 04/23/20 0654  INR 1.8* 2.0*   Cardiac Enzymes: No results for input(s): CKTOTAL, CKMB, CKMBINDEX, TROPONINI in the last 168 hours. BNP (last 3 results) No results for input(s): PROBNP in the last 8760 hours. HbA1C: No results for input(s): HGBA1C in the last 72 hours. CBG: No  results for input(s): GLUCAP in the last 168 hours. Lipid Profile: No results for input(s): CHOL, HDL, LDLCALC, TRIG, CHOLHDL, LDLDIRECT in the last 72 hours. Thyroid Function Tests: Recent Labs    04/22/20 0830  FREET4 0.82  T3FREE 1.5*   Anemia Panel: No results for input(s): VITAMINB12, FOLATE, FERRITIN, TIBC, IRON, RETICCTPCT in the last 72 hours. Sepsis Labs: No results for input(s): PROCALCITON, LATICACIDVEN in the last 168 hours.  Recent Results (from the past 240 hour(s))  Respiratory Panel by RT PCR (Flu A&B, Covid) - Nasopharyngeal Swab     Status: None   Collection Time: 04/19/20  4:16 PM   Specimen: Nasopharyngeal Swab  Result Value Ref Range Status   SARS Coronavirus 2 by RT PCR NEGATIVE NEGATIVE Final    Comment: (NOTE) SARS-CoV-2 target nucleic acids are NOT DETECTED.  The SARS-CoV-2 RNA is generally detectable in upper respiratoy specimens during the acute phase of infection. The lowest concentration of SARS-CoV-2 viral copies this assay can detect is 131 copies/mL. A negative result does not preclude SARS-Cov-2 infection and should not be used as the sole basis for treatment or other patient management decisions. A negative result may occur with  improper specimen collection/handling, submission of specimen other than nasopharyngeal swab, presence of viral mutation(s) within the areas targeted by this assay, and inadequate number of viral copies (<131 copies/mL). A negative result must be combined with clinical observations, patient history, and epidemiological information. The expected result is Negative.  Fact Sheet for Patients:  PinkCheek.be  Fact Sheet for Healthcare Providers:  GravelBags.it  This test is no t yet approved or cleared by the [place] FDA and  has been authorized for detection and/or diagnosis of SARS-CoV-2 by FDA under an Emergency Use Authorization (EUA). This EUA will  remain  in effect (meaning this test can be used) for the duration of the COVID-19 declaration under Section 564(b)(1) of the Act, 21 U.S.C. section 360bbb-3(b)(1), unless the authorization is terminated or revoked sooner.     Influenza A by PCR NEGATIVE NEGATIVE Final   Influenza B by PCR NEGATIVE NEGATIVE Final    Comment: (NOTE) The Xpert Xpress SARS-CoV-2/FLU/RSV assay is intended as an aid in  the diagnosis of influenza from Nasopharyngeal swab specimens and  should not be used as a sole basis for treatment. Nasal washings and  aspirates are unacceptable for Xpert Xpress SARS-CoV-2/FLU/RSV  testing.  Fact Sheet for Patients: PinkCheek.be  Fact Sheet for Healthcare Providers: GravelBags.it  This test is not yet approved or cleared by the [place] FDA and  has been authorized for detection and/or  diagnosis of SARS-CoV-2 by  FDA under an Emergency Use Authorization (EUA). This EUA will remain  in effect (meaning this test can be used) for the duration of the  Covid-19 declaration under Section 564(b)(1) of the Act, 21  U.S.C. section 360bbb-3(b)(1), unless the authorization is  terminated or revoked. Performed at [facility], [place] [address], [place],  [address]          Radiology Studies: No results found.      Scheduled Meds: . sodium chloride   Intravenous Once  . folic acid  1 mg Oral Daily  . furosemide  40 mg Oral Daily  . lactulose  10 g Oral TID  . pantoprazole  40 mg Oral BID  . rifaximin  550 mg Oral BID  . spironolactone  50 mg Oral Daily  . sucralfate  1 g Oral TID WC & HS   Continuous Infusions: . thiamine injection Stopped (04/24/20 1047)     LOS: 6 days    Time spent:25 mins. More than 50% of that time was spent in counseling and/or coordination of care.      Shelly Coss, MD [facility] P10/26/2021, 7:55 AM Alease Medina

## 2020-04-26 DIAGNOSIS — D696 Thrombocytopenia, unspecified: Secondary | ICD-10-CM | POA: Diagnosis not present

## 2020-04-26 DIAGNOSIS — K7030 Alcoholic cirrhosis of liver without ascites: Secondary | ICD-10-CM | POA: Diagnosis not present

## 2020-04-26 DIAGNOSIS — H04123 Dry eye syndrome of bilateral lacrimal glands: Secondary | ICD-10-CM | POA: Diagnosis not present

## 2020-04-26 DIAGNOSIS — K922 Gastrointestinal hemorrhage, unspecified: Secondary | ICD-10-CM | POA: Diagnosis not present

## 2020-04-26 DIAGNOSIS — K219 Gastro-esophageal reflux disease without esophagitis: Secondary | ICD-10-CM | POA: Diagnosis not present

## 2020-04-26 DIAGNOSIS — R600 Localized edema: Secondary | ICD-10-CM | POA: Diagnosis not present

## 2020-04-26 DIAGNOSIS — E876 Hypokalemia: Secondary | ICD-10-CM | POA: Diagnosis not present

## 2020-04-26 DIAGNOSIS — K264 Chronic or unspecified duodenal ulcer with hemorrhage: Secondary | ICD-10-CM | POA: Diagnosis not present

## 2020-04-26 DIAGNOSIS — I69398 Other sequelae of cerebral infarction: Secondary | ICD-10-CM | POA: Diagnosis not present

## 2020-04-26 DIAGNOSIS — D61818 Other pancytopenia: Secondary | ICD-10-CM | POA: Diagnosis not present

## 2020-04-26 DIAGNOSIS — M2550 Pain in unspecified joint: Secondary | ICD-10-CM | POA: Diagnosis not present

## 2020-04-26 DIAGNOSIS — Z23 Encounter for immunization: Secondary | ICD-10-CM | POA: Diagnosis not present

## 2020-04-26 DIAGNOSIS — R262 Difficulty in walking, not elsewhere classified: Secondary | ICD-10-CM | POA: Diagnosis not present

## 2020-04-26 DIAGNOSIS — F411 Generalized anxiety disorder: Secondary | ICD-10-CM | POA: Diagnosis not present

## 2020-04-26 DIAGNOSIS — R5381 Other malaise: Secondary | ICD-10-CM | POA: Diagnosis not present

## 2020-04-26 DIAGNOSIS — F1010 Alcohol abuse, uncomplicated: Secondary | ICD-10-CM | POA: Diagnosis not present

## 2020-04-26 DIAGNOSIS — Z7401 Bed confinement status: Secondary | ICD-10-CM | POA: Diagnosis not present

## 2020-04-26 DIAGNOSIS — R2689 Other abnormalities of gait and mobility: Secondary | ICD-10-CM | POA: Diagnosis not present

## 2020-04-26 DIAGNOSIS — E538 Deficiency of other specified B group vitamins: Secondary | ICD-10-CM | POA: Diagnosis not present

## 2020-04-26 DIAGNOSIS — R42 Dizziness and giddiness: Secondary | ICD-10-CM | POA: Diagnosis not present

## 2020-04-26 DIAGNOSIS — M545 Low back pain, unspecified: Secondary | ICD-10-CM | POA: Diagnosis not present

## 2020-04-26 DIAGNOSIS — M5459 Other low back pain: Secondary | ICD-10-CM | POA: Diagnosis not present

## 2020-04-26 DIAGNOSIS — I480 Paroxysmal atrial fibrillation: Secondary | ICD-10-CM | POA: Diagnosis not present

## 2020-04-26 DIAGNOSIS — G8929 Other chronic pain: Secondary | ICD-10-CM | POA: Diagnosis not present

## 2020-04-26 DIAGNOSIS — K7200 Acute and subacute hepatic failure without coma: Secondary | ICD-10-CM | POA: Diagnosis not present

## 2020-04-26 DIAGNOSIS — I10 Essential (primary) hypertension: Secondary | ICD-10-CM | POA: Diagnosis not present

## 2020-04-26 DIAGNOSIS — E43 Unspecified severe protein-calorie malnutrition: Secondary | ICD-10-CM | POA: Diagnosis not present

## 2020-04-26 DIAGNOSIS — R41841 Cognitive communication deficit: Secondary | ICD-10-CM | POA: Diagnosis not present

## 2020-04-26 DIAGNOSIS — M542 Cervicalgia: Secondary | ICD-10-CM | POA: Diagnosis not present

## 2020-04-26 DIAGNOSIS — M6281 Muscle weakness (generalized): Secondary | ICD-10-CM | POA: Diagnosis not present

## 2020-04-26 DIAGNOSIS — K625 Hemorrhage of anus and rectum: Secondary | ICD-10-CM | POA: Diagnosis not present

## 2020-04-26 DIAGNOSIS — D869 Sarcoidosis, unspecified: Secondary | ICD-10-CM | POA: Diagnosis not present

## 2020-04-26 DIAGNOSIS — K7460 Unspecified cirrhosis of liver: Secondary | ICD-10-CM | POA: Diagnosis not present

## 2020-04-26 MED ORDER — FOLIC ACID 1 MG PO TABS
1.0000 mg | ORAL_TABLET | Freq: Every day | ORAL | Status: DC
Start: 2020-04-26 — End: 2020-06-19

## 2020-04-26 MED ORDER — SPIRONOLACTONE 50 MG PO TABS: 50.0000 mg | ORAL_TABLET | Freq: Every day | ORAL | Status: DC

## 2020-04-26 MED ORDER — FUROSEMIDE 40 MG PO TABS
40.0000 mg | ORAL_TABLET | Freq: Every day | ORAL | Status: DC
Start: 2020-04-26 — End: 2020-07-25

## 2020-04-26 MED ORDER — METOPROLOL SUCCINATE ER 25 MG PO TB24
25.0000 mg | ORAL_TABLET | Freq: Every day | ORAL | Status: DC
  Filled 2020-04-26: qty 1

## 2020-04-26 MED ORDER — OXYCODONE HCL 5 MG PO TABS: 5.0000 mg | ORAL_TABLET | Freq: Four times a day (QID) | ORAL | 0 refills | Status: DC | PRN

## 2020-04-26 MED ORDER — OXYCODONE HCL 5 MG PO TABS
5.0000 mg | ORAL_TABLET | Freq: Four times a day (QID) | ORAL | 0 refills | Status: DC | PRN
Start: 2020-04-26 — End: 2020-04-26

## 2020-04-26 MED ORDER — PANTOPRAZOLE SODIUM 40 MG PO TBEC: 40.0000 mg | DELAYED_RELEASE_TABLET | Freq: Two times a day (BID) | ORAL | Status: DC

## 2020-04-26 MED ORDER — POTASSIUM CHLORIDE CRYS ER 20 MEQ PO TBCR
20.0000 meq | EXTENDED_RELEASE_TABLET | Freq: Every day | ORAL | 6 refills | Status: DC
Start: 2020-04-26 — End: 2020-08-11

## 2020-04-26 MED ORDER — RIFAXIMIN 550 MG PO TABS
550.0000 mg | ORAL_TABLET | Freq: Two times a day (BID) | ORAL | Status: DC
Start: 2020-04-26 — End: 2020-06-10

## 2020-04-26 MED ORDER — THIAMINE HCL 100 MG PO TABS: 100.0000 mg | ORAL_TABLET | Freq: Every day | ORAL | Status: DC

## 2020-04-26 MED ORDER — SUCRALFATE 1 G PO TABS
1.0000 g | ORAL_TABLET | Freq: Three times a day (TID) | ORAL | Status: DC
Start: 2020-04-26 — End: 2020-06-29

## 2020-04-26 MED ORDER — LACTULOSE 10 GM/15ML PO SOLN
10.0000 g | Freq: Two times a day (BID) | ORAL | 0 refills | Status: DC
Start: 2020-04-26 — End: 2020-08-11

## 2020-04-26 NOTE — Progress Notes (Signed)
Attempted to call report to [employer].  No answer at nurses' station.   Andre Lefort

## 2020-04-26 NOTE — Progress Notes (Addendum)
Nutrition Follow-up  DOCUMENTATION CODES:   Obesity unspecified  INTERVENTION:  - weigh patient prior to d/c. - recommendations provided to wife at bedside (she was able to write recommendations down in a notebook).  NUTRITION DIAGNOSIS:   Inadequate oral intake related to nausea, vomiting, lethargy/confusion as evidenced by per patient/family report (as per H&P). -ongoing  GOAL:   Patient will meet greater than or equal to 90% of their needs -unmet  MONITOR:   PO intake, Labs, Weight trends  REASON FOR ASSESSMENT:   Consult Assessment of nutrition requirement/status, Poor PO  ASSESSMENT:   69 year old male with history of HTN, degenerative disc disease, nephrolithiasis, thrombocytopenia, sleep apnea,GERD, tremors, liver cirrhosis and ongoing weakness after sustaining a fall a few months ago with injury to neck and back presented with intermittent nausea with vomiting over the past few days, progressive confusion and worsening tremors after another fall at home. Pt noted markedly juandice, decreased hemoglobin with guaiac positive stools and admitted with hepatic encephalopathy and GIB.  He has been eating 0-25% since diet advanced to Regular on 10/22 after EGD. Patient laying in bed and wife at bedside. Patient noted to be a/o x4 but seems to have some ongoing confusion. He, at times, is very slow to respond and he also seems to perceive situations in a skewed manor. Patient is verbally aggressive at times throughout RD visit, including toward his wife.   Able to talk to wife about ways to possibly improve nutrition intake. Patient denies any pain/discomfort with swallowing. He does report not liking the taste of foods provided in the hospital and feels he will eat better at home when he is not receiving conflicting information about what he should and should not eat.   Patient is able to feed himself and ate a full cup of yogurt during the course of RD visit.   He has not been  weighed since admission on 10/20. Weight on that date indicated stable weight since 03/04/19. Mild pitting edema to BUE and moderate pitting edema to BLE documented in the flow sheet.   Wife has been providing patient with protein shakes. Plan is to continue these outpatient at this time given poor intakes of solid foods. Patient has been drinking very well each day but not doing well with food intake.    Labs reviewed; Cl: 96 mmol/l, BUN: 36 mg/dl, AST elevated. Medications reviewed; 1 mg folvite/day, 10 g lactulose/day, 40 mg oral protonix BID, 50 mg aldactone/day, 1 g carafate TID, 500 mg IV thiamine x1 dose/day 10/25-10/27.    NUTRITION - FOCUSED PHYSICAL EXAM:  patient declined.   Diet Order:   Diet Order            Diet - low sodium heart healthy           Diet regular Room service appropriate? No; Fluid consistency: Thin  Diet effective now                 EDUCATION NEEDS:   Education needs have been addressed  Skin:  Skin Assessment: Skin Integrity Issues: Skin Integrity Issues:: Other (Comment) Other: petechiae;L arm; ecchymosis; bilateral abd; buttocks;hip;neck;foot  Last BM:  10/27 (type 7)  Height:   Ht Readings from Last 1 Encounters:  04/19/20 5\' 11"  (1.803 m)    Weight:   Wt Readings from Last 1 Encounters:  04/19/20 107.5 kg    Estimated Nutritional Needs:  Kcal:  8099-8338 Protein:  100-110 Fluid:  >/= 2 L/day  Lori Liew, MS, RD, LDN, CNSC Inpatient Clinical Dietitian RD pager # available in AMION  After hours/weekend pager # available in AMION  

## 2020-04-26 NOTE — Progress Notes (Signed)
Report given to [place] at [facility] .Andre Lefort

## 2020-04-26 NOTE — TOC Transition Note (Signed)
Transition of Care [facility]) - CM/SW Discharge Note   Patient Details  Name: SHAKA CARDIN MRN: [id number] Date of Birth: 16-Jul-1950  [facility]) CM/SW Contact:  Dessa Phi, RN Phone Number: 04/26/2020, 1:07 PM   Clinical Narrative:  D/c today to [facility] rep Juliann Pulse aware. Going to rm#[id number] tel for nsg report [phone]. [facility] called for transport. No further CM needs.     Final next level of care: Skilled Nursing Facility Barriers to Discharge: No Barriers Identified   Patient Goals and CMS Choice Patient states their goals for this hospitalization and ongoing recovery are:: rehab and go home CMS Medicare.gov Compare Post Acute Care list provided to:: Patient Represenative (must comment) (pt's wife [name]) Choice offered to / list presented to : Spouse  Discharge Placement PASRR number recieved: 04/22/20            Patient chooses bed at: [facility] Patient to be transferred to facility by: [place] Name of family member notified: [name] [phone] Patient and family notified of of transfer: 04/26/20  Discharge Plan and Services In-house Referral: Clinical Social Work   Post Acute Care Choice: [place]                               Social Determinants of Health (SDOH) Interventions     Readmission Risk Interventions No flowsheet data found.

## 2020-04-26 NOTE — Progress Notes (Signed)
OT Cancellation Note  Patient Details Name: Timothy Mckinney MRN: [id number] DOB: 1951/06/02   Cancelled Treatment:    Reason Eval/Treat Not Completed: Patient declined, no reason specified. Prolonged time in room to try to encourage patient to participate with therapy. Patient exhibits confusion, slow processing and poor insight into deficits.   Kadarious Dikes L Charmion Hapke 04/26/2020, 12:05 PM

## 2020-04-26 NOTE — Discharge Summary (Signed)
Physician Discharge Summary  Timothy Mckinney OBS:[id number] DOB: Apr 09, 1951 DOA: 04/19/2020  PCP: [name], MD  Admit date: 04/19/2020 Discharge date: 04/26/2020  Admitted From: Home Disposition:  SNF  Discharge Condition:Stable CODE STATUS:FULL Diet recommendation: Heart Healthy   Brief/Interim Summary:  Patient is a 69 year old male with history of hypertension, degenerative disc disease, nephrolithiasis, thrombocytopenia, GERD, liver cirrhosis who presents to the emergency room with complaints of confusion, increased jaundice, fall.  Patient was found to be taking presentation, confused.  FOBT was positive, he was anemic on presentation.  Ammonia was found to be elevated, T bili of 5.  Patient was admitted for the management of acute encephalopathy, GI bleed.  GI consulted and he underwent EGD .  Hospital course remarkable for persistent confusion but his mental status has improved now..Neurology was consulted.  PT/OT recommended skilled nursing facility on discharge.  Hemodynamically stable for discharge today.   Following problems were addressed during his hospitalization:  Acute  encephalopathy: Hepatic encephalopathy suspected on presentation.  Ammonia was severely elevated on presentation.  Patient was confused.  Not taking lactulose at home . Ammonia level has improved now.  Continue lactulose, rifaximin.    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 Pending vitamin B1. EEG showed moderate diffuse encephalopathy but no seizures on epileptiform activities. Neurology consulted ,recommeded same.  We gave 3 doses of  IV thiamine 500 mg daily  for possible thiamine deficiency /Wernicke's encephalopathy.  Continue oral thiamine indefinitely.  Suspected GI bleed/macrocytic anemia: Hemoglobin in the range of 6 on  presentation.  Status post transfusion with 3 units of  PRBC during this hospitalization. GI consulted,underwent EGD.  On IV Protonix, continue to monitor CBC.  He was also given  octreotide for the concern  of variceal bleed. EGD showed normal esophagus, gastric antral vascular ectasia without bleeding, portal hypertensive gastropathy, nonbleeding duodenal ulcers with a clean ulcer base.  GI recommended to continue Protonix twice daily for a month then once a day and continues Carafate 3 times a day for a month.  He should follow-up with GI as an outpatient in 2 weeks. Normal UTM54, folic acid  AKI: Resolved.No history of CKD. Given  albumin infusion.  He is also severely volume overloaded so started on spironolactone, iv lasix. Ultrasound of the kidneys did not show  hydronephrosis or stones. Kidney function improved   with diuresis. Changed  Lasix to oral.  New onset paroxysmal A. fib: Observed during this hospitalization.  Rate is well controlled.  His INR is already elevated secondary to liver cirrhosis and he is not a candidate for any kind of anticoagulation due to his liver disease/high risk of bleeding.  Discussed with his wife about this.Follow up with cardiology as an outpatient  History of chronic alcohol abuse: Continue thiamine and folic acid.  Concern  for Wernicke's encephalopathy  Severe protein calorie malnutrition: Albumin in the range of 2. We requested dietitian evaluation  Liver cirrhosis: H/O alcoholic liver disease. Last drink was about 3 months ago. Has thrombocytopenia, elevated INR, icterus. liver US showed normal echogenicity.he needs to follow-up with hepatology as an outpatient.  Pancytopenia: Associated with alcoholic liver disease.  Follows with hematology.  Hypertension: Continue home meds  History of sarcoidosis: Currently stable  GERD: On Protonix  Low TSH: Cud be  euthyroid sick syndrome.low tsh,  low free T3, normal freeT4.Do a follow-up thyroid  function panel in  6 to 8 weeks.  Hypokalemia:Supplemented with potassium.    Discharge Diagnoses:  Principal Problem:   Acute hepatic encephalopathy Active Problems:   GERD (gastroesophageal reflux disease)   Hypertension   Sarcoidosis ([place])   Generalized anxiety disorder   Alcoholic liver disease (HCC)    Discharge Instructions  Discharge Instructions    Diet - low sodium heart healthy   Complete by: As directed    Discharge instructions   Complete by: As directed    1)Please take prescribed medications as instructed 2)Do a CBC and BMP tests in a week 3)Follow up with gastroenterology/hepatology as an outpatient.   Increase activity slowly   Complete by: As directed      Allergies as of 04/26/2020      Reactions   Penicillins Swelling   Diffuse swelling      Medication List    TAKE these medications   cycloSPORINE 0.05 % ophthalmic emulsion Commonly known as: RESTASIS Place 1 drop into both eyes daily.   fluticasone 50 MCG/ACT nasal spray Commonly known as: FLONASE Place 1 spray into both nostrils daily as needed for allergies or rhinitis.   folic acid 1 MG tablet Commonly known as: FOLVITE Take 1 tablet (1 mg total) by mouth daily.   furosemide 40 MG tablet Commonly known as: LASIX Take 1 tablet (40 mg total) by mouth daily. What changed:   when to take this  reasons to take this   ibuprofen 800 MG tablet Commonly known as: ADVIL Take 800 mg by mouth every 8 (eight) hours as needed for moderate pain.   lactulose 10 GM/15ML solution Commonly known as: CHRONULAC Take 15 mLs (10 g total) by mouth 2 (two) times daily.   levocetirizine 5 MG tablet Commonly known as: XYZAL Take 5 mg by mouth every evening.   metoprolol succinate 25 MG 24 hr tablet Commonly known as: TOPROL-XL TAKE 1 TABLET(25 MG) BY MOUTH DAILY What changed: See the new instructions.   ondansetron 4 MG tablet Commonly known as: ZOFRAN Take 4 mg by mouth every 8 (eight)  hours as needed for nausea or vomiting.   oxyCODONE 5 MG immediate release tablet Commonly known as: Oxy IR/ROXICODONE Take 1 tablet (5 mg total) by mouth every 6 (six) hours as needed for moderate pain.   pantoprazole 40 MG tablet Commonly known as: PROTONIX Take 1 tablet (40 mg total) by mouth 2 (two) times daily.   potassium chloride SA 20 MEQ tablet Commonly known as: KLOR-CON Take 1 tablet (20 mEq total) by mouth daily. What changed:   when to take this  reasons to take this   rifaximin 550 MG Tabs tablet Commonly known as: XIFAXAN Take 1 tablet (550 mg total) by mouth 2 (two) times daily.   spironolactone 50 MG tablet Commonly known as: ALDACTONE Take 1 tablet (50 mg total) by mouth daily.   sucralfate 1 g tablet Commonly known as: CARAFATE Take 1 tablet (1 g total) by mouth 4 (four) times daily -  with meals and at bedtime.   thiamine 100 MG tablet Take 1 tablet (100 mg total) by mouth daily.       Contact information for after-discharge care    Destination    [facility] Preferred SNF .   Service: Skilled Nursing Contact information: [address] [address] [phone]                 Allergies  Allergen Reactions  . Penicillins Swelling  Diffuse swelling    Consultations:  GI,neurology   Procedures/Studies: CT HEAD WO CONTRAST  Result Date: 04/19/2020 CLINICAL DATA:  Confusion, fall a few months ago. EXAM: CT HEAD WITHOUT CONTRAST TECHNIQUE: Contiguous axial images were obtained from the base of the skull through the vertex without intravenous contrast. COMPARISON:  None. FINDINGS: Brain: Mild chronic small vessel disease within the deep white matter. No acute intracranial abnormality. Specifically, no hemorrhage, hydrocephalus, mass lesion, acute infarction, or significant intracranial injury. Vascular: No hyperdense vessel or unexpected calcification. Skull: No acute calvarial abnormality.  Sinuses/Orbits: Visualized paranasal sinuses and mastoids clear. Orbital soft tissues unremarkable. Other: None IMPRESSION: Mild chronic small vessel disease throughout the deep white matter. No acute intracranial abnormality. Electronically Signed   By: Rolm Baptise M.D.   On: 04/19/2020 17:51   MR BRAIN WO CONTRAST  Result Date: 04/22/2020 CLINICAL DATA:  Mental status change, persistent or worsening. EXAM: MRI HEAD WITHOUT CONTRAST TECHNIQUE: Multiplanar, multiecho pulse sequences of the brain and surrounding structures were obtained without intravenous contrast. COMPARISON:  CT head 04/19/2020 FINDINGS: Brain: No acute infarction, hemorrhage, hydrocephalus, extra-axial collection or mass lesion. Moderate scattered T2/FLAIR hyperintensities within the white matter, compatible with chronic microvascular ischemic disease. Generalized atrophy with ex vacuo ventricular dilation. Vascular: Major arterial flow voids are maintained at the skull base. Skull and upper cervical spine: Limited evaluation given patient motion on the T1 sagittal sequence without evidence of acute abnormality. Sinuses/Orbits: The sinuses are clear.  Unremarkable orbits. Other: No mastoid effusions. IMPRESSION: 1. No evidence of acute intracranial abnormality. 2. Moderate chronic microvascular ischemic disease and generalized atrophy. Electronically Signed   By: Margaretha Sheffield MD   On: 04/22/2020 17:29   US RENAL  Result Date: 04/20/2020 CLINICAL DATA:  Acute kidney injury EXAM: RENAL / URINARY TRACT ULTRASOUND COMPLETE COMPARISON:  Ultrasound 05/20/2017 FINDINGS: Right Kidney: Renal measurements: 10 x 5.8 x 5.6 cm = volume: 169.6 mL. Echogenicity normal. No mass or hydronephrosis. Probable echogenic stone within the mid to lower right kidney measuring 11 mm. Left Kidney: Renal measurements: 10.3 x 5.1 x 4.9 cm = volume: 134.7 mL. Cortical echogenicity is normal. No mass or hydronephrosis. Probable 7 mm echogenic stone within the  upper pole. Bladder: Appears normal for degree of bladder distention. Other: None. IMPRESSION: 1. Probable bilateral kidney stones. 2. Negative for hydronephrosis Electronically Signed   By: Donavan Foil M.D.   On: 04/20/2020 21:15   DG Foot Complete Right  Result Date: 04/19/2020 CLINICAL DATA:  Weakness and foot bruising EXAM: RIGHT FOOT COMPLETE - 3+ VIEW COMPARISON:  None. FINDINGS: Generalized soft tissue swelling is noted in the foot and ankle. No acute fracture or dislocation is noted. Calcaneal spurring is seen. IMPRESSION: Soft tissue swelling without acute bony abnormality. Electronically Signed   By: Inez Catalina M.D.   On: 04/19/2020 17:12   EEG adult  Result Date: 04/21/2020 Lora Havens, MD     04/21/2020  2:19 PM Patient Name: YAKIR WENKE MRN: [id number] Epilepsy Attending: Lora Havens Referring Physician/Provider: Dr Shelly Coss Date: 04/21/2020 Duration: 22.40 mins Patient history: 69 year old male with altered mental status.  EEG evaluate for seizure. Level of alertness: Awake AEDs during EEG study: None Technical aspects: This EEG study was done with scalp electrodes positioned according to the 10-20 International system of electrode placement. Electrical activity was acquired at a sampling rate of 500Hz  and reviewed with a high frequency filter of 70Hz  and a low frequency filter of 1Hz . EEG data were recorded  continuously and digitally stored. Description: No posterior dominant rhythm seen.  EEG showed continuous generalized 5 to 6 Hz theta slowing as well as intermittent rhythmicgeneralized 2 to 3 Hz delta slowing which at times appeared sharply contoured hyperventilation and photic stimulation were not performed.   ABNORMALITY - Continuous slow, generalized - Intermittent rhythmic delta slow, generalized IMPRESSION: This study is suggestive of moderate diffuse encephalopathy, nonspecific etiology. No seizures or epileptiform discharges were seen throughout the  recording. Priyanka Barbra Sarks   US Abdomen Limited RUQ (LIVER/GB)  Result Date: 04/20/2020 CLINICAL DATA:  Acute kidney injury EXAM: ULTRASOUND ABDOMEN LIMITED RIGHT UPPER QUADRANT COMPARISON:  Ultrasound 05/20/2017 FINDINGS: Gallbladder: No obvious shadowing stone.  Normal wall thickness. Common bile duct: Unable to visualize due to bowel gas and habitus. Liver: Echogenicity grossly normal. Unable to adequately visualize the portal vein. Other: None. IMPRESSION: 1. Very limited study secondary to habitus and physical condition of the patient. 2. Common bile and portal vein are not adequately visualized. Electronically Signed   By: Donavan Foil M.D.   On: 04/20/2020 21:18       Subjective: Patient seen and examined at the bedside this morning.  Hemodynamically stable for discharge today.  Discharge Exam: Vitals:   04/25/20 2048 04/26/20 0550  BP: (!) 144/50 (!) 130/52  Pulse: (!) 109 (!) 105  Resp: 18 18  Temp: 98 F (36.7 C) 98.1 F (36.7 C)  SpO2: 97% 93%   Vitals:   04/25/20 1333 04/25/20 1600 04/25/20 2048 04/26/20 0550  BP: 128/74  (!) 144/50 (!) 130/52  Pulse: (!) 101  (!) 109 (!) 105  Resp: 16 14 18 18   Temp: 98.6 F (37 C)  98 F (36.7 C) 98.1 F (36.7 C)  TempSrc: Oral     SpO2: 100%  97% 93%  Weight:      Height:        General: Pt is alert, awake, not in acute distress Cardiovascular:  S1/S2 +, no rubs, no gallops Respiratory: CTA bilaterally, no wheezing, no rhonchi Abdominal: Soft, NT, ND, bowel sounds + Extremities: no edema, no cyanosis    The results of significant diagnostics from this hospitalization (including imaging, microbiology, ancillary and laboratory) are listed below for reference.     Microbiology: Recent Results (from the past 240 hour(s))  Respiratory Panel by RT PCR (Flu A&B, Covid) - Nasopharyngeal Swab     Status: None   Collection Time: 04/19/20  4:16 PM   Specimen: Nasopharyngeal Swab  Result Value Ref Range Status   SARS  Coronavirus 2 by RT PCR NEGATIVE NEGATIVE Final    Comment: (NOTE) SARS-CoV-2 target nucleic acids are NOT DETECTED.  The SARS-CoV-2 RNA is generally detectable in upper respiratoy specimens during the acute phase of infection. The lowest concentration of SARS-CoV-2 viral copies this assay can detect is 131 copies/mL. A negative result does not preclude SARS-Cov-2 infection and should not be used as the sole basis for treatment or other patient management decisions. A negative result may occur with  improper specimen collection/handling, submission of specimen other than nasopharyngeal swab, presence of viral mutation(s) within the areas targeted by this assay, and inadequate number of viral copies (<131 copies/mL). A negative result must be combined with clinical observations, patient history, and epidemiological information. The expected result is Negative.  Fact Sheet for Patients:  PinkCheek.be  Fact Sheet for Healthcare Providers:  GravelBags.it  This test is no t yet approved or cleared by the [place] and  has been authorized  for detection and/or diagnosis of SARS-CoV-2 by FDA under an Emergency Use Authorization (EUA). This EUA will remain  in effect (meaning this test can be used) for the duration of the COVID-19 declaration under Section 564(b)(1) of the Act, 21 U.S.C. section 360bbb-3(b)(1), unless the authorization is terminated or revoked sooner.     Influenza A by PCR NEGATIVE NEGATIVE Final   Influenza B by PCR NEGATIVE NEGATIVE Final    Comment: (NOTE) The Xpert Xpress SARS-CoV-2/FLU/RSV assay is intended as an aid in  the diagnosis of influenza from Nasopharyngeal swab specimens and  should not be used as a sole basis for treatment. Nasal washings and  aspirates are unacceptable for Xpert Xpress SARS-CoV-2/FLU/RSV  testing.  Fact Sheet for  Patients: PinkCheek.be  Fact Sheet for Healthcare Providers: GravelBags.it  This test is not yet approved or cleared by the [place] FDA and  has been authorized for detection and/or diagnosis of SARS-CoV-2 by  FDA under an Emergency Use Authorization (EUA). This EUA will remain  in effect (meaning this test can be used) for the duration of the  Covid-19 declaration under Section 564(b)(1) of the Act, 21  U.S.C. section 360bbb-3(b)(1), unless the authorization is  terminated or revoked. Performed at [facility], [place] [address] [place], [place] [address]   Respiratory Panel by RT PCR (Flu A&B, Covid) - Nasopharyngeal Swab     Status: None   Collection Time: 04/25/20  2:03 PM   Specimen: Nasopharyngeal Swab  Result Value Ref Range Status   SARS Coronavirus 2 by RT PCR NEGATIVE NEGATIVE Final    Comment: (NOTE) SARS-CoV-2 target nucleic acids are NOT DETECTED.  The SARS-CoV-2 RNA is generally detectable in upper respiratoy specimens during the acute phase of infection. The lowest concentration of SARS-CoV-2 viral copies this assay can detect is 131 copies/mL. A negative result does not preclude SARS-Cov-2 infection and should not be used as the sole basis for treatment or other patient management decisions. A negative result may occur with  improper specimen collection/handling, submission of specimen other than nasopharyngeal swab, presence of viral mutation(s) within the areas targeted by this assay, and inadequate number of viral copies (<131 copies/mL). A negative result must be combined with clinical observations, patient history, and epidemiological information. The expected result is Negative.  Fact Sheet for Patients:  PinkCheek.be  Fact Sheet for Healthcare Providers:  GravelBags.it  This test is no t yet approved or cleared by  the [place] FDA and  has been authorized for detection and/or diagnosis of SARS-CoV-2 by FDA under an Emergency Use Authorization (EUA). This EUA will remain  in effect (meaning this test can be used) for the duration of the COVID-19 declaration under Section 564(b)(1) of the Act, 21 U.S.C. section 360bbb-3(b)(1), unless the authorization is terminated or revoked sooner.     Influenza A by PCR NEGATIVE NEGATIVE Final   Influenza B by PCR NEGATIVE NEGATIVE Final    Comment: (NOTE) The Xpert Xpress SARS-CoV-2/FLU/RSV assay is intended as an aid in  the diagnosis of influenza from Nasopharyngeal swab specimens and  should not be used as a sole basis for treatment. Nasal washings and  aspirates are unacceptable for Xpert Xpress SARS-CoV-2/FLU/RSV  testing.  Fact Sheet for Patients: PinkCheek.be  Fact Sheet for Healthcare Providers: GravelBags.it  This test is not yet approved or cleared by the [place] FDA and  has been authorized for detection and/or diagnosis of SARS-CoV-2 by  FDA under an Emergency Use Authorization (EUA). This  EUA will remain  in effect (meaning this test can be used) for the duration of the  Covid-19 declaration under Section 564(b)(1) of the Act, 21  U.S.C. section 360bbb-3(b)(1), unless the authorization is  terminated or revoked. Performed at [facility], [place] [address], [place], [place] [address]      Labs: BNP (last 3 results) No results for input(s): BNP in the last 8760 hours. Basic Metabolic Panel: Recent Labs  Lab 04/21/20 0437 04/22/20 0548 04/23/20 0527 04/24/20 0538 04/25/20 0457  NA 139 139 138 138 137  K 3.6 3.4* 3.3* 3.1* 3.8  CL 102 100 99 96* 96*  CO2 27 26 30  34* 29  GLUCOSE 136* 152* 129* 109* 173*  BUN 72* 74* 60* 40* 36*  CREATININE 1.91* 1.84* 1.62* 1.26* 1.01  CALCIUM 8.2* 8.4* 8.3* 8.1* 8.9   Liver Function Tests: Recent Labs  Lab  04/19/20 1616 04/20/20 0109 04/23/20 0654  AST 54* 53* 52*  ALT 30 29 26   ALKPHOS 73 70 66  BILITOT 5.0* 5.0* 6.0*  PROT 4.6* 4.7* 4.8*  ALBUMIN 2.2* 2.3* 3.0*   No results for input(s): LIPASE, AMYLASE in the last 168 hours. Recent Labs  Lab 04/19/20 1616 04/20/20 0810 04/21/20 0437  AMMONIA 108* 59* 32   CBC: Recent Labs  Lab 04/21/20 0437 04/22/20 0548 04/23/20 0527 04/24/20 0538 04/25/20 0457  WBC 10.9* 4.0 4.7 4.3 9.2  NEUTROABS 8.5* 2.9 3.4 2.8 7.9*  HGB 6.8* 6.1* 8.3* 9.0* 10.6*  HCT 19.9* 18.0* 25.3* 27.5* 33.0*  MCV 106.4* 102.9* 98.4 99.3 100.0  PLT 88* 54* 54* 56* 94*   Cardiac Enzymes: No results for input(s): CKTOTAL, CKMB, CKMBINDEX, TROPONINI in the last 168 hours. BNP: Invalid input(s): POCBNP CBG: No results for input(s): GLUCAP in the last 168 hours. D-Dimer No results for input(s): DDIMER in the last 72 hours. Hgb A1c No results for input(s): HGBA1C in the last 72 hours. Lipid Profile No results for input(s): CHOL, HDL, LDLCALC, TRIG, CHOLHDL, LDLDIRECT in the last 72 hours. Thyroid function studies No results for input(s): TSH, T4TOTAL, T3FREE, THYROIDAB in the last 72 hours.  Invalid input(s): FREET3 Anemia work up No results for input(s): VITAMINB12, FOLATE, FERRITIN, TIBC, IRON, RETICCTPCT in the last 72 hours. Urinalysis    Component Value Date/Time   COLORURINE YELLOW 04/19/2020 2230   APPEARANCEUR CLEAR 04/19/2020 2230   LABSPEC 1.015 04/19/2020 2230   PHURINE 5.5 04/19/2020 2230   GLUCOSEU NEGATIVE 04/19/2020 2230   HGBUR NEGATIVE 04/19/2020 2230   BILIRUBINUR NEGATIVE 04/19/2020 2230   KETONESUR NEGATIVE 04/19/2020 2230   PROTEINUR NEGATIVE 04/19/2020 2230   UROBILINOGEN 0.2 10/15/2014 1020   NITRITE NEGATIVE 04/19/2020 2230   LEUKOCYTESUR NEGATIVE 04/19/2020 2230   Sepsis Labs Invalid input(s): PROCALCITONIN,  WBC,  LACTICIDVEN Microbiology Recent Results (from the past 240 hour(s))  Respiratory Panel by RT PCR (Flu  A&B, Covid) - Nasopharyngeal Swab     Status: None   Collection Time: 04/19/20  4:16 PM   Specimen: Nasopharyngeal Swab  Result Value Ref Range Status   SARS Coronavirus 2 by RT PCR NEGATIVE NEGATIVE Final    Comment: (NOTE) SARS-CoV-2 target nucleic acids are NOT DETECTED.  The SARS-CoV-2 RNA is generally detectable in upper respiratoy specimens during the acute phase of infection. The lowest concentration of SARS-CoV-2 viral copies this assay can detect is 131 copies/mL. A negative result does not preclude SARS-Cov-2 infection and should not be used as the sole basis for treatment or other patient  management decisions. A negative result may occur with  improper specimen collection/handling, submission of specimen other than nasopharyngeal swab, presence of viral mutation(s) within the areas targeted by this assay, and inadequate number of viral copies (<131 copies/mL). A negative result must be combined with clinical observations, patient history, and epidemiological information. The expected result is Negative.  Fact Sheet for Patients:  PinkCheek.be  Fact Sheet for Healthcare Providers:  GravelBags.it  This test is no t yet approved or cleared by the [place] FDA and  has been authorized for detection and/or diagnosis of SARS-CoV-2 by FDA under an Emergency Use Authorization (EUA). This EUA will remain  in effect (meaning this test can be used) for the duration of the COVID-19 declaration under Section 564(b)(1) of the Act, 21 U.S.C. section 360bbb-3(b)(1), unless the authorization is terminated or revoked sooner.     Influenza A by PCR NEGATIVE NEGATIVE Final   Influenza B by PCR NEGATIVE NEGATIVE Final    Comment: (NOTE) The Xpert Xpress SARS-CoV-2/FLU/RSV assay is intended as an aid in  the diagnosis of influenza from Nasopharyngeal swab specimens and  should not be used as a sole basis for treatment. Nasal  washings and  aspirates are unacceptable for Xpert Xpress SARS-CoV-2/FLU/RSV  testing.  Fact Sheet for Patients: PinkCheek.be  Fact Sheet for Healthcare Providers: GravelBags.it  This test is not yet approved or cleared by the [place] FDA and  has been authorized for detection and/or diagnosis of SARS-CoV-2 by  FDA under an Emergency Use Authorization (EUA). This EUA will remain  in effect (meaning this test can be used) for the duration of the  Covid-19 declaration under Section 564(b)(1) of the Act, 21  U.S.C. section 360bbb-3(b)(1), unless the authorization is  terminated or revoked. Performed at [facility], [place] [address], [place], [place] [address]   Respiratory Panel by RT PCR (Flu A&B, Covid) - Nasopharyngeal Swab     Status: None   Collection Time: 04/25/20  2:03 PM   Specimen: Nasopharyngeal Swab  Result Value Ref Range Status   SARS Coronavirus 2 by RT PCR NEGATIVE NEGATIVE Final    Comment: (NOTE) SARS-CoV-2 target nucleic acids are NOT DETECTED.  The SARS-CoV-2 RNA is generally detectable in upper respiratoy specimens during the acute phase of infection. The lowest concentration of SARS-CoV-2 viral copies this assay can detect is 131 copies/mL. A negative result does not preclude SARS-Cov-2 infection and should not be used as the sole basis for treatment or other patient management decisions. A negative result may occur with  improper specimen collection/handling, submission of specimen other than nasopharyngeal swab, presence of viral mutation(s) within the areas targeted by this assay, and inadequate number of viral copies (<131 copies/mL). A negative result must be combined with clinical observations, patient history, and epidemiological information. The expected result is Negative.  Fact Sheet for Patients:  PinkCheek.be  Fact Sheet for  Healthcare Providers:  GravelBags.it  This test is no t yet approved or cleared by the [place] FDA and  has been authorized for detection and/or diagnosis of SARS-CoV-2 by FDA under an Emergency Use Authorization (EUA). This EUA will remain  in effect (meaning this test can be used) for the duration of the COVID-19 declaration under Section 564(b)(1) of the Act, 21 U.S.C. section 360bbb-3(b)(1), unless the authorization is terminated or revoked sooner.     Influenza A by PCR NEGATIVE NEGATIVE Final   Influenza B by PCR NEGATIVE NEGATIVE Final    Comment: (NOTE) The  Xpert Xpress SARS-CoV-2/FLU/RSV assay is intended as an aid in  the diagnosis of influenza from Nasopharyngeal swab specimens and  should not be used as a sole basis for treatment. Nasal washings and  aspirates are unacceptable for Xpert Xpress SARS-CoV-2/FLU/RSV  testing.  Fact Sheet for Patients: PinkCheek.be  Fact Sheet for Healthcare Providers: GravelBags.it  This test is not yet approved or cleared by the [place] FDA and  has been authorized for detection and/or diagnosis of SARS-CoV-2 by  FDA under an Emergency Use Authorization (EUA). This EUA will remain  in effect (meaning this test can be used) for the duration of the  Covid-19 declaration under Section 564(b)(1) of the Act, 21  U.S.C. section 360bbb-3(b)(1), unless the authorization is  terminated or revoked. Performed at [facility], [place] [address], [place], [place] [address]     Please note: You were cared for by a hospitalist during your hospital stay. Once you are discharged, your primary care physician will handle any further medical issues. Please note that NO REFILLS for any discharge medications will be authorized once you are discharged, as it is imperative that you return to your primary care physician (or establish a relationship  with a primary care physician if you do not have one) for your post hospital discharge needs so that they can reassess your need for medications and monitor your lab values.    Time coordinating discharge: 40 minutes  SIGNED:   Shelly Coss, MD  [facility] 04/26/2020, 11:08 AM Pager [phone]  If 7PM-7AM, please contact night-coverage [email or web address] Password [email or web address]

## 2020-04-27 DIAGNOSIS — I10 Essential (primary) hypertension: Secondary | ICD-10-CM | POA: Diagnosis not present

## 2020-04-27 DIAGNOSIS — K264 Chronic or unspecified duodenal ulcer with hemorrhage: Secondary | ICD-10-CM | POA: Diagnosis not present

## 2020-04-27 DIAGNOSIS — F1010 Alcohol abuse, uncomplicated: Secondary | ICD-10-CM | POA: Diagnosis not present

## 2020-04-27 DIAGNOSIS — K7030 Alcoholic cirrhosis of liver without ascites: Secondary | ICD-10-CM | POA: Diagnosis not present

## 2020-04-27 LAB — VITAMIN B1: Vitamin B1 (Thiamine): 97.8 nmol/L (ref 66.5–200.0)

## 2020-05-02 DIAGNOSIS — M5459 Other low back pain: Secondary | ICD-10-CM | POA: Diagnosis not present

## 2020-05-02 DIAGNOSIS — I69398 Other sequelae of cerebral infarction: Secondary | ICD-10-CM | POA: Diagnosis not present

## 2020-05-02 DIAGNOSIS — R262 Difficulty in walking, not elsewhere classified: Secondary | ICD-10-CM | POA: Diagnosis not present

## 2020-05-03 ENCOUNTER — Telehealth: Payer: Self-pay | Admitting: Family Medicine

## 2020-05-03 NOTE — Telephone Encounter (Signed)
Patients wife called and said that husband was recently in the hospital for a week and will be in rehab for a couple of weeks.  She is wanting Dr. Birdie Riddle to be aware of all the medication changes and wonders if [facility] will do his labs.  Please advise.

## 2020-05-04 DIAGNOSIS — R600 Localized edema: Secondary | ICD-10-CM | POA: Diagnosis not present

## 2020-05-04 DIAGNOSIS — K7460 Unspecified cirrhosis of liver: Secondary | ICD-10-CM | POA: Diagnosis not present

## 2020-05-04 DIAGNOSIS — K625 Hemorrhage of anus and rectum: Secondary | ICD-10-CM | POA: Diagnosis not present

## 2020-05-04 NOTE — Telephone Encounter (Signed)
Wife of said patient called and said that he has been in the hospital for a couple of weeks She is wanting Dr. Darene Lamer to be aware of changes in meds and wonders if [facility] will do labs. Please advise!

## 2020-05-05 NOTE — Telephone Encounter (Signed)
Call has been addressed.

## 2020-05-05 NOTE — Telephone Encounter (Signed)
Whatever rehab facility he is currently at should be responsible for all of the medications and labs he would need

## 2020-05-11 DIAGNOSIS — M545 Low back pain, unspecified: Secondary | ICD-10-CM | POA: Diagnosis not present

## 2020-05-11 DIAGNOSIS — K7460 Unspecified cirrhosis of liver: Secondary | ICD-10-CM | POA: Diagnosis not present

## 2020-05-11 DIAGNOSIS — M542 Cervicalgia: Secondary | ICD-10-CM | POA: Diagnosis not present

## 2020-05-11 DIAGNOSIS — R600 Localized edema: Secondary | ICD-10-CM | POA: Diagnosis not present

## 2020-05-17 DIAGNOSIS — M6281 Muscle weakness (generalized): Secondary | ICD-10-CM | POA: Diagnosis not present

## 2020-05-17 DIAGNOSIS — R2689 Other abnormalities of gait and mobility: Secondary | ICD-10-CM | POA: Diagnosis not present

## 2020-05-17 DIAGNOSIS — K7030 Alcoholic cirrhosis of liver without ascites: Secondary | ICD-10-CM | POA: Diagnosis not present

## 2020-05-17 DIAGNOSIS — K7200 Acute and subacute hepatic failure without coma: Secondary | ICD-10-CM | POA: Diagnosis not present

## 2020-05-18 ENCOUNTER — Telehealth: Payer: Self-pay | Admitting: Family Medicine

## 2020-05-18 NOTE — Telephone Encounter (Signed)
Patient will need a hospital follow up with [facility] and at that time can discuss medications

## 2020-05-18 NOTE — Telephone Encounter (Signed)
Patient's wife called and said that he has just come out of the hospital and rehab - Patient has all new prescriptions and he would like to know if all of these are necessary.  Please advise.

## 2020-05-21 DIAGNOSIS — K219 Gastro-esophageal reflux disease without esophagitis: Secondary | ICD-10-CM | POA: Diagnosis not present

## 2020-05-21 DIAGNOSIS — D61818 Other pancytopenia: Secondary | ICD-10-CM | POA: Diagnosis not present

## 2020-05-21 DIAGNOSIS — Z7901 Long term (current) use of anticoagulants: Secondary | ICD-10-CM | POA: Diagnosis not present

## 2020-05-21 DIAGNOSIS — I480 Paroxysmal atrial fibrillation: Secondary | ICD-10-CM | POA: Diagnosis not present

## 2020-05-21 DIAGNOSIS — E785 Hyperlipidemia, unspecified: Secondary | ICD-10-CM | POA: Diagnosis not present

## 2020-05-21 DIAGNOSIS — Z9181 History of falling: Secondary | ICD-10-CM | POA: Diagnosis not present

## 2020-05-21 DIAGNOSIS — F411 Generalized anxiety disorder: Secondary | ICD-10-CM | POA: Diagnosis not present

## 2020-05-21 DIAGNOSIS — K7030 Alcoholic cirrhosis of liver without ascites: Secondary | ICD-10-CM | POA: Diagnosis not present

## 2020-05-21 DIAGNOSIS — I10 Essential (primary) hypertension: Secondary | ICD-10-CM | POA: Diagnosis not present

## 2020-05-21 DIAGNOSIS — D696 Thrombocytopenia, unspecified: Secondary | ICD-10-CM | POA: Diagnosis not present

## 2020-05-21 DIAGNOSIS — H04123 Dry eye syndrome of bilateral lacrimal glands: Secondary | ICD-10-CM | POA: Diagnosis not present

## 2020-05-21 DIAGNOSIS — D649 Anemia, unspecified: Secondary | ICD-10-CM | POA: Diagnosis not present

## 2020-05-21 DIAGNOSIS — M1990 Unspecified osteoarthritis, unspecified site: Secondary | ICD-10-CM | POA: Diagnosis not present

## 2020-05-21 DIAGNOSIS — Z791 Long term (current) use of non-steroidal anti-inflammatories (NSAID): Secondary | ICD-10-CM | POA: Diagnosis not present

## 2020-05-21 DIAGNOSIS — K7200 Acute and subacute hepatic failure without coma: Secondary | ICD-10-CM | POA: Diagnosis not present

## 2020-05-21 DIAGNOSIS — D869 Sarcoidosis, unspecified: Secondary | ICD-10-CM | POA: Diagnosis not present

## 2020-05-21 DIAGNOSIS — R41841 Cognitive communication deficit: Secondary | ICD-10-CM | POA: Diagnosis not present

## 2020-05-21 DIAGNOSIS — E43 Unspecified severe protein-calorie malnutrition: Secondary | ICD-10-CM | POA: Diagnosis not present

## 2020-05-21 DIAGNOSIS — E538 Deficiency of other specified B group vitamins: Secondary | ICD-10-CM | POA: Diagnosis not present

## 2020-05-21 DIAGNOSIS — G8929 Other chronic pain: Secondary | ICD-10-CM | POA: Diagnosis not present

## 2020-05-22 ENCOUNTER — Encounter: Payer: Self-pay | Admitting: Family Medicine

## 2020-05-22 ENCOUNTER — Other Ambulatory Visit: Payer: Self-pay

## 2020-05-22 ENCOUNTER — Ambulatory Visit (INDEPENDENT_AMBULATORY_CARE_PROVIDER_SITE_OTHER): Payer: PPO | Admitting: Family Medicine

## 2020-05-22 VITALS — BP 117/80 | Temp 97.3°F | Resp 17 | Ht 71.0 in | Wt 217.8 lb

## 2020-05-22 DIAGNOSIS — E538 Deficiency of other specified B group vitamins: Secondary | ICD-10-CM | POA: Diagnosis not present

## 2020-05-22 DIAGNOSIS — R600 Localized edema: Secondary | ICD-10-CM

## 2020-05-22 DIAGNOSIS — K709 Alcoholic liver disease, unspecified: Secondary | ICD-10-CM | POA: Diagnosis not present

## 2020-05-22 DIAGNOSIS — I10 Essential (primary) hypertension: Secondary | ICD-10-CM

## 2020-05-22 DIAGNOSIS — Z8669 Personal history of other diseases of the nervous system and sense organs: Secondary | ICD-10-CM | POA: Diagnosis not present

## 2020-05-22 DIAGNOSIS — E069 Thyroiditis, unspecified: Secondary | ICD-10-CM | POA: Diagnosis not present

## 2020-05-22 DIAGNOSIS — I480 Paroxysmal atrial fibrillation: Secondary | ICD-10-CM

## 2020-05-22 DIAGNOSIS — K269 Duodenal ulcer, unspecified as acute or chronic, without hemorrhage or perforation: Secondary | ICD-10-CM | POA: Insufficient documentation

## 2020-05-22 DIAGNOSIS — D500 Iron deficiency anemia secondary to blood loss (chronic): Secondary | ICD-10-CM | POA: Diagnosis not present

## 2020-05-22 DIAGNOSIS — G9340 Encephalopathy, unspecified: Secondary | ICD-10-CM | POA: Insufficient documentation

## 2020-05-22 DIAGNOSIS — I4891 Unspecified atrial fibrillation: Secondary | ICD-10-CM | POA: Insufficient documentation

## 2020-05-22 LAB — CBC WITH DIFFERENTIAL/PLATELET
Basophils Absolute: 0 10*3/uL (ref 0.0–0.1)
Basophils Relative: 0.6 % (ref 0.0–3.0)
Eosinophils Absolute: 0.2 10*3/uL (ref 0.0–0.7)
Eosinophils Relative: 3.1 % (ref 0.0–5.0)
HCT: 27 % — ABNORMAL LOW (ref 39.0–52.0)
Hemoglobin: 9.4 g/dL — ABNORMAL LOW (ref 13.0–17.0)
Lymphocytes Relative: 16.4 % (ref 12.0–46.0)
Lymphs Abs: 0.9 10*3/uL (ref 0.7–4.0)
MCHC: 34.7 g/dL (ref 30.0–36.0)
MCV: 95.1 fl (ref 78.0–100.0)
Monocytes Absolute: 0.5 10*3/uL (ref 0.1–1.0)
Monocytes Relative: 8.3 % (ref 3.0–12.0)
Neutro Abs: 4.1 10*3/uL (ref 1.4–7.7)
Neutrophils Relative %: 71.6 % (ref 43.0–77.0)
Platelets: 73 10*3/uL — ABNORMAL LOW (ref 150.0–400.0)
RBC: 2.84 Mil/uL — ABNORMAL LOW (ref 4.22–5.81)
RDW: 21 % — ABNORMAL HIGH (ref 11.5–15.5)
WBC: 5.7 10*3/uL (ref 4.0–10.5)

## 2020-05-22 LAB — BASIC METABOLIC PANEL
BUN: 16 mg/dL (ref 6–23)
CO2: 26 mEq/L (ref 19–32)
Calcium: 8.5 mg/dL (ref 8.4–10.5)
Chloride: 102 mEq/L (ref 96–112)
Creatinine, Ser: 1.13 mg/dL (ref 0.40–1.50)
GFR: 65.51 mL/min (ref >60.00)
Glucose, Bld: 135 mg/dL — ABNORMAL HIGH (ref 70–99)
Potassium: 3.8 mEq/L (ref 3.5–5.1)
Sodium: 138 mEq/L (ref 135–145)

## 2020-05-22 LAB — TSH: TSH: 1.52 u[IU]/mL (ref 0.35–4.50)

## 2020-05-22 LAB — HEPATIC FUNCTION PANEL
ALT: 19 U/L (ref 0–53)
AST: 47 U/L — ABNORMAL HIGH (ref 0–37)
Albumin: 2.7 g/dL — ABNORMAL LOW (ref 3.5–5.2)
Alkaline Phosphatase: 110 U/L (ref 39–117)
Bilirubin, Direct: 2.1 mg/dL — ABNORMAL HIGH (ref 0.0–0.3)
Total Bilirubin: 5.3 mg/dL — ABNORMAL HIGH (ref 0.2–1.2)
Total Protein: 5 g/dL — ABNORMAL LOW (ref 6.0–8.3)

## 2020-05-22 LAB — T3, FREE: T3, Free: 2.7 pg/mL (ref 2.3–4.2)

## 2020-05-22 LAB — T4, FREE: Free T4: 1.05 ng/dL (ref 0.60–1.60)

## 2020-05-22 NOTE — Progress Notes (Signed)
Subjective:    Patient ID: Timothy Mckinney, male    DOB: March 28, 1951, 69 y.o.   MRN: [id number]  [facility] f/u- pt was admitted 10/20-27 after presenting to ER w/ confusion, worsening jaundice, and fall.  He was anemic at 6.9--> 6.1 after hydration and FOBT was +.  Ammonia was elevated at 108.  He was admitted for metabolic encephalopathy and GI bleed.  He was not taking his Lactulose at home.  Once this was started in the hospital, his levels normalized.  He is to continue his lactulose and Rifaximin.  Given the possibility of Wernicke's encephalopathy, he was started on Thiamine- which he is to continue.  He received 3 units PRBCs while hospitalized and Hgb 10.6 on d/c.  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  GI recommended Protonix BID x30 days and then switch to once daily.  Carafate was to be taken TID x1 month.  Is to f/u w/ GI.  Cr increased to 1.91 during hospitalization w/ no hx of renal disease.  He was volume overloaded so he was started on Spironolactone and Lasix.  Kidney function improved w/ diuresis and he was d/c'd w/ oral lasix and spironolactone.  New onset Afib was observed during this hospitalization.  He is not a candidate for anti-coagulation due to increased INR and high risk of bleeding. 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  Thyroid panel recommended in 6-8 weeks  He was d/c'd from hospital to rehab for 3 weeks and now he is home.  Today pt states, 'if it wasn't for some of the pain in my back i'd be fine'.  Was able to go upstairs last night for the first time.  Pt reports he has been unable to sleep since this started.  Appetite is returning.  No dark/tarry/bloody stools.  He is having issues w/ constipation since d/c despite taking Lactulose twice daily.  Wife reports cognitive abilities are 85-90%  Pt and wife have list of questions  today-  Taking Lasix once daily and Spironolactone 50mg  but feet/ankles remain swollen to the point he can't wear shoes.  No improvement w/ elevation.  Was using ACE compression at rehab- is no longer wrapping them.  Wife is concerned about Metoprolol dose due to hypotension.  Pt is not interested in returning to his current GI practice.   Review of Systems For ROS see HPI   This visit occurred during the SARS-CoV-2 public health emergency.  Safety protocols were in place, including screening questions prior to the visit, additional usage of staff PPE, and extensive cleaning of exam room while observing appropriate contact time as indicated for disinfecting solutions.       Objective:   Physical Exam Vitals reviewed.  Constitutional:      General: He is not in acute distress.    Appearance: Normal appearance. He is well-developed. He is not toxic-appearing.  HENT:     Head: Normocephalic and atraumatic.  Eyes:     Conjunctiva/sclera: Conjunctivae normal.     Pupils: Pupils are equal, round, and reactive to light.  Neck:     Thyroid: No thyromegaly.  Cardiovascular:     Rate and Rhythm: Normal rate and regular rhythm.     Heart sounds: Normal heart sounds. No murmur heard.   Pulmonary:     Effort: Pulmonary effort is normal. No respiratory distress.     Breath sounds: Normal breath sounds.  Abdominal:     General: Bowel sounds are normal. There is no distension.     Palpations: Abdomen is soft.  Musculoskeletal:     Cervical back: Normal range of motion and neck supple.     Right lower leg: Edema present.     Left lower leg: Edema present.  Lymphadenopathy:     Cervical: No cervical adenopathy.  Skin:    General: Skin is warm and dry.  Neurological:     Mental Status: He is alert and oriented to person, place, and time.     Cranial Nerves: No cranial nerve deficit.  Psychiatric:        Behavior: Behavior normal.           Assessment & Plan:

## 2020-05-22 NOTE — Patient Instructions (Signed)
We'll determine your follow up based on lab results We'll notify you of your lab results and make any changes if needed We'll call you with your Cardiology and GI referrals DECREASE your Metoprolol to 1/2 tab Call with any questions or concerns Stay Safe!  Stay Healthy! Happy Thanksgiving!!!

## 2020-05-23 ENCOUNTER — Other Ambulatory Visit: Payer: Self-pay

## 2020-05-23 DIAGNOSIS — D500 Iron deficiency anemia secondary to blood loss (chronic): Secondary | ICD-10-CM

## 2020-05-28 ENCOUNTER — Ambulatory Visit
Admission: RE | Admit: 2020-05-28 | Discharge: 2020-05-28 | Disposition: A | Discharge status: Home / Self Care | Payer: PPO | Source: Ambulatory Visit | Attending: Physical Medicine and Rehabilitation | Admitting: Physical Medicine and Rehabilitation

## 2020-05-28 ENCOUNTER — Other Ambulatory Visit: Payer: Self-pay

## 2020-05-28 DIAGNOSIS — M4802 Spinal stenosis, cervical region: Secondary | ICD-10-CM | POA: Diagnosis not present

## 2020-05-28 DIAGNOSIS — M545 Low back pain, unspecified: Secondary | ICD-10-CM | POA: Diagnosis not present

## 2020-05-28 DIAGNOSIS — M48061 Spinal stenosis, lumbar region without neurogenic claudication: Secondary | ICD-10-CM | POA: Diagnosis not present

## 2020-05-28 DIAGNOSIS — M542 Cervicalgia: Secondary | ICD-10-CM

## 2020-05-29 ENCOUNTER — Inpatient Hospital Stay: Payer: PPO | Admitting: Family Medicine

## 2020-05-29 ENCOUNTER — Telehealth: Payer: Self-pay | Admitting: Family Medicine

## 2020-05-29 DIAGNOSIS — K7030 Alcoholic cirrhosis of liver without ascites: Secondary | ICD-10-CM | POA: Diagnosis not present

## 2020-05-29 DIAGNOSIS — K766 Portal hypertension: Secondary | ICD-10-CM | POA: Diagnosis not present

## 2020-05-29 DIAGNOSIS — K7290 Hepatic failure, unspecified without coma: Secondary | ICD-10-CM | POA: Diagnosis not present

## 2020-05-29 DIAGNOSIS — R748 Abnormal levels of other serum enzymes: Secondary | ICD-10-CM | POA: Diagnosis not present

## 2020-05-29 DIAGNOSIS — R600 Localized edema: Secondary | ICD-10-CM | POA: Diagnosis not present

## 2020-05-29 DIAGNOSIS — F1010 Alcohol abuse, uncomplicated: Secondary | ICD-10-CM | POA: Diagnosis not present

## 2020-05-29 NOTE — Telephone Encounter (Signed)
Form placed in [place] bin for review

## 2020-05-29 NOTE — Assessment & Plan Note (Signed)
New.  Occurred during his hospitalization.  RRR today.  He is not a candidate for anticoagluation given his liver disease, elevated INR, and increased risk of bleeding.  Will have f/u w/ Cards.

## 2020-05-29 NOTE — Assessment & Plan Note (Signed)
Chronic problem.  Pt is actually having issues w/ hypotension.  Will decrease Metoprolol to 1/2 tab daily while he is on Spironolactone and lasix.  Pt expressed understanding and is in agreement w/ plan.

## 2020-05-29 NOTE — Assessment & Plan Note (Signed)
New.  Pt is to f/u w/ GI (he would like to switch providers) and is currently on Protonix BID and Carafate TID.  Told him to continue this regimen until GI tells him otherwise.  Pt expressed understanding and is in agreement w/ plan.

## 2020-05-29 NOTE — Telephone Encounter (Signed)
Home health form placed in Dr. Virgil Benedict bin up front

## 2020-05-29 NOTE — Assessment & Plan Note (Signed)
Ongoing issue for pt.  He is to have an appt w/ specialist upcoming.  Stressed the importance of this visit.  Pt has been alcohol free x3 months at this time.

## 2020-05-29 NOTE — Assessment & Plan Note (Signed)
Ongoing issue.  Explained that this is common in people w/ hx of alcohol abuse.  Encouraged them to continue their thiamine and folate supplementation.

## 2020-05-29 NOTE — Assessment & Plan Note (Signed)
Resolved.  Pt was not taking his lactulose at home and presented w/ ammonia level of 108.  Wife reports cognitive abilities are back to 85-90% of pre-hospitalized state.  He is aware that he needs to continue his lactulose at least until he sees the liver specialist.

## 2020-05-29 NOTE — Assessment & Plan Note (Signed)
Hgb dropped as low as 6.1 during admission.  He received 3 units PRBCs while hospitalized and his Hgb was 10.6 on d/c.  This was thought to be due to gastric/duodenal ulcers.  He denies dark or tarry stools since d/c.  Will check CBC today to ensure Hgb is stable.

## 2020-05-29 NOTE — Assessment & Plan Note (Signed)
Ongoing issue for pt.  Since decreasing his Lasix to once daily, he reports increased LE swelling.  Encouraged elevation and compression.  Will follow.

## 2020-05-29 NOTE — Assessment & Plan Note (Signed)
New.  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  Will repeat labs today and address any abnormalities if present.

## 2020-05-30 DIAGNOSIS — R748 Abnormal levels of other serum enzymes: Secondary | ICD-10-CM | POA: Diagnosis not present

## 2020-05-30 DIAGNOSIS — F1010 Alcohol abuse, uncomplicated: Secondary | ICD-10-CM | POA: Diagnosis not present

## 2020-05-30 DIAGNOSIS — M47816 Spondylosis without myelopathy or radiculopathy, lumbar region: Secondary | ICD-10-CM | POA: Diagnosis not present

## 2020-05-30 DIAGNOSIS — M545 Low back pain, unspecified: Secondary | ICD-10-CM | POA: Diagnosis not present

## 2020-05-30 DIAGNOSIS — K7030 Alcoholic cirrhosis of liver without ascites: Secondary | ICD-10-CM | POA: Diagnosis not present

## 2020-05-31 DIAGNOSIS — Z791 Long term (current) use of non-steroidal anti-inflammatories (NSAID): Secondary | ICD-10-CM | POA: Diagnosis not present

## 2020-05-31 DIAGNOSIS — E785 Hyperlipidemia, unspecified: Secondary | ICD-10-CM | POA: Diagnosis not present

## 2020-05-31 DIAGNOSIS — M1990 Unspecified osteoarthritis, unspecified site: Secondary | ICD-10-CM | POA: Diagnosis not present

## 2020-05-31 DIAGNOSIS — G8929 Other chronic pain: Secondary | ICD-10-CM | POA: Diagnosis not present

## 2020-05-31 DIAGNOSIS — I10 Essential (primary) hypertension: Secondary | ICD-10-CM | POA: Diagnosis not present

## 2020-05-31 DIAGNOSIS — D696 Thrombocytopenia, unspecified: Secondary | ICD-10-CM | POA: Diagnosis not present

## 2020-05-31 DIAGNOSIS — D61818 Other pancytopenia: Secondary | ICD-10-CM | POA: Diagnosis not present

## 2020-05-31 DIAGNOSIS — Z9181 History of falling: Secondary | ICD-10-CM | POA: Diagnosis not present

## 2020-05-31 DIAGNOSIS — K7200 Acute and subacute hepatic failure without coma: Secondary | ICD-10-CM | POA: Diagnosis not present

## 2020-05-31 DIAGNOSIS — K7030 Alcoholic cirrhosis of liver without ascites: Secondary | ICD-10-CM | POA: Diagnosis not present

## 2020-05-31 DIAGNOSIS — K219 Gastro-esophageal reflux disease without esophagitis: Secondary | ICD-10-CM | POA: Diagnosis not present

## 2020-05-31 DIAGNOSIS — R41841 Cognitive communication deficit: Secondary | ICD-10-CM | POA: Diagnosis not present

## 2020-05-31 DIAGNOSIS — D869 Sarcoidosis, unspecified: Secondary | ICD-10-CM | POA: Diagnosis not present

## 2020-05-31 DIAGNOSIS — F411 Generalized anxiety disorder: Secondary | ICD-10-CM | POA: Diagnosis not present

## 2020-05-31 DIAGNOSIS — H04123 Dry eye syndrome of bilateral lacrimal glands: Secondary | ICD-10-CM | POA: Diagnosis not present

## 2020-05-31 DIAGNOSIS — I480 Paroxysmal atrial fibrillation: Secondary | ICD-10-CM | POA: Diagnosis not present

## 2020-05-31 DIAGNOSIS — Z7901 Long term (current) use of anticoagulants: Secondary | ICD-10-CM | POA: Diagnosis not present

## 2020-05-31 DIAGNOSIS — E538 Deficiency of other specified B group vitamins: Secondary | ICD-10-CM | POA: Diagnosis not present

## 2020-05-31 DIAGNOSIS — E43 Unspecified severe protein-calorie malnutrition: Secondary | ICD-10-CM | POA: Diagnosis not present

## 2020-05-31 DIAGNOSIS — D649 Anemia, unspecified: Secondary | ICD-10-CM | POA: Diagnosis not present

## 2020-06-05 ENCOUNTER — Emergency Department (HOSPITAL_COMMUNITY): Payer: PPO

## 2020-06-05 ENCOUNTER — Observation Stay (HOSPITAL_COMMUNITY): Payer: PPO

## 2020-06-05 ENCOUNTER — Observation Stay (HOSPITAL_COMMUNITY)
Admit: 2020-06-05 | Discharge: 2020-06-05 | Disposition: A | Discharge status: Home / Self Care | Payer: PPO | Attending: Internal Medicine | Admitting: Internal Medicine

## 2020-06-05 ENCOUNTER — Inpatient Hospital Stay (HOSPITAL_COMMUNITY)
Admission: EM | Admit: 2020-06-05 | Discharge: 2020-06-10 | DRG: 441 | Disposition: A | Discharge status: Home Health | Payer: PPO | Attending: Internal Medicine | Admitting: Internal Medicine

## 2020-06-05 ENCOUNTER — Inpatient Hospital Stay (HOSPITAL_COMMUNITY): Payer: PPO

## 2020-06-05 ENCOUNTER — Encounter (HOSPITAL_COMMUNITY): Payer: Self-pay

## 2020-06-05 ENCOUNTER — Other Ambulatory Visit: Payer: Self-pay

## 2020-06-05 DIAGNOSIS — Z8371 Family history of colonic polyps: Secondary | ICD-10-CM | POA: Diagnosis not present

## 2020-06-05 DIAGNOSIS — I480 Paroxysmal atrial fibrillation: Secondary | ICD-10-CM | POA: Diagnosis not present

## 2020-06-05 DIAGNOSIS — E43 Unspecified severe protein-calorie malnutrition: Secondary | ICD-10-CM | POA: Diagnosis not present

## 2020-06-05 DIAGNOSIS — G4730 Sleep apnea, unspecified: Secondary | ICD-10-CM | POA: Diagnosis present

## 2020-06-05 DIAGNOSIS — I10 Essential (primary) hypertension: Secondary | ICD-10-CM | POA: Diagnosis not present

## 2020-06-05 DIAGNOSIS — K269 Duodenal ulcer, unspecified as acute or chronic, without hemorrhage or perforation: Secondary | ICD-10-CM | POA: Diagnosis present

## 2020-06-05 DIAGNOSIS — R6520 Severe sepsis without septic shock: Secondary | ICD-10-CM

## 2020-06-05 DIAGNOSIS — D62 Acute posthemorrhagic anemia: Secondary | ICD-10-CM | POA: Diagnosis not present

## 2020-06-05 DIAGNOSIS — K7290 Hepatic failure, unspecified without coma: Secondary | ICD-10-CM | POA: Diagnosis not present

## 2020-06-05 DIAGNOSIS — Z880 Allergy status to penicillin: Secondary | ICD-10-CM

## 2020-06-05 DIAGNOSIS — A419 Sepsis, unspecified organism: Secondary | ICD-10-CM | POA: Diagnosis present

## 2020-06-05 DIAGNOSIS — K7030 Alcoholic cirrhosis of liver without ascites: Secondary | ICD-10-CM | POA: Diagnosis present

## 2020-06-05 DIAGNOSIS — Y900 Blood alcohol level of less than 20 mg/100 ml: Secondary | ICD-10-CM | POA: Diagnosis present

## 2020-06-05 DIAGNOSIS — K31819 Angiodysplasia of stomach and duodenum without bleeding: Secondary | ICD-10-CM | POA: Diagnosis not present

## 2020-06-05 DIAGNOSIS — G9340 Encephalopathy, unspecified: Secondary | ICD-10-CM

## 2020-06-05 DIAGNOSIS — D684 Acquired coagulation factor deficiency: Secondary | ICD-10-CM | POA: Diagnosis present

## 2020-06-05 DIAGNOSIS — R4182 Altered mental status, unspecified: Secondary | ICD-10-CM | POA: Diagnosis not present

## 2020-06-05 DIAGNOSIS — J90 Pleural effusion, not elsewhere classified: Secondary | ICD-10-CM | POA: Diagnosis not present

## 2020-06-05 DIAGNOSIS — D649 Anemia, unspecified: Secondary | ICD-10-CM

## 2020-06-05 DIAGNOSIS — R29810 Facial weakness: Secondary | ICD-10-CM | POA: Diagnosis not present

## 2020-06-05 DIAGNOSIS — D869 Sarcoidosis, unspecified: Secondary | ICD-10-CM | POA: Diagnosis present

## 2020-06-05 DIAGNOSIS — Z20822 Contact with and (suspected) exposure to covid-19: Secondary | ICD-10-CM | POA: Diagnosis present

## 2020-06-05 DIAGNOSIS — I959 Hypotension, unspecified: Secondary | ICD-10-CM | POA: Diagnosis not present

## 2020-06-05 DIAGNOSIS — E669 Obesity, unspecified: Secondary | ICD-10-CM | POA: Diagnosis present

## 2020-06-05 DIAGNOSIS — Z8249 Family history of ischemic heart disease and other diseases of the circulatory system: Secondary | ICD-10-CM

## 2020-06-05 DIAGNOSIS — R404 Transient alteration of awareness: Secondary | ICD-10-CM | POA: Diagnosis not present

## 2020-06-05 DIAGNOSIS — R531 Weakness: Secondary | ICD-10-CM | POA: Diagnosis not present

## 2020-06-05 DIAGNOSIS — K766 Portal hypertension: Secondary | ICD-10-CM | POA: Diagnosis present

## 2020-06-05 DIAGNOSIS — F1021 Alcohol dependence, in remission: Secondary | ICD-10-CM | POA: Diagnosis not present

## 2020-06-05 DIAGNOSIS — N200 Calculus of kidney: Secondary | ICD-10-CM | POA: Diagnosis not present

## 2020-06-05 DIAGNOSIS — R911 Solitary pulmonary nodule: Secondary | ICD-10-CM | POA: Diagnosis not present

## 2020-06-05 DIAGNOSIS — K7682 Hepatic encephalopathy: Secondary | ICD-10-CM

## 2020-06-05 DIAGNOSIS — D500 Iron deficiency anemia secondary to blood loss (chronic): Secondary | ICD-10-CM | POA: Diagnosis not present

## 2020-06-05 DIAGNOSIS — K219 Gastro-esophageal reflux disease without esophagitis: Secondary | ICD-10-CM | POA: Diagnosis not present

## 2020-06-05 DIAGNOSIS — Z96659 Presence of unspecified artificial knee joint: Secondary | ICD-10-CM | POA: Diagnosis not present

## 2020-06-05 DIAGNOSIS — Z6831 Body mass index (BMI) 31.0-31.9, adult: Secondary | ICD-10-CM

## 2020-06-05 DIAGNOSIS — K3189 Other diseases of stomach and duodenum: Secondary | ICD-10-CM | POA: Diagnosis not present

## 2020-06-05 LAB — AMMONIA: Ammonia: 156 umol/L — ABNORMAL HIGH (ref 9–35)

## 2020-06-05 LAB — CBC WITH DIFFERENTIAL/PLATELET
Abs Immature Granulocytes: 0.01 10*3/uL (ref 0.00–0.07)
Abs Immature Granulocytes: 0.03 10*3/uL (ref 0.00–0.07)
Basophils Absolute: 0 10*3/uL (ref 0.0–0.1)
Basophils Absolute: 0 10*3/uL (ref 0.0–0.1)
Basophils Relative: 1 %
Basophils Relative: 1 %
Eosinophils Absolute: 0 10*3/uL (ref 0.0–0.5)
Eosinophils Absolute: 0 10*3/uL (ref 0.0–0.5)
Eosinophils Relative: 1 %
Eosinophils Relative: 0 %
HCT: 27.5 % — ABNORMAL LOW (ref 39.0–52.0)
HCT: 25 % — ABNORMAL LOW (ref 39.0–52.0)
Hemoglobin: 8.9 g/dL — ABNORMAL LOW (ref 13.0–17.0)
Hemoglobin: 8.2 g/dL — ABNORMAL LOW (ref 13.0–17.0)
Immature Granulocytes: 0 %
Immature Granulocytes: 1 %
Lymphocytes Relative: 13 %
Lymphocytes Relative: 13 %
Lymphs Abs: 0.5 10*3/uL — ABNORMAL LOW (ref 0.7–4.0)
Lymphs Abs: 0.5 10*3/uL — ABNORMAL LOW (ref 0.7–4.0)
MCH: 33.5 pg (ref 26.0–34.0)
MCH: 32.9 pg (ref 26.0–34.0)
MCHC: 32.4 g/dL (ref 30.0–36.0)
MCHC: 32.8 g/dL (ref 30.0–36.0)
MCV: 103.4 fL — ABNORMAL HIGH (ref 80.0–100.0)
MCV: 100.4 fL — ABNORMAL HIGH (ref 80.0–100.0)
Monocytes Absolute: 0.2 10*3/uL (ref 0.1–1.0)
Monocytes Absolute: 0.3 10*3/uL (ref 0.1–1.0)
Monocytes Relative: 6 %
Monocytes Relative: 7 %
Neutro Abs: 2.9 10*3/uL (ref 1.7–7.7)
Neutro Abs: 3.4 10*3/uL (ref 1.7–7.7)
Neutrophils Relative %: 79 %
Neutrophils Relative %: 78 %
Platelets: 79 10*3/uL — ABNORMAL LOW (ref 150–400)
Platelets: 69 10*3/uL — ABNORMAL LOW (ref 150–400)
RBC: 2.66 MIL/uL — ABNORMAL LOW (ref 4.22–5.81)
RBC: 2.49 MIL/uL — ABNORMAL LOW (ref 4.22–5.81)
RDW: 18.5 % — ABNORMAL HIGH (ref 11.5–15.5)
RDW: 18.5 % — ABNORMAL HIGH (ref 11.5–15.5)
WBC: 3.7 10*3/uL — ABNORMAL LOW (ref 4.0–10.5)
WBC: 4.2 10*3/uL (ref 4.0–10.5)
nRBC: 0 % (ref 0.0–0.2)
nRBC: 0 % (ref 0.0–0.2)

## 2020-06-05 LAB — COMPREHENSIVE METABOLIC PANEL
ALT: 26 U/L (ref 0–44)
AST: 59 U/L — ABNORMAL HIGH (ref 15–41)
Albumin: 2.9 g/dL — ABNORMAL LOW (ref 3.5–5.0)
Alkaline Phosphatase: 104 U/L (ref 38–126)
Anion gap: 12 (ref 5–15)
BUN: 34 mg/dL — ABNORMAL HIGH (ref 8–23)
CO2: 22 mmol/L (ref 22–32)
Calcium: 9.3 mg/dL (ref 8.9–10.3)
Chloride: 104 mmol/L (ref 98–111)
Creatinine, Ser: 1.09 mg/dL (ref 0.61–1.24)
GFR, Estimated: >60 mL/min (ref >60)
Glucose, Bld: 129 mg/dL — ABNORMAL HIGH (ref 70–99)
Potassium: 4.8 mmol/L (ref 3.5–5.1)
Sodium: 138 mmol/L (ref 135–145)
Total Bilirubin: 3.8 mg/dL — ABNORMAL HIGH (ref 0.3–1.2)
Total Protein: 5.8 g/dL — ABNORMAL LOW (ref 6.5–8.1)

## 2020-06-05 LAB — URINALYSIS, COMPLETE (UACMP) WITH MICROSCOPIC
Bacteria, UA: NONE SEEN
Bilirubin Urine: NEGATIVE
Glucose, UA: NEGATIVE mg/dL
Hgb urine dipstick: NEGATIVE
Ketones, ur: 5 mg/dL — AB
Leukocytes,Ua: NEGATIVE
Nitrite: NEGATIVE
Protein, ur: NEGATIVE mg/dL
Specific Gravity, Urine: 1.018 (ref 1.005–1.030)
pH: 6 (ref 5.0–8.0)

## 2020-06-05 LAB — APTT: aPTT: 40 seconds — ABNORMAL HIGH (ref 24–36)

## 2020-06-05 LAB — PROTIME-INR
INR: 1.4 — ABNORMAL HIGH (ref 0.8–1.2)
Prothrombin Time: 16.6 seconds — ABNORMAL HIGH (ref 11.4–15.2)

## 2020-06-05 LAB — I-STAT CHEM 8, ED
BUN: 28 mg/dL — ABNORMAL HIGH (ref 8–23)
Calcium, Ion: 1.21 mmol/L (ref 1.15–1.40)
Chloride: 103 mmol/L (ref 98–111)
Creatinine, Ser: 1 mg/dL (ref 0.61–1.24)
Glucose, Bld: 124 mg/dL — ABNORMAL HIGH (ref 70–99)
HCT: 23 % — ABNORMAL LOW (ref 39.0–52.0)
Hemoglobin: 7.8 g/dL — ABNORMAL LOW (ref 13.0–17.0)
Potassium: 4.6 mmol/L (ref 3.5–5.1)
Sodium: 137 mmol/L (ref 135–145)
TCO2: 22 mmol/L (ref 22–32)

## 2020-06-05 LAB — LACTIC ACID, PLASMA
Lactic Acid, Venous: 4.8 mmol/L (ref 0.5–1.9)
Lactic Acid, Venous: 2.9 mmol/L (ref 0.5–1.9)
Lactic Acid, Venous: 2.7 mmol/L (ref 0.5–1.9)
Lactic Acid, Venous: 2.9 mmol/L (ref 0.5–1.9)

## 2020-06-05 LAB — RESP PANEL BY RT-PCR (FLU A&B, COVID) ARPGX2
Influenza A by PCR: NEGATIVE
Influenza B by PCR: NEGATIVE
SARS Coronavirus 2 by RT PCR: NEGATIVE

## 2020-06-05 LAB — CBG MONITORING, ED: Glucose-Capillary: 107 mg/dL — ABNORMAL HIGH (ref 70–99)

## 2020-06-05 LAB — ETHANOL: Alcohol, Ethyl (B): <10 mg/dL (ref <10)

## 2020-06-05 LAB — POC OCCULT BLOOD, ED: Fecal Occult Bld: POSITIVE — AB

## 2020-06-05 MED ORDER — RIFAXIMIN 550 MG PO TABS
550.0000 mg | ORAL_TABLET | Freq: Two times a day (BID) | ORAL | Status: DC
  Administered 2020-06-05 – 2020-06-10 (×10): 550 mg via ORAL
  Filled 2020-06-05 (×11): qty 1

## 2020-06-05 MED ORDER — LACTULOSE ENEMA
300.0000 mL | Freq: Once | ORAL | Status: AC
  Administered 2020-06-05: 300 mL via RECTAL
  Filled 2020-06-05: qty 300

## 2020-06-05 MED ORDER — LACTATED RINGERS IV BOLUS (SEPSIS)
1000.0000 mL | Freq: Once | INTRAVENOUS | Status: AC
  Administered 2020-06-05: 1000 mL via INTRAVENOUS

## 2020-06-05 MED ORDER — VANCOMYCIN HCL 1250 MG/250ML IV SOLN
1250.0000 mg | Freq: Two times a day (BID) | INTRAVENOUS | Status: DC
  Administered 2020-06-06: 1250 mg via INTRAVENOUS
  Filled 2020-06-05: qty 250

## 2020-06-05 MED ORDER — SODIUM CHLORIDE 0.9 % IV SOLN
2.0000 g | Freq: Once | INTRAVENOUS | Status: AC
  Administered 2020-06-05: 2 g via INTRAVENOUS
  Filled 2020-06-05: qty 2

## 2020-06-05 MED ORDER — VANCOMYCIN HCL IN DEXTROSE 1-5 GM/200ML-% IV SOLN
1000.0000 mg | Freq: Once | INTRAVENOUS | Status: AC
  Administered 2020-06-05: 1000 mg via INTRAVENOUS
  Filled 2020-06-05: qty 200

## 2020-06-05 MED ORDER — SODIUM CHLORIDE 0.9 % IV SOLN
2.0000 g | Freq: Once | INTRAVENOUS | Status: DC
  Filled 2020-06-05: qty 2

## 2020-06-05 MED ORDER — SODIUM CHLORIDE 0.9 % IV SOLN
2.0000 g | Freq: Three times a day (TID) | INTRAVENOUS | Status: DC
  Administered 2020-06-05 – 2020-06-06 (×3): 2 g via INTRAVENOUS
  Filled 2020-06-05 (×3): qty 2

## 2020-06-05 MED ORDER — LACTATED RINGERS IV SOLN: INTRAVENOUS | Status: DC

## 2020-06-05 NOTE — Progress Notes (Signed)
EEG Completed; Results Pending  

## 2020-06-05 NOTE — Progress Notes (Signed)
Notified provider and bedside nurse of need to order and draw repeat lactic acid.  Message sent to provide and RN requesting an order for repeat lactic. Provider responded that they were aware.

## 2020-06-05 NOTE — ED Notes (Signed)
Pt has pulled his NG tube out at this time. States he is "OK" and doesn't want it. Message sent to provider to advise.

## 2020-06-05 NOTE — Procedures (Signed)
Patient Name: Timothy Mckinney  MRN: [id number]  Epilepsy Attending: Lora Havens  Referring Physician/Provider: Dr Cherylann Ratel Date: 06/05/2020 Duration: 23.01 mins  Patient history: 69yo M with ams. EEG to evaluate for seizure  Level of alertness: Awake  AEDs during EEG study: None  Technical aspects: This EEG study was done with scalp electrodes positioned according to the 10-20 International system of electrode placement. Electrical activity was acquired at a sampling rate of 500Hz  and reviewed with a high frequency filter of 70Hz  and a low frequency filter of 1Hz . EEG data were recorded continuously and digitally stored.   Description: No posterior dominant rhythm was seen. EEG showed continuous generalized 3 to 6 Hz theta-delta slowing. Generalized periodic discharges with triphasic morphology at 1Hz  were also noted.  Hyperventilation and photic stimulation were not performed.     ABNORMALITY -Continuous slow, generalized -Periodic discharges with triphasic morphology, generalized  IMPRESSION: This study is suggestive of moderate diffuse encephalopathy, nonspecific etiology but could be related to toxic-metabolic causes like hyperammonemia. No seizures or definite epileptiform discharges were seen throughout the recording.  Timothy Mckinney Timothy Mckinney

## 2020-06-05 NOTE — Progress Notes (Signed)
Elink following for code sepsis 

## 2020-06-05 NOTE — ED Triage Notes (Signed)
Per [facility], Pt, from home, c/o increased weakness and AMS.  Denies pain.  Pt lives w/ wife.  Hx of liver disease and HTN.  Pt is typically ambulatory and A & O.

## 2020-06-05 NOTE — H&P (Signed)
History and Physical    MARYLAND LUPPINO FXT:[id number] DOB: 1951-01-18 DOA: 06/05/2020  PCP: [name], MD  Patient coming from: Home  Chief Complaint: AMS  HPI: PAULA ZIETZ is a 69 y.o. male with medical history significant of cirrhosis. Presenting with AMS. Wife reports that he's presented to the ED recently with similar symptoms. He was sent home on lactulose, rifaximin. He was unable to afford the rifaximin, but took the lactulose daily. He was having significant diarrhea on Friday. He's wife spoke with a liver specialist who told her he could hold the lactulose for one day only. They did and resumed it on Saturday. He continued to have BMs, but not as loose. Apparently when she tried to wake him this morning, he would just stare into space. He would then repeat everything she said. She recognized this as similar to the confusion he had previously shoe she called for EMS. Of note, she reports during this time he was incontinent of bladder, but no tonic-clonic activity noted.     ED Course: He was found to have an ammonia of 158 and a lactic acid of 4.8. He was started on broad spec abx. [facility] was called for admission.   Review of Systems:  Unable to obtain d/t mentation.   PMHx Past Medical History:  Diagnosis Date  . Abnormal chest x-ray    ? granulomata  . Arthritis    knees and back   . DDD (degenerative disc disease), lumbar    MRI 08/2005  . Dry eyes, bilateral    Dr Bing Plume  . Elevated liver enzymes    HISTORY OF ELEVATED LIVER ENZYMES  . GERD (gastroesophageal reflux disease)    hx of   . Hypertension    NEW DIAGNOSIS AND STARTED ON METOPROLOL ON 10/04/11  . Hyperuricemia   . 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 Nephrolithiasis    Dr Serita Butcher  . Sleep apnea    STOP BANG SCORE OF 4  . Thrombocytopenia ([place])    HX OF LOW PLATELET COUNT    PSHx Past Surgical History:  Procedure Laterality Date  . ANAL FISTULECTOMY  06/01/2012   Procedure:  FISTULECTOMY ANAL;  Surgeon: Leighton Ruff, MD;  Location: [facility];  Service: General;  Laterality: N/A;  Mucosal Advancement Flap   . BIOPSY  04/21/2020   Procedure: BIOPSY;  Surgeon: Carol Ada, MD;  Location: [facility];  Service: Endoscopy;;  . CARDIAC CATHETERIZATION     in 40s, negative  . colon ulcer  2011  . COLONOSCOPY W/ POLYPECTOMY  2011   Dr Sharlett Iles  . ESOPHAGOGASTRODUODENOSCOPY (EGD) WITH PROPOFOL N/A 04/21/2020   Procedure: ESOPHAGOGASTRODUODENOSCOPY (EGD) WITH PROPOFOL;  Surgeon: Carol Ada, MD;  Location: [facility];  Service: Endoscopy;  Laterality: N/A;  . KNEE ARTHROSCOPY  2009    L  . KNEE SURGERY     april 2013 total knee replacement removed  . LITHOTRIPSY    . tear duct plugs    . TOTAL KNEE ARTHROPLASTY  2011   Workman's Comp case, Dr Veverly Fells    SocHx  reports that he has never smoked. He has never used smokeless tobacco. He reports current alcohol use. He reports that he does not use drugs.  Allergies  Allergen Reactions  . Penicillins Swelling    Diffuse swelling    FamHx Family History  Problem Relation Age of Onset  . Heart attack Father 44  . Colon polyps Mother   . Dementia Mother  died post SDH  . Cancer Paternal Grandfather        throat , pipe smoker  . Colon polyps Sister   . Cancer Sister        Colon, stage 2  . Hearing loss Brother   . Stroke Neg Hx     Prior to Admission medications   Medication Sig Start Date End Date Taking? Authorizing Provider  cycloSPORINE (RESTASIS) 0.05 % ophthalmic emulsion Place 1 drop into both eyes daily.    [provider]  fluticasone (FLONASE) 50 MCG/ACT nasal spray Place 1 spray into both nostrils daily as needed for allergies or rhinitis.    [provider]  folic acid (FOLVITE) 1 MG tablet Take 1 tablet (1 mg total) by mouth daily. 04/26/20   Shelly Coss, MD  furosemide (LASIX) 40 MG tablet Take 1 tablet (40 mg total) by mouth daily. 04/26/20   Shelly Coss, MD  ibuprofen (ADVIL) 800 MG tablet Take 800 mg by mouth every 8 (eight) hours as needed for moderate pain. Patient not taking: Reported on 05/22/2020    [provider]  lactulose (CHRONULAC) 10 GM/15ML solution Take 15 mLs (10 g total) by mouth 2 (two) times daily. 04/26/20   Shelly Coss, MD  levocetirizine (XYZAL) 5 MG tablet Take 5 mg by mouth every evening.    [provider]  metoprolol succinate (TOPROL-XL) 25 MG 24 hr tablet TAKE 1 TABLET(25 MG) BY MOUTH DAILY Patient taking differently: Take 25 mg by mouth daily.  03/27/20   [name], MD  ondansetron (ZOFRAN) 4 MG tablet Take 4 mg by mouth every 8 (eight) hours as needed for nausea or vomiting. Patient not taking: Reported on 05/22/2020    [provider]  oxyCODONE (OXY IR/ROXICODONE) 5 MG immediate release tablet Take 1 tablet (5 mg total) by mouth every 6 (six) hours as needed for moderate pain. Patient not taking: Reported on 05/22/2020 04/26/20   Shelly Coss, MD  pantoprazole (PROTONIX) 40 MG tablet Take 1 tablet (40 mg total) by mouth 2 (two) times daily. 04/26/20   Shelly Coss, MD  potassium chloride SA (KLOR-CON) 20 MEQ tablet Take 1 tablet (20 mEq total) by mouth daily. 04/26/20   Shelly Coss, MD  rifaximin (XIFAXAN) 550 MG TABS tablet Take 1 tablet (550 mg total) by mouth 2 (two) times daily. 04/26/20   Shelly Coss, MD  spironolactone (ALDACTONE) 50 MG tablet Take 1 tablet (50 mg total) by mouth daily. 04/26/20   Shelly Coss, MD  sucralfate (CARAFATE) 1 g tablet Take 1 tablet (1 g total) by mouth 4 (four) times daily -  with meals and at bedtime. 04/26/20   Shelly Coss, MD  thiamine 100 MG tablet Take 1 tablet (100 mg total) by mouth daily. 04/26/20   Shelly Coss, MD    Physical Exam: Vitals:   06/05/20 1230 06/05/20 1245 06/05/20 1310 06/05/20 1330  BP: (!) 131/54  (!) 132/56 (!) 123/58  Pulse: (!) 111 73 88 74  Resp: 13 13 (!) 22 16  Temp:       TempSrc:      SpO2: 100% 99% 100% 97%  Weight:      Height:        General: 69 y.o. male resting in bed in NAD Eyes: PERRL, normal sclera ENMT: Nares patent w/o discharge, orophaynx clear, dentition normal, ears w/o discharge/lesions/ulcers Neck: Supple, trachea midline Cardiovascular: RRR, +S1, S2, no m/g/r, equal pulses throughout Respiratory: CTABL, no w/r/r, normal WOB GI:  BS+, NDNT, no masses noted, no organomegaly noted MSK: No e/c/c Skin: No rashes, bruises, ulcerations noted Neuro: He is not oriented to person, place, or time, he keeps repeating "I'm doing well", no focal deficits  Labs on Admission: I have personally reviewed following labs and imaging studies  CBC: Recent Labs  Lab 06/05/20 1152 06/05/20 1158  WBC 3.7*  --   NEUTROABS 2.9  --   HGB 8.9* 7.8*  HCT 27.5* 23.0*  MCV 103.4*  --   PLT 79*  --    Basic Metabolic Panel: Recent Labs  Lab 06/05/20 1152 06/05/20 1158  NA 138 137  K 4.8 4.6  CL 104 103  CO2 22  --   GLUCOSE 129* 124*  BUN 34* 28*  CREATININE 1.09 1.00  CALCIUM 9.3  --    GFR: Estimated Creatinine Clearance: 81 mL/min (by C-G formula based on SCr of 1 mg/dL). Liver Function Tests: Recent Labs  Lab 06/05/20 1152  AST 59*  ALT 26  ALKPHOS 104  BILITOT 3.8*  PROT 5.8*  ALBUMIN 2.9*   No results for input(s): LIPASE, AMYLASE in the last 168 hours. Recent Labs  Lab 06/05/20 1152  AMMONIA 156*   Coagulation Profile: No results for input(s): INR, PROTIME in the last 168 hours. Cardiac Enzymes: No results for input(s): CKTOTAL, CKMB, CKMBINDEX, TROPONINI in the last 168 hours. BNP (last 3 results) No results for input(s): PROBNP in the last 8760 hours. HbA1C: No results for input(s): HGBA1C in the last 72 hours. CBG: Recent Labs  Lab 06/05/20 1136  GLUCAP 107*   Lipid Profile: No results for input(s): CHOL, HDL, LDLCALC, TRIG, CHOLHDL, LDLDIRECT in the last 72 hours. Thyroid Function Tests: No results for  input(s): TSH, T4TOTAL, FREET4, T3FREE, THYROIDAB in the last 72 hours. Anemia Panel: No results for input(s): VITAMINB12, FOLATE, FERRITIN, TIBC, IRON, RETICCTPCT in the last 72 hours. Urine analysis:    Component Value Date/Time   COLORURINE YELLOW 06/05/2020 1315   APPEARANCEUR CLEAR 06/05/2020 1315   LABSPEC 1.018 06/05/2020 1315   PHURINE 6.0 06/05/2020 1315   GLUCOSEU NEGATIVE 06/05/2020 1315   HGBUR NEGATIVE 06/05/2020 1315   BILIRUBINUR NEGATIVE 06/05/2020 1315   KETONESUR 5 (A) 06/05/2020 1315   PROTEINUR NEGATIVE 06/05/2020 1315   UROBILINOGEN 0.2 10/15/2014 1020   NITRITE NEGATIVE 06/05/2020 1315   LEUKOCYTESUR NEGATIVE 06/05/2020 1315    Radiological Exams on Admission: CT HEAD WO CONTRAST  Result Date: 06/05/2020 CLINICAL DATA:  Weakness, altered mental status. EXAM: CT HEAD WITHOUT CONTRAST TECHNIQUE: Contiguous axial images were obtained from the base of the skull through the vertex without intravenous contrast. COMPARISON:  04/19/2020 FINDINGS: Brain: No evidence of acute infarction, hemorrhage, hydrocephalus, extra-axial collection or mass lesion/mass effect. Scattered low-density changes within the periventricular and subcortical white matter compatible with chronic microvascular ischemic change. Mild diffuse cerebral volume loss. Vascular: Atherosclerotic calcifications involving the large vessels of the skull base. No unexpected hyperdense vessel. Skull: Normal. Negative for fracture or focal lesion. Sinuses/Orbits: No acute finding. Other: None. IMPRESSION: 1. No acute intracranial findings. 2. Chronic microvascular ischemic change and cerebral volume loss. Electronically Signed   By: Davina Poke D.O.   On: 06/05/2020 13:18   DG Chest Port 1 View  Result Date: 06/05/2020 CLINICAL DATA:  Altered mental status. EXAM: PORTABLE CHEST 1 VIEW COMPARISON:  10/15/2014 FINDINGS: The cardiomediastinal silhouette is unchanged. There is persistent elevation of the right  hemidiaphragm. The left lung base was incompletely imaged, however there is new hazy  airspace opacity in this region with an underlying pleural effusion also possible. The right lung is grossly clear. No pneumothorax is identified. No acute osseous abnormality is seen. IMPRESSION: Left basilar opacity which could reflect atelectasis or pneumonia, potentially with a small pleural effusion. Consider further evaluation with PA and lateral chest radiographs when clinically able. Electronically Signed   By: Logan Bores M.D.   On: 06/05/2020 12:35    EKG: Independently reviewed. A fib  Assessment/Plan Sepsis, source unknown     - admit to inpatient, telemetry     - criteria: tachycardia, low WBC, lactic acid 4.8, AMS, unknown source - presumed intrabdominal      - source unknown, presumed to be intraabdominal     - CT ab/pelvis ordered, Bld Cx ordered, UA unremarkable.     - vanc, cefepime started, continue     - Continue fluids  AMS, multifactorial - sepsis, hepatic encephalopathy     - lactulose enema ordered     - spoke with GI about placing NGT (Dr. [name]); no varicies seen on last EGD, can try NGT placement and start lactulose, rifaximin by tube  Pancytopenia     - check B12, folate     - repeat H&H; no frank bleed noted during exam and his previous values are wildly different within 6 minutes of drawing     - all likely d/t liver dysfxn     - protonix IV  Cirrhosis of liver     - follows w/ Dr. Adriana Mccallum     - INR 1.4 follow     - lactulose, rifaximin     - getting fluid resuscitation for lactic acidosis; reassess in AM for resuming aldactone/lasix  DVT prophylaxis: SCDs  Code Status: FULL  Family Communication: With wife at bedside.  Consults called: Sidebarred Dr. [name] (GI)  Status is: Inpatient  Remains inpatient appropriate because:Inpatient level of care appropriate due to severity of illness   Dispo: The patient is from: Home              Anticipated d/c is to: Home               Anticipated d/c date is: 3 days              Patient currently is not medically stable to d/c.  Jonnie Finner DO [facility]  If 7PM-7AM, please contact night-coverage [email or web address]  06/05/2020, 2:09 PM

## 2020-06-05 NOTE — ED Notes (Signed)
Date and time results received: 06/05/20 4:56 PM   Test: Lactic Acid Critical Value: 2.9  Name of Provider Notified: Marylyn Ishihara MD  Orders Received? Or Actions Taken?: none at this time

## 2020-06-05 NOTE — ED Notes (Signed)
Pt placed on condom cath 

## 2020-06-05 NOTE — Progress Notes (Signed)
Pharmacy Antibiotic Note  Timothy Mckinney is a 69 y.o. male admitted on 06/05/2020 with sepsis.  Pharmacy has been consulted for Vancomycin and Cefepime dosing.  Plan: Cefepime 2gm q8 Vancomycin 1gm x1 in ED, then 1250mg  q12   Height: 5\' 11"  (180.3 cm) Weight: 98.4 kg (217 lb) IBW/kg (Calculated) : 75.3  Temp (24hrs), Avg:97.6 F (36.4 C), Min:97.1 F (36.2 C), Max:98 F (36.7 C)  Recent Labs  Lab 06/05/20 1152 06/05/20 1158 06/05/20 1543  WBC 3.7*  --   --   CREATININE 1.09 1.00  --   LATICACIDVEN 4.8*  --  2.9*    Estimated Creatinine Clearance: 81 mL/min (by C-G formula based on SCr of 1 mg/dL).    Allergies  Allergen Reactions  . Penicillins Swelling    Diffuse swelling    Antimicrobials this admission: 12/6 Vancomycin >>  12/6 Cefepime >>   Dose adjustments this admission:  Microbiology results: 12/6 BCx: sent 12/6 UCx: sent   Thank you for allowing pharmacy to be a part of this patient's care.  Minda Ditto PharmD 06/05/2020 6:50 PM

## 2020-06-05 NOTE — Progress Notes (Signed)
A consult was received from an ED physician for vancomycin and aztreonam  per pharmacy dosing.  The patient's profile has been reviewed for ht/wt/allergies/indication/available labs.    Of note, he reported swelling with PCN but received ceftriaxone in October 2021.  Will change aztreonam to cefepime per hospital policy.  A one time order has been placed for vancomycin 1gm and cefepime 2gm x1.  Further antibiotics/pharmacy consults should be ordered by admitting physician if indicated.                       Thank you, Lynelle Doctor 06/05/2020  2:03 PM

## 2020-06-05 NOTE — ED Provider Notes (Addendum)
[facility] DEPT Provider Note   CSN: [id number] Arrival date & time: 06/05/20  1046     History Chief Complaint  Patient presents with  . Altered Mental Status  . Weakness    Timothy Mckinney is a 69 y.o. male.  Patient is a 69 year old male with a history of hypertension, chronic alcohol abuse, cirrhosis, GERD, thrombocytopenia who presents with altered mental status.  Per EMS, his family reports that he was last seen normal last night before he went to bed.  This morning they noted him to be altered and unable to ambulate with diffuse weakness.  He had a normal blood sugar.  No reported recent illnesses over the last week or so.  He did have a recent hospitalization from October 20 to October 27.  Per chart review, he had presented with hepatic encephalopathy with an elevated ammonia level and was found to be markedly anemic with a hemoglobin of 6.  He required blood transfusion.  He had a EGD at that time which showed no source of the bleeding.  He did have some gastric vascular ectasia without bleeding and some nonbleeding duodenal ulcers.  History is limited due to patient's confusion.        Past Medical History:  Diagnosis Date  . Abnormal chest x-ray    ? granulomata  . Arthritis    knees and back   . DDD (degenerative disc disease), lumbar    MRI 08/2005  . Dry eyes, bilateral    Dr Bing Plume  . Elevated liver enzymes    HISTORY OF ELEVATED LIVER ENZYMES  . GERD (gastroesophageal reflux disease)    hx of   . Hypertension    NEW DIAGNOSIS AND STARTED ON METOPROLOL ON 10/04/11  . Hyperuricemia   . 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 Nephrolithiasis    Dr Serita Butcher  . Sleep apnea    STOP BANG SCORE OF 4  . Thrombocytopenia ([place])    HX OF LOW PLATELET COUNT    Patient Active Problem List   Diagnosis Date Noted  . AMS (altered mental status) 06/05/2020  . A-fib ([place]) 05/22/2020  . Duodenal ulcer 05/22/2020  . Encephalopathy  05/22/2020  . Blood loss anemia 05/22/2020  . Thyroiditis, unspecified 05/22/2020  . Acute hepatic encephalopathy 04/19/2020  . Edema 01/20/2019  . Coagulopathy ([place]) 07/06/2018  . Kidney stones 07/06/2018  . Lower extremity edema 04/30/2018  . Folate deficiency 02/25/2018  . AKI (acute kidney injury) ([place]) 02/25/2018  . Chronic low back pain 02/11/2018  . Alcoholic liver disease ([place]) 07/29/2017  . Other pancytopenia ([place]) 07/16/2017  . PND (post-nasal drip) 06/17/2016  . Generalized anxiety disorder 06/13/2015  . Physical exam 12/09/2014  . Sarcoidosis ([place]) 11/09/2014  . Multiple pulmonary nodules 11/09/2014  . Hilar adenopathy 11/09/2014  . Knee pain   . Pulmonary nodules with adenopathy (since 2001 CT Chest) 10/15/2014  . Obesity (BMI 30-39.9) 08/05/2014  . Hypertension 08/05/2014  . 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 TRANSAMINASES, SERUM, ELEVATED 08/15/2009  . GERD (gastroesophageal reflux disease) 06/12/2009  . NEPHROLITHIASIS, HX OF 06/12/2009  . Hyperuricemia 06/12/2009  . CHEST XRAY, ABNORMAL 06/12/2009    Past Surgical History:  Procedure Laterality Date  . ANAL FISTULECTOMY  06/01/2012   Procedure: FISTULECTOMY ANAL;  Surgeon: Leighton Ruff, MD;  Location: [facility]S;  Service: General;  Laterality: N/A;  Mucosal Advancement Flap   . BIOPSY  04/21/2020  Procedure: BIOPSY;  Surgeon: Carol Ada, MD;  Location: [facility] ENDOSCOPY;  Service: Endoscopy;;  . CARDIAC CATHETERIZATION     in 40s, negative  . colon ulcer  2011  . COLONOSCOPY W/ POLYPECTOMY  2011   Dr Sharlett Iles  . ESOPHAGOGASTRODUODENOSCOPY (EGD) WITH PROPOFOL N/A 04/21/2020   Procedure: ESOPHAGOGASTRODUODENOSCOPY (EGD) WITH PROPOFOL;  Surgeon: Carol Ada, MD;  Location: [facility];  Service: Endoscopy;  Laterality: N/A;  . KNEE ARTHROSCOPY  2009    L  . KNEE SURGERY     april 2013 total knee replacement removed  . LITHOTRIPSY     . tear duct plugs    . TOTAL KNEE ARTHROPLASTY  2011   Workman's Comp case, Dr Veverly Fells       Family History  Problem Relation Age of Onset  . Heart attack Father 13  . Colon polyps Mother   . Dementia Mother        died post SDH  . Cancer Paternal Grandfather        throat , pipe smoker  . Colon polyps Sister   . Cancer Sister        Colon, stage 2  . Hearing loss Brother   . Stroke Neg Hx     Social History   Tobacco Use  . Smoking status: Never Smoker  . Smokeless tobacco: Never Used  Vaping Use  . Vaping Use: Never used  Substance Use Topics  . Alcohol use: Yes    Comment: none in 3 months, prior 4 to 5 times per week  with heavy use  . Drug use: No    Home Medications Prior to Admission medications   Medication Sig Start Date End Date Taking? Authorizing Provider  cycloSPORINE (RESTASIS) 0.05 % ophthalmic emulsion Place 1 drop into both eyes daily.    [provider]  fluticasone (FLONASE) 50 MCG/ACT nasal spray Place 1 spray into both nostrils daily as needed for allergies or rhinitis.    [provider]  folic acid (FOLVITE) 1 MG tablet Take 1 tablet (1 mg total) by mouth daily. 04/26/20   Shelly Coss, MD  furosemide (LASIX) 40 MG tablet Take 1 tablet (40 mg total) by mouth daily. 04/26/20   Shelly Coss, MD  ibuprofen (ADVIL) 800 MG tablet Take 800 mg by mouth every 8 (eight) hours as needed for moderate pain. Patient not taking: Reported on 05/22/2020    [provider]  lactulose (CHRONULAC) 10 GM/15ML solution Take 15 mLs (10 g total) by mouth 2 (two) times daily. 04/26/20   Shelly Coss, MD  levocetirizine (XYZAL) 5 MG tablet Take 5 mg by mouth every evening.    [provider]  metoprolol succinate (TOPROL-XL) 25 MG 24 hr tablet TAKE 1 TABLET(25 MG) BY MOUTH DAILY Patient taking differently: Take 25 mg by mouth daily.  03/27/20   [name], MD  ondansetron (ZOFRAN) 4 MG tablet Take 4 mg by mouth every  8 (eight) hours as needed for nausea or vomiting. Patient not taking: Reported on 05/22/2020    [provider]  oxyCODONE (OXY IR/ROXICODONE) 5 MG immediate release tablet Take 1 tablet (5 mg total) by mouth every 6 (six) hours as needed for moderate pain. Patient not taking: Reported on 05/22/2020 04/26/20   Shelly Coss, MD  pantoprazole (PROTONIX) 40 MG tablet Take 1 tablet (40 mg total) by mouth 2 (two) times daily. 04/26/20   Shelly Coss, MD  potassium chloride SA (KLOR-CON) 20 MEQ tablet Take 1 tablet (20 mEq  total) by mouth daily. 04/26/20   Shelly Coss, MD  rifaximin (XIFAXAN) 550 MG TABS tablet Take 1 tablet (550 mg total) by mouth 2 (two) times daily. 04/26/20   Shelly Coss, MD  spironolactone (ALDACTONE) 50 MG tablet Take 1 tablet (50 mg total) by mouth daily. 04/26/20   Shelly Coss, MD  sucralfate (CARAFATE) 1 g tablet Take 1 tablet (1 g total) by mouth 4 (four) times daily -  with meals and at bedtime. 04/26/20   Shelly Coss, MD  thiamine 100 MG tablet Take 1 tablet (100 mg total) by mouth daily. 04/26/20   Shelly Coss, MD    Allergies    Penicillins  Review of Systems   Review of Systems  Unable to perform ROS: Mental status change    Physical Exam Updated Vital Signs BP (!) 123/58   Pulse 74   Temp (!) 97.1 F (36.2 C) (Rectal)   Resp 16   Ht 5\' 11"  (1.803 m)   Wt 98.4 kg   SpO2 97%   BMI 30.27 kg/m   Physical Exam Constitutional:      Appearance: He is well-developed.  HENT:     Head: Normocephalic.     Comments: Tiny scab to the top of the head. Eyes:     Pupils: Pupils are equal, round, and reactive to light.  Cardiovascular:     Rate and Rhythm: Normal rate and regular rhythm.     Heart sounds: Normal heart sounds.  Pulmonary:     Effort: Pulmonary effort is normal. No respiratory distress.     Breath sounds: Normal breath sounds. No wheezing or rales.  Chest:     Chest wall: No tenderness.  Abdominal:      General: Bowel sounds are normal.     Palpations: Abdomen is soft.     Tenderness: There is no abdominal tenderness. There is no guarding or rebound.  Genitourinary:    Rectum: Guaiac result positive.     Comments: Brown stool with some streaks of blood Musculoskeletal:        General: Normal range of motion.     Comments: He seems to be tense in all his extremities.  He has a slight tremor.  Lymphadenopathy:     Cervical: No cervical adenopathy.  Skin:    General: Skin is warm and dry.     Findings: No rash.  Neurological:     Mental Status: He is alert.     Comments: He is awake with his eyes open but communicate verbally.  He will squeeze my hands somewhat.  He seems to have symmetric grip strength and symmetric withdrawal to his lower extremities on Babinski testing.     ED Results / Procedures / Treatments   Labs (all labs ordered are listed, but only abnormal results are displayed) Labs Reviewed  COMPREHENSIVE METABOLIC PANEL - Abnormal; Notable for the following components:      Result Value   Glucose, Bld 129 (*)    BUN 34 (*)    Total Protein 5.8 (*)    Albumin 2.9 (*)    AST 59 (*)    Total Bilirubin 3.8 (*)    All other components within normal limits  CBC WITH DIFFERENTIAL/PLATELET - Abnormal; Notable for the following components:   WBC 3.7 (*)    RBC 2.66 (*)    Hemoglobin 8.9 (*)    HCT 27.5 (*)    MCV 103.4 (*)    RDW 18.5 (*)    Platelets  79 (*)    Lymphs Abs 0.5 (*)    All other components within normal limits  URINALYSIS, COMPLETE (UACMP) WITH MICROSCOPIC - Abnormal; Notable for the following components:   Ketones, ur 5 (*)    All other components within normal limits  AMMONIA - Abnormal; Notable for the following components:   Ammonia 156 (*)    All other components within normal limits  LACTIC ACID, PLASMA - Abnormal; Notable for the following components:   Lactic Acid, Venous 4.8 (*)    All other components within normal limits  CBG MONITORING,  ED - Abnormal; Notable for the following components:   Glucose-Capillary 107 (*)    All other components within normal limits  I-STAT CHEM 8, ED - Abnormal; Notable for the following components:   BUN 28 (*)    Glucose, Bld 124 (*)    Hemoglobin 7.8 (*)    HCT 23.0 (*)    All other components within normal limits  POC OCCULT BLOOD, ED - Abnormal; Notable for the following components:   Fecal Occult Bld POSITIVE (*)    All other components within normal limits  RESP PANEL BY RT-PCR (FLU A&B, COVID) ARPGX2  CULTURE, BLOOD (ROUTINE X 2)  CULTURE, BLOOD (ROUTINE X 2)  URINE CULTURE  ETHANOL  PROTIME-INR  APTT    EKG EKG Interpretation  Date/Time:  Monday June 05 2020 11:16:34 EST Ventricular Rate:  109 PR Interval:    QRS Duration: 100 QT Interval:  351 QTC Calculation: 473 R Axis:   -30 Text Interpretation: Atrial fibrillation Left axis deviation Low voltage, precordial leads Repol abnrm, severe global ischemia (LM/MVD) Confirmed by Malvin Johns [phone]) on 06/05/2020 2:13:51 PM   Radiology CT HEAD WO CONTRAST  Result Date: 06/05/2020 CLINICAL DATA:  Weakness, altered mental status. EXAM: CT HEAD WITHOUT CONTRAST TECHNIQUE: Contiguous axial images were obtained from the base of the skull through the vertex without intravenous contrast. COMPARISON:  04/19/2020 FINDINGS: Brain: No evidence of acute infarction, hemorrhage, hydrocephalus, extra-axial collection or mass lesion/mass effect. Scattered low-density changes within the periventricular and subcortical white matter compatible with chronic microvascular ischemic change. Mild diffuse cerebral volume loss. Vascular: Atherosclerotic calcifications involving the large vessels of the skull base. No unexpected hyperdense vessel. Skull: Normal. Negative for fracture or focal lesion. Sinuses/Orbits: No acute finding. Other: None. IMPRESSION: 1. No acute intracranial findings. 2. Chronic microvascular ischemic change and cerebral  volume loss. Electronically Signed   By: Davina Poke D.O.   On: 06/05/2020 13:18   DG Chest Port 1 View  Result Date: 06/05/2020 CLINICAL DATA:  Altered mental status. EXAM: PORTABLE CHEST 1 VIEW COMPARISON:  10/15/2014 FINDINGS: The cardiomediastinal silhouette is unchanged. There is persistent elevation of the right hemidiaphragm. The left lung base was incompletely imaged, however there is new hazy airspace opacity in this region with an underlying pleural effusion also possible. The right lung is grossly clear. No pneumothorax is identified. No acute osseous abnormality is seen. IMPRESSION: Left basilar opacity which could reflect atelectasis or pneumonia, potentially with a small pleural effusion. Consider further evaluation with PA and lateral chest radiographs when clinically able. Electronically Signed   By: Logan Bores M.D.   On: 06/05/2020 12:35    Procedures Procedures (including critical care time)  Medications Ordered in ED Medications  lactated ringers infusion (has no administration in time range)  lactated ringers bolus 1,000 mL (has no administration in time range)    And  lactated ringers bolus 1,000 mL (has no administration  in time range)    And  lactated ringers bolus 1,000 mL (has no administration in time range)  vancomycin (VANCOCIN) IVPB 1000 mg/200 mL premix (has no administration in time range)  ceFEPIme (MAXIPIME) 2 g in sodium chloride 0.9 % 100 mL IVPB (has no administration in time range)  lactulose (CHRONULAC) enema 200 gm (has no administration in time range)    ED Course  I have reviewed the triage vital signs and the nursing notes.  Pertinent labs & imaging results that were available during my care of the patient were reviewed by me and considered in my medical decision making (see chart for details).    MDM Rules/Calculators/A&P                          Patient is a 69 year old male who presents with altered mental status.  His head CT shows  no acute abnormality.  His ammonia level was markedly elevated and he was started on lactulose.  This is likely the etiology of his altered mental status.  However he did have a slightly low WBC count and a markedly elevated lactate.  Given this in association with his altered mental status, he was started on sepsis protocol.  He was given IV fluids and IV antibiotics.  He had a head CT which showed no acute abnormality.  His chest x-ray shows possible infiltrate.  This was reviewed by me as well.  He does have a drop in his hemoglobin.  It has improved from his last ED visit but has dropped since he had received a blood transfusion.  His rectal exam does show some brown stool streaked with blood.  This was Hemoccult positive.  His blood pressures been stable.  I spoke with Dr. Marylyn Ishihara with the hospitalist service to admit the patient for further treatment.  CRITICAL CARE Performed by: Malvin Johns Total critical care time: 70 minutes Critical care time was exclusive of separately billable procedures and treating other patients. Critical care was necessary to treat or prevent imminent or life-threatening deterioration. Critical care was time spent personally by me on the following activities: development of treatment plan with patient and/or surrogate as well as nursing, discussions with consultants, evaluation of patient's response to treatment, examination of patient, obtaining history from patient or surrogate, ordering and performing treatments and interventions, ordering and review of laboratory studies, ordering and review of radiographic studies, pulse oximetry and re-evaluation of patient's condition.   Final Clinical Impression(s) / ED Diagnoses Final diagnoses:  Hepatic encephalopathy ([place])  Anemia, unspecified type  Sepsis, due to unspecified organism, unspecified whether acute organ dysfunction present [facility])    Rx / DC Orders ED Discharge Orders    None       Malvin Johns,  MD 06/05/20 1413    Malvin Johns, MD 06/05/20 1414

## 2020-06-06 ENCOUNTER — Other Ambulatory Visit: Payer: Self-pay

## 2020-06-06 ENCOUNTER — Other Ambulatory Visit: Payer: PPO

## 2020-06-06 DIAGNOSIS — K7290 Hepatic failure, unspecified without coma: Principal | ICD-10-CM

## 2020-06-06 LAB — C DIFFICILE QUICK SCREEN W PCR REFLEX
C Diff antigen: POSITIVE — AB
C Diff toxin: NEGATIVE

## 2020-06-06 LAB — COMPREHENSIVE METABOLIC PANEL
ALT: 24 U/L (ref 0–44)
AST: 52 U/L — ABNORMAL HIGH (ref 15–41)
Albumin: 2.3 g/dL — ABNORMAL LOW (ref 3.5–5.0)
Alkaline Phosphatase: 76 U/L (ref 38–126)
Anion gap: 8 (ref 5–15)
BUN: 29 mg/dL — ABNORMAL HIGH (ref 8–23)
CO2: 20 mmol/L — ABNORMAL LOW (ref 22–32)
Calcium: 8.7 mg/dL — ABNORMAL LOW (ref 8.9–10.3)
Chloride: 106 mmol/L (ref 98–111)
Creatinine, Ser: 0.73 mg/dL (ref 0.61–1.24)
GFR, Estimated: >60 mL/min (ref >60)
Glucose, Bld: 114 mg/dL — ABNORMAL HIGH (ref 70–99)
Potassium: 4 mmol/L (ref 3.5–5.1)
Sodium: 134 mmol/L — ABNORMAL LOW (ref 135–145)
Total Bilirubin: 4 mg/dL — ABNORMAL HIGH (ref 0.3–1.2)
Total Protein: 4.8 g/dL — ABNORMAL LOW (ref 6.5–8.1)

## 2020-06-06 LAB — CLOSTRIDIUM DIFFICILE BY PCR, REFLEXED: Toxigenic C. Difficile by PCR: NEGATIVE

## 2020-06-06 LAB — CBC
HCT: 24.2 % — ABNORMAL LOW (ref 39.0–52.0)
Hemoglobin: 7.8 g/dL — ABNORMAL LOW (ref 13.0–17.0)
MCH: 32.8 pg (ref 26.0–34.0)
MCHC: 32.2 g/dL (ref 30.0–36.0)
MCV: 101.7 fL — ABNORMAL HIGH (ref 80.0–100.0)
Platelets: 61 10*3/uL — ABNORMAL LOW (ref 150–400)
RBC: 2.38 MIL/uL — ABNORMAL LOW (ref 4.22–5.81)
RDW: 18.6 % — ABNORMAL HIGH (ref 11.5–15.5)
WBC: 5.3 10*3/uL (ref 4.0–10.5)
nRBC: 0 % (ref 0.0–0.2)

## 2020-06-06 LAB — URINE CULTURE: Culture: NO GROWTH

## 2020-06-06 LAB — PROTIME-INR
INR: 1.7 — ABNORMAL HIGH (ref 0.8–1.2)
Prothrombin Time: 19.8 seconds — ABNORMAL HIGH (ref 11.4–15.2)

## 2020-06-06 LAB — PROCALCITONIN: Procalcitonin: 0.2 ng/mL

## 2020-06-06 LAB — CORTISOL-AM, BLOOD: Cortisol - AM: 12.5 ug/dL (ref 6.7–22.6)

## 2020-06-06 LAB — LACTIC ACID, PLASMA: Lactic Acid, Venous: 1.6 mmol/L (ref 0.5–1.9)

## 2020-06-06 MED ORDER — SUCRALFATE 1 G PO TABS
1.0000 g | ORAL_TABLET | Freq: Three times a day (TID) | ORAL | Status: DC
  Administered 2020-06-06 – 2020-06-10 (×12): 1 g via ORAL
  Filled 2020-06-06 (×12): qty 1

## 2020-06-06 MED ORDER — ONDANSETRON HCL 4 MG/2ML IJ SOLN
4.0000 mg | Freq: Four times a day (QID) | INTRAMUSCULAR | Status: DC | PRN
  Administered 2020-06-06: 4 mg via INTRAVENOUS
  Filled 2020-06-06: qty 2

## 2020-06-06 MED ORDER — VANCOMYCIN HCL IN DEXTROSE 1-5 GM/200ML-% IV SOLN
1000.0000 mg | Freq: Two times a day (BID) | INTRAVENOUS | Status: DC
  Administered 2020-06-06: 1000 mg via INTRAVENOUS
  Filled 2020-06-06: qty 200

## 2020-06-06 MED ORDER — LACTULOSE 10 GM/15ML PO SOLN
20.0000 g | Freq: Three times a day (TID) | ORAL | Status: DC
  Administered 2020-06-06 – 2020-06-10 (×12): 20 g
  Filled 2020-06-06 (×12): qty 30

## 2020-06-06 MED ORDER — PANTOPRAZOLE SODIUM 40 MG PO TBEC: 40.0000 mg | DELAYED_RELEASE_TABLET | Freq: Two times a day (BID) | ORAL | Status: DC

## 2020-06-06 MED ORDER — THIAMINE HCL 100 MG/ML IJ SOLN
100.0000 mg | Freq: Every day | INTRAMUSCULAR | Status: DC
  Administered 2020-06-06: 100 mg via INTRAVENOUS
  Filled 2020-06-06: qty 2

## 2020-06-06 MED ORDER — ONDANSETRON HCL 4 MG PO TABS
4.0000 mg | ORAL_TABLET | Freq: Four times a day (QID) | ORAL | Status: DC | PRN
  Administered 2020-06-09: 4 mg via ORAL
  Filled 2020-06-06: qty 1

## 2020-06-06 MED ORDER — FOLIC ACID 1 MG PO TABS
1.0000 mg | ORAL_TABLET | Freq: Every day | ORAL | Status: DC
  Administered 2020-06-06 – 2020-06-10 (×5): 1 mg via ORAL
  Filled 2020-06-06 (×5): qty 1

## 2020-06-06 MED ORDER — PANTOPRAZOLE SODIUM 40 MG IV SOLR
40.0000 mg | Freq: Two times a day (BID) | INTRAVENOUS | Status: DC
  Administered 2020-06-06 – 2020-06-07 (×3): 40 mg via INTRAVENOUS
  Filled 2020-06-06 (×3): qty 40

## 2020-06-06 MED ORDER — ACETAMINOPHEN 325 MG PO TABS
650.0000 mg | ORAL_TABLET | Freq: Four times a day (QID) | ORAL | Status: DC | PRN
  Administered 2020-06-06: 650 mg via ORAL
  Filled 2020-06-06: qty 2

## 2020-06-06 MED ORDER — ACETAMINOPHEN 650 MG RE SUPP: 650.0000 mg | Freq: Four times a day (QID) | RECTAL | Status: DC | PRN

## 2020-06-06 MED ORDER — SODIUM CHLORIDE 0.9 % IV SOLN
2.0000 g | INTRAVENOUS | Status: DC
  Administered 2020-06-06 – 2020-06-09 (×4): 2 g via INTRAVENOUS
  Filled 2020-06-06 (×2): qty 2
  Filled 2020-06-06: qty 20
  Filled 2020-06-06: qty 2

## 2020-06-06 MED ORDER — FLUTICASONE PROPIONATE 50 MCG/ACT NA SUSP
1.0000 | Freq: Every day | NASAL | Status: DC | PRN
  Filled 2020-06-06: qty 16

## 2020-06-06 MED ORDER — RIFAXIMIN 550 MG PO TABS: 550.0000 mg | ORAL_TABLET | Freq: Two times a day (BID) | ORAL | Status: DC

## 2020-06-06 MED ORDER — FUROSEMIDE 40 MG PO TABS
40.0000 mg | ORAL_TABLET | Freq: Every day | ORAL | Status: DC
  Administered 2020-06-07 – 2020-06-10 (×4): 40 mg via ORAL
  Filled 2020-06-06 (×4): qty 1

## 2020-06-06 MED ORDER — CYCLOSPORINE 0.05 % OP EMUL
1.0000 [drp] | Freq: Every day | OPHTHALMIC | Status: DC
  Administered 2020-06-06 – 2020-06-10 (×4): 1 [drp] via OPHTHALMIC
  Filled 2020-06-06 (×5): qty 1

## 2020-06-06 MED ORDER — SPIRONOLACTONE 25 MG PO TABS
100.0000 mg | ORAL_TABLET | Freq: Every day | ORAL | Status: DC
  Administered 2020-06-07 – 2020-06-10 (×4): 100 mg via ORAL
  Filled 2020-06-06 (×4): qty 4

## 2020-06-06 NOTE — Progress Notes (Signed)
[facility] Progress Note  Patient: Timothy Mckinney    WNU:[id number]  DOA: 06/05/2020     Date of Service: the patient was seen and examined on 06/06/2020  Brief hospital course: Past medical history of cirrhosis, GERD, arthritis, thrombocytopenia, OSA.  Presents with complaints of generalized fatigue, confusion.  Found to have hepatic encephalopathy.  Also concern for sepsis of unknown etiology. Currently plan is continue treating encephalopathy, monitor H&H and follow-up on cultures.  Assessment and Plan: 1.  Hepatic encephalopathy Liver cirrhosis Etiology still not clear based on the history as family reports that the patient was compliant with medication. Patient was not awake enough and therefore required an NG tube which he pulled out. Continue lactulose continue rifaximin. Unable to afford rifaximin outpatient. Follows up with [facility] but wants to establish care with [facility] in case he needs GI work-up. Also [facility]. Resume Aldactone Lasix Tomorrow. Currently no asterixis  2.  Sepsis physiology POA. Likely aspiration pneumonia. Hypothermic, tachycardic with leukocytosis Initially on IV vancomycin and cefepime. Possible aspiration pneumonia Discontinue blood antibiotics.  Currently on ceftriaxone. Low threshold to discontinue antibiotics or narrow to oral antibiotics. Follow-up on cultures. Received IV fluids.  Currently blood pressure improving.  3.  Pancytopenia In the setting of liver cirrhosis. Monitor for now. On Protonix twice daily. Prior history of duodenal ulcer as well as gave and portal hypertensive gastropathy which can be potentially causing bleeding. If the H&H continues to drop will require GI consultation for endoscopy. Continue clear liquid diet only for now.  4.  Generalized deconditioning. PT OT consulted.  Monitor.  Likely secondary to combination of anemia and encephalopathy.  5.  obesity Placing the patient  at risk for poor outcomes. Body mass index is 31.89 kg/m.  Nutrition Problem: Inadequate oral intake Etiology: acute illness, lethargy/confusion Interventions: Interventions: Refer to RD note for recommendations      Diet: Low-sodium diet DVT Prophylaxis:   SCDs Start: 06/06/20 0140 Place TED hose Start: 06/05/20 1520    Advance goals of care discussion: Full code  Family Communication: family was present at bedside, at the time of interview.  The pt provided permission to discuss medical plan with the family. Opportunity was given to ask question and all questions were answered satisfactorily.   Disposition:  Status is: Inpatient  Remains inpatient appropriate because:Inpatient level of care appropriate due to severity of illness   Dispo: The patient is from: Home              Anticipated d/c is to: SNF              Anticipated d/c date is: 3 days              Patient currently is not medically stable to d/c.        Subjective: No nausea no vomiting.  No fever no chills.  Reports fatigue and tiredness.  Physical Exam:  General: Appear in mild distress, no Rash; Oral Mucosa Clear, moist. no Abnormal Neck Mass Or lumps, Conjunctiva normal  Cardiovascular: S1 and S2 Present, no Murmur, Respiratory: good respiratory effort, Bilateral Air entry present and CTA, no Crackles, no wheezes Abdomen: Bowel Sound present, Soft and distended, no tenderness Extremities: bilateral  Pedal edema Neurology: alert and oriented to time, place, and person affect appropriate. no new focal deficit, no asterixis present.  Gait not checked due to patient safety concerns  Vitals:   06/06/20 0155 06/06/20 0202 06/06/20 0549 06/06/20 1326  BP: (!) 128/55  (!) 133/47 (!) 120/46  Pulse: (!) 110  89 75  Resp: 16  16 16   Temp: 98 F (36.7 C)  98.3 F (36.8 C) 98.6 F (37 C)  TempSrc:   Oral Oral  SpO2: 99%  99% 98%  Weight:  103.7 kg    Height:  5\' 11"  (1.803 m)      Intake/Output  Summary (Last 24 hours) at 06/06/2020 2126 Last data filed at 06/06/2020 0630 Gross per 24 hour  Intake 352.48 ml  Output 800 ml  Net -447.52 ml   Filed Weights   06/05/20 1103 06/06/20 0202  Weight: 98.4 kg 103.7 kg    Data Reviewed: I have personally reviewed and interpreted daily labs, tele strips, imagings as discussed above. I reviewed all nursing notes, pharmacy notes, vitals, pertinent old records I have discussed plan of care as described above with RN and patient/family.  CBC: Recent Labs  Lab 06/05/20 1152 06/05/20 1158 06/05/20 1904 06/06/20 0508  WBC 3.7*  --  4.2 5.3  NEUTROABS 2.9  --  3.4  --   HGB 8.9* 7.8* 8.2* 7.8*  HCT 27.5* 23.0* 25.0* 24.2*  MCV 103.4*  --  100.4* 101.7*  PLT 79*  --  69* 61*   Basic Metabolic Panel: Recent Labs  Lab 06/05/20 1152 06/05/20 1158 06/06/20 0508  NA 138 137 134*  K 4.8 4.6 4.0  CL 104 103 106  CO2 22  --  20*  GLUCOSE 129* 124* 114*  BUN 34* 28* 29*  CREATININE 1.09 1.00 0.73  CALCIUM 9.3  --  8.7*    Studies: No results found.  Scheduled Meds: . cycloSPORINE  1 drop Both Eyes Daily  . folic acid  1 mg Oral Daily  . [START ON 06/07/2020] furosemide  40 mg Oral Daily  . lactulose  20 g Per Tube TID  . pantoprazole (PROTONIX) IV  40 mg Intravenous Q12H  . rifaximin  550 mg Oral BID  . [START ON 06/07/2020] spironolactone  100 mg Oral Daily  . sucralfate  1 g Oral TID WC & HS   Continuous Infusions: . cefTRIAXone (ROCEPHIN)  IV     PRN Meds: acetaminophen **OR** acetaminophen, fluticasone, ondansetron **OR** ondansetron (ZOFRAN) IV  Time spent: 35 minutes  Author: Berle Mull, MD [facility] 06/06/2020 9:26 PM  To reach On-call, see care teams to locate the attending and reach out via [email or web address]. Between 7PM-7AM, please contact night-coverage If you still have difficulty reaching the attending provider, please page the [facility] (Director on Call) for [facility] on amion for assistance.

## 2020-06-06 NOTE — Progress Notes (Signed)
Initial Nutrition Assessment  RD working remotely.  DOCUMENTATION CODES:   Obesity unspecified  INTERVENTION:  - diet advancement as medically feasible.  NUTRITION DIAGNOSIS:   Inadequate oral intake related to acute illness, lethargy/confusion as evidenced by per patient/family report.  GOAL:   Patient will meet greater than or equal to 90% of their needs  MONITOR:   Diet advancement, Labs, Weight trends, I & O's  REASON FOR ASSESSMENT:   Malnutrition Screening Tool  ASSESSMENT:   69 y.o. male with medical history of cirrhosis, HTN, sleep apnea, GERD, arthritis, and DDD. He presented to the ED with AMS and was in the ED recently with similar symptoms.  Patient has been NPO since admission.   MST report note indicates that it was reported that patient has lost ~10 lb recently.   Weight today is 229 lb and weight yesterday documented as 217 lb and appears to be a stated weight. Weight on 11/22 was 217 lb, weight on 10/27 was 219 lb, and weight on 8/4 was 237 lb. Will monitor weight closely.  Very deep pitting edema to BLE documented in the edema section of flow sheet.  Per notes: - EEG done 12/6 and showed moderate diffuse encephalopathy, no seizures - sepsis with unknown source - AMS, multifactorial - pancytopenia - liver cirrhosis    Labs reviewed; Na: 134 mmol/l, BUN: 29 mg/dl, Ca: 8.7 mg/dl, AST slightly elevated.  Medications reviewed; 20 g lactulose TID, 40 mg IV protonix BID, 100 mg IV thiamine/day.    NUTRITION - FOCUSED PHYSICAL EXAM:  unable to complete at this time.  Diet Order:   Diet Order    None      EDUCATION NEEDS:   Not appropriate for education at this time  Skin:  Skin Assessment: Reviewed RN Assessment  Last BM:  12/7 (type 6)  Height:   Ht Readings from Last 1 Encounters:  06/06/20 5\' 11"  (1.803 m)    Weight:   Wt Readings from Last 1 Encounters:  06/06/20 103.7 kg     Estimated Nutritional Needs:  Kcal:   1950-2150 kcal Protein:  100-110 grams Fluid:  >/= 1.5 L/day     Jarome Matin, MS, RD, LDN, CNSC Inpatient Clinical Dietitian RD pager # available in AMION  After hours/weekend pager # available in [facility]

## 2020-06-06 NOTE — Progress Notes (Addendum)
Pharmacy Antibiotic Note  Timothy Mckinney is a 69 y.o. male admitted on 06/05/2020 with sepsis.  Pharmacy has been consulted for Vancomycin and Cefepime dosing.  Today, 06/06/2020:  D1 full abx  WBC normalized  Afebrile since admission  PCT unremarkable on admission  Lactic acid normalized  SCr returned to baseline  Plan:  Continue cefepime 2gm q8 hr  Adjust vancomycin to 1000 mg IV q12 hr with change in SCr   Height: 5\' 11"  (180.3 cm) Weight: 103.7 kg (228 lb 9.9 oz) IBW/kg (Calculated) : 75.3  Temp (24hrs), Avg:97.9 F (36.6 C), Min:97.1 F (36.2 C), Max:98.3 F (36.8 C)  Recent Labs  Lab 06/05/20 1152 06/05/20 1158 06/05/20 1543 06/05/20 1900 06/05/20 1904 06/06/20 0508  WBC 3.7*  --   --   --  4.2 5.3  CREATININE 1.09 1.00  --   --   --  0.73  LATICACIDVEN 4.8*  --  2.9* 2.7* 2.9* 1.6    Estimated Creatinine Clearance: 103.9 mL/min (by C-G formula based on SCr of 0.73 mg/dL).    Allergies  Allergen Reactions  . Penicillins Swelling    Diffuse swelling    Antimicrobials this admission: 12/6 Vancomycin >>  12/6 Cefepime >>   Dose adjustments this admission: 12/7 adj vanc 1250 >> 1000 q12 with chg in renal function  Microbiology results: 12/6 BCx: sent 12/6 UCx: sent   Thank you for allowing pharmacy to be a part of this patient's care.  Lakeria Starkman A PharmD 06/06/2020 7:34 AM

## 2020-06-06 NOTE — Progress Notes (Signed)
Pt attempted to get pt several times to bedside commode, too weak, requires 2 assist. Spouse concerned of increased weakness. SRP, RN

## 2020-06-06 NOTE — Plan of Care (Signed)
  Problem: Education: Goal: Knowledge of General Education information will improve Description: Including pain rating scale, medication(s)/side effects and non-pharmacologic comfort measures Outcome: Progressing   Problem: Clinical Measurements: Goal: Ability to maintain clinical measurements within normal limits will improve Outcome: Progressing Goal: Cardiovascular complication will be avoided Outcome: Progressing   

## 2020-06-06 NOTE — ED Notes (Signed)
Attempted to call report to floor x1. 

## 2020-06-06 NOTE — Progress Notes (Signed)
  Results for [name], [name]" (MRN [id number]) as of 06/06/2020 16:09  Ref. Range 06/05/2020 19:00 06/05/2020 19:04 06/06/2020 05:08 06/06/2020 12:30 06/06/2020 15:19  C Diff antigen Latest Ref Range: NEGATIVE      POSITIVE (A)  C Diff interpretation Unknown     Results are indeterminate. See PCR results.  C Diff toxin Latest Ref Range: NEGATIVE      NEGATIVE    MD updated of results. SRP, RN

## 2020-06-06 NOTE — ED Notes (Signed)
Report called to Junior RN, pt will be transported on stretcher with tele monitoring.

## 2020-06-07 LAB — AMMONIA: Ammonia: 45 umol/L — ABNORMAL HIGH (ref 9–35)

## 2020-06-07 MED ORDER — THIAMINE HCL 100 MG PO TABS
100.0000 mg | ORAL_TABLET | Freq: Every day | ORAL | Status: DC
  Administered 2020-06-07 – 2020-06-10 (×4): 100 mg via ORAL
  Filled 2020-06-07 (×4): qty 1

## 2020-06-07 MED ORDER — PANTOPRAZOLE SODIUM 40 MG PO TBEC
40.0000 mg | DELAYED_RELEASE_TABLET | Freq: Two times a day (BID) | ORAL | Status: DC
  Administered 2020-06-07 – 2020-06-10 (×6): 40 mg via ORAL
  Filled 2020-06-07 (×6): qty 1

## 2020-06-07 MED ORDER — VITAMIN K1 10 MG/ML IJ SOLN
10.0000 mg | Freq: Once | INTRAVENOUS | Status: AC
  Administered 2020-06-07: 10 mg via INTRAVENOUS
  Filled 2020-06-07: qty 1

## 2020-06-07 NOTE — Plan of Care (Signed)
  Problem: Education: ?Goal: Knowledge of General Education information will improve ?Description: Including pain rating scale, medication(s)/side effects and non-pharmacologic comfort measures ?Outcome: Progressing ?  ?Problem: Clinical Measurements: ?Goal: Will remain free from infection ?Outcome: Progressing ?Goal: Respiratory complications will improve ?Outcome: Progressing ?  ?Problem: Activity: ?Goal: Risk for activity intolerance will decrease ?Outcome: Progressing ?  ?Problem: Coping: ?Goal: Level of anxiety will decrease ?Outcome: Progressing ?  ?

## 2020-06-07 NOTE — Evaluation (Signed)
Physical Therapy Evaluation Patient Details Name: Timothy Mckinney MRN: [id number] DOB: 1951/05/08 Today's Date: 06/07/2020   History of Present Illness  64 uo male admitted with AMS, acute hepatic encepalopathy, weakness, elevated ammonia levels, sepsis. Hx of DDD, sarcoidosis, ETOH abuse, liver cirrhosis, OA  Clinical Impression  On eval, pt required Min assist for mobility. He walked ~5 feet after pivoting to bsc from bed. Hallway ambulation deferred 2* bowel incontinence/loose stools 2* lactulose. Pt presents with general weakness, decreased activity tolerance, and impaired gait and balance. He requires cues and is a little slow to process tasks. Wife was present during session. Discussed d/c plan-both pt and wife are hopeful to d/c home with HHPT f/u. Will plan to follow pt and progress activity as tolerated.     Follow Up Recommendations Home health PT;Supervision/Assistance - 24 hour    Equipment Recommendations  None recommended by PT    Recommendations for Other Services       Precautions / Restrictions Precautions Precautions: Fall Precaution Comments: bowel incontinence(lactulose) Restrictions Weight Bearing Restrictions: No      Mobility  Bed Mobility Overal bed mobility: Needs Assistance Bed Mobility: Supine to Sit     Supine to sit: Min guard;HOB elevated     General bed mobility comments: Min guard for safety. Increased time. Cues required    Transfers Overall transfer level: Needs assistance Equipment used: Rolling walker (2 wheeled) Transfers: Sit to/from Stand Sit to Stand: Min assist;From elevated surface         General transfer comment: Assist to rise, stabilize, control descent. Increased time. Cues for safety, technique, hand placement. Stand pivot, bed to bsc, with RW.  Ambulation/Gait Ambulation/Gait assistance: Min assist Gait Distance (Feet): 5 Feet Assistive device: Rolling walker (2 wheeled) Gait Pattern/deviations: Step-through  pattern;Decreased stride length     General Gait Details: Assist to stabilize and manage with RW. Cues for safety, proper use of RW. Deferred hallway ambulation 2* bowel incontinence/loose stools  Stairs            Wheelchair Mobility    Modified Rankin (Stroke Patients Only)       Balance Overall balance assessment: Needs assistance;History of Falls         Standing balance support: Bilateral upper extremity supported Standing balance-Leahy Scale: Poor                               Pertinent Vitals/Pain Pain Assessment: Faces Faces Pain Scale: No hurt    Home Living Family/patient expects to be discharged to:: Private residence Living Arrangements: Spouse/significant other Available Help at Discharge: Family Type of Home: House Home Access: Stairs to enter Entrance Stairs-Rails: Right Entrance Stairs-Number of Steps: 4 Home Layout: Multi-level;Bed/bath upstairs Home Equipment: Walker - 2 wheels      Prior Function Level of Independence: Independent         Comments: Ind since discharging from rehab ~3 weeks PTA     Hand Dominance        Extremity/Trunk Assessment   Upper Extremity Assessment Upper Extremity Assessment: Defer to OT evaluation    Lower Extremity Assessment Lower Extremity Assessment: Generalized weakness    Cervical / Trunk Assessment Cervical / Trunk Assessment: Normal  Communication   Communication: No difficulties  Cognition Arousal/Alertness: Awake/alert Behavior During Therapy: WFL for tasks assessed/performed Overall Cognitive Status: Impaired/Different from baseline Area of Impairment: Problem solving;Following commands  Following Commands: Follows one step commands with increased time     Problem Solving: Requires verbal cues;Slow processing        General Comments      Exercises     Assessment/Plan    PT Assessment Patient needs continued PT services  PT  Problem List Decreased strength;Decreased mobility;Decreased activity tolerance;Decreased balance;Decreased cognition;Decreased knowledge of use of DME       PT Treatment Interventions DME instruction;Gait training;Therapeutic activities;Therapeutic exercise;Patient/family education;Balance training;Functional mobility training    PT Goals (Current goals can be found in the Care Plan section)  Acute Rehab PT Goals Patient Stated Goal: per wife, for pt to mobilize as much as possible here so he can d/c home PT Goal Formulation: With patient/family Time For Goal Achievement: 06/21/20 Potential to Achieve Goals: Good    Frequency Min 3X/week   Barriers to discharge        Co-evaluation               AM-PAC PT "6 Clicks" Mobility  Outcome Measure Help needed turning from your back to your side while in a flat bed without using bedrails?: A Little Help needed moving from lying on your back to sitting on the side of a flat bed without using bedrails?: A Little Help needed moving to and from a bed to a chair (including a wheelchair)?: A Little Help needed standing up from a chair using your arms (e.g., wheelchair or bedside chair)?: A Little Help needed to walk in hospital room?: A Little Help needed climbing 3-5 steps with a railing? : A Lot 6 Click Score: 17    End of Session Equipment Utilized During Treatment: Gait belt Activity Tolerance: Patient tolerated treatment well Patient left: in chair;with call bell/phone within reach;with chair alarm set;with family/visitor present   PT Visit Diagnosis: Muscle weakness (generalized) (M62.81);History of falling (Z91.81);Unsteadiness on feet (R26.81)    Time: 1100-1130 PT Time Calculation (min) (ACUTE ONLY): 30 min   Charges:   PT Evaluation $PT Eval Moderate Complexity: [place], PT Acute Rehabilitation  Office: [phone] Pager: [phone]

## 2020-06-07 NOTE — TOC Progression Note (Signed)
Transition of Care [facility]) - Progression Note    Patient Details  Name: Timothy Mckinney MRN: [id number] Date of Birth: [birth year]  [facility]) CM/SW Contact  Purcell Mouton, RN Phone Number: 06/07/2020, 2:50 PM  Clinical Narrative:     Spoke with pt's wife Timothy Mckinney concerning Rifaximin. Asked if pt had tried the [employer] for coupon. Wife stated no. This CM checked [email or web address]/, pt was not eligible related to Medicare. Pt's wife was given two-numbers to try for medication assistance. At present time all discounts are 1-2 thousand dollars. TOC CMA is checking co-pay.       Expected Discharge Plan and Services                                                 Social Determinants of Health (SDOH) Interventions    Readmission Risk Interventions No flowsheet data found.

## 2020-06-07 NOTE — TOC Benefit Eligibility Note (Signed)
Transition of Care [facility]) Benefit Eligibility Note    Patient Details  Name: Timothy Mckinney MRN: [id number] Date of Birth: Aug 11, 1950   Medication/Dose: RIFAXIMIN 550 MG BID  Covered?: Yes  Tier:  (5 DRUG)  Prescription Coverage Preferred Pharmacy: [facility] with Person/Company/Phone Number:: TERESA  @ [employer] # [phone] OPT-2  Co-Pay: $109.37  Prior Approval: No  Deductible: Met  Additional Notes: LAST REFILL May 11, 2020    Memory Argue Phone Number: 06/07/2020, 3:43 PM

## 2020-06-07 NOTE — Progress Notes (Signed)
PROGRESS NOTE  Timothy Mckinney JZP:[id number] DOB: Oct 19, 1950 DOA: 06/05/2020 PCP: Timothy Minium, MD   LOS: 2 days   Brief Narrative / Interim history: This is a 69 year old gentleman with EtOH induced liver cirrhosis, comes to the hospital with complaints of increased confusion.  Wife tells me that he has been generally "slow" recently, but few days ago she was unable to converse with him and he was extremely lethargic and he was brought to the hospital.  Apparently there is reports of patient not taking his lactulose as he should.  Based on prior notes, patient had diarrhea and held lactulose for a day.  In the ED he was hypothermic, tachycardic and had decreased WBC and there was concern for infectious process  Subjective / 24h Interval events: Feels well this morning.  He denies any shortness of breath, denies any chest pain.  No abdominal pain, no nausea or vomiting.  No blood in the stool or black stools.  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  He was given a prescription of rifaximin symptoms in the past however wife tells me that it is very expensive and he is not taking that currently -Case discussed with Dr. Collene Mckinney with gastroenterology over the phone, she will try see if they are able to work his rifaximin to be covered -Patient does not seem to have an active infectious process currently, he has no respiratory symptoms, no abdominal pain or dysuria  Active Problems SIRS, unlikely sepsis -Treated with antibiotics with ceftriaxone, continue that for now while awaiting cultures.  There was concern for aspiration pneumonia but he has no respiratory symptoms.  There was concern for intra-abdominal source but he is  completely asymptomatic.  Anemia, thrombocytopenia, history of GI bleed -Possibly in the setting of underlying liver disease.  He was hospitalized recently with GI bleed and underwent EGD at the end of October 2021 which showed normal esophagus, gastric antral vascular ectasia without bleeding, portal hypertensive gastropathy as well as nonbleeding duodenal ulcers.  He was placed on PPI which he is to continue -Hemoglobin low but no overt bleeding.  Fecal occult is positive which can be from his portal hypertensive gastropathy.  Will monitor, if hemoglobin continues to drop will formally consult gastroenterology -Meld is 20  Coagulopathy -Due to liver disease, INR slightly higher today, will give vitamin K given positive fecal occult blood as well as anemia  Paroxysmal A. fib -Observe during her hospital stay, due to coagulopathy was deemed not a candidate for anticoagulation  History of alcohol abuse -Sober for 4 months now, continue folic acid, thiamine.  There was concern for Warnicke's encephalopathy during his prior hospital stay and received high-dose thiamine  Severe protein calorie malnutrition -Encourage p.o. intake  History of sarcoidosis -Currently stable  Scheduled Meds: . cycloSPORINE  1 drop Both Eyes Daily  . folic acid  1 mg Oral Daily  . furosemide  40 mg Oral Daily  . lactulose  20 g Per Tube TID  . pantoprazole  40 mg Oral BID  . rifaximin  550 mg Oral BID  . spironolactone  100 mg Oral Daily  . sucralfate  1 g Oral TID WC & HS   Continuous Infusions: . cefTRIAXone (ROCEPHIN)  IV 2 g (06/06/20 2135)  PRN Meds:.acetaminophen **OR** acetaminophen, fluticasone, ondansetron **OR** ondansetron (ZOFRAN) IV  Diet Orders (From admission, onward)    Start     Ordered   06/07/20 0909  DIET SOFT Room service appropriate? Yes; Fluid consistency: Thin  Diet effective now       Question Answer Comment  Room service appropriate? Yes   Fluid consistency: Thin       06/07/20 0908          DVT prophylaxis: SCDs Start: 06/06/20 0140 Place TED hose Start: 06/05/20 1520     Code Status: Full Code  Family Communication: Wife at bedside  Status is: Inpatient  Remains inpatient appropriate because:Altered mental status   Dispo: The patient is from: Home              Anticipated d/c is to: Home              Anticipated d/c date is: 2 days              Patient currently is not medically stable to d/c.  Consultants:  None   Procedures:  None   Microbiology  None   Antimicrobials: Ceftriaxone     Objective: Vitals:   06/06/20 0549 06/06/20 1326 06/06/20 2236 06/07/20 0555  BP: (!) 133/47 (!) 120/46 (!) 130/56 (!) 121/51  Pulse: 89 75 75 75  Resp: 16 16 16 19   Temp: 98.3 F (36.8 C) 98.6 F (37 C) 98.7 F (37.1 C) 98 F (36.7 C)  TempSrc: Oral Oral Oral Oral  SpO2: 99% 98% 95% 95%  Weight:      Height:        Intake/Output Summary (Last 24 hours) at 06/07/2020 1357 Last data filed at 06/07/2020 1000 Gross per 24 hour  Intake 340 ml  Output 1400 ml  Net -1060 ml   Filed Weights   06/05/20 1103 06/06/20 0202  Weight: 98.4 kg 103.7 kg    Examination:  Constitutional: No apparent distress, jaundiced Eyes: + scleral icterus ENMT: Mucous membranes are moist.  Neck: normal, supple Respiratory: clear to auscultation bilaterally, no wheezing, no crackles. Normal respiratory effort.  Cardiovascular: Regular rate and rhythm, no murmurs / rubs / gallops.  Trace edema Abdomen: non distended, no tenderness. Bowel sounds positive.  Musculoskeletal: no clubbing / cyanosis.  Skin: no rashes Neurologic: CN 2-12 grossly intact. Strength 5/5 in all 4.  [employer]  Data Reviewed: I have independently reviewed following labs and imaging studies   CBC: Recent Labs  Lab 06/05/20 1152 06/05/20 1158 06/05/20 1904 06/06/20 0508  WBC 3.7*  --  4.2 5.3  NEUTROABS 2.9  --  3.4  --   HGB 8.9* 7.8* 8.2* 7.8*  HCT 27.5* 23.0* 25.0*  24.2*  MCV 103.4*  --  100.4* 101.7*  PLT 79*  --  69* 61*   Basic Metabolic Panel: Recent Labs  Lab 06/05/20 1152 06/05/20 1158 06/06/20 0508  NA 138 137 134*  K 4.8 4.6 4.0  CL 104 103 106  CO2 22  --  20*  GLUCOSE 129* 124* 114*  BUN 34* 28* 29*  CREATININE 1.09 1.00 0.73  CALCIUM 9.3  --  8.7*   Liver Function Tests: Recent Labs  Lab 06/05/20 1152 06/06/20 0508  AST 59* 52*  ALT 26 24  ALKPHOS 104 76  BILITOT 3.8* 4.0*  PROT 5.8* 4.8*  ALBUMIN 2.9* 2.3*   Coagulation Profile: Recent Labs  Lab 06/05/20 1345 06/06/20 0508  INR 1.4* 1.7*   HbA1C: No results  for input(s): HGBA1C in the last 72 hours. CBG: Recent Labs  Lab 06/05/20 1136  GLUCAP 107*    Recent Results (from the past 240 hour(s))  Resp Panel by RT-PCR (Flu A&B, Covid) Nasopharyngeal Swab     Status: None   Collection Time: 06/05/20 11:52 AM   Specimen: Nasopharyngeal Swab; Nasopharyngeal(NP) swabs in vial transport medium  Result Value Ref Range Status   SARS Coronavirus 2 by RT PCR NEGATIVE NEGATIVE Final    Comment: (NOTE) SARS-CoV-2 target nucleic acids are NOT DETECTED.  The SARS-CoV-2 RNA is generally detectable in upper respiratory specimens during the acute phase of infection. The lowest concentration of SARS-CoV-2 viral copies this assay can detect is 138 copies/mL. A negative result does not preclude SARS-Cov-2 infection and should not be used as the sole basis for treatment or other patient management decisions. A negative result may occur with  improper specimen collection/handling, submission of specimen other than nasopharyngeal swab, presence of viral mutation(s) within the areas targeted by this assay, and inadequate number of viral copies(<138 copies/mL). A negative result must be combined with clinical observations, patient history, and epidemiological information. The expected result is Negative.  Fact Sheet for Patients:   EntrepreneurPulse.com.au  Fact Sheet for Healthcare Providers:  IncredibleEmployment.be  This test is no t yet approved or cleared by the [place] FDA and  has been authorized for detection and/or diagnosis of SARS-CoV-2 by FDA under an Emergency Use Authorization (EUA). This EUA will remain  in effect (meaning this test can be used) for the duration of the COVID-19 declaration under Section 564(b)(1) of the Act, 21 U.S.C.section 360bbb-3(b)(1), unless the authorization is terminated  or revoked sooner.       Influenza A by PCR NEGATIVE NEGATIVE Final   Influenza B by PCR NEGATIVE NEGATIVE Final    Comment: (NOTE) The Xpert Xpress SARS-CoV-2/FLU/RSV plus assay is intended as an aid in the diagnosis of influenza from Nasopharyngeal swab specimens and should not be used as a sole basis for treatment. Nasal washings and aspirates are unacceptable for Xpert Xpress SARS-CoV-2/FLU/RSV testing.  Fact Sheet for Patients: EntrepreneurPulse.com.au  Fact Sheet for Healthcare Providers: IncredibleEmployment.be  This test is not yet approved or cleared by the [place] FDA and has been authorized for detection and/or diagnosis of SARS-CoV-2 by FDA under an Emergency Use Authorization (EUA). This EUA will remain in effect (meaning this test can be used) for the duration of the COVID-19 declaration under Section 564(b)(1) of the Act, 21 U.S.C. section 360bbb-3(b)(1), unless the authorization is terminated or revoked.  Performed at [facility], [place] [address], [place], [place] [address]   Urine culture     Status: None   Collection Time: 06/05/20  1:45 PM   Specimen: In/Out Cath Urine  Result Value Ref Range Status   Specimen Description   Final    IN/OUT CATH URINE Performed at [place] [address] [place], [place] [address]    Special Requests   Final     NONE Performed at [facility], [place] [address], [place], [place] [address]    Culture   Final    NO GROWTH Performed at [facility], [place] [address] [name], [place] [address]    Report Status 06/06/2020 FINAL  Final  Blood Culture (routine x 2)     Status: None (Preliminary result)   Collection Time: 06/05/20  2:12 PM   Specimen: BLOOD RIGHT FOREARM  Result Value Ref  Range Status   Specimen Description   Final    BLOOD RIGHT FOREARM Performed at [place] [address], [place], [place] [address]    Special Requests   Final    BOTTLES DRAWN AEROBIC AND ANAEROBIC Blood Culture adequate volume Performed at [place] [address] [place], [place] [address]    Culture   Final    NO GROWTH 2 DAYS Performed at [place] [facility] [address], [place], [place] [address]    Report Status PENDING  Incomplete  Blood Culture (routine x 2)     Status: None (Preliminary result)   Collection Time: 06/05/20  2:12 PM   Specimen: BLOOD RIGHT FOREARM  Result Value Ref Range Status   Specimen Description   Final    BLOOD RIGHT FOREARM Performed at [place] [address] [place], [place] [address]    Special Requests   Final    BOTTLES DRAWN AEROBIC AND ANAEROBIC Blood Culture adequate volume Performed at [facility] [address], [place], [place] [address]    Culture   Final    NO GROWTH 2 DAYS Performed at [facility] [address] [place], [place] [address]    Report Status PENDING  Incomplete  C Difficile Quick Screen w PCR reflex     Status: Abnormal   Collection Time: 06/06/20  3:19 PM   Specimen: STOOL  Result Value Ref Range Status   C Diff antigen POSITIVE (A) NEGATIVE Final   C Diff toxin NEGATIVE NEGATIVE Final   C Diff interpretation Results are indeterminate. See PCR results.  Final    Comment: Performed at [facility],  [place] [address] [place], [place] [address]  C. Diff by PCR, Reflexed     Status: None   Collection Time: 06/06/20  3:19 PM  Result Value Ref Range Status   Toxigenic C. Difficile by PCR NEGATIVE NEGATIVE Final    Comment: Patient is colonized with non toxigenic C. difficile. May not need treatment unless significant symptoms are present. Performed at [facility], [place] [address] [place], [place] [address]      Radiology Studies: No results found.  Timothy Board, MD, PhD [facility]  Between 7 am - 7 pm I am available, please contact me via Amion or Securechat  Between 7 pm - 7 am I am not available, please contact night coverage MD/APP via Amion

## 2020-06-07 NOTE — Evaluation (Signed)
Occupational Therapy Evaluation Patient Details Name: Timothy Mckinney MRN: [id number] DOB: 08-Apr-1951 Today's Date: 06/07/2020    History of Present Illness 12 uo male admitted with AMS, acute hepatic encepalopathy, weakness, elevated ammonia levels, sepsis. Hx of DDD, sarcoidosis, ETOH abuse, liver cirrhosis, OA   Clinical Impression   Timothy Mckinney is a 69 year old man admitted with above medical history who presents with generalized weakness, decreased activity tolerance, impaired balance and altered mental status resulting in a decline in his functional abilities. Prior to admission patient independent with ADLs and home ambulation. Patient now requiring min assist for standing and transfers and mod-max assist with LB dressing and toileting. Patient limited by frequent watery stools. Patient will benefit from skilled OT services while in hospital to improve deficits and learn compensatory strategies as needed in order to return to PLOF. Patient's spouse wants patient to improve mobility and ADLs in order to take him home at discharge.     Follow Up Recommendations  Home health OT    Equipment Recommendations  None recommended by OT    Recommendations for Other Services       Precautions / Restrictions Precautions Precautions: Fall Precaution Comments: bowel incontinence(lactulose) Restrictions Weight Bearing Restrictions: No      Mobility Bed Mobility Overal bed mobility: Independent Bed Mobility: Supine to Sit     Supine to sit: Min guard;HOB elevated     General bed mobility comments: Min guard for safety. Increased time. Cues required    Transfers Overall transfer level: Needs assistance Equipment used: Rolling walker (2 wheeled) Transfers: Sit to/from Stand Sit to Stand: Min assist;From elevated surface         General transfer comment: Assist to rise, stabilize, control descent. Increased time. Cues for safety, technique, hand placement. Stand pivot, bed  to bsc, with RW.    Balance Overall balance assessment: Needs assistance;History of Falls Sitting-balance support: No upper extremity supported;Feet supported Sitting balance-Leahy Scale: Good     Standing balance support: Bilateral upper extremity supported Standing balance-Leahy Scale: Poor                             ADL either performed or assessed with clinical judgement   ADL Overall ADL's : Needs assistance/impaired Eating/Feeding: Set up;Sitting   Grooming: Sitting;Set up   Upper Body Bathing: Set up;Sitting   Lower Body Bathing: Moderate assistance;Sit to/from stand   Upper Body Dressing : Set up;Sitting   Lower Body Dressing: Moderate assistance;Sit to/from stand Lower Body Dressing Details (indicate cue type and reason): able to don socks with increased time and some difficulty from seated positioning. Needing assistance to pull clothing up due to hands needed on walker to stabilize Toilet Transfer: Minimal assistance;BSC;RW   Toileting- Clothing Manipulation and Hygiene: Maximal assistance;Sit to/from stand Toileting - Clothing Manipulation Details (indicate cue type and reason): assistance for clothing management and hygiene as patient holding onto walker             Vision Patient Visual Report: No change from baseline       Perception     Praxis      Pertinent Vitals/Pain Pain Assessment: No/denies pain Faces Pain Scale: No hurt     Hand Dominance     Extremity/Trunk Assessment Upper Extremity Assessment Upper Extremity Assessment: Generalized weakness   Lower Extremity Assessment Lower Extremity Assessment: Generalized weakness   Cervical / Trunk Assessment Cervical / Trunk Assessment: Normal   Communication  Communication Communication: No difficulties   Cognition Arousal/Alertness: Awake/alert Behavior During Therapy: WFL for tasks assessed/performed Overall Cognitive Status: Impaired/Different from baseline Area of  Impairment: Problem solving;Following commands                       Following Commands: Follows one step commands with increased time     Problem Solving: Requires verbal cues;Slow processing General Comments: Spouse reports improving cognition.   General Comments       Exercises     Shoulder Instructions      Home Living Family/patient expects to be discharged to:: Private residence Living Arrangements: Spouse/significant other Available Help at Discharge: Family Type of Home: House Home Access: Stairs to enter Technical brewer of Steps: 4 Entrance Stairs-Rails: Right Home Layout: Multi-level;Bed/bath upstairs Alternate Level Stairs-Number of Steps: 1 flight             Home Equipment: Walker - 2 wheels          Prior Functioning/Environment Level of Independence: Independent        Comments: Ind since discharging from rehab ~3 weeks PTA        OT Problem List: Decreased strength;Decreased activity tolerance;Impaired balance (sitting and/or standing);Decreased cognition;Decreased safety awareness;Decreased knowledge of use of DME or AE      OT Treatment/Interventions: Self-care/ADL training;Therapeutic exercise;DME and/or AE instruction;Therapeutic activities;Patient/family education;Balance training    OT Goals(Current goals can be found in the care plan section) Acute Rehab OT Goals Patient Stated Goal: per wife, for pt to mobilize as much as possible here so he can d/c home OT Goal Formulation: With patient Time For Goal Achievement: 06/21/20 Potential to Achieve Goals: Good  OT Frequency: Min 2X/week   Barriers to D/C:            Co-evaluation              AM-PAC OT "6 Clicks" Daily Activity     Outcome Measure Help from another person eating meals?: A Little Help from another person taking care of personal grooming?: A Little Help from another person toileting, which includes using toliet, bedpan, or urinal?: A Lot Help  from another person bathing (including washing, rinsing, drying)?: A Lot Help from another person to put on and taking off regular upper body clothing?: A Lot Help from another person to put on and taking off regular lower body clothing?: A Lot 6 Click Score: 14   End of Session Equipment Utilized During Treatment: Surveyor, mining Communication: Mobility status  Activity Tolerance: Patient tolerated treatment well Patient left: in chair;with call bell/phone within reach;with family/visitor present  OT Visit Diagnosis: Unsteadiness on feet (R26.81);Muscle weakness (generalized) (M62.81);History of falling (Z91.81)                Time: 8937-3428 OT Time Calculation (min): 26 min Charges:  OT General Charges $OT Visit: 1 Visit OT Evaluation $OT Eval Moderate Complexity: 1 Mod  Tahani Potier, OTR/L [place]  Office [phone] Pager: [place] 06/07/2020, 1:02 PM

## 2020-06-07 NOTE — Progress Notes (Signed)

## 2020-06-08 LAB — CBC
HCT: 23.5 % — ABNORMAL LOW (ref 39.0–52.0)
Hemoglobin: 7.4 g/dL — ABNORMAL LOW (ref 13.0–17.0)
MCH: 32.3 pg (ref 26.0–34.0)
MCHC: 31.5 g/dL (ref 30.0–36.0)
MCV: 102.6 fL — ABNORMAL HIGH (ref 80.0–100.0)
Platelets: 60 10*3/uL — ABNORMAL LOW (ref 150–400)
RBC: 2.29 MIL/uL — ABNORMAL LOW (ref 4.22–5.81)
RDW: 17.6 % — ABNORMAL HIGH (ref 11.5–15.5)
WBC: 4.6 10*3/uL (ref 4.0–10.5)
nRBC: 0 % (ref 0.0–0.2)

## 2020-06-08 LAB — COMPREHENSIVE METABOLIC PANEL
ALT: 24 U/L (ref 0–44)
AST: 52 U/L — ABNORMAL HIGH (ref 15–41)
Albumin: 2.1 g/dL — ABNORMAL LOW (ref 3.5–5.0)
Alkaline Phosphatase: 97 U/L (ref 38–126)
Anion gap: 3 — ABNORMAL LOW (ref 5–15)
BUN: 15 mg/dL (ref 8–23)
CO2: 25 mmol/L (ref 22–32)
Calcium: 8.3 mg/dL — ABNORMAL LOW (ref 8.9–10.3)
Chloride: 104 mmol/L (ref 98–111)
Creatinine, Ser: 0.92 mg/dL (ref 0.61–1.24)
GFR, Estimated: >60 mL/min (ref >60)
Glucose, Bld: 102 mg/dL — ABNORMAL HIGH (ref 70–99)
Potassium: 3.9 mmol/L (ref 3.5–5.1)
Sodium: 132 mmol/L — ABNORMAL LOW (ref 135–145)
Total Bilirubin: 2.3 mg/dL — ABNORMAL HIGH (ref 0.3–1.2)
Total Protein: 4.7 g/dL — ABNORMAL LOW (ref 6.5–8.1)

## 2020-06-08 LAB — PROTIME-INR
INR: 1.6 — ABNORMAL HIGH (ref 0.8–1.2)
Prothrombin Time: 18 seconds — ABNORMAL HIGH (ref 11.4–15.2)

## 2020-06-08 LAB — HEMOGLOBIN AND HEMATOCRIT, BLOOD
HCT: 27.1 % — ABNORMAL LOW (ref 39.0–52.0)
Hemoglobin: 8.5 g/dL — ABNORMAL LOW (ref 13.0–17.0)

## 2020-06-08 LAB — PREPARE RBC (CROSSMATCH)

## 2020-06-08 LAB — VITAMIN B1: Vitamin B1 (Thiamine): 201.4 nmol/L — ABNORMAL HIGH (ref 66.5–200.0)

## 2020-06-08 MED ORDER — SODIUM CHLORIDE 0.9% IV SOLUTION: Freq: Once | INTRAVENOUS | Status: AC

## 2020-06-08 NOTE — Progress Notes (Signed)
Occupational Therapy Treatment Patient Details Name: Timothy Mckinney MRN: [id number] DOB: [birth year] Today's Date: 06/08/2020    History of present illness 45 uo male admitted with AMS, acute hepatic encepalopathy, weakness, elevated ammonia levels, sepsis. Hx of DDD, sarcoidosis, ETOH abuse, liver cirrhosis, OA   OT comments  Patient min guard with RW to ambulate in room and stand at sink and min assist for toileting (cleaning up) after condom cath failure. Patient able to don and doff socks in seated position with set up. Patient demonstrating improved activity tolerance and balance - able to take hands off of walker to perform ADLs. Patient progressing towards goals.    Follow Up Recommendations  Home health OT    Equipment Recommendations  None recommended by OT    Recommendations for Other Services      Precautions / Restrictions Precautions Precautions: Fall Precaution Comments: bowel incontinence(lactulose) Restrictions Weight Bearing Restrictions: No       Mobility Bed Mobility Overal bed mobility: Needs Assistance Bed Mobility: Supine to Sit     Supine to sit: Min guard;HOB elevated     General bed mobility comments: Min guard for safety. Increased time. Cues required  Transfers Overall transfer level: Needs assistance Equipment used: Rolling walker (2 wheeled) Transfers: Sit to/from Stand Sit to Stand: Min guard;From elevated surface         General transfer comment: Min guard for standing and ambulation in room with RW.    Balance   Sitting-balance support: No upper extremity supported;Feet supported Sitting balance-Leahy Scale: Good     Standing balance support: During functional activity Standing balance-Leahy Scale: Fair Standing balance comment: able to raise hands off of walker for ADLs                           ADL either performed or assessed with clinical judgement   ADL       Grooming: Standing;Wash/dry hands;Min  guard Grooming Details (indicate cue type and reason): Patient ambulated to bathroom with RW to wash hands at sink. Able to lift hands off of walker for ADL task. Upper Body Bathing: Set up;Sitting Upper Body Bathing Details (indicate cue type and reason): Patient performed partial UB bathing at side of bed with set up and verbal cues on sequencing.         Lower Body Dressing: Set up;Min guard Lower Body Dressing Details (indicate cue type and reason): able to don and doff socks in seated position.     Toileting- Clothing Manipulation and Hygiene: Minimal assistance;Sit to/from stand;Min guard Toileting - Clothing Manipulation Details (indicate cue type and reason): Min assist to wash up after condom cath failure. Patient able to then stand and use urinal without UE support.             Vision Patient Visual Report: No change from baseline     Perception     Praxis      Cognition Arousal/Alertness: Awake/alert Behavior During Therapy: WFL for tasks assessed/performed Overall Cognitive Status: Impaired/Different from baseline Area of Impairment: Safety/judgement;Problem solving                       Following Commands: Follows one step commands consistently     Problem Solving: Requires verbal cues General Comments: Predominantly needing verbal cues to sequence task. Patietn performs one step and then asks "what's next"        Exercises     Shoulder Instructions  General Comments      Pertinent Vitals/ Pain       Pain Assessment: No/denies pain  Home Living                                          Prior Functioning/Environment              Frequency  Min 2X/week        Progress Toward Goals  OT Goals(current goals can now be found in the care plan section)  Progress towards OT goals: Progressing toward goals  Acute Rehab OT Goals Patient Stated Goal: per wife, for pt to mobilize as much as possible here so he  can d/c home OT Goal Formulation: With patient Time For Goal Achievement: 06/21/20 Potential to Achieve Goals: Good  Plan Discharge plan remains appropriate    Co-evaluation                 AM-PAC OT "6 Clicks" Daily Activity     Outcome Measure   Help from another person eating meals?: None Help from another person taking care of personal grooming?: A Little Help from another person toileting, which includes using toliet, bedpan, or urinal?: A Little Help from another person bathing (including washing, rinsing, drying)?: A Little Help from another person to put on and taking off regular upper body clothing?: A Little Help from another person to put on and taking off regular lower body clothing?: A Little 6 Click Score: 19    End of Session Equipment Utilized During Treatment: Rolling walker  OT Visit Diagnosis: Unsteadiness on feet (R26.81);Muscle weakness (generalized) (M62.81);History of falling (Z91.81)   Activity Tolerance Patient tolerated treatment well   Patient Left in chair;with call bell/phone within reach;with chair alarm set   Nurse Communication Mobility status (condom cath)        Time: 8295-6213 OT Time Calculation (min): 19 min  Charges: OT General Charges $OT Visit: 1 Visit OT Treatments $Self Care/Home Management : 8-22 mins  Derl Barrow, OTR/L [place]  Office [phone] Pager: [place] 06/08/2020, 4:08 PM

## 2020-06-08 NOTE — H&P (View-Only) (Signed)
Reason for Consult: GI bleed Referring Physician: [facility]  [name] HPI: This is a 69 year old male with a PMH of ETOH cirrhosis, recent finding of clean-based duodenal ulcers, GAVE, hepatic encephalopathy, and portal HTN gastropathy admitted for hepatic encephalopathy.  His wife brought him into the hospital with complaints of AMS.  Previously he was on lactulose and rifaximin, but rifaximin was too costly.  Lactulose produced diarrhea, but it was not always the case with the medication.  While in the hospital his mentation improved with restarting lactulose, but he was noted to have a slow drifting of his HGB downwards.  The patient does not have any issues with hematochezia or melena, but he was found to be heme positive.  On 04/21/2020 the EGD was positive for five superficially cratered duodenal ulcers, GAVE,  and portal HTN gastropathy.  There was no evidence of any esophageal or fundic varices.  At that time he presented with melena and an HGB of 6.9 g/dL.  Typically his baseline was around 10-11 g/dL.  Per the reports he maintained his PPI.  The gastric biopsies were negative for H. Pylori.  Past Medical History:  Diagnosis Date  . Abnormal chest x-ray    ? granulomata  . Arthritis    knees and back   . DDD (degenerative disc disease), lumbar    MRI 08/2005  . Dry eyes, bilateral    Dr Bing Plume  . Elevated liver enzymes    HISTORY OF ELEVATED LIVER ENZYMES  . GERD (gastroesophageal reflux disease)    hx of   . Hypertension    NEW DIAGNOSIS AND STARTED ON METOPROLOL ON 10/04/11  . Hyperuricemia   . 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 Nephrolithiasis    Dr Serita Butcher  . Sleep apnea    STOP BANG SCORE OF 4  . Thrombocytopenia ([place])    HX OF LOW PLATELET COUNT    Past Surgical History:  Procedure Laterality Date  . ANAL FISTULECTOMY  06/01/2012   Procedure: FISTULECTOMY ANAL;  Surgeon: Leighton Ruff, MD;  Location: [facility];  Service: General;  Laterality: N/A;   Mucosal Advancement Flap   . BIOPSY  04/21/2020   Procedure: BIOPSY;  Surgeon: Carol Ada, MD;  Location: [facility] END[facility]COPY;  Service: Endoscopy;;  . CARDIAC CATHETERIZATION     in 40s, negative  . colon ulcer  2011  . COLONOSCOPY W/ POLYPECTOMY  2011   Dr Sharlett Iles  . ESOPHAGOGASTRODUODENOSCOPY (EGD) WITH PROPOFOL N/A 04/21/2020   Procedure: ESOPHAGOGASTRODUODENOSCOPY (EGD) WITH PROPOFOL;  Surgeon: Carol Ada, MD;  Location: [facility];  Service: Endoscopy;  Laterality: N/A;  . KNEE ARTHROSCOPY  2009    L  . KNEE SURGERY     april 2013 total knee replacement removed  . LITHOTRIPSY    . tear duct plugs    . TOTAL KNEE ARTHROPLASTY  2011   Workman's Comp case, Dr Veverly Fells    Family History  Problem Relation Age of Onset  . Heart attack Father 78  . Colon polyps Mother   . Dementia Mother        died post SDH  . Cancer Paternal Grandfather        throat , pipe smoker  . Colon polyps Sister   . Cancer Sister        Colon, stage 2  . Hearing loss Brother   . Stroke Neg Hx     Social History:  reports that he has never smoked. He has never used smokeless tobacco.  He reports current alcohol use. He reports that he does not use drugs.  Allergies:  Allergies  Allergen Reactions  . Penicillins Swelling    Diffuse swelling    Medications:  Scheduled: . cycloSPORINE  1 drop Both Eyes Daily  . folic acid  1 mg Oral Daily  . furosemide  40 mg Oral Daily  . lactulose  20 g Per Tube TID  . pantoprazole  40 mg Oral BID  . rifaximin  550 mg Oral BID  . spironolactone  100 mg Oral Daily  . sucralfate  1 g Oral TID WC & HS  . thiamine  100 mg Oral Daily   Continuous: . cefTRIAXone (ROCEPHIN)  IV Stopped (06/07/20 2216)    Results for orders placed or performed during the hospital encounter of 06/05/20 (from the past 24 hour(s))  Comprehensive metabolic panel     Status: Abnormal   Collection Time: 06/08/20  4:28 AM  Result Value Ref Range   Sodium 132 (L) 135 - 145  mmol/L   Potassium 3.9 3.5 - 5.1 mmol/L   Chloride 104 98 - 111 mmol/L   CO2 25 22 - 32 mmol/L   Glucose, Bld 102 (H) 70 - 99 mg/dL   BUN 15 8 - 23 mg/dL   Creatinine, Ser 0.92 0.61 - 1.24 mg/dL   Calcium 8.3 (L) 8.9 - 10.3 mg/dL   Total Protein 4.7 (L) 6.5 - 8.1 g/dL   Albumin 2.1 (L) 3.5 - 5.0 g/dL   AST 52 (H) 15 - 41 U/L   ALT 24 0 - 44 U/L   Alkaline Phosphatase 97 38 - 126 U/L   Total Bilirubin 2.3 (H) 0.3 - 1.2 mg/dL   GFR, Estimated >60 >60 mL/min   Anion gap 3 (L) 5 - 15  CBC     Status: Abnormal   Collection Time: 06/08/20  4:28 AM  Result Value Ref Range   WBC 4.6 4.0 - 10.5 K/uL   RBC 2.29 (L) 4.22 - 5.81 MIL/uL   Hemoglobin 7.4 (L) 13.0 - 17.0 g/dL   HCT 23.5 (L) 39.0 - 52.0 %   MCV 102.6 (H) 80.0 - 100.0 fL   MCH 32.3 26.0 - 34.0 pg   MCHC 31.5 30.0 - 36.0 g/dL   RDW 17.6 (H) 11.5 - 15.5 %   Platelets 60 (L) 150 - 400 K/uL   nRBC 0.0 0.0 - 0.2 %  Protime-INR     Status: Abnormal   Collection Time: 06/08/20  4:28 AM  Result Value Ref Range   Prothrombin Time 18.0 (H) 11.4 - 15.2 seconds   INR 1.6 (H) 0.8 - 1.2  Type and screen [place]     Status: None (Preliminary result)   Collection Time: 06/08/20  9:38 AM  Result Value Ref Range   ABO/RH(D) O POS    Antibody Screen NEG    Sample Expiration 06/11/2020,2359    Unit Number [id number]    Blood Component Type RBC, LR IRR    Unit division 00    Status of Unit ISSUED    Transfusion Status OK TO TRANSFUSE    Crossmatch Result      Compatible Performed at [facility], [place] [address] [place], [place] [address]   Prepare RBC (crossmatch)     Status: None   Collection Time: 06/08/20  9:38 AM  Result Value Ref Range   Order Confirmation      ORDER PROCESSED BY BLOOD BANK Performed at [facility]  [place] [address], [place], [place] [address]      No results found.  ROS:  As stated above in the HPI otherwise negative.  Blood pressure (!)  129/57, pulse 77, temperature 98.6 F (37 C), temperature source Oral, resp. rate 18, height 5\' 11"  (1.803 m), weight 103.7 kg, SpO2 100 %.    PE: Gen: NAD, Alert and Oriented HEENT:  [place]/AT, EOMI Neck: Supple, no LAD Lungs: CTA Bilaterally CV: RRR without M/G/R ABD: Soft, NTND, +BS Ext: No C/C/E, no axterixis  Assessment/Plan: 1) Worsening anemia. 2) Cirrhosis. 3) Hepatic encephalopathy - resolved.   It is unclear why his HGB is dropping down.  A repeat EGD with the recent findings of the ulcerations and GAVE is warranted.  Currently he is stable and he is receiving another unit of PRBC.  Plan: 1) EGD tomorrow. 2) Monitor HGB and transfuse as necessary. 3) Continue with PPI. 4) Continue with lactulose and rifaximin.  Timothy Mckinney D 06/08/2020, 2:54 PM

## 2020-06-08 NOTE — Progress Notes (Signed)
PROGRESS NOTE  Timothy Mckinney [id number]:[id number] DOB: 1950/10/22 DOA: 06/05/2020 PCP: [name], MD   LOS: 3 days   Brief Narrative / Interim history: This is a 69 year old gentleman with EtOH induced liver cirrhosis, comes to the hospital with complaints of increased confusion.  Wife tells me that he has been generally "slow" recently, but few days ago she was unable to converse with him and he was extremely lethargic and he was brought to the hospital.  Apparently there is reports of patient not taking his lactulose as he should.  Based on prior notes, patient had diarrhea and held lactulose for a day.  In the ED he was hypothermic, tachycardic and had decreased WBC and there was concern for infectious process  Subjective / 24h Interval events: No significant complaints.  No shortness of breath, no chest pain, no abdominal pain, nausea or vomiting.  Eating well.  Denies any blood in the stools  Assessment & Plan: Principal Problem Hepatic encephalopathy in the setting of underlying liver cirrhosis -Cause for encephalopathy is not entirely clear to me, however skipping lactulose can definitely trigger it.  He was given a prescription of rifaximin symptoms in the past however wife tells me that it is very expensive and he is not taking that currently -Case discussed with Dr. [name] with gastroenterology over the phone, she will try see if they are able to work his rifaximin to be covered -Patient does not seem to have an active infectious process currently, he has no respiratory symptoms, no abdominal pain or dysuria -Seems to be improving and stable  Active Problems SIRS, unlikely sepsis -Treated with antibiotics with ceftriaxone, continue that for now while awaiting cultures.  There was concern for aspiration pneumonia but he has no respiratory symptoms.  There was concern for intra-abdominal source but he is completely asymptomatic.  Anemia, thrombocytopenia, history of GI  bleed -Possibly in the setting of underlying liver disease.  He was hospitalized recently with GI bleed and underwent EGD at the end of October 2021 which showed normal esophagus, gastric antral vascular ectasia without bleeding, portal hypertensive gastropathy as well as nonbleeding duodenal ulcers.  He was placed on PPI which he is to continue -Hemoglobin is low but no overt bleeding.  Trending down, will transfuse unit of packed red blood cells.  Fecal occult was positive probably related to portal hypertensive gastropathy.   Coagulopathy -Due to liver disease, INR 1.6, status post vitamin K 12/8  Paroxysmal A. fib -Observe during her hospital stay, due to coagulopathy was deemed not a candidate for anticoagulation  History of alcohol abuse -Sober for 4 months now, continue folic acid, thiamine.  There was concern for Warnicke's encephalopathy during his prior hospital stay and received high-dose thiamine  Severe protein calorie malnutrition -Encourage p.o. intake  History of sarcoidosis -Currently stable  Scheduled Meds: . cycloSPORINE  1 drop Both Eyes Daily  . folic acid  1 mg Oral Daily  . furosemide  40 mg Oral Daily  . lactulose  20 g Per Tube TID  . pantoprazole  40 mg Oral BID  . rifaximin  550 mg Oral BID  . spironolactone  100 mg Oral Daily  . sucralfate  1 g Oral TID WC & HS  . thiamine  100 mg Oral Daily   Continuous Infusions: . cefTRIAXone (ROCEPHIN)  IV Stopped (06/07/20 2216)   PRN Meds:.acetaminophen **OR** acetaminophen, fluticasone, ondansetron **OR** ondansetron (ZOFRAN) IV  Diet Orders (From admission, onward)    Start  Ordered   06/07/20 0909  DIET SOFT Room service appropriate? Yes; Fluid consistency: Thin  Diet effective now       Question Answer Comment  Room service appropriate? Yes   Fluid consistency: Thin      06/07/20 0908          DVT prophylaxis: SCDs Start: 06/06/20 0140 Place TED hose Start: 06/05/20 1520     Code Status:  Full Code  Family Communication: Wife at bedside  Status is: Inpatient  Remains inpatient appropriate because:Altered mental status   Dispo: The patient is from: Home              Anticipated d/c is to: Home              Anticipated d/c date is: 1 day              Patient currently is not medically stable to d/c.  Consultants:  None   Procedures:  None   Microbiology  None   Antimicrobials: Ceftriaxone     Objective: Vitals:   06/07/20 0555 06/07/20 1422 06/07/20 2048 06/08/20 0504  BP: (!) 121/51 138/70 (!) 125/49 (!) 109/48  Pulse: 75 68 76 75  Resp: 19 18 18 16   Temp: 98 F (36.7 C) 98.5 F (36.9 C) 98.3 F (36.8 C) 98.5 F (36.9 C)  TempSrc: Oral Oral Oral Oral  SpO2: 95% 98% 99% 98%  Weight:      Height:        Intake/Output Summary (Last 24 hours) at 06/08/2020 1032 Last data filed at 06/08/2020 0900 Gross per 24 hour  Intake 1180 ml  Output 1500 ml  Net -320 ml   Filed Weights   06/05/20 1103 06/06/20 0202  Weight: 98.4 kg 103.7 kg    Examination:  Constitutional: No distress, in bed Eyes: + scleral icterus ENMT: Moist mucous membranes Neck: normal, supple Respiratory: Lungs are clear bilaterally, no wheezing or crackles Cardiovascular: Regular rate and rhythm, no murmurs, trace edema Abdomen: Soft, NT, ND, bowel sounds positive Musculoskeletal: no clubbing / cyanosis.  Skin: No rashes Neurologic: Nonfocal, equal strength.  No asterixis  Data Reviewed: I have independently reviewed following labs and imaging studies   CBC: Recent Labs  Lab 06/05/20 1152 06/05/20 1158 06/05/20 1904 06/06/20 0508 06/08/20 0428  WBC 3.7*  --  4.2 5.3 4.6  NEUTROABS 2.9  --  3.4  --   --   HGB 8.9* 7.8* 8.2* 7.8* 7.4*  HCT 27.5* 23.0* 25.0* 24.2* 23.5*  MCV 103.4*  --  100.4* 101.7* 102.6*  PLT 79*  --  69* 61* 60*   Basic Metabolic Panel: Recent Labs  Lab 06/05/20 1152 06/05/20 1158 06/06/20 0508 06/08/20 0428  NA 138 137 134* 132*  K 4.8  4.6 4.0 3.9  CL 104 103 106 104  CO2 22  --  20* 25  GLUCOSE 129* 124* 114* 102*  BUN 34* 28* 29* 15  CREATININE 1.09 1.00 0.73 0.92  CALCIUM 9.3  --  8.7* 8.3*   Liver Function Tests: Recent Labs  Lab 06/05/20 1152 06/06/20 0508 06/08/20 0428  AST 59* 52* 52*  ALT 26 24 24   ALKPHOS 104 76 97  BILITOT 3.8* 4.0* 2.3*  PROT 5.8* 4.8* 4.7*  ALBUMIN 2.9* 2.3* 2.1*   Coagulation Profile: Recent Labs  Lab 06/05/20 1345 06/06/20 0508 06/08/20 0428  INR 1.4* 1.7* 1.6*   HbA1C: No results for input(s): HGBA1C in the last 72 hours. CBG: Recent Labs  Lab 06/05/20 1136  GLUCAP 107*    Recent Results (from the past 240 hour(s))  Resp Panel by RT-PCR (Flu A&B, Covid) Nasopharyngeal Swab     Status: None   Collection Time: 06/05/20 11:52 AM   Specimen: Nasopharyngeal Swab; Nasopharyngeal(NP) swabs in vial transport medium  Result Value Ref Range Status   SARS Coronavirus 2 by RT PCR NEGATIVE NEGATIVE Final    Comment: (NOTE) SARS-CoV-2 target nucleic acids are NOT DETECTED.  The SARS-CoV-2 RNA is generally detectable in upper respiratory specimens during the acute phase of infection. The lowest concentration of SARS-CoV-2 viral copies this assay can detect is 138 copies/mL. A negative result does not preclude SARS-Cov-2 infection and should not be used as the sole basis for treatment or other patient management decisions. A negative result may occur with  improper specimen collection/handling, submission of specimen other than nasopharyngeal swab, presence of viral mutation(s) within the areas targeted by this assay, and inadequate number of viral copies(<138 copies/mL). A negative result must be combined with clinical observations, patient history, and epidemiological information. The expected result is Negative.  Fact Sheet for Patients:  EntrepreneurPulse.com.au  Fact Sheet for Healthcare Providers:   IncredibleEmployment.be  This test is no t yet approved or cleared by the [place] FDA and  has been authorized for detection and/or diagnosis of SARS-CoV-2 by FDA under an Emergency Use Authorization (EUA). This EUA will remain  in effect (meaning this test can be used) for the duration of the COVID-19 declaration under Section 564(b)(1) of the Act, 21 U.S.C.section 360bbb-3(b)(1), unless the authorization is terminated  or revoked sooner.       Influenza A by PCR NEGATIVE NEGATIVE Final   Influenza B by PCR NEGATIVE NEGATIVE Final    Comment: (NOTE) The Xpert Xpress SARS-CoV-2/FLU/RSV plus assay is intended as an aid in the diagnosis of influenza from Nasopharyngeal swab specimens and should not be used as a sole basis for treatment. Nasal washings and aspirates are unacceptable for Xpert Xpress SARS-CoV-2/FLU/RSV testing.  Fact Sheet for Patients: EntrepreneurPulse.com.au  Fact Sheet for Healthcare Providers: IncredibleEmployment.be  This test is not yet approved or cleared by the [place] FDA and has been authorized for detection and/or diagnosis of SARS-CoV-2 by FDA under an Emergency Use Authorization (EUA). This EUA will remain in effect (meaning this test can be used) for the duration of the COVID-19 declaration under Section 564(b)(1) of the Act, 21 U.S.C. section 360bbb-3(b)(1), unless the authorization is terminated or revoked.  Performed at [facility], [place] [address], [place], [place] [address]   Urine culture     Status: None   Collection Time: 06/05/20  1:45 PM   Specimen: In/Out Cath Urine  Result Value Ref Range Status   Specimen Description   Final    IN/OUT CATH URINE Performed at [place] [address] [place], [place] [address]    Special Requests   Final    NONE Performed at [facility], [place] [address],  [place], [place] [address]    Culture   Final    NO GROWTH Performed at [facility], [place] [address] [place], [place] [address]    Report Status 06/06/2020 FINAL  Final  Blood Culture (routine x 2)     Status: None (Preliminary result)   Collection Time: 06/05/20  2:12 PM   Specimen: BLOOD RIGHT FOREARM  Result Value Ref Range Status   Specimen Description   Final  BLOOD RIGHT FOREARM Performed at [place] [address] [place], [place] [address]    Special Requests   Final    BOTTLES DRAWN AEROBIC AND ANAEROBIC Blood Culture adequate volume Performed at [place] [address] [place], [place] [address]    Culture   Final    NO GROWTH 3 DAYS Performed at [facility], [place] [address] [place], [place] [address]    Report Status PENDING  Incomplete  Blood Culture (routine x 2)     Status: None (Preliminary result)   Collection Time: 06/05/20  2:12 PM   Specimen: BLOOD RIGHT FOREARM  Result Value Ref Range Status   Specimen Description   Final    BLOOD RIGHT FOREARM Performed at [place] [address] [place], [place] [address]    Special Requests   Final    BOTTLES DRAWN AEROBIC AND ANAEROBIC Blood Culture adequate volume Performed at [place] [address] [place], [place] [address]    Culture   Final    NO GROWTH 3 DAYS Performed at [facility], [place] [address] [place], [place] [address]    Report Status PENDING  Incomplete  C Difficile Quick Screen w PCR reflex     Status: Abnormal   Collection Time: 06/06/20  3:19 PM   Specimen: STOOL  Result Value Ref Range Status   C Diff antigen POSITIVE (A) NEGATIVE Final   C Diff toxin NEGATIVE NEGATIVE Final   C Diff interpretation Results are indeterminate. See PCR results.  Final    Comment: Performed at [facility], [place] [address] [place], [place] [address]  C. Diff by PCR, Reflexed      Status: None   Collection Time: 06/06/20  3:19 PM  Result Value Ref Range Status   Toxigenic C. Difficile by PCR NEGATIVE NEGATIVE Final    Comment: Patient is colonized with non toxigenic C. difficile. May not need treatment unless significant symptoms are present. Performed at [facility], [place] [address] [place], [place] [address]      Radiology Studies: No results found.  Marzetta Board, MD, PhD [facility]  Between 7 am - 7 pm I am available, please contact me via Amion or Securechat  Between 7 pm - 7 am I am not available, please contact night coverage MD/APP via Amion

## 2020-06-08 NOTE — Consult Note (Signed)
Reason for Consult: GI bleed Referring Physician: [facility]  [name] HPI: This is a 69 year old male with a PMH of ETOH cirrhosis, recent finding of clean-based duodenal ulcers, GAVE, hepatic encephalopathy, and portal HTN gastropathy admitted for hepatic encephalopathy.  His wife brought him into the hospital with complaints of AMS.  Previously he was on lactulose and rifaximin, but rifaximin was too costly.  Lactulose produced diarrhea, but it was not always the case with the medication.  While in the hospital his mentation improved with restarting lactulose, but he was noted to have a slow drifting of his HGB downwards.  The patient does not have any issues with hematochezia or melena, but he was found to be heme positive.  On 04/21/2020 the EGD was positive for five superficially cratered duodenal ulcers, GAVE,  and portal HTN gastropathy.  There was no evidence of any esophageal or fundic varices.  At that time he presented with melena and an HGB of 6.9 g/dL.  Typically his baseline was around 10-11 g/dL.  Per the reports he maintained his PPI.  The gastric biopsies were negative for H. Pylori.  Past Medical History:  Diagnosis Date  . Abnormal chest x-ray    ? granulomata  . Arthritis    knees and back   . DDD (degenerative disc disease), lumbar    MRI 08/2005  . Dry eyes, bilateral    Dr Bing Plume  . Elevated liver enzymes    HISTORY OF ELEVATED LIVER ENZYMES  . GERD (gastroesophageal reflux disease)    hx of   . Hypertension    NEW DIAGNOSIS AND STARTED ON METOPROLOL ON 10/04/11  . Hyperuricemia   . 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 Nephrolithiasis    Dr Serita Butcher  . Sleep apnea    STOP BANG SCORE OF 4  . Thrombocytopenia ([place])    HX OF LOW PLATELET COUNT    Past Surgical History:  Procedure Laterality Date  . ANAL FISTULECTOMY  06/01/2012   Procedure: FISTULECTOMY ANAL;  Surgeon: Leighton Ruff, MD;  Location: [facility] ORS;  Service: General;  Laterality: N/A;   Mucosal Advancement Flap   . BIOPSY  04/21/2020   Procedure: BIOPSY;  Surgeon: Carol Ada, MD;  Location: [facility] ENDOSCOPY;  Service: Endoscopy;;  . CARDIAC CATHETERIZATION     in 40s, negative  . colon ulcer  2011  . COLONOSCOPY W/ POLYPECTOMY  2011   Dr Sharlett Iles  . ESOPHAGOGASTRODUODENOSCOPY (EGD) WITH PROPOFOL N/A 04/21/2020   Procedure: ESOPHAGOGASTRODUODENOSCOPY (EGD) WITH PROPOFOL;  Surgeon: Carol Ada, MD;  Location: [facility] ENDOSCOPY;  Service: Endoscopy;  Laterality: N/A;  . KNEE ARTHROSCOPY  2009    L  . KNEE SURGERY     april 2013 total knee replacement removed  . LITHOTRIPSY    . tear duct plugs    . TOTAL KNEE ARTHROPLASTY  2011   Workman's Comp case, Dr Veverly Fells    Family History  Problem Relation Age of Onset  . Heart attack Father 34  . Colon polyps Mother   . Dementia Mother        died post SDH  . Cancer Paternal Grandfather        throat , pipe smoker  . Colon polyps Sister   . Cancer Sister        Colon, stage 2  . Hearing loss Brother   . Stroke Neg Hx     Social History:  reports that he has never smoked. He has never used smokeless tobacco.  He reports current alcohol use. He reports that he does not use drugs.  Allergies:  Allergies  Allergen Reactions  . Penicillins Swelling    Diffuse swelling    Medications:  Scheduled: . cycloSPORINE  1 drop Both Eyes Daily  . folic acid  1 mg Oral Daily  . furosemide  40 mg Oral Daily  . lactulose  20 g Per Tube TID  . pantoprazole  40 mg Oral BID  . rifaximin  550 mg Oral BID  . spironolactone  100 mg Oral Daily  . sucralfate  1 g Oral TID WC & HS  . thiamine  100 mg Oral Daily   Continuous: . cefTRIAXone (ROCEPHIN)  IV Stopped (06/07/20 2216)    Results for orders placed or performed during the hospital encounter of 06/05/20 (from the past 24 hour(s))  Comprehensive metabolic panel     Status: Abnormal   Collection Time: 06/08/20  4:28 AM  Result Value Ref Range   Sodium 132 (L) 135 - 145  mmol/L   Potassium 3.9 3.5 - 5.1 mmol/L   Chloride 104 98 - 111 mmol/L   CO2 25 22 - 32 mmol/L   Glucose, Bld 102 (H) 70 - 99 mg/dL   BUN 15 8 - 23 mg/dL   Creatinine, Ser 0.92 0.61 - 1.24 mg/dL   Calcium 8.3 (L) 8.9 - 10.3 mg/dL   Total Protein 4.7 (L) 6.5 - 8.1 g/dL   Albumin 2.1 (L) 3.5 - 5.0 g/dL   AST 52 (H) 15 - 41 U/L   ALT 24 0 - 44 U/L   Alkaline Phosphatase 97 38 - 126 U/L   Total Bilirubin 2.3 (H) 0.3 - 1.2 mg/dL   GFR, Estimated >60 >60 mL/min   Anion gap 3 (L) 5 - 15  CBC     Status: Abnormal   Collection Time: 06/08/20  4:28 AM  Result Value Ref Range   WBC 4.6 4.0 - 10.5 K/uL   RBC 2.29 (L) 4.22 - 5.81 MIL/uL   Hemoglobin 7.4 (L) 13.0 - 17.0 g/dL   HCT 23.5 (L) 39.0 - 52.0 %   MCV 102.6 (H) 80.0 - 100.0 fL   MCH 32.3 26.0 - 34.0 pg   MCHC 31.5 30.0 - 36.0 g/dL   RDW 17.6 (H) 11.5 - 15.5 %   Platelets 60 (L) 150 - 400 K/uL   nRBC 0.0 0.0 - 0.2 %  Protime-INR     Status: Abnormal   Collection Time: 06/08/20  4:28 AM  Result Value Ref Range   Prothrombin Time 18.0 (H) 11.4 - 15.2 seconds   INR 1.6 (H) 0.8 - 1.2  Type and screen [place]     Status: None (Preliminary result)   Collection Time: 06/08/20  9:38 AM  Result Value Ref Range   ABO/RH(D) O POS    Antibody Screen NEG    Sample Expiration 06/11/2020,2359    Unit Number [id number]    Blood Component Type RBC, LR IRR    Unit division 00    Status of Unit ISSUED    Transfusion Status OK TO TRANSFUSE    Crossmatch Result      Compatible Performed at [facility], [place] [address] [place], [place] [address]   Prepare RBC (crossmatch)     Status: None   Collection Time: 06/08/20  9:38 AM  Result Value Ref Range   Order Confirmation      ORDER PROCESSED BY BLOOD BANK Performed at [facility]  [place] [address], [place], [place] [address]      No results found.  ROS:  As stated above in the HPI otherwise negative.  Blood pressure (!)  129/57, pulse 77, temperature 98.6 F (37 C), temperature source Oral, resp. rate 18, height 5\' 11"  (1.803 m), weight 103.7 kg, SpO2 100 %.    PE: Gen: NAD, Alert and Oriented HEENT:  [place]/AT, EOMI Neck: Supple, no LAD Lungs: CTA Bilaterally CV: RRR without M/G/R ABD: Soft, NTND, +BS Ext: No C/C/E, no axterixis  Assessment/Plan: 1) Worsening anemia. 2) Cirrhosis. 3) Hepatic encephalopathy - resolved.   It is unclear why his HGB is dropping down.  A repeat EGD with the recent findings of the ulcerations and GAVE is warranted.  Currently he is stable and he is receiving another unit of PRBC.  Plan: 1) EGD tomorrow. 2) Monitor HGB and transfuse as necessary. 3) Continue with PPI. 4) Continue with lactulose and rifaximin.  Shahad Mazurek D 06/08/2020, 2:54 PM

## 2020-06-09 ENCOUNTER — Encounter (HOSPITAL_COMMUNITY): Payer: Self-pay | Admitting: Internal Medicine

## 2020-06-09 ENCOUNTER — Inpatient Hospital Stay (HOSPITAL_COMMUNITY): Payer: PPO | Admitting: Certified Registered Nurse Anesthetist

## 2020-06-09 ENCOUNTER — Encounter (HOSPITAL_COMMUNITY)
Admission: EM | Disposition: A | Discharge status: Home Health | Payer: Self-pay | Source: Home / Self Care | Attending: Internal Medicine

## 2020-06-09 HISTORY — PX: HOT HEMOSTASIS: SHX5433

## 2020-06-09 HISTORY — PX: ESOPHAGOGASTRODUODENOSCOPY (EGD) WITH PROPOFOL: SHX5813

## 2020-06-09 LAB — CBC
HCT: 27.6 % — ABNORMAL LOW (ref 39.0–52.0)
Hemoglobin: 8.8 g/dL — ABNORMAL LOW (ref 13.0–17.0)
MCH: 31.8 pg (ref 26.0–34.0)
MCHC: 31.9 g/dL (ref 30.0–36.0)
MCV: 99.6 fL (ref 80.0–100.0)
Platelets: 65 10*3/uL — ABNORMAL LOW (ref 150–400)
RBC: 2.77 MIL/uL — ABNORMAL LOW (ref 4.22–5.81)
RDW: 18.6 % — ABNORMAL HIGH (ref 11.5–15.5)
WBC: 3.6 10*3/uL — ABNORMAL LOW (ref 4.0–10.5)
nRBC: 0 % (ref 0.0–0.2)

## 2020-06-09 LAB — COMPREHENSIVE METABOLIC PANEL
ALT: 24 U/L (ref 0–44)
AST: 47 U/L — ABNORMAL HIGH (ref 15–41)
Albumin: 2.3 g/dL — ABNORMAL LOW (ref 3.5–5.0)
Alkaline Phosphatase: 96 U/L (ref 38–126)
Anion gap: 8 (ref 5–15)
BUN: 15 mg/dL (ref 8–23)
CO2: 23 mmol/L (ref 22–32)
Calcium: 8.3 mg/dL — ABNORMAL LOW (ref 8.9–10.3)
Chloride: 102 mmol/L (ref 98–111)
Creatinine, Ser: 0.6 mg/dL — ABNORMAL LOW (ref 0.61–1.24)
GFR, Estimated: >60 mL/min (ref >60)
Glucose, Bld: 104 mg/dL — ABNORMAL HIGH (ref 70–99)
Potassium: 3.5 mmol/L (ref 3.5–5.1)
Sodium: 133 mmol/L — ABNORMAL LOW (ref 135–145)
Total Bilirubin: 3.1 mg/dL — ABNORMAL HIGH (ref 0.3–1.2)
Total Protein: 4.8 g/dL — ABNORMAL LOW (ref 6.5–8.1)

## 2020-06-09 LAB — TYPE AND SCREEN
ABO/RH(D): O POS
Antibody Screen: NEGATIVE
Unit division: 0

## 2020-06-09 LAB — BPAM RBC
Blood Product Expiration Date: 202201062359
ISSUE DATE / TIME: 202112091222
Unit Type and Rh: 5100

## 2020-06-09 LAB — AMMONIA: Ammonia: 20 umol/L (ref 9–35)

## 2020-06-09 SURGERY — ESOPHAGOGASTRODUODENOSCOPY (EGD) WITH PROPOFOL
Anesthesia: Monitor Anesthesia Care

## 2020-06-09 MED ORDER — PHENYLEPHRINE 40 MCG/ML (10ML) SYRINGE FOR IV PUSH (FOR BLOOD PRESSURE SUPPORT)
PREFILLED_SYRINGE | INTRAVENOUS | Status: DC | PRN
  Administered 2020-06-09 (×3): 120 ug via INTRAVENOUS

## 2020-06-09 MED ORDER — LIDOCAINE HCL (CARDIAC) PF 100 MG/5ML IV SOSY
PREFILLED_SYRINGE | INTRAVENOUS | Status: DC | PRN
  Administered 2020-06-09: 60 mg via INTRATRACHEAL

## 2020-06-09 MED ORDER — LACTATED RINGERS IV SOLN: INTRAVENOUS | Status: DC

## 2020-06-09 MED ORDER — MELATONIN 5 MG PO TABS
5.0000 mg | ORAL_TABLET | Freq: Every evening | ORAL | Status: DC | PRN
  Administered 2020-06-09: 5 mg via ORAL
  Filled 2020-06-09: qty 1

## 2020-06-09 MED ORDER — SODIUM CHLORIDE 0.9 % IV SOLN: INTRAVENOUS | Status: DC

## 2020-06-09 MED ORDER — PROPOFOL 500 MG/50ML IV EMUL
INTRAVENOUS | Status: DC | PRN
  Administered 2020-06-09 (×3): 40 mg via INTRAVENOUS

## 2020-06-09 MED ORDER — PROPOFOL 10 MG/ML IV BOLUS
INTRAVENOUS | Status: AC
  Filled 2020-06-09: qty 20

## 2020-06-09 MED ORDER — PROPOFOL 500 MG/50ML IV EMUL
INTRAVENOUS | Status: DC | PRN
  Administered 2020-06-09: 100 ug/kg/min via INTRAVENOUS

## 2020-06-09 SURGICAL SUPPLY — 15 items

## 2020-06-09 NOTE — Anesthesia Procedure Notes (Signed)
Procedure Name: MAC Date/Time: 06/09/2020 11:30 AM Performed by: West Pugh, CRNA Pre-anesthesia Checklist: Patient identified, Emergency Drugs available, Suction available, Patient being monitored and Timeout performed Patient Re-evaluated:Patient Re-evaluated prior to induction Oxygen Delivery Method: Simple face mask Preoxygenation: Pre-oxygenation with 100% oxygen Induction Type: IV induction Airway Equipment and Method: Bite block Placement Confirmation: positive ETCO2 Dental Injury: Teeth and Oropharynx as per pre-operative assessment  Comments: Bite block gently placed

## 2020-06-09 NOTE — Op Note (Signed)
[facility] Patient Name: Timothy Mckinney Procedure Date: 06/09/2020 MRN: [id number] Attending MD: Carol Ada , MD Date of Birth: 08-22-1950 CSN: [id number] Age: 69 Admit Type: Inpatient Procedure:                Upper GI endoscopy Indications:              Acute post hemorrhagic anemia Providers:                Carol Ada, MD, Benay Pillow, RN, Elspeth Cho                            Tech., Technician, Benetta Spar, Technician,                            Margurite Auerbach Referring MD:              Medicines:                Propofol per Anesthesia Complications:            No immediate complications. Estimated Blood Loss:     Estimated blood loss: none. Procedure:                Pre-Anesthesia Assessment:                           - Prior to the procedure, a History and Physical                            was performed, and patient medications and                            allergies were reviewed. The patient's tolerance of                            previous anesthesia was also reviewed. The risks                            and benefits of the procedure and the sedation                            options and risks were discussed with the patient.                            All questions were answered, and informed consent                            was obtained. Prior Anticoagulants: The patient has                            taken no previous anticoagulant or antiplatelet                            agents. ASA Grade Assessment: III - A patient with  severe systemic disease. After reviewing the risks                            and benefits, the patient was deemed in                            satisfactory condition to undergo the procedure.                           - Sedation was administered by an anesthesia                            professional. Deep sedation was attained.                           After obtaining informed consent,  the endoscope was                            passed under direct vision. Throughout the                            procedure, the patient's blood pressure, pulse, and                            oxygen saturations were monitored continuously. The                            GIF-H190 ([id number]) Olympus gastroscope was                            introduced through the mouth, and advanced to the                            second part of duodenum. The upper GI endoscopy was                            accomplished without difficulty. The patient                            tolerated the procedure well. Scope In: Scope Out: Findings:      The esophagus was normal.      Moderate gastric antral vascular ectasia without bleeding was present in       the gastric antrum. Coagulation for tissue destruction using monopolar       probe was successful. Estimated blood loss: none.      Mild portal hypertensive gastropathy was found in the gastric fundus and       in the gastric body.      One non-bleeding superficial duodenal ulcer with no stigmata of bleeding       was found in the duodenal bulb. The lesion was 3 mm in largest dimension.      There was no evidence of any active bleeding. The GAVE was again noted       and subjectively it may be the source of the recent anemia. The greatest  amount of erythema was noted in the pylorus. In the duodenal bulb one       healing ulcer was identified. there was evidence of re-epithelization.       APC was applied to the [place]. Impression:               - Normal esophagus.                           - Gastric antral vascular ectasia without bleeding.                            Treated with a monopolar probe.                           - Portal hypertensive gastropathy.                           - Non-bleeding duodenal ulcer with no stigmata of                            bleeding.                           - No specimens collected. Moderate Sedation:      Not  Applicable - Patient had care per Anesthesia. Recommendation:           - Return patient to hospital ward for ongoing care.                           - Resume regular diet.                           - Continue present medications.                           - Follow HGB and transfuse if necessary.                           - If the HGB is stable he can be discharged home                            tomorrow AM and follow up with Dr. [name].                           - If the anemia worsens, either inpatient or                            outpatient, a colonoscopy will need to be performed. Procedure Code(s):        --- Professional ---                           [phone], Esophagogastroduodenoscopy, flexible,                            transoral; with ablation of tumor(s), polyp(s), or  other lesion(s) (includes pre- and post-dilation                            and guide wire passage, when performed) Diagnosis Code(s):        --- Professional ---                           G28.366, Angiodysplasia of stomach and duodenum                            without bleeding                           K76.6, Portal hypertension                           K31.89, Other diseases of stomach and duodenum                           K26.9, Duodenal ulcer, unspecified as acute or                            chronic, without hemorrhage or perforation                           D62, Acute posthemorrhagic anemia CPT copyright 2019 American Medical Association. All rights reserved. The codes documented in this report are preliminary and upon coder review may  be revised to meet current compliance requirements. Carol Ada, MD Carol Ada, MD 06/09/2020 12:58:12 PM This report has been signed electronically. Number of Addenda: 0

## 2020-06-09 NOTE — Progress Notes (Signed)
Physical Therapy Treatment Patient Details Name: Timothy Mckinney MRN: [id number] DOB: May 25, 1951 Today's Date: 06/09/2020    History of Present Illness 15 uo male admitted with AMS, acute hepatic encepalopathy, weakness, elevated ammonia levels, sepsis. Hx of DDD, sarcoidosis, ETOH abuse, liver cirrhosis, OA    PT Comments    Pt reported "gas" in bed however had slight BM so utilized BSC prior to ambulating.  Pt required assist for pericare however able to assist with donning depends.  Pt tolerated improved distance of ambulation and left up in recliner (RN reports pt to go to endo soon).    Follow Up Recommendations  Home health PT;Supervision/Assistance - 24 hour     Equipment Recommendations  None recommended by PT    Recommendations for Other Services       Precautions / Restrictions Precautions Precautions: Fall Precaution Comments: bowel incontinence(lactulose)    Mobility  Bed Mobility Overal bed mobility: Needs Assistance Bed Mobility: Supine to Sit     Supine to sit: Supervision        Transfers Overall transfer level: Needs assistance Equipment used: None Transfers: Sit to/from Stand;Stand Pivot Transfers Sit to Stand: Min guard Stand pivot transfers: Min guard       General transfer comment: min/guard for safety, utilized BSC prior to ambulating, also donned depends  Ambulation/Gait Ambulation/Gait assistance: Min guard Gait Distance (Feet): 400 Feet Assistive device: Rolling walker (2 wheeled) Gait Pattern/deviations: Step-through pattern;Decreased stride length     General Gait Details: verbal cues for safe use of RW   Stairs             Wheelchair Mobility    Modified Rankin (Stroke Patients Only)       Balance           Standing balance support: During functional activity;No upper extremity supported Standing balance-Leahy Scale: Good                              Cognition Arousal/Alertness:  Awake/alert Behavior During Therapy: WFL for tasks assessed/performed Overall Cognitive Status: Impaired/Different from baseline Area of Impairment: Safety/judgement;Problem solving                       Following Commands: Follows one step commands consistently     Problem Solving: Requires verbal cues General Comments: Predominantly needing verbal cues to sequence task. Patient performs one step and then asks "what's next"      Exercises      General Comments        Pertinent Vitals/Pain Pain Assessment: No/denies pain    Home Living                      Prior Function            PT Goals (current goals can now be found in the care plan section) Progress towards PT goals: Progressing toward goals    Frequency    Min 3X/week      PT Plan Current plan remains appropriate    Co-evaluation              AM-PAC PT "6 Clicks" Mobility   Outcome Measure  Help needed turning from your back to your side while in a flat bed without using bedrails?: A Little Help needed moving from lying on your back to sitting on the side of a flat bed without using bedrails?: A Little Help needed  moving to and from a bed to a chair (including a wheelchair)?: A Little Help needed standing up from a chair using your arms (e.g., wheelchair or bedside chair)?: A Little Help needed to walk in hospital room?: A Little Help needed climbing 3-5 steps with a railing? : A Lot 6 Click Score: 17    End of Session Equipment Utilized During Treatment: Gait belt Activity Tolerance: Patient tolerated treatment well Patient left: in chair;with call bell/phone within reach;with family/visitor present Nurse Communication: Mobility status PT Visit Diagnosis: Muscle weakness (generalized) (M62.81);History of falling (Z91.81)     Time: 1281-1886 PT Time Calculation (min) (ACUTE ONLY): 31 min  Charges:  $Gait Training: 8-22 mins $Therapeutic Activity: 8-22 mins                     Jannette Spanner PT, DPT Acute Rehabilitation Services Pager: [phone] Office: [phone]  [name] E 06/09/2020, 1:11 PM

## 2020-06-09 NOTE — Progress Notes (Signed)
PROGRESS NOTE  Timothy Mckinney:[id number] DOB: 06/23/1951 DOA: 06/05/2020 PCP: [name], MD   LOS: 4 days   Brief Narrative / Interim history: This is a 69 year old gentleman with EtOH induced liver cirrhosis, comes to the hospital with complaints of increased confusion.  Wife tells me that he has been generally "slow" recently, but few days ago she was unable to converse with him and he was extremely lethargic and he was brought to the hospital.  Apparently there is reports of patient not taking his lactulose as he should.  Based on prior notes, patient had diarrhea and held lactulose for a day.  In the ED he was hypothermic, tachycardic and had decreased WBC and there was concern for infectious process  Subjective / 24h Interval events: Feeling better this morning.  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  He was given a prescription of rifaximin symptoms in the past however wife tells me that it is very expensive and he is not taking that currently.  Appreciate care management, it appears that there is a way for family to copy $109 which is significantly cheaper than being completely out-of-pocket -Case discussed with Dr. [name] with gastroenterology over the phone, she will try see if they are able to work his rifaximin to be covered as an outpatient also -Patient does not seem to have an active infectious process currently, he has no respiratory symptoms, no abdominal pain or dysuria -Seems to be improving and stable, mental status back to baseline  Active Problems SIRS, sepsis ruled out -Treated with antibiotics with ceftriaxone, continue that for now while awaiting cultures.  There was concern for aspiration pneumonia but he has no respiratory symptoms.  There  was concern for intra-abdominal source but he is completely asymptomatic.  Anemia, thrombocytopenia, history of GI bleed -Possibly in the setting of underlying liver disease.  He was hospitalized recently with GI bleed and underwent EGD at the end of October 2021 which showed normal esophagus, gastric antral vascular ectasia without bleeding, portal hypertensive gastropathy as well as nonbleeding duodenal ulcers.  He was placed on PPI which he is to continue -Hemoglobin is low but no overt bleeding.  Trending down, will transfuse unit of packed red blood cells.  Fecal occult was positive probably related to portal hypertensive gastropathy.  Gastroenterology consulted on 12/9 due to downtrend of hemoglobin, patient will undergo an EGD today.  Coagulopathy -Due to liver disease, status post vitamin K on 12/8  Paroxysmal A. fib -Observe during her hospital stay, due to coagulopathy was deemed not a candidate for anticoagulation  History of alcohol abuse -Sober for 4 months now, continue folic acid, thiamine.  There was concern for Warnicke's encephalopathy during his prior hospital stay and received high-dose thiamine  Severe protein calorie malnutrition -Encourage p.o. intake  History of sarcoidosis -Currently stable  Scheduled Meds: . cycloSPORINE  1 drop Both Eyes Daily  . folic acid  1 mg Oral Daily  . furosemide  40 mg Oral Daily  . lactulose  20 g Per Tube TID  . pantoprazole  40 mg Oral BID  . rifaximin  550 mg Oral BID  . spironolactone  100 mg Oral Daily  . sucralfate  1 g Oral TID WC & HS  . thiamine  100 mg Oral Daily  Continuous Infusions: . cefTRIAXone (ROCEPHIN)  IV 2 g (06/08/20 2149)   PRN Meds:.acetaminophen **OR** acetaminophen, fluticasone, ondansetron **OR** ondansetron (ZOFRAN) IV  Diet Orders (From admission, onward)    Start     Ordered   06/09/20 0001  Diet NPO time specified  Diet effective midnight        06/08/20 1824          DVT prophylaxis:  SCDs Start: 06/06/20 0140 Place TED hose Start: 06/05/20 1520     Code Status: Full Code  Family Communication: Wife at bedside  Status is: Inpatient  Remains inpatient appropriate because:Altered mental status  Dispo: The patient is from: Home              Anticipated d/c is to: Home              Anticipated d/c date is: 1 day              Patient currently is not medically stable to d/c.  Consultants:  None   Procedures:  None   Microbiology  None   Antimicrobials: Ceftriaxone     Objective: Vitals:   06/08/20 1340 06/08/20 1536 06/08/20 2048 06/09/20 0549  BP: (!) 129/57 (!) 121/54 (!) 121/56 (!) 122/51  Pulse: 77 77 76 76  Resp: 18 17  18   Temp: 98.6 F (37 C) 98.5 F (36.9 C) 98 F (36.7 C) 98.2 F (36.8 C)  TempSrc: Oral Oral Oral Oral  SpO2: 100% 99% 97% 96%  Weight:      Height:        Intake/Output Summary (Last 24 hours) at 06/09/2020 1118 Last data filed at 06/09/2020 0549 Gross per 24 hour  Intake 474 ml  Output 2525 ml  Net -2051 ml   Filed Weights   06/05/20 1103 06/06/20 0202  Weight: 98.4 kg 103.7 kg    Examination:  Constitutional: NAD, in bed Eyes: Minimal scleral icterus ENMT: mmm Neck: normal, supple Respiratory: Lungs are clear to auscultation, no wheezing, no crackles Cardiovascular: Regular rate and rhythm, no murmurs, trace edema Abdomen: Soft, NT, ND, bowel sounds positive Musculoskeletal: no clubbing / cyanosis.  Skin: No rashes seen Neurologic: Nonfocal, equal strength  Data Reviewed: I have independently reviewed following labs and imaging studies   CBC: Recent Labs  Lab 06/05/20 1152 06/05/20 1158 06/05/20 1904 06/06/20 0508 06/08/20 0428 06/08/20 2129 06/09/20 0449  WBC 3.7*  --  4.2 5.3 4.6  --  3.6*  NEUTROABS 2.9  --  3.4  --   --   --   --   HGB 8.9*   < > 8.2* 7.8* 7.4* 8.5* 8.8*  HCT 27.5*   < > 25.0* 24.2* 23.5* 27.1* 27.6*  MCV 103.4*  --  100.4* 101.7* 102.6*  --  99.6  PLT 79*  --  69* 61*  60*  --  65*   < > = values in this interval not displayed.   Basic Metabolic Panel: Recent Labs  Lab 06/05/20 1152 06/05/20 1158 06/06/20 0508 06/08/20 0428 06/09/20 0449  NA 138 137 134* 132* 133*  K 4.8 4.6 4.0 3.9 3.5  CL 104 103 106 104 102  CO2 22  --  20* 25 23  GLUCOSE 129* 124* 114* 102* 104*  BUN 34* 28* 29* 15 15  CREATININE 1.09 1.00 0.73 0.92 0.60*  CALCIUM 9.3  --  8.7* 8.3* 8.3*   Liver Function Tests: Recent Labs  Lab 06/05/20 1152 06/06/20 0508 06/08/20 0428 06/09/20 0449  AST 59* 52* 52* 47*  ALT 26 24 24 24   ALKPHOS 104 76 97 96  BILITOT 3.8* 4.0* 2.3* 3.1*  PROT 5.8* 4.8* 4.7* 4.8*  ALBUMIN 2.9* 2.3* 2.1* 2.3*   Coagulation Profile: Recent Labs  Lab 06/05/20 1345 06/06/20 0508 06/08/20 0428  INR 1.4* 1.7* 1.6*   HbA1C: No results for input(s): HGBA1C in the last 72 hours. CBG: Recent Labs  Lab 06/05/20 1136  GLUCAP 107*    Recent Results (from the past 240 hour(s))  Resp Panel by RT-PCR (Flu A&B, Covid) Nasopharyngeal Swab     Status: None   Collection Time: 06/05/20 11:52 AM   Specimen: Nasopharyngeal Swab; Nasopharyngeal(NP) swabs in vial transport medium  Result Value Ref Range Status   SARS Coronavirus 2 by RT PCR NEGATIVE NEGATIVE Final    Comment: (NOTE) SARS-CoV-2 target nucleic acids are NOT DETECTED.  The SARS-CoV-2 RNA is generally detectable in upper respiratory specimens during the acute phase of infection. The lowest concentration of SARS-CoV-2 viral copies this assay can detect is 138 copies/mL. A negative result does not preclude SARS-Cov-2 infection and should not be used as the sole basis for treatment or other patient management decisions. A negative result may occur with  improper specimen collection/handling, submission of specimen other than nasopharyngeal swab, presence of viral mutation(s) within the areas targeted by this assay, and inadequate number of viral copies(<138 copies/mL). A negative result must  be combined with clinical observations, patient history, and epidemiological information. The expected result is Negative.  Fact Sheet for Patients:  EntrepreneurPulse.com.au  Fact Sheet for Healthcare Providers:  IncredibleEmployment.be  This test is no t yet approved or cleared by the [place] FDA and  has been authorized for detection and/or diagnosis of SARS-CoV-2 by FDA under an Emergency Use Authorization (EUA). This EUA will remain  in effect (meaning this test can be used) for the duration of the COVID-19 declaration under Section 564(b)(1) of the Act, 21 U.S.C.section 360bbb-3(b)(1), unless the authorization is terminated  or revoked sooner.       Influenza A by PCR NEGATIVE NEGATIVE Final   Influenza B by PCR NEGATIVE NEGATIVE Final    Comment: (NOTE) The Xpert Xpress SARS-CoV-2/FLU/RSV plus assay is intended as an aid in the diagnosis of influenza from Nasopharyngeal swab specimens and should not be used as a sole basis for treatment. Nasal washings and aspirates are unacceptable for Xpert Xpress SARS-CoV-2/FLU/RSV testing.  Fact Sheet for Patients: EntrepreneurPulse.com.au  Fact Sheet for Healthcare Providers: IncredibleEmployment.be  This test is not yet approved or cleared by the [place] FDA and has been authorized for detection and/or diagnosis of SARS-CoV-2 by FDA under an Emergency Use Authorization (EUA). This EUA will remain in effect (meaning this test can be used) for the duration of the COVID-19 declaration under Section 564(b)(1) of the Act, 21 U.S.C. section 360bbb-3(b)(1), unless the authorization is terminated or revoked.  Performed at [facility], [place] [address] [place], [place] [address]   Urine culture     Status: None   Collection Time: 06/05/20  1:45 PM   Specimen: In/Out Cath Urine  Result Value Ref Range Status   Specimen Description    Final    IN/OUT CATH URINE Performed at [place] [address], [place], [place] [address]    Special Requests   Final    NONE Performed at [facility], [place] [address] [place], [place] [address]    Culture   Final  NO GROWTH Performed at [facility], [place] [address], [place], [place] [address]    Report Status 06/06/2020 FINAL  Final  Blood Culture (routine x 2)     Status: None (Preliminary result)   Collection Time: 06/05/20  2:12 PM   Specimen: BLOOD RIGHT FOREARM  Result Value Ref Range Status   Specimen Description   Final    BLOOD RIGHT FOREARM Performed at [place] [address], [place], [place] [address]    Special Requests   Final    BOTTLES DRAWN AEROBIC AND ANAEROBIC Blood Culture adequate volume Performed at [place] [address] [place], [place] [address]    Culture   Final    NO GROWTH 4 DAYS Performed at [facility], [place] [address] [place], [place] [address]    Report Status PENDING  Incomplete  Blood Culture (routine x 2)     Status: None (Preliminary result)   Collection Time: 06/05/20  2:12 PM   Specimen: BLOOD RIGHT FOREARM  Result Value Ref Range Status   Specimen Description   Final    BLOOD RIGHT FOREARM Performed at [place] [address] [place], [place] [address]    Special Requests   Final    BOTTLES DRAWN AEROBIC AND ANAEROBIC Blood Culture adequate volume Performed at [facility] [address] [place], [place] [address]    Culture   Final    NO GROWTH 4 DAYS Performed at [facility], [place] [address] [place], [place] [address]    Report Status PENDING  Incomplete  C Difficile Quick Screen w PCR reflex     Status: Abnormal   Collection Time: 06/06/20  3:19 PM   Specimen: STOOL  Result Value Ref Range Status   C Diff antigen POSITIVE (A) NEGATIVE Final   C Diff toxin  NEGATIVE NEGATIVE Final   C Diff interpretation Results are indeterminate. See PCR results.  Final    Comment: Performed at [facility], [place] [address] [place], [place] [address]  C. Diff by PCR, Reflexed     Status: None   Collection Time: 06/06/20  3:19 PM  Result Value Ref Range Status   Toxigenic C. Difficile by PCR NEGATIVE NEGATIVE Final    Comment: Patient is colonized with non toxigenic C. difficile. May not need treatment unless significant symptoms are present. Performed at [facility], [place] [address], [place],  [address]      Radiology Studies: No results found.  Marzetta Board, MD, PhD [facility]  Between 7 am - 7 pm I am available, please contact me via Amion or Securechat  Between 7 pm - 7 am I am not available, please contact night coverage MD/APP via Amion

## 2020-06-09 NOTE — Anesthesia Preprocedure Evaluation (Signed)
Anesthesia Evaluation  Patient identified by MRN, date of birth, ID band Patient awake    Reviewed: Allergy & Precautions, NPO status , Patient's Chart, lab work & pertinent test results, reviewed documented beta blocker date and time   Airway Mallampati: III  TM Distance: >3 FB Neck ROM: Full    Dental no notable dental hx. (+) Teeth Intact, Dental Advisory Given   Pulmonary sleep apnea ,    Pulmonary exam normal breath sounds clear to auscultation       Cardiovascular hypertension, Pt. on home beta blockers and Pt. on medications negative cardio ROS Normal cardiovascular exam Rhythm:Regular Rate:Normal  TTE 2016 - Mild LVH with LVEF 90-24%, grade 1 diastolic dysfunction. Mild left atrial enlargement. Trivial tricuspid regurgitation, unable to assess PASP.    Neuro/Psych PSYCHIATRIC DISORDERS Anxiety negative neurological ROS     GI/Hepatic PUD, GERD  ,(+) Cirrhosis     substance abuse  alcohol use,   Endo/Other  negative endocrine ROS  Renal/GU negative Renal ROS  negative genitourinary   Musculoskeletal  (+) Arthritis ,   Abdominal   Peds  Hematology  (+) Blood dyscrasia (Hgb 8.8, plt 65), anemia ,   Anesthesia Other Findings EGD for GIB  Presented on 12/6 with AMS for to have hepatic encephalopathy. On lactulose but not taking rifaximin. Ammonia 158. Now with heme positive stools.   Reproductive/Obstetrics                            Anesthesia Physical Anesthesia Plan  ASA: III  Anesthesia Plan: MAC   Post-op Pain Management:    Induction: Intravenous  PONV Risk Score and Plan: 2 and Propofol infusion and Treatment may vary due to age or medical condition  Airway Management Planned: Natural Airway  Additional Equipment:   Intra-op Plan:   Post-operative Plan:   Informed Consent: I have reviewed the patients History and Physical, chart, labs and discussed the  procedure including the risks, benefits and alternatives for the proposed anesthesia with the patient or authorized representative who has indicated his/her understanding and acceptance.     Dental advisory given  Plan Discussed with: CRNA  Anesthesia Plan Comments:         Anesthesia Quick Evaluation

## 2020-06-09 NOTE — Plan of Care (Signed)
  Problem: Education: Goal: Knowledge of General Education information will improve Description Including pain rating scale, medication(s)/side effects and non-pharmacologic comfort measures Outcome: Progressing   

## 2020-06-09 NOTE — Transfer of Care (Signed)
Immediate Anesthesia Transfer of Care Note  Patient: Timothy Mckinney  Procedure(s) Performed: ESOPHAGOGASTRODUODENOSCOPY (EGD) WITH PROPOFOL (N/A ) HOT HEMOSTASIS (ARGON PLASMA COAGULATION/BICAP) (N/A )  Patient Location: PACU and Endoscopy Unit  Anesthesia Type:MAC  Level of Consciousness: awake, alert  and oriented  Airway & Oxygen Therapy: Patient Spontanous Breathing and Patient connected to face mask  Post-op Assessment: Report given to RN and Post -op Vital signs reviewed and stable  Post vital signs: Reviewed and stable  Last Vitals:  Vitals Value Taken Time  BP 171/60 06/09/20 1207  Temp 36.8 C 06/09/20 1207  Pulse 47 06/09/20 1209  Resp 19 06/09/20 1209  SpO2 99 % 06/09/20 1209  Vitals shown include unvalidated device data.  Last Pain:  Vitals:   06/09/20 1207  TempSrc: Oral  PainSc:          Complications: No complications documented.

## 2020-06-09 NOTE — Interval H&P Note (Signed)
History and Physical Interval Note:  06/09/2020 11:22 AM  Timothy Mckinney  has presented today for surgery, with the diagnosis of GI bleed.  The various methods of treatment have been discussed with the patient and family. After consideration of risks, benefits and other options for treatment, the patient has consented to  Procedure(s): ESOPHAGOGASTRODUODENOSCOPY (EGD) WITH PROPOFOL (N/A) as a surgical intervention.  The patient's history has been reviewed, patient examined, no change in status, stable for surgery.  I have reviewed the patient's chart and labs.  Questions were answered to the patient's satisfaction.     Devina Bezold D

## 2020-06-09 NOTE — Plan of Care (Signed)

## 2020-06-09 NOTE — Anesthesia Postprocedure Evaluation (Signed)
Anesthesia Post Note  Patient: Timothy Mckinney  Procedure(s) Performed: ESOPHAGOGASTRODUODENOSCOPY (EGD) WITH PROPOFOL (N/A ) HOT HEMOSTASIS (ARGON PLASMA COAGULATION/BICAP) (N/A )     Patient location during evaluation: Endoscopy Anesthesia Type: MAC Level of consciousness: awake and alert Pain management: pain level controlled Vital Signs Assessment: post-procedure vital signs reviewed and stable Respiratory status: spontaneous breathing, nonlabored ventilation, respiratory function stable and patient connected to nasal cannula oxygen Cardiovascular status: blood pressure returned to baseline and stable Postop Assessment: no apparent nausea or vomiting Anesthetic complications: no   No complications documented.  Last Vitals:  Vitals:   06/09/20 1220 06/09/20 1246  BP: (!) 158/52 (!) 130/56  Pulse: 99 77  Resp: 16 16  Temp:  36.7 C  SpO2: 96% 100%    Last Pain:  Vitals:   06/09/20 1246  TempSrc: Oral  PainSc:                  Zaineb Nowaczyk L Fe Okubo

## 2020-06-10 DIAGNOSIS — K7030 Alcoholic cirrhosis of liver without ascites: Secondary | ICD-10-CM

## 2020-06-10 LAB — COMPREHENSIVE METABOLIC PANEL
ALT: 23 U/L (ref 0–44)
AST: 46 U/L — ABNORMAL HIGH (ref 15–41)
Albumin: 2.1 g/dL — ABNORMAL LOW (ref 3.5–5.0)
Alkaline Phosphatase: 88 U/L (ref 38–126)
Anion gap: 11 (ref 5–15)
BUN: 14 mg/dL (ref 8–23)
CO2: 22 mmol/L (ref 22–32)
Calcium: 8.2 mg/dL — ABNORMAL LOW (ref 8.9–10.3)
Chloride: 100 mmol/L (ref 98–111)
Creatinine, Ser: 0.81 mg/dL (ref 0.61–1.24)
GFR, Estimated: >60 mL/min (ref >60)
Glucose, Bld: 97 mg/dL (ref 70–99)
Potassium: 3.5 mmol/L (ref 3.5–5.1)
Sodium: 133 mmol/L — ABNORMAL LOW (ref 135–145)
Total Bilirubin: 2.3 mg/dL — ABNORMAL HIGH (ref 0.3–1.2)
Total Protein: 4.6 g/dL — ABNORMAL LOW (ref 6.5–8.1)

## 2020-06-10 LAB — CBC
HCT: 26.2 % — ABNORMAL LOW (ref 39.0–52.0)
Hemoglobin: 8.4 g/dL — ABNORMAL LOW (ref 13.0–17.0)
MCH: 31.6 pg (ref 26.0–34.0)
MCHC: 32.1 g/dL (ref 30.0–36.0)
MCV: 98.5 fL (ref 80.0–100.0)
Platelets: 75 10*3/uL — ABNORMAL LOW (ref 150–400)
RBC: 2.66 MIL/uL — ABNORMAL LOW (ref 4.22–5.81)
RDW: 18.2 % — ABNORMAL HIGH (ref 11.5–15.5)
WBC: 3.6 10*3/uL — ABNORMAL LOW (ref 4.0–10.5)
nRBC: 0 % (ref 0.0–0.2)

## 2020-06-10 LAB — CULTURE, BLOOD (ROUTINE X 2)
Culture: NO GROWTH
Culture: NO GROWTH
Special Requests: ADEQUATE
Special Requests: ADEQUATE

## 2020-06-10 MED ORDER — RIFAXIMIN 550 MG PO TABS: 550.0000 mg | ORAL_TABLET | Freq: Two times a day (BID) | ORAL | 1 refills | Status: AC

## 2020-06-10 NOTE — TOC Initial Note (Signed)
Transition of Care [facility]) - Initial/Assessment Note    Patient Details  Name: Timothy Mckinney MRN: [id number] Date of Birth: October 12, 1950  Transition of Care [facility] CM/SW Contact:    Joaquin Courts, RN Phone Number: 06/10/2020, 12:07 PM  Clinical Narrative:                 CM spoke with spouse who reports patient previously active with [facility] services and asks for resumption of HHPT/OT.  MD notified for orders and [facility] rep contacted to resume services.  Expected Discharge Plan: [place] Barriers to Discharge: No Barriers Identified   Patient Goals and CMS Choice Patient states their goals for this hospitalization and ongoing recovery are:: to go home with therapy CMS Medicare.gov Compare Post Acute Care list provided to:: Patient Represenative (must comment) Choice offered to / list presented to : Spouse  Expected Discharge Plan and Services Expected Discharge Plan: [place]   Discharge Planning Services: CM Consult Post Acute Care Choice: [place] arrangements for the past 2 months: Single Family Home Expected Discharge Date: 06/10/20               DME Arranged: N/A DME Agency: NA       HH Arranged: PT,OT [place] Agency: [facility] Date [place] Agency Contacted: 06/10/20 Time HH Agency Contacted: 1207    Prior Living Arrangements/Services Living arrangements for the past 2 months: [place] Lives with:: Spouse Patient language and need for interpreter reviewed:: Yes Do you feel safe going back to the place where you live?: Yes      Need for Family Participation in Patient Care: Yes (Comment) Care giver support system in place?: Yes (comment)   Criminal Activity/Legal Involvement Pertinent to Current Situation/Hospitalization: No - Comment as needed  Activities of Daily Living Home Assistive Devices/Equipment: Eyeglasses (reading glasses) ADL Screening (condition at time of admission) Patient's  cognitive ability adequate to safely complete daily activities?: No Is the patient deaf or have difficulty hearing?: No Does the patient have difficulty seeing, even when wearing glasses/contacts?: No Does the patient have difficulty concentrating, remembering, or making decisions?: Yes Patient able to express need for assistance with ADLs?: No Does the patient have difficulty dressing or bathing?: Yes Independently performs ADLs?: No Communication: Independent Dressing (OT): Needs assistance Is this a change from baseline?: Change from baseline, expected to last >3 days Grooming: Needs assistance Is this a change from baseline?: Change from baseline, expected to last >3 days Feeding: Needs assistance Is this a change from baseline?: Change from baseline, expected to last >3 days Bathing: Needs assistance Is this a change from baseline?: Change from baseline, expected to last >3 days Toileting: Needs assistance Is this a change from baseline?: Change from baseline, expected to last >3days In/Out Bed: Needs assistance Is this a change from baseline?: Change from baseline, expected to last >3 days Walks in Home: Dependent Is this a change from baseline?: Change from baseline, expected to last >3 days Does the patient have difficulty walking or climbing stairs?: Yes Weakness of Legs: Both Weakness of Arms/Hands: Both  Permission Sought/Granted                  Emotional Assessment Appearance:: Appears stated age Attitude/Demeanor/Rapport: Engaged Affect (typically observed): Accepting Orientation: : Oriented to Self,Oriented to Place,Oriented to  Time,Oriented to Situation   Psych Involvement: No (comment)  Admission diagnosis:  Hepatic encephalopathy ([place]) [K72.90] Sepsis ([place]) [  A41.9] Anemia, unspecified type [D64.9] AMS (altered mental status) [R41.82] Sepsis, due to unspecified organism, unspecified whether acute organ dysfunction present [facility]) [A41.9] Patient Active  Problem List   Diagnosis Date Noted  . AMS (altered mental status) 06/05/2020  . Sepsis ([place]) 06/05/2020  . A-fib ([place]) 05/22/2020  . Duodenal ulcer 05/22/2020  . Encephalopathy 05/22/2020  . Blood loss anemia 05/22/2020  . Thyroiditis, unspecified 05/22/2020  . Acute hepatic encephalopathy 04/19/2020  . Edema 01/20/2019  . Coagulopathy ([place]) 07/06/2018  . Kidney stones 07/06/2018  . Lower extremity edema 04/30/2018  . Folate deficiency 02/25/2018  . AKI (acute kidney injury) ([place]) 02/25/2018  . Chronic low back pain 02/11/2018  . Alcoholic liver disease ([place]) 07/29/2017  . Other pancytopenia ([place]) 07/16/2017  . PND (post-nasal drip) 06/17/2016  . Generalized anxiety disorder 06/13/2015  . Physical exam 12/09/2014  . Sarcoidosis ([place]) 11/09/2014  . Multiple pulmonary nodules 11/09/2014  . Hilar adenopathy 11/09/2014  . Knee pain   . Pulmonary nodules with adenopathy (since 2001 CT Chest) 10/15/2014  . Obesity (BMI 30-39.9) 08/05/2014  . Hypertension 08/05/2014  . 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 TRANSAMINASES, SERUM, ELEVATED 08/15/2009  . GERD (gastroesophageal reflux disease) 06/12/2009  . NEPHROLITHIASIS, HX OF 06/12/2009  . Hyperuricemia 06/12/2009  . CHEST XRAY, ABNORMAL 06/12/2009   PCP:  [name], MD Pharmacy:   [facility] [phone] [place], [place] [address] [place] [address] Phone: [phone] Fax: [phone]     Social Determinants of Health (SDOH) Interventions    Readmission Risk Interventions No flowsheet data found.

## 2020-06-10 NOTE — Progress Notes (Addendum)
Patient awoke from sleep and used call bell to call out to "speak with his knee doctor about his knee". Pt states no pain in knee and that he is in waiting room at [facility]. Easily reoriented and not argumentative/ hostile. Patient ambulates 1 assist with walker to bathroom and back to bed and is back to his baseline.

## 2020-06-10 NOTE — Progress Notes (Signed)
RN reviewed d/c paperwork with pt and wife. Tele removed. IV's x 2 removed. Paper script given to wife. All questions addressed including medications. No further needs at this time. Pt d/c in stable condition in private vehicle with wife.

## 2020-06-10 NOTE — Progress Notes (Signed)
Physical Therapy Treatment Patient Details Name: ABDULAH Mckinney MRN: [id number] DOB: 1951-06-18 Today's Date: 06/10/2020    History of Present Illness 45 uo male admitted with AMS, acute hepatic encepalopathy, weakness, elevated ammonia levels, sepsis. Hx of DDD, sarcoidosis, ETOH abuse, liver cirrhosis, OA    PT Comments    Pt scheduled to d/c home today.  Did well with RW, but needed cues for safe hand placement with standing to not pull up on it. Answered his questions about continued rehab at home and instructed in LE therex he could do as well.   Follow Up Recommendations  Home health PT;Supervision/Assistance - 24 hour     Equipment Recommendations  None recommended by PT    Recommendations for Other Services       Precautions / Restrictions Precautions Precautions: Fall Restrictions Weight Bearing Restrictions: No    Mobility  Bed Mobility   Bed Mobility: Supine to Sit     Supine to sit: Supervision     General bed mobility comments: S  Transfers Overall transfer level: Needs assistance Equipment used: Rolling walker (2 wheeled) Transfers: Sit to/from Stand Sit to Stand: Min guard         General transfer comment: cues for hand placement  Ambulation/Gait Ambulation/Gait assistance: Min guard Gait Distance (Feet): 425 Feet Assistive device: Rolling walker (2 wheeled) Gait Pattern/deviations: Step-through pattern;Decreased stride length;Trunk flexed     General Gait Details: cues for posture   Stairs             Wheelchair Mobility    Modified Rankin (Stroke Patients Only)       Balance           Standing balance support: Bilateral upper extremity supported Standing balance-Leahy Scale: Fair                              Cognition Arousal/Alertness: Awake/alert Behavior During Therapy: WFL for tasks assessed/performed Overall Cognitive Status: Within Functional Limits for tasks assessed                                  General Comments: WFL for tasks with less verbal cueing, but some slight tangential comments at times.      Exercises      General Comments General comments (skin integrity, edema, etc.): Instructed in LE therex he could do seated in chair.      Pertinent Vitals/Pain Pain Assessment: No/denies pain    Home Living                      Prior Function            PT Goals (current goals can now be found in the care plan section) Acute Rehab PT Goals Patient Stated Goal: per wife, for pt to mobilize as much as possible here so he can d/c home Progress towards PT goals: Progressing toward goals    Frequency    Min 3X/week      PT Plan Current plan remains appropriate    Co-evaluation              AM-PAC PT "6 Clicks" Mobility   Outcome Measure  Help needed turning from your back to your side while in a flat bed without using bedrails?: A Little Help needed moving from lying on your back to sitting on the side of a  flat bed without using bedrails?: A Little Help needed moving to and from a bed to a chair (including a wheelchair)?: A Little Help needed standing up from a chair using your arms (e.g., wheelchair or bedside chair)?: A Little Help needed to walk in hospital room?: A Little Help needed climbing 3-5 steps with a railing? : A Lot 6 Click Score: 17    End of Session Equipment Utilized During Treatment: Gait belt Activity Tolerance: Patient tolerated treatment well Patient left: in chair;with call bell/phone within reach;with chair alarm set Nurse Communication: Mobility status PT Visit Diagnosis: Muscle weakness (generalized) (M62.81);History of falling (Z91.81)     Time: 6770-3403 PT Time Calculation (min) (ACUTE ONLY): 24 min  Charges:  $Gait Training: 8-22 mins $Therapeutic Activity: 8-22 mins                     Timothy Mckinney L. Tamala Julian, [place] Pager [phone] 06/10/2020    Timothy Mckinney 06/10/2020, 12:23 PM

## 2020-06-10 NOTE — Discharge Summary (Signed)
Physician Discharge Summary  Timothy Mckinney WGY:[id number] DOB: Jan 12, 1951 DOA: 06/05/2020  PCP: [name], MD  Admit date: 06/05/2020 Discharge date: 06/10/2020  Admitted From: home Disposition:  home  Recommendations for Outpatient Follow-up:  1. Follow up with PCP in 1-2 weeks 2. Follow-up with gastroenterology in 1 to 2 weeks 3. Follow-up with cardiology as scheduled  Home Health: None Equipment/Devices: None  Discharge Condition: Stable CODE STATUS: Full code Diet recommendation: Low-sodium diet  HPI: Per admitting MD, Timothy Mckinney is a 69 y.o. male with medical history significant of cirrhosis. Presenting with AMS. Wife reports that he's presented to the ED recently with similar symptoms. He was sent home on lactulose, rifaximin. He was unable to afford the rifaximin, but took the lactulose daily. He was having significant diarrhea on Friday. He's wife spoke with a liver specialist who told her he could hold the lactulose for one day only. They did and resumed it on Saturday. He continued to have BMs, but not as loose. Apparently when she tried to wake him this morning, he would just stare into space. He would then repeat everything she said. She recognized this as similar to the confusion he had previously shoe she called for EMS. Of note, she reports during this time he was incontinent of bladder, but no tonic-clonic activity noted.     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  He was given a prescription of rifaximin symptoms in the past however it is very expensive and he is not taking that currently.  Appreciate care management, it appears that there is a way for family to copy $109 which is significantly cheaper than being completely out-of-pocket and rifaximin will be  prescribed at discharge.  Gastroenterology consulted and followed patient while hospitalized. Seems to be improving and stable, mental status back to baseline. Ammonia has normalized  Active Problems SIRS, sepsis ruled out -Treated with antibiotics with ceftriaxone, received 4 days while hospitalized, cultures were negative and will DC on discharge.  There was concern for aspiration pneumonia but he has no respiratory symptoms.  There was concern for intra-abdominal source but he is completely asymptomatic. Anemia, thrombocytopenia, history of GI bleed -Possibly in the setting of underlying liver disease.  He was hospitalized recently with GI bleed and underwent EGD at the end of October 2021 which showed normal esophagus, gastric antral vascular ectasia without bleeding, portal hypertensive gastropathy as well as nonbleeding duodenal ulcers.  He was placed on PPI which he is to continue. Hemoglobin is low but no overt bleeding.    Because he was trending down he was transfused unit of packed red blood cells, GI was consulted and he was taken for an EGD on 12/10 without any active bleeding, full report below.  Hemoglobin is remained stable, will continue to monitor-outpatient. Coagulopathy -Due to liver disease, status post vitamin K on 12/8 Paroxysmal A. Fib -Observe during his hospital stay, due to coagulopathy was deemed not a candidate for anticoagulation.  In sinus while here History of alcohol abuse -Sober for 4 months now.  During prior hospital stay there was concern for Wernicke's and received high-dose thiamine Severe protein calorie malnutrition -Encourage p.o. intake History of sarcoidosis -Currently stable  Discharge Instructions   Allergies as of 06/10/2020      Reactions   Penicillins Swelling   Diffuse swelling  Medication List    STOP taking these medications   oxyCODONE 5 MG immediate release tablet Commonly known as: Oxy IR/ROXICODONE     TAKE these medications    acetaminophen 500 MG tablet Commonly known as: TYLENOL Take 500 mg by mouth every 6 (six) hours as needed for moderate pain.   cycloSPORINE 0.05 % ophthalmic emulsion Commonly known as: RESTASIS Place 1 drop into both eyes daily.   fluticasone 50 MCG/ACT nasal spray Commonly known as: FLONASE Place 1 spray into both nostrils daily as needed for allergies or rhinitis.   folic acid 1 MG tablet Commonly known as: FOLVITE Take 1 tablet (1 mg total) by mouth daily.   furosemide 40 MG tablet Commonly known as: LASIX Take 1 tablet (40 mg total) by mouth daily.   lactulose 10 GM/15ML solution Commonly known as: CHRONULAC Take 15 mLs (10 g total) by mouth 2 (two) times daily. What changed: how much to take   levocetirizine 5 MG tablet Commonly known as: XYZAL Take 5 mg by mouth every evening.   metoprolol succinate 25 MG 24 hr tablet Commonly known as: TOPROL-XL TAKE 1 TABLET(25 MG) BY MOUTH DAILY What changed: See the new instructions.   pantoprazole 40 MG tablet Commonly known as: PROTONIX Take 1 tablet (40 mg total) by mouth 2 (two) times daily.   potassium chloride SA 20 MEQ tablet Commonly known as: KLOR-CON Take 1 tablet (20 mEq total) by mouth daily.   rifaximin 550 MG Tabs tablet Commonly known as: XIFAXAN Take 1 tablet (550 mg total) by mouth 2 (two) times daily.   spironolactone 50 MG tablet Commonly known as: ALDACTONE Take 1 tablet (50 mg total) by mouth daily. What changed: how much to take   sucralfate 1 g tablet Commonly known as: CARAFATE Take 1 tablet (1 g total) by mouth 4 (four) times daily -  with meals and at bedtime. What changed: when to take this   thiamine 100 MG tablet Take 1 tablet (100 mg total) by mouth daily.       Consultations:  GI  Procedures/Studies: EGD 12/10 Impression:               - Normal esophagus.                           - Gastric antral vascular ectasia without bleeding.                            Treated with  a monopolar probe.                           - Portal hypertensive gastropathy.                           - Non-bleeding duodenal ulcer with no stigmata of                            bleeding.                            - No specimens collected.   CT ABDOMEN PELVIS WO CONTRAST  Result Date: 06/05/2020 CLINICAL DATA:  Cirrhosis, altered mental status. History of hypertension, alcohol use, cirrhosis, GERD. EXAM: CT ABDOMEN AND PELVIS WITHOUT CONTRAST TECHNIQUE:  Multidetector CT imaging of the abdomen and pelvis was performed following the standard protocol without IV contrast. COMPARISON:  Ultrasound abdomen 04/20/2020, CT abdomen pelvis 10/15/2014, CT abdomen pelvis 06/17/2018 FINDINGS: Lower chest: Several right middle lobe pulmonary nodules that appears grossly stable in size (were only partially visualized on previous CT abdomen pelvis dating 06/17/2018): 6 and 7 mm (4:18), 5 and 6 mm (4:22), 7 mm (4:33). Similarly there is a stable pulmonary nodule within the right lower lobe measuring up to 5 mm (4:8). Interval development of a trace right pleural effusion and interval increase in size of a trace to small volume left pleural effusion. Coronary artery calcifications. No significant pericardial effusion. Hepatobiliary: Slightly nodular hepatic contour consistent with known cirrhosis. Layering hyperdensity within the gallbladder lumen consistent with gallstones. Pancreas: Diffusely atrophic. No focal lesion. Otherwise normal pancreatic contour. No surrounding inflammatory changes. No main pancreatic ductal dilatation. Spleen: Normal in size without focal abnormality. Adrenals/Urinary Tract: No adrenal nodule bilaterally. Several bilateral renal calcifications measuring up to 3 mm. No hydronephrosis, and no contour-deforming renal mass. No ureterolithiasis or hydroureter. The urinary bladder is unremarkable. Stomach/Bowel: Stomach is within normal limits. No evidence of small bowel wall thickening or  dilatation. Suggestion of asymmetric rectal wall thickening. Appendix appears normal. Vascular/Lymphatic: Perisplenic venous collateral noted. No abdominal aorta or iliac aneurysm. Moderate atherosclerotic plaque of the aorta and its branches. No abdominal, pelvic, or inguinal lymphadenopathy. Reproductive: No mass identified. Other: The mesentery appears slightly hazy diffusely. No intraperitoneal free fluid. No intraperitoneal free gas. No organized fluid collection. Musculoskeletal: No abdominal wall hernia or abnormality No suspicious lytic or blastic osseous lesions. Age-indeterminate, likely subacute to chronic, left posterior tenth rib fracture. New (from 2019) but chronic appearing chronic healed posterior left twelfth and eleventh rib fractures. New (from 2019) but chronic appearing healed left L1 transverse process fracture and right L2 transverse process fracture. No acute displaced fracture. Multilevel degenerative changes of the spine with intervertebral disc space vacuum phenomenon, endplate sclerosis, subchondral cystic changes, osteophyte formation, and facet arthropathy at the L4-L5 level. IMPRESSION: 1. Cirrhosis and portal hypertension. Unable to evaluate for hepatic lesions on this noncontrast study. Recommend MRI liver protocol for further evaluation. 2. Interval development of a trace volume right and interval increase of a trace to small volume left pleural effusion. 3. Age-indeterminate, likely subacute to chronic, left posterior tenth rib fracture. Recommend clinical correlation with physical exam for acute component. 4. Suggestion of asymmetric rectal wall thickening. Findings could be due to under distension or stool burden versus could represent a real finding. Recommend colonoscopy for further evaluation. 5. Other imaging findings of potential clinical significance: Nonobstructive bilateral nephrolithiasis measuring up to 3 mm. Cholelithiasis. Several stable right middle and right lower  lobe pulmonary nodules. Coronary artery calcifications. Aortic Atherosclerosis (ICD10-I70.0). Electronically Signed   By: Iven Finn M.D.   On: 06/05/2020 19:12   CT HEAD WO CONTRAST  Result Date: 06/05/2020 CLINICAL DATA:  Weakness, altered mental status. EXAM: CT HEAD WITHOUT CONTRAST TECHNIQUE: Contiguous axial images were obtained from the base of the skull through the vertex without intravenous contrast. COMPARISON:  04/19/2020 FINDINGS: Brain: No evidence of acute infarction, hemorrhage, hydrocephalus, extra-axial collection or mass lesion/mass effect. Scattered low-density changes within the periventricular and subcortical white matter compatible with chronic microvascular ischemic change. Mild diffuse cerebral volume loss. Vascular: Atherosclerotic calcifications involving the large vessels of the skull base. No unexpected hyperdense vessel. Skull: Normal. Negative for fracture or focal lesion. Sinuses/Orbits: No acute finding. Other: None. IMPRESSION:  1. No acute intracranial findings. 2. Chronic microvascular ischemic change and cerebral volume loss. Electronically Signed   By: Davina Poke D.O.   On: 06/05/2020 13:18   MR CERVICAL SPINE WO CONTRAST  Result Date: 05/29/2020 CLINICAL DATA:  Recent fall with neck pain EXAM: MRI CERVICAL SPINE WITHOUT CONTRAST TECHNIQUE: Multiplanar, multisequence MR imaging of the cervical spine was performed. No intravenous contrast was administered. COMPARISON:  None. FINDINGS: Alignment: Grade 1 retrolisthesis at C5-6 and C6-7. Vertebrae: No fracture, evidence of discitis, or bone lesion. Cord: Normal signal and morphology. Posterior Fossa, vertebral arteries, paraspinal tissues: Negative. Disc levels: C1-2: Unremarkable. C2-3: Normal disc space and facet joints. There is no spinal canal stenosis. No neural foraminal stenosis. C3-4: Mild facet hypertrophy. There is no spinal canal stenosis. No neural foraminal stenosis. C4-5: Moderate facet hypertrophy  small disc bulge. There is no spinal canal stenosis. No neural foraminal stenosis. C5-6: Disc space narrowing with uncovertebral spurring. Moderate spinal canal stenosis. Severe bilateral neural foraminal stenosis. C6-7: Small disc bulge with endplate spurring. Mild spinal canal stenosis. Moderate right and mild left neural foraminal stenosis. C7-T1: Normal disc space and facet joints. There is no spinal canal stenosis. No neural foraminal stenosis. IMPRESSION: 1. No acute abnormality of the cervical spine. 2. Moderate C5-6 spinal canal stenosis and severe bilateral neural foraminal stenosis. 3. Mild C6-7 spinal canal stenosis and moderate right, mild left neural foraminal stenosis. Electronically Signed   By: Ulyses Jarred M.D.   On: 05/29/2020 02:50   MR LUMBAR SPINE WO CONTRAST  Result Date: 05/29/2020 CLINICAL DATA:  Recent fall.  Back pain. EXAM: MRI LUMBAR SPINE WITHOUT CONTRAST TECHNIQUE: Multiplanar, multisequence MR imaging of the lumbar spine was performed. No intravenous contrast was administered. COMPARISON:  08/22/2005 FINDINGS: Segmentation:  Standard Alignment: Grade 1 retrolisthesis at L3-4 and grade 1 anterolisthesis at L4-5 Vertebrae:  No fracture, evidence of discitis, or bone lesion. Conus medullaris and cauda equina: Conus extends to the L1 level. Conus and cauda equina appear normal. Paraspinal and other soft tissues: Negative Disc levels: T12-L1 is normal. L1-L2: Normal disc space and facet joints. No spinal canal stenosis. No neural foraminal stenosis. L2-L3: Small disc bulge. No spinal canal stenosis. No neural foraminal stenosis. L3-L4: New, small disc bulge. No spinal canal stenosis. Mild bilateral neural foraminal stenosis. L4-L5: Right asymmetric disc bulge with moderate facet hypertrophy and endplate spurring, new from prior. Narrowing of both lateral recesses without central spinal canal stenosis. Severe right and moderate left neural foraminal stenosis. L5-S1: Left asymmetric  disc bulge with mild right and moderate left facet hypertrophy. Left lateral recess narrowing without central spinal canal stenosis. No right, but mild left neural foraminal stenosis. Visualized sacrum: Normal. IMPRESSION: 1. No acute abnormality of the lumbar spine. 2. L4-L5 severe right and moderate left neural foraminal stenosis, progressed from prior study. 3. L5-S1 left lateral recess narrowing with mild left neural foraminal stenosis. 4. Mild bilateral L3-4 neural foraminal stenosis. Electronically Signed   By: Ulyses Jarred M.D.   On: 05/29/2020 02:06   DG Chest Port 1 View  Result Date: 06/05/2020 CLINICAL DATA:  Altered mental status. EXAM: PORTABLE CHEST 1 VIEW COMPARISON:  10/15/2014 FINDINGS: The cardiomediastinal silhouette is unchanged. There is persistent elevation of the right hemidiaphragm. The left lung base was incompletely imaged, however there is new hazy airspace opacity in this region with an underlying pleural effusion also possible. The right lung is grossly clear. No pneumothorax is identified. No acute osseous abnormality is seen. IMPRESSION: Left basilar  opacity which could reflect atelectasis or pneumonia, potentially with a small pleural effusion. Consider further evaluation with PA and lateral chest radiographs when clinically able. Electronically Signed   By: Logan Bores M.D.   On: 06/05/2020 12:35   EEG adult  Result Date: 06/05/2020 Lora Havens, MD     06/05/2020  6:12 PM Patient Name: ZAI CHMIEL MRN: [id number] Epilepsy Attending: Lora Havens Referring Physician/Provider: Dr Cherylann Ratel Date: 06/05/2020 Duration: 23.01 mins Patient history: 69yo M with ams. EEG to evaluate for seizure Level of alertness: Awake AEDs during EEG study: None Technical aspects: This EEG study was done with scalp electrodes positioned according to the 10-20 International system of electrode placement. Electrical activity was acquired at a sampling rate of 500Hz  and reviewed with a  high frequency filter of 70Hz  and a low frequency filter of 1Hz . EEG data were recorded continuously and digitally stored. Description: No posterior dominant rhythm was seen. EEG showed continuous generalized 3 to 6 Hz theta-delta slowing. Generalized periodic discharges with triphasic morphology at 1Hz  were also noted.  Hyperventilation and photic stimulation were not performed.   ABNORMALITY -Continuous slow, generalized -Periodic discharges with triphasic morphology, generalized IMPRESSION: This study is suggestive of moderate diffuse encephalopathy, nonspecific etiology but could be related to toxic-metabolic causes like hyperammonemia. No seizures or definite epileptiform discharges were seen throughout the recording. Priyanka Barbra Sarks      Subjective: - no chest pain, shortness of breath, no abdominal pain, nausea or vomiting.   Discharge Exam: BP (!) 144/54 (BP Location: Left Arm)   Pulse 96   Temp 98.5 F (36.9 C) (Oral)   Resp 18   Ht 5\' 11"  (1.803 m)   Wt 95.3 kg   SpO2 99%   BMI 29.29 kg/m   General: Pt is alert, awake, not in acute distress Cardiovascular: RRR, S1/S2 +, no rubs, no gallops Respiratory: CTA bilaterally, no wheezing, no rhonchi Abdominal: Soft, NT, ND, bowel sounds + Extremities: no edema, no cyanosis   The results of significant diagnostics from this hospitalization (including imaging, microbiology, ancillary and laboratory) are listed below for reference.     Microbiology: Recent Results (from the past 240 hour(s))  Resp Panel by RT-PCR (Flu A&B, Covid) Nasopharyngeal Swab     Status: None   Collection Time: 06/05/20 11:52 AM   Specimen: Nasopharyngeal Swab; Nasopharyngeal(NP) swabs in vial transport medium  Result Value Ref Range Status   SARS Coronavirus 2 by RT PCR NEGATIVE NEGATIVE Final    Comment: (NOTE) SARS-CoV-2 target nucleic acids are NOT DETECTED.  The SARS-CoV-2 RNA is generally detectable in upper respiratory specimens during the  acute phase of infection. The lowest concentration of SARS-CoV-2 viral copies this assay can detect is 138 copies/mL. A negative result does not preclude SARS-Cov-2 infection and should not be used as the sole basis for treatment or other patient management decisions. A negative result may occur with  improper specimen collection/handling, submission of specimen other than nasopharyngeal swab, presence of viral mutation(s) within the areas targeted by this assay, and inadequate number of viral copies(<138 copies/mL). A negative result must be combined with clinical observations, patient history, and epidemiological information. The expected result is Negative.  Fact Sheet for Patients:  EntrepreneurPulse.com.au  Fact Sheet for Healthcare Providers:  IncredibleEmployment.be  This test is no t yet approved or cleared by the [place] FDA and  has been authorized for detection and/or diagnosis of SARS-CoV-2 by FDA under an Emergency Use Authorization (EUA). This EUA  will remain  in effect (meaning this test can be used) for the duration of the COVID-19 declaration under Section 564(b)(1) of the Act, 21 U.S.C.section 360bbb-3(b)(1), unless the authorization is terminated  or revoked sooner.       Influenza A by PCR NEGATIVE NEGATIVE Final   Influenza B by PCR NEGATIVE NEGATIVE Final    Comment: (NOTE) The Xpert Xpress SARS-CoV-2/FLU/RSV plus assay is intended as an aid in the diagnosis of influenza from Nasopharyngeal swab specimens and should not be used as a sole basis for treatment. Nasal washings and aspirates are unacceptable for Xpert Xpress SARS-CoV-2/FLU/RSV testing.  Fact Sheet for Patients: EntrepreneurPulse.com.au  Fact Sheet for Healthcare Providers: IncredibleEmployment.be  This test is not yet approved or cleared by the [place] FDA and has been authorized for detection and/or  diagnosis of SARS-CoV-2 by FDA under an Emergency Use Authorization (EUA). This EUA will remain in effect (meaning this test can be used) for the duration of the COVID-19 declaration under Section 564(b)(1) of the Act, 21 U.S.C. section 360bbb-3(b)(1), unless the authorization is terminated or revoked.  Performed at [facility], [place] [address], [place], [place] [address]   Urine culture     Status: None   Collection Time: 06/05/20  1:45 PM   Specimen: In/Out Cath Urine  Result Value Ref Range Status   Specimen Description   Final    IN/OUT CATH URINE Performed at [facility] [address] [place], [place] [address]    Special Requests   Final    NONE Performed at [facility], [place] [address], [place], [place] [address]    Culture   Final    NO GROWTH Performed at [facility], [place] [address] [place], [place] [address]    Report Status 06/06/2020 FINAL  Final  Blood Culture (routine x 2)     Status: None (Preliminary result)   Collection Time: 06/05/20  2:12 PM   Specimen: BLOOD RIGHT FOREARM  Result Value Ref Range Status   Specimen Description   Final    BLOOD RIGHT FOREARM Performed at [facility] [address], [place], [place] [address]    Special Requests   Final    BOTTLES DRAWN AEROBIC AND ANAEROBIC Blood Culture adequate volume Performed at [facility] [address], [place], [place] [address]    Culture   Final    NO GROWTH 4 DAYS Performed at [facility], [place] [address], [place], [place] [address]    Report Status PENDING  Incomplete  Blood Culture (routine x 2)     Status: None (Preliminary result)   Collection Time: 06/05/20  2:12 PM   Specimen: BLOOD RIGHT FOREARM  Result Value Ref Range Status   Specimen Description   Final    BLOOD RIGHT FOREARM Performed at [facility] [address], [place], [place] [address]     Special Requests   Final    BOTTLES DRAWN AEROBIC AND ANAEROBIC Blood Culture adequate volume Performed at [facility] [address], [place], [place] [address]    Culture   Final    NO GROWTH 4 DAYS Performed at [facility], [place] [address] [place], [place] [address]    Report Status PENDING  Incomplete  C Difficile Quick Screen w PCR reflex     Status: Abnormal   Collection Time: 06/06/20  3:19 PM   Specimen: STOOL  Result Value Ref Range Status   C Diff antigen POSITIVE (A) NEGATIVE Final   C Diff toxin NEGATIVE NEGATIVE Final   C Diff interpretation Results are indeterminate. See PCR results.  Final    Comment: Performed at [facility], [place] [address], [place], [place] [address]  C. Diff by PCR, Reflexed     Status: None   Collection Time: 06/06/20  3:19 PM  Result Value Ref Range Status   Toxigenic C. Difficile by PCR NEGATIVE NEGATIVE Final    Comment: Patient is colonized with non toxigenic C. difficile. May not need treatment unless significant symptoms are present. Performed at [facility], [place] [address] [place], [place] [address]      Labs: Basic Metabolic Panel: Recent Labs  Lab 06/05/20 1152 06/05/20 1158 06/06/20 0508 06/08/20 0428 06/09/20 0449 06/10/20 0540  NA 138 137 134* 132* 133* 133*  K 4.8 4.6 4.0 3.9 3.5 3.5  CL 104 103 106 104 102 100  CO2 22  --  20* 25 23 22   GLUCOSE 129* 124* 114* 102* 104* 97  BUN 34* 28* 29* 15 15 14   CREATININE 1.09 1.00 0.73 0.92 0.60* 0.81  CALCIUM 9.3  --  8.7* 8.3* 8.3* 8.2*   Liver Function Tests: Recent Labs  Lab 06/05/20 1152 06/06/20 0508 06/08/20 0428 06/09/20 0449 06/10/20 0540  AST 59* 52* 52* 47* 46*  ALT 26 24 24 24 23   ALKPHOS 104 76 97 96 88  BILITOT 3.8* 4.0* 2.3* 3.1* 2.3*  PROT 5.8* 4.8* 4.7* 4.8* 4.6*  ALBUMIN 2.9* 2.3* 2.1* 2.3* 2.1*   CBC: Recent Labs  Lab 06/05/20 1152 06/05/20 1158 06/05/20 1904 06/06/20 0508 06/08/20 0428  06/08/20 2129 06/09/20 0449 06/10/20 0540  WBC 3.7*  --  4.2 5.3 4.6  --  3.6* 3.6*  NEUTROABS 2.9  --  3.4  --   --   --   --   --   HGB 8.9*   < > 8.2* 7.8* 7.4* 8.5* 8.8* 8.4*  HCT 27.5*   < > 25.0* 24.2* 23.5* 27.1* 27.6* 26.2*  MCV 103.4*  --  100.4* 101.7* 102.6*  --  99.6 98.5  PLT 79*  --  69* 61* 60*  --  65* 75*   < > = values in this interval not displayed.   CBG: Recent Labs  Lab 06/05/20 1136  GLUCAP 107*   Hgb A1c No results for input(s): HGBA1C in the last 72 hours. Lipid Profile No results for input(s): CHOL, HDL, LDLCALC, TRIG, CHOLHDL, LDLDIRECT in the last 72 hours. Thyroid function studies No results for input(s): TSH, T4TOTAL, T3FREE, THYROIDAB in the last 72 hours.  Invalid input(s): FREET3 Urinalysis    Component Value Date/Time   COLORURINE YELLOW 06/05/2020 1315   APPEARANCEUR CLEAR 06/05/2020 1315   LABSPEC 1.018 06/05/2020 1315   PHURINE 6.0 06/05/2020 1315   GLUCOSEU NEGATIVE 06/05/2020 1315   HGBUR NEGATIVE 06/05/2020 1315   BILIRUBINUR NEGATIVE 06/05/2020 1315   KETONESUR 5 (A) 06/05/2020 1315   PROTEINUR NEGATIVE 06/05/2020 1315   UROBILINOGEN 0.2 10/15/2014 1020   NITRITE NEGATIVE 06/05/2020 1315   LEUKOCYTESUR NEGATIVE 06/05/2020 1315    FURTHER DISCHARGE INSTRUCTIONS:   Get Medicines reviewed and adjusted: Please take all your medications with you for your next visit with your Primary MD   Laboratory/radiological data: Please request your Primary MD to go over all hospital tests and procedure/radiological results at the follow up, please ask your Primary MD to  get all Hospital records sent to his/her office.   In some cases, they will be blood work, cultures and biopsy results pending at the time of your discharge. Please request that your primary care M.D. goes through all the records of your hospital data and follows up on these results.   Also Note the following: If you experience worsening of your admission symptoms, develop  shortness of breath, life threatening emergency, suicidal or homicidal thoughts you must seek medical attention immediately by calling 911 or calling your MD immediately  if symptoms less severe.   You must read complete instructions/literature along with all the possible adverse reactions/side effects for all the Medicines you take and that have been prescribed to you. Take any new Medicines after you have completely understood and accpet all the possible adverse reactions/side effects.    Do not drive when taking Pain medications or sleeping medications (Benzodaizepines)   Do not take more than prescribed Pain, Sleep and Anxiety Medications. It is not advisable to combine anxiety,sleep and pain medications without talking with your primary care practitioner   Special Instructions: If you have smoked or chewed Tobacco  in the last 2 yrs please stop smoking, stop any regular Alcohol  and or any Recreational drug use.   Wear Seat belts while driving.   Please note: You were cared for by a hospitalist during your hospital stay. Once you are discharged, your primary care physician will handle any further medical issues. Please note that NO REFILLS for any discharge medications will be authorized once you are discharged, as it is imperative that you return to your primary care physician (or establish a relationship with a primary care physician if you do not have one) for your post hospital discharge needs so that they can reassess your need for medications and monitor your lab values.  Time coordinating discharge: 35 minutes  SIGNED:  Marzetta Board, MD, PhD 06/10/2020, 9:36 AM

## 2020-06-11 DIAGNOSIS — I480 Paroxysmal atrial fibrillation: Secondary | ICD-10-CM | POA: Diagnosis not present

## 2020-06-11 DIAGNOSIS — E538 Deficiency of other specified B group vitamins: Secondary | ICD-10-CM | POA: Diagnosis not present

## 2020-06-11 DIAGNOSIS — K7200 Acute and subacute hepatic failure without coma: Secondary | ICD-10-CM | POA: Diagnosis not present

## 2020-06-11 DIAGNOSIS — H04123 Dry eye syndrome of bilateral lacrimal glands: Secondary | ICD-10-CM | POA: Diagnosis not present

## 2020-06-11 DIAGNOSIS — Z7901 Long term (current) use of anticoagulants: Secondary | ICD-10-CM | POA: Diagnosis not present

## 2020-06-11 DIAGNOSIS — R41841 Cognitive communication deficit: Secondary | ICD-10-CM | POA: Diagnosis not present

## 2020-06-11 DIAGNOSIS — F411 Generalized anxiety disorder: Secondary | ICD-10-CM | POA: Diagnosis not present

## 2020-06-11 DIAGNOSIS — I10 Essential (primary) hypertension: Secondary | ICD-10-CM | POA: Diagnosis not present

## 2020-06-11 DIAGNOSIS — G8929 Other chronic pain: Secondary | ICD-10-CM | POA: Diagnosis not present

## 2020-06-11 DIAGNOSIS — D649 Anemia, unspecified: Secondary | ICD-10-CM | POA: Diagnosis not present

## 2020-06-11 DIAGNOSIS — D696 Thrombocytopenia, unspecified: Secondary | ICD-10-CM | POA: Diagnosis not present

## 2020-06-11 DIAGNOSIS — E43 Unspecified severe protein-calorie malnutrition: Secondary | ICD-10-CM | POA: Diagnosis not present

## 2020-06-11 DIAGNOSIS — M1990 Unspecified osteoarthritis, unspecified site: Secondary | ICD-10-CM | POA: Diagnosis not present

## 2020-06-11 DIAGNOSIS — E785 Hyperlipidemia, unspecified: Secondary | ICD-10-CM | POA: Diagnosis not present

## 2020-06-11 DIAGNOSIS — D869 Sarcoidosis, unspecified: Secondary | ICD-10-CM | POA: Diagnosis not present

## 2020-06-11 DIAGNOSIS — K7030 Alcoholic cirrhosis of liver without ascites: Secondary | ICD-10-CM | POA: Diagnosis not present

## 2020-06-11 DIAGNOSIS — Z9181 History of falling: Secondary | ICD-10-CM | POA: Diagnosis not present

## 2020-06-11 DIAGNOSIS — K219 Gastro-esophageal reflux disease without esophagitis: Secondary | ICD-10-CM | POA: Diagnosis not present

## 2020-06-11 DIAGNOSIS — D61818 Other pancytopenia: Secondary | ICD-10-CM | POA: Diagnosis not present

## 2020-06-11 DIAGNOSIS — Z791 Long term (current) use of non-steroidal anti-inflammatories (NSAID): Secondary | ICD-10-CM | POA: Diagnosis not present

## 2020-06-12 ENCOUNTER — Other Ambulatory Visit: Payer: Self-pay | Admitting: Nurse Practitioner

## 2020-06-12 ENCOUNTER — Telehealth: Payer: Self-pay

## 2020-06-12 DIAGNOSIS — K7030 Alcoholic cirrhosis of liver without ascites: Secondary | ICD-10-CM

## 2020-06-12 NOTE — Telephone Encounter (Signed)
Pt's wife called back stating to disregard this they have gotten it taken care of.

## 2020-06-12 NOTE — Telephone Encounter (Signed)
Patient's wife is calling in asking if Dr.Tabori is able to send a prescription for rifaximin (XIFAXAN) 550 MG TABS tablet, states that it is really expensive, it was originally prescribed when he was in rehab but insurance is requiring that forms be filled out that state it is a medical necessity and wondered if Dr.Tabori would be able to help with this. Wife asked to have a call back and not to call Mikki Santee as he is still "confused".

## 2020-06-12 NOTE — Telephone Encounter (Signed)
Noted  

## 2020-06-13 ENCOUNTER — Telehealth: Payer: Self-pay

## 2020-06-13 NOTE — Telephone Encounter (Signed)
Transition Care Management Follow-up Telephone Call  Date of discharge and from where: 06/10/2020-[place]  How have you been since you were released from the hospital? Ok-per wife  Any questions or concerns? No  Items Reviewed:  Did the pt receive and understand the discharge instructions provided? Yes   Medications obtained and verified? Yes   Other? Yes   Any new allergies since your discharge? No   Dietary orders reviewed? Yes  Do you have support at home? Yes   Home Care and Equipment/Supplies: Were home health services ordered? No If so, what is the name of the agency? N/A  Has the agency set up a time to come to the patient's home? not applicable Were any new equipment or medical supplies ordered?  No What is the name of the medical supply agency? n/a Were you able to get the supplies/equipment? not applicable Do you have any questions related to the use of the equipment or supplies? No  Functional Questionnaire: (I = Independent and D = Dependent) ADLs: I with assistance  Bathing/Dressing- I with assistance   Meal Prep- D  Eating- I  Maintaining continence- I  Transferring/Ambulation- I with assistance   Managing Meds- I with assistance  Follow up appointments reviewed:   PCP Hospital f/u appt confirmed? Yes  Scheduled to see Dr. Birdie Riddle on 06/21/20 @ 11:00.  [facility] f/u appt confirmed? Yes  Scheduled to see Cardiology on 06/14/20 @ 9:20.  Are transportation arrangements needed? No   If their condition worsens, is the pt aware to call PCP or go to the Emergency Dept.? Yes  Was the patient provided with contact information for the PCP's office or ED? Yes  Was to pt encouraged to call back with questions or concerns? Yes

## 2020-06-14 ENCOUNTER — Other Ambulatory Visit: Payer: Self-pay

## 2020-06-14 ENCOUNTER — Encounter: Payer: Self-pay | Admitting: Cardiology

## 2020-06-14 ENCOUNTER — Ambulatory Visit: Payer: PPO | Admitting: Cardiology

## 2020-06-14 VITALS — BP 100/40 | HR 75 | Ht 71.0 in | Wt 223.0 lb

## 2020-06-14 DIAGNOSIS — K7030 Alcoholic cirrhosis of liver without ascites: Secondary | ICD-10-CM | POA: Diagnosis not present

## 2020-06-14 DIAGNOSIS — I480 Paroxysmal atrial fibrillation: Secondary | ICD-10-CM | POA: Diagnosis not present

## 2020-06-14 NOTE — Patient Instructions (Signed)
Medication Instructions:  °The current medical regimen is effective;  continue present plan and medications. ° °*If you need a refill on your cardiac medications before your next appointment, please call your pharmacy* ° °Testing/Procedures: °Your physician has requested that you have an echocardiogram. Echocardiography is a painless test that uses sound waves to create images of your heart. It provides your doctor with information about the size and shape of your heart and how well your heart’s chambers and valves are working. This procedure takes approximately one hour. There are no restrictions for this procedure. ° °Follow-Up: °At [facility], you and your health needs are our priority.  As part of our continuing mission to provide you with exceptional heart care, we have created designated Provider Care Teams.  These Care Teams include your primary Cardiologist (physician) and Advanced Practice Providers (APPs -  Physician Assistants and Nurse Practitioners) who all work together to provide you with the care you need, when you need it. ° °We recommend signing up for the patient portal called "MyChart".  Sign up information is provided on this After Visit Summary.  MyChart is used to connect with patients for Virtual Visits (Telemedicine).  Patients are able to view lab/test results, encounter notes, upcoming appointments, etc.  Non-urgent messages can be sent to your provider as well.   °To learn more about what you can do with MyChart, go to [email or web address].   ° °Your next appointment:   °6 month(s) ° °The format for your next appointment:   °In Person ° °Provider:   °Mark Skains, MD { ° ° ° °Thank you for choosing [facility]!! ° ° ° °

## 2020-06-14 NOTE — Progress Notes (Signed)
Cardiology Office Note:    Date:  06/14/2020   ID:  Timothy Mckinney, DOB August 23, 1950, MRN [id number]  PCP:  [name], MD  [facility] Cardiologist:  Candee Furbish, MD  [facility] Electrophysiologist:  None   Referring MD: [name], MD     History of Present Illness:    Timothy Mckinney is a 69 y.o. male with cirrhosis here for the evaluation of paroxysmal atrial fibrillation.  Discharge summary from 06/10/2020 reviewed.  He presented with acute mental status.  Lactulose, rifaximin.  Unable to afford this.  He was treated with antibiotic for 4 days cultures were negative.  There was initial concern for aspiration pneumonia but he had no respiratory symptoms.  He also has a history of GI bleeding.  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  He was placed on PPI.  Coagulopathy of liver disease noted.  Paroxysmal atrial fibrillation was noted during his hospital stay and he was deemed not a candidate for anticoagulation.  Also has a history of alcohol use, was sober for 4 months during discharge summary.  He received high-dose thiamine.  He is taking metoprolol succinate 12.5 mg  His wife, [name], runs our TAVR and LVAD registry, works under [employer].  Past Medical History:  Diagnosis Date  . Abnormal chest x-ray    ? granulomata  . Arthritis    knees and back   . DDD (degenerative disc disease), lumbar    MRI 08/2005  . Dry eyes, bilateral    Dr Bing Plume  . Elevated liver enzymes    HISTORY OF ELEVATED LIVER ENZYMES  . GERD (gastroesophageal reflux disease)    hx of   . Hypertension    NEW DIAGNOSIS AND STARTED ON METOPROLOL ON 10/04/11  . Hyperuricemia   . 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 Nephrolithiasis    Dr Serita Butcher  . Sleep apnea    STOP BANG SCORE OF 4  . Thrombocytopenia ([place])    HX OF LOW PLATELET  COUNT    Past Surgical History:  Procedure Laterality Date  . ANAL FISTULECTOMY  06/01/2012   Procedure: FISTULECTOMY ANAL;  Surgeon: Leighton Ruff, MD;  Location: [facility];  Service: General;  Laterality: N/A;  Mucosal Advancement Flap   . BIOPSY  04/21/2020   Procedure: BIOPSY;  Surgeon: Carol Ada, MD;  Location: [facility] ENDOSCOPY;  Service: Endoscopy;;  . CARDIAC CATHETERIZATION     in 40s, negative  . colon ulcer  2011  . COLONOSCOPY W/ POLYPECTOMY  2011   Dr Sharlett Iles  . ESOPHAGOGASTRODUODENOSCOPY (EGD) WITH PROPOFOL N/A 04/21/2020   Procedure: ESOPHAGOGASTRODUODENOSCOPY (EGD) WITH PROPOFOL;  Surgeon: Carol Ada, MD;  Location: [facility];  Service: Endoscopy;  Laterality: N/A;  . ESOPHAGOGASTRODUODENOSCOPY (EGD) WITH PROPOFOL N/A 06/09/2020   Procedure: ESOPHAGOGASTRODUODENOSCOPY (EGD) WITH PROPOFOL;  Surgeon: Carol Ada, MD;  Location: [facility];  Service: Endoscopy;  Laterality: N/A;  . HOT HEMOSTASIS N/A 06/09/2020   Procedure: HOT HEMOSTASIS (ARGON PLASMA COAGULATION/BICAP);  Surgeon: Carol Ada, MD;  Location: [facility];  Service: Endoscopy;  Laterality: N/A;  . KNEE ARTHROSCOPY  2009    L  . KNEE SURGERY     april 2013 total knee replacement removed  . LITHOTRIPSY    . tear duct plugs    . TOTAL KNEE ARTHROPLASTY  2011   Workman's Comp case, Dr Veverly Fells    Current Medications: No outpatient medications  have been marked as taking for the 06/14/20 encounter (Office Visit) with Jerline Pain, MD.     Allergies:   Penicillins   Social History   Socioeconomic History  . Marital status: Married    Spouse name: [name]  . Number of children: Not on file  . Years of education: Not on file  . Highest education level: Not on file  Occupational History  . Occupation: retired Hotel manager  Tobacco Use  . Smoking status: Never Smoker  . Smokeless tobacco: Never Used  Vaping Use  . Vaping Use: Never used  Substance and Sexual Activity  . Alcohol use: Yes     Comment: none in 3 months, prior 4 to 5 times per week  with heavy use  . Drug use: No  . Sexual activity: Not on file  Other Topics Concern  . Not on file  Social History Narrative  . Not on file   Social Determinants of Health   Financial Resource Strain: Not on file  Food Insecurity: No Food Insecurity  . Worried About Charity fundraiser in the Last Year: Never true  . Ran Out of Food in the Last Year: Never true  Transportation Needs: No Transportation Needs  . Lack of Transportation (Medical): No  . Lack of Transportation (Non-Medical): No  Physical Activity: Not on file  Stress: Not on file  Social Connections: Not on file     Family History: The patient's family history includes Cancer in his paternal grandfather and sister; Colon polyps in his mother and sister; Dementia in his mother; Hearing loss in his brother; Heart attack (age of onset: 77) in his father. There is no history of Stroke.  ROS:   Please see the history of present illness.     All other systems reviewed and are negative.  EKGs/Labs/Other Studies Reviewed:    The following studies were reviewed today:   EKG:  04/23/2020 shows atrial fibrillation well rate controlled at 84.  06/06/2020 shows what looks like sinus tachycardia 104 bpm.  Recent Labs: 05/22/2020: TSH 1.52 06/10/2020: ALT 23; BUN 14; Creatinine, Ser 0.81; Hemoglobin 8.4; Platelets 75; Potassium 3.5; Sodium 133  Recent Lipid Panel    Component Value Date/Time   CHOL 177 03/04/2019 1038   TRIG 138.0 03/04/2019 1038   HDL 36.00 (L) 03/04/2019 1038   CHOLHDL 5 03/04/2019 1038   VLDL 27.6 03/04/2019 1038   LDLCALC 113 (H) 03/04/2019 1038     Risk Assessment/Calculations:       Physical Exam:    VS:  BP (!) 100/40 (BP Location: Left Arm, Patient Position: Sitting, Cuff Size: Normal)   Pulse 75   Ht 5\' 11"  (1.803 m)   Wt 223 lb (101.2 kg)   SpO2 98%   BMI 31.10 kg/m     Wt Readings from Last 3 Encounters:  06/14/20 223 lb  (101.2 kg)  06/09/20 210 lb (95.3 kg)  05/22/20 217 lb 12.8 oz (98.8 kg)    GEN:  Well nourished, well developed in no acute distress HEENT: Normal, mild jaundice noted NECK: No JVD; No carotid bruits LYMPHATICS: No lymphadenopathy CARDIAC: RRR, no murmurs, rubs, gallops RESPIRATORY:  Clear to auscultation without rales, wheezing or rhonchi  ABDOMEN: Soft, non-tender, non-distended MUSCULOSKELETAL:  3+LE Bilat edema; No deformity  SKIN: Warm and dry NEUROLOGIC:  Alert and oriented x 3 PSYCHIATRIC:  Normal affect   ASSESSMENT:    1. Paroxysmal atrial fibrillation (HCC)   2. Alcoholic cirrhosis of liver without ascites (  [place])    PLAN:    In order of problems listed above:  Paroxysmal atrial fibrillation -This was noted during prior hospitalization in December.  Given his extensive prior history of GI bleeding, duodenal ulcers noted as well as vascular ectasia in the antrum of the stomach, he is not an anticoagulation candidate due to high bleeding risk.  He is also not a candidate for further ablative therapy from a heart perspective. -Agree with metoprolol.  Dose very low given his underlying pressures of 100/40. -I will check echocardiogram since it has been 5 years. -No changes required in therapy. -Currently appears in normal rhythm.  Cirrhosis Prior GI bleed, hemoglobin 8.4 at last check. -Notes reviewed from prior hospitalization.  Continue with GI follow-up.  Family history of coronary disease -Father died of cardiac arrest at age 44.  Unable to utilize aspirin or statin.     Medication Adjustments/Labs and Tests Ordered: Current medicines are reviewed at length with the patient today.  Concerns regarding medicines are outlined above.  Orders Placed This Encounter  Procedures  . ECHOCARDIOGRAM COMPLETE   No orders of the defined types were placed in this encounter.   Patient Instructions  Medication Instructions:  The current medical regimen is effective;   continue present plan and medications.  *If you need a refill on your cardiac medications before your next appointment, please call your pharmacy*  Testing/Procedures: Your physician has requested that you have an echocardiogram. Echocardiography is a painless test that uses sound waves to create images of your heart. It provides your doctor with information about the size and shape of your heart and how well your heart's chambers and valves are working. This procedure takes approximately one hour. There are no restrictions for this procedure.  Follow-Up: At [facility], you and your health needs are our priority.  As part of our continuing mission to provide you with exceptional heart care, we have created designated Provider Care Teams.  These Care Teams include your primary Cardiologist (physician) and Advanced Practice Providers (APPs -  Physician Assistants and Nurse Practitioners) who all work together to provide you with the care you need, when you need it.  We recommend signing up for the patient portal called "MyChart".  Sign up information is provided on this After Visit Summary.  MyChart is used to connect with patients for Virtual Visits (Telemedicine).  Patients are able to view lab/test results, encounter notes, upcoming appointments, etc.  Non-urgent messages can be sent to your provider as well.   To learn more about what you can do with MyChart, go to [email or web address].    Your next appointment:   6 month(s)  The format for your next appointment:   In Person  Provider:   Candee Furbish, MD   Thank you for choosing [facility]!!         Signed, Candee Furbish, MD  06/14/2020 12:49 PM    [place]

## 2020-06-16 ENCOUNTER — Telehealth: Payer: Self-pay | Admitting: Family Medicine

## 2020-06-16 NOTE — Telephone Encounter (Signed)
Forms placed in [place] bin in back office for to be filled.

## 2020-06-16 NOTE — Telephone Encounter (Signed)
I have placed [place] orders in the bin up front with a charge sheet.  Order # [id number]

## 2020-06-19 ENCOUNTER — Telehealth: Payer: Self-pay

## 2020-06-19 ENCOUNTER — Telehealth: Payer: Self-pay | Admitting: Family Medicine

## 2020-06-19 ENCOUNTER — Other Ambulatory Visit: Payer: Self-pay

## 2020-06-19 NOTE — Telephone Encounter (Signed)
Sent to provider as a medication refill for approval.

## 2020-06-19 NOTE — Telephone Encounter (Signed)
Patient needs the following medications refilled.  They were prescribed while he was in Rehab.  Please send to [facility] on [address], [place]  Medication:  Protonix 40 mg 1 twice a day Folic Acid - 1 mg. - once a day Levocetirizine - 5 mg - once a day   Please advise

## 2020-06-19 NOTE — Telephone Encounter (Signed)
Patient needs the following medications refilled.  They were prescribed while he was in Rehab.  Please send to [facility] on [address], [place]  Medication:  Protonix 40 mg 1 twice a day Folic Acid - 1 mg. - once a day Levocetirizine - 5 mg - once a day  LOV 05/22/20 NOV 06/21/20 Please advise

## 2020-06-19 NOTE — Progress Notes (Signed)
Chronic Care Management Pharmacy Assistant   Name: Timothy Mckinney  MRN: [id number] DOB: 05/05/1951  Reason for Encounter: Disease State   PCP : [name], MD  Allergies:   Allergies  Allergen Reactions  . Penicillins Swelling    Diffuse swelling    Medications: Outpatient Encounter Medications as of 06/19/2020  Medication Sig  . acetaminophen (TYLENOL) 500 MG tablet Take 500 mg by mouth every 6 (six) hours as needed for moderate pain.  . cycloSPORINE (RESTASIS) 0.05 % ophthalmic emulsion Place 1 drop into both eyes daily.  . fluticasone (FLONASE) 50 MCG/ACT nasal spray Place 1 spray into both nostrils daily as needed for allergies or rhinitis.  . folic acid (FOLVITE) 1 MG tablet Take 1 tablet (1 mg total) by mouth daily.  . furosemide (LASIX) 40 MG tablet Take 1 tablet (40 mg total) by mouth daily.  Marland Kitchen lactulose (CHRONULAC) 10 GM/15ML solution Take 15 mLs (10 g total) by mouth 2 (two) times daily.  Marland Kitchen levocetirizine (XYZAL) 5 MG tablet Take 5 mg by mouth every evening.  . metoprolol succinate (TOPROL-XL) 25 MG 24 hr tablet TAKE 1 TABLET(25 MG) BY MOUTH DAILY (Patient taking differently: Take 12.5 mg by mouth daily. )  . pantoprazole (PROTONIX) 40 MG tablet Take 1 tablet (40 mg total) by mouth 2 (two) times daily.  . potassium chloride SA (KLOR-CON) 20 MEQ tablet Take 1 tablet (20 mEq total) by mouth daily.  . rifaximin (XIFAXAN) 550 MG TABS tablet Take 1 tablet (550 mg total) by mouth 2 (two) times daily.  Marland Kitchen spironolactone (ALDACTONE) 50 MG tablet Take 1 tablet (50 mg total) by mouth daily. (Patient taking differently: Take 50 mg by mouth 2 (two) times daily.)  . sucralfate (CARAFATE) 1 g tablet Take 1 tablet (1 g total) by mouth 4 (four) times daily -  with meals and at bedtime.  . thiamine 100 MG tablet Take 1 tablet (100 mg total) by mouth daily.   No facility-administered encounter medications on file as of 06/19/2020.    Current Diagnosis: Patient Active  Problem List   Diagnosis Date Noted  . AMS (altered mental status) 06/05/2020  . Sepsis ([place]) 06/05/2020  . A-fib ([place]) 05/22/2020  . Duodenal ulcer 05/22/2020  . Encephalopathy 05/22/2020  . Blood loss anemia 05/22/2020  . Thyroiditis, unspecified 05/22/2020  . Acute hepatic encephalopathy 04/19/2020  . Edema 01/20/2019  . Coagulopathy ([place]) 07/06/2018  . Kidney stones 07/06/2018  . Lower extremity edema 04/30/2018  . Folate deficiency 02/25/2018  . AKI (acute kidney injury) ([place]) 02/25/2018  . Chronic low back pain 02/11/2018  . Alcoholic liver disease ([place]) 07/29/2017  . Other pancytopenia ([place]) 07/16/2017  . PND (post-nasal drip) 06/17/2016  . Generalized anxiety disorder 06/13/2015  . Physical exam 12/09/2014  . Sarcoidosis ([place]) 11/09/2014  . Multiple pulmonary nodules 11/09/2014  . Hilar adenopathy 11/09/2014  . Knee pain   . Pulmonary nodules with adenopathy (since 2001 CT Chest) 10/15/2014  . Obesity (BMI 30-39.9) 08/05/2014  . Hypertension 08/05/2014  . 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 TRANSAMINASES, SERUM, ELEVATED 08/15/2009  . GERD (gastroesophageal reflux disease) 06/12/2009  . NEPHROLITHIASIS, HX OF 06/12/2009  . Hyperuricemia 06/12/2009  . CHEST XRAY, ABNORMAL 06/12/2009    Reviewed chart prior to disease state call. Spoke with patient regarding BP  Recent Office Vitals: BP Readings from Last 3 Encounters:  06/14/20 (!) 100/40  06/10/20 (!) 144/54  05/22/20 117/80   Pulse Readings from Last 3 Encounters:  06/14/20 75  06/10/20 96  04/26/20 76    Wt Readings from Last 3 Encounters:  06/14/20 223 lb (101.2 kg)  06/09/20 210 lb (95.3 kg)  05/22/20 217 lb 12.8 oz (98.8 kg)     Kidney Function Lab Results  Component Value Date/Time   CREATININE 0.81 06/10/2020 05:40 AM   CREATININE 0.60 (L) 06/09/2020 04:49 AM   CREATININE 1.04 03/21/2020 10:42 AM   CREATININE 1.73  (H) 02/23/2018 01:21 PM   CREATININE 1.04 01/02/2018 03:46 PM   CREATININE 0.8 07/04/2017 03:32 PM   CREATININE 0.8 05/15/2017 03:06 PM   GFR 65.51 05/22/2020 01:30 PM   GFRNONAA >60 06/10/2020 05:40 AM   GFRNONAA 39 (L) 02/23/2018 01:21 PM   GFRAA >60 12/03/2019 11:10 AM   GFRAA 45 (L) 02/23/2018 01:21 PM    BMP Latest Ref Rng & Units 06/10/2020 06/09/2020 06/08/2020  Glucose 70 - 99 mg/dL 97 104(H) 102(H)  BUN 8 - 23 mg/dL 14 15 15   Creatinine 0.61 - 1.24 mg/dL 0.81 0.60(L) 0.92  BUN/Creat Ratio 6 - 22 (calc) - - -  Sodium 135 - 145 mmol/L 133(L) 133(L) 132(L)  Potassium 3.5 - 5.1 mmol/L 3.5 3.5 3.9  Chloride 98 - 111 mmol/L 100 102 104  CO2 22 - 32 mmol/L 22 23 25   Calcium 8.9 - 10.3 mg/dL 8.2(L) 8.3(L) 8.3(L)     . Current antihypertensive regimen:                 - metoprolol succinate (TOPROL-XL) 25 MG 24 hr tablet . How often are you checking your Blood Pressure? Patients wife stated she does not really check at home . Current home BP readings: N/A . What recent interventions/DTPs have been made by any provider to improve Blood Pressure control since last CPP Visit: None noted . Any recent hospitalizations or ED visits since last visit with CPP? Yes               06-05-20 Patient was seen in ED presenting with AMS. . What diet changes have been made to improve Blood Pressure Control?  o Patients wife stated he is on a Low sodium diet  And he does not eat no beef or pork .  Marland Kitchen What exercise is being done to improve your Blood Pressure Control?  o Patient states he goes to physical therapy twice a week  Adherence Review: Is the patient currently on ACE/ARB medication? No Does the patient have >5 day gap between last estimated fill dates? CPP to request   Spoke with patients wife regarding starting patient assistant application for Rifaximin (XIFAXAN) 550 mg Tabs. Informed patient I will fill in application and mail out for her to complete and return to office once  completed.   [place] ,[place] Pharmacist Assistant [phone]  Follow-Up:  Pharmacist Review

## 2020-06-20 MED ORDER — FOLIC ACID 1 MG PO TABS: 1.0000 mg | ORAL_TABLET | Freq: Every day | ORAL | 1 refills | Status: DC

## 2020-06-20 MED ORDER — LEVOCETIRIZINE DIHYDROCHLORIDE 5 MG PO TABS: 5.0000 mg | ORAL_TABLET | Freq: Every evening | ORAL | 1 refills | Status: DC

## 2020-06-20 MED ORDER — PANTOPRAZOLE SODIUM 40 MG PO TBEC: 40.0000 mg | DELAYED_RELEASE_TABLET | Freq: Two times a day (BID) | ORAL | 1 refills | Status: DC

## 2020-06-20 NOTE — Telephone Encounter (Signed)
Picked up from the back and faxed to the # provided on the form  

## 2020-06-21 ENCOUNTER — Encounter: Payer: Self-pay | Admitting: Family Medicine

## 2020-06-21 ENCOUNTER — Telehealth (INDEPENDENT_AMBULATORY_CARE_PROVIDER_SITE_OTHER): Payer: PPO | Admitting: Family Medicine

## 2020-06-21 DIAGNOSIS — K709 Alcoholic liver disease, unspecified: Secondary | ICD-10-CM | POA: Diagnosis not present

## 2020-06-21 DIAGNOSIS — R4182 Altered mental status, unspecified: Secondary | ICD-10-CM

## 2020-06-21 DIAGNOSIS — D500 Iron deficiency anemia secondary to blood loss (chronic): Secondary | ICD-10-CM | POA: Diagnosis not present

## 2020-06-21 DIAGNOSIS — K7682 Hepatic encephalopathy: Secondary | ICD-10-CM

## 2020-06-21 DIAGNOSIS — K7200 Acute and subacute hepatic failure without coma: Secondary | ICD-10-CM | POA: Diagnosis not present

## 2020-06-21 NOTE — Progress Notes (Signed)
Virtual Visit via Video   I connected with patient on 06/21/20 at 11:00 AM EST by a video enabled telemedicine application and verified that I am speaking with the correct person using two identifiers.  Location patient: Home Location provider: [facility], Office Persons participating in the virtual visit: Patient, Provider, [place] (Sabrina M)  I discussed the limitations of evaluation and management by telemedicine and the availability of in person appointments. The patient expressed understanding and agreed to proceed.  Subjective:   HPI:   Hospital f/u- Pt was admitted 12/6-12/11 w/ AMS.  He was dx'd w/ hepatic encephalopathy w/o obvious cause.  He skipped 1 day of Lactulose due to diarrhea prior to admission.  He was restarted on his daily dose and restarted on Rifaximin.  His Hgb trended down during his hospitalization (7.4 at nadir) and he was given 1 unit PRBCs.  Hgb was 8.4 on d/c.  EGD on 12/10 showed no active bleeding.  Today pt reports feeling 'a lot better than I was feeling'.  Is able to 'get around' and feels that he is able to 'ditch the walker' but wife insists that he use it.  Is not having diarrhea at this time but is taking Lactulose regularly.  Now taking the Rifaximin daily.  Mental confusion has cleared.  Doing PT twice weekly.  No abd pain, N/V.  No dark or tarry stools or evidence of GI bleeding.  Pt reports appetite has returned.  Has upcoming appt on 1/4 w/ liver specialist.  Wife reports speech has improved, thought process has mostly cleared but still not back to previous baseline.    Reviewed labs, d/c summary, imaging  ROS:   See pertinent positives and negatives per HPI.  Patient Active Problem List   Diagnosis Date Noted  . AMS (altered mental status) 06/05/2020  . Sepsis ([place]) 06/05/2020  . A-fib ([place]) 05/22/2020  . Duodenal ulcer 05/22/2020  . Encephalopathy 05/22/2020  . Blood loss anemia 05/22/2020  . Thyroiditis, unspecified 05/22/2020   . Acute hepatic encephalopathy 04/19/2020  . Edema 01/20/2019  . Coagulopathy ([place]) 07/06/2018  . Kidney stones 07/06/2018  . Lower extremity edema 04/30/2018  . Folate deficiency 02/25/2018  . AKI (acute kidney injury) ([place]) 02/25/2018  . Chronic low back pain 02/11/2018  . Alcoholic liver disease ([place]) 07/29/2017  . Other pancytopenia ([place]) 07/16/2017  . PND (post-nasal drip) 06/17/2016  . Generalized anxiety disorder 06/13/2015  . Physical exam 12/09/2014  . Sarcoidosis ([place]) 11/09/2014  . Multiple pulmonary nodules 11/09/2014  . Hilar adenopathy 11/09/2014  . Knee pain   . Pulmonary nodules with adenopathy (since 2001 CT Chest) 10/15/2014  . Obesity (BMI 30-39.9) 08/05/2014  . Hypertension 08/05/2014  . 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 TRANSAMINASES, SERUM, ELEVATED 08/15/2009  . GERD (gastroesophageal reflux disease) 06/12/2009  . NEPHROLITHIASIS, HX OF 06/12/2009  . Hyperuricemia 06/12/2009  . CHEST XRAY, ABNORMAL 06/12/2009    Social History   Tobacco Use  . Smoking status: Never Smoker  . Smokeless tobacco: Never Used  Substance Use Topics  . Alcohol use: Yes    Comment: none in 3 months, prior 4 to 5 times per week  with heavy use    Current Outpatient Medications:  .  acetaminophen (TYLENOL) 500 MG tablet, Take 500 mg by mouth every 6 (six) hours as needed for moderate pain., Disp: , Rfl:  .  cycloSPORINE (RESTASIS) 0.05 % ophthalmic emulsion, Place  1 drop into both eyes daily., Disp: , Rfl:  .  fluticasone (FLONASE) 50 MCG/ACT nasal spray, Place 1 spray into both nostrils daily as needed for allergies or rhinitis., Disp: , Rfl:  .  folic acid (FOLVITE) 1 MG tablet, Take 1 tablet (1 mg total) by mouth daily., Disp: 90 tablet, Rfl: 1 .  furosemide (LASIX) 40 MG tablet, Take 1 tablet (40 mg total) by mouth daily., Disp: 30 tablet, Rfl:  .  lactulose (CHRONULAC) 10 GM/15ML solution, Take  15 mLs (10 g total) by mouth 2 (two) times daily. (Patient taking differently: Take 30 g by mouth 2 (two) times daily.), Disp: 236 mL, Rfl: 0 .  levocetirizine (XYZAL) 5 MG tablet, Take 1 tablet (5 mg total) by mouth every evening., Disp: 90 tablet, Rfl: 1 .  metoprolol succinate (TOPROL-XL) 25 MG 24 hr tablet, TAKE 1 TABLET(25 MG) BY MOUTH DAILY (Patient taking differently: Take 12.5 mg by mouth daily.), Disp: 90 tablet, Rfl: 0 .  pantoprazole (PROTONIX) 40 MG tablet, Take 1 tablet (40 mg total) by mouth 2 (two) times daily., Disp: 180 tablet, Rfl: 1 .  potassium chloride SA (KLOR-CON) 20 MEQ tablet, Take 1 tablet (20 mEq total) by mouth daily., Disp: 30 tablet, Rfl: 6 .  rifaximin (XIFAXAN) 550 MG TABS tablet, Take 1 tablet (550 mg total) by mouth 2 (two) times daily., Disp: 30 tablet, Rfl: 1 .  spironolactone (ALDACTONE) 50 MG tablet, Take 1 tablet (50 mg total) by mouth daily. (Patient taking differently: Take 50 mg by mouth 2 (two) times daily.), Disp: , Rfl:  .  sucralfate (CARAFATE) 1 g tablet, Take 1 tablet (1 g total) by mouth 4 (four) times daily -  with meals and at bedtime., Disp: , Rfl:  .  thiamine 100 MG tablet, Take 1 tablet (100 mg total) by mouth daily., Disp: , Rfl:   Allergies  Allergen Reactions  . Penicillins Swelling    Diffuse swelling    Objective:   There were no vitals taken for this visit. AAOx3, NAD NCAT, EOMI No obvious CN deficits Pt is able to speak clearly, coherently without shortness of breath or increased work of breathing.  Thought process is linear.  Mood is appropriate.   Assessment and Plan:    See Problem Based Charting  [name], MD 06/21/2020

## 2020-06-21 NOTE — Progress Notes (Signed)
I connected with  Timothy Mckinney on 06/21/20 by a video enabled telemedicine application and verified that I am speaking with the correct person using two identifiers.   I discussed the limitations of evaluation and management by telemedicine. The patient expressed understanding and agreed to proceed.

## 2020-06-29 ENCOUNTER — Other Ambulatory Visit: Payer: Self-pay

## 2020-06-29 NOTE — Assessment & Plan Note (Signed)
Resolving.  Pt reports he is feeling much better.  Wife also notes big improvement but doesn't feel he is back to his pre-hospital baseline.  Discussed that with each episode, he may be establishing a new baseline.  She understands.

## 2020-06-29 NOTE — Telephone Encounter (Signed)
Patient is requesting refill be sent to pharmacy. You are not original prescriber. Please advise

## 2020-06-29 NOTE — Assessment & Plan Note (Signed)
Pt has upcoming appt w/ liver specialist on 1/4.  He is taking his Lactulose regularly and his Rifaximin.  Reviewed signs and sxs with wife that should prompt immediate ER attention.  She is aware.  Will follow along.

## 2020-06-29 NOTE — Assessment & Plan Note (Signed)
Pt's Hgb dropped to 7.4.  He was given 1 unit PRBCs and Hgb improved to 8.4 on d/c.  EGD didn't show any active bleeding.  Pt denies any BRBPR or dark tarry stools since d/c.  Will follow.

## 2020-06-29 NOTE — Assessment & Plan Note (Signed)
Much improved.  Was likely due to hepatic encephalopathy.

## 2020-07-03 MED ORDER — SUCRALFATE 1 G PO TABS: 1.0000 g | ORAL_TABLET | Freq: Three times a day (TID) | ORAL | 0 refills | Status: DC

## 2020-07-04 DIAGNOSIS — M79606 Pain in leg, unspecified: Secondary | ICD-10-CM | POA: Diagnosis not present

## 2020-07-04 DIAGNOSIS — Z791 Long term (current) use of non-steroidal anti-inflammatories (NSAID): Secondary | ICD-10-CM | POA: Diagnosis not present

## 2020-07-04 DIAGNOSIS — Z9181 History of falling: Secondary | ICD-10-CM | POA: Diagnosis not present

## 2020-07-04 DIAGNOSIS — H04123 Dry eye syndrome of bilateral lacrimal glands: Secondary | ICD-10-CM | POA: Diagnosis not present

## 2020-07-04 DIAGNOSIS — I10 Essential (primary) hypertension: Secondary | ICD-10-CM | POA: Diagnosis not present

## 2020-07-04 DIAGNOSIS — K219 Gastro-esophageal reflux disease without esophagitis: Secondary | ICD-10-CM | POA: Diagnosis not present

## 2020-07-04 DIAGNOSIS — E538 Deficiency of other specified B group vitamins: Secondary | ICD-10-CM | POA: Diagnosis not present

## 2020-07-04 DIAGNOSIS — D649 Anemia, unspecified: Secondary | ICD-10-CM | POA: Diagnosis not present

## 2020-07-04 DIAGNOSIS — D869 Sarcoidosis, unspecified: Secondary | ICD-10-CM | POA: Diagnosis not present

## 2020-07-04 DIAGNOSIS — D61818 Other pancytopenia: Secondary | ICD-10-CM | POA: Diagnosis not present

## 2020-07-04 DIAGNOSIS — M1990 Unspecified osteoarthritis, unspecified site: Secondary | ICD-10-CM | POA: Diagnosis not present

## 2020-07-04 DIAGNOSIS — D696 Thrombocytopenia, unspecified: Secondary | ICD-10-CM | POA: Diagnosis not present

## 2020-07-04 DIAGNOSIS — R41841 Cognitive communication deficit: Secondary | ICD-10-CM | POA: Diagnosis not present

## 2020-07-04 DIAGNOSIS — E785 Hyperlipidemia, unspecified: Secondary | ICD-10-CM | POA: Diagnosis not present

## 2020-07-04 DIAGNOSIS — G8929 Other chronic pain: Secondary | ICD-10-CM | POA: Diagnosis not present

## 2020-07-04 DIAGNOSIS — K7290 Hepatic failure, unspecified without coma: Secondary | ICD-10-CM | POA: Diagnosis not present

## 2020-07-04 DIAGNOSIS — K7030 Alcoholic cirrhosis of liver without ascites: Secondary | ICD-10-CM | POA: Diagnosis not present

## 2020-07-04 DIAGNOSIS — Z7901 Long term (current) use of anticoagulants: Secondary | ICD-10-CM | POA: Diagnosis not present

## 2020-07-04 DIAGNOSIS — K7200 Acute and subacute hepatic failure without coma: Secondary | ICD-10-CM | POA: Diagnosis not present

## 2020-07-04 DIAGNOSIS — I480 Paroxysmal atrial fibrillation: Secondary | ICD-10-CM | POA: Diagnosis not present

## 2020-07-04 DIAGNOSIS — E43 Unspecified severe protein-calorie malnutrition: Secondary | ICD-10-CM | POA: Diagnosis not present

## 2020-07-04 DIAGNOSIS — F411 Generalized anxiety disorder: Secondary | ICD-10-CM | POA: Diagnosis not present

## 2020-07-08 ENCOUNTER — Other Ambulatory Visit: Payer: Self-pay

## 2020-07-08 ENCOUNTER — Ambulatory Visit
Admission: RE | Admit: 2020-07-08 | Discharge: 2020-07-08 | Disposition: A | Discharge status: Home / Self Care | Payer: PPO | Source: Ambulatory Visit | Attending: Nurse Practitioner | Admitting: Nurse Practitioner

## 2020-07-08 DIAGNOSIS — K7030 Alcoholic cirrhosis of liver without ascites: Secondary | ICD-10-CM

## 2020-07-08 DIAGNOSIS — K766 Portal hypertension: Secondary | ICD-10-CM | POA: Diagnosis not present

## 2020-07-08 DIAGNOSIS — I8500 Esophageal varices without bleeding: Secondary | ICD-10-CM | POA: Diagnosis not present

## 2020-07-08 DIAGNOSIS — K8020 Calculus of gallbladder without cholecystitis without obstruction: Secondary | ICD-10-CM | POA: Diagnosis not present

## 2020-07-08 MED ORDER — GADOBENATE DIMEGLUMINE 529 MG/ML IV SOLN
20.0000 mL | Freq: Once | INTRAVENOUS | Status: AC | PRN
  Administered 2020-07-08: 20 mL via INTRAVENOUS

## 2020-07-10 ENCOUNTER — Telehealth: Payer: Self-pay

## 2020-07-10 NOTE — Progress Notes (Signed)
Patient called and would like to speak to CPP . Patient stated he has some medication changes and questions he would like to discuss . Patient stated if cpp could call his wife regarding this information.   Patients wife name is Timothy Mckinney and her phone number is [phone].  Georgiana Shore ,[place] Pharmacist Assistant [phone]

## 2020-07-12 ENCOUNTER — Telehealth: Payer: Self-pay | Admitting: Family Medicine

## 2020-07-12 ENCOUNTER — Telehealth: Payer: Self-pay

## 2020-07-12 DIAGNOSIS — M47816 Spondylosis without myelopathy or radiculopathy, lumbar region: Secondary | ICD-10-CM | POA: Diagnosis not present

## 2020-07-12 NOTE — Telephone Encounter (Signed)
Patients wife needs an rx for Xifaxan 550 mg twice a day - Needs to be sent to [facility] on [address].  Dr. Birdie Riddle has never prescribed this medication.  It was prescribed by a hospitalist at the hospital.  Please advise.  Patient's wife states that Dr. Birdie Riddle knows he is on this medication

## 2020-07-12 NOTE — Telephone Encounter (Signed)
See below

## 2020-07-13 ENCOUNTER — Other Ambulatory Visit: Payer: Self-pay

## 2020-07-13 ENCOUNTER — Ambulatory Visit (HOSPITAL_COMMUNITY): Payer: PPO | Attending: Cardiology

## 2020-07-13 DIAGNOSIS — I480 Paroxysmal atrial fibrillation: Secondary | ICD-10-CM | POA: Insufficient documentation

## 2020-07-13 LAB — ECHOCARDIOGRAM COMPLETE
Area-P 1/2: 4.68 cm2
S' Lateral: 3.7 cm

## 2020-07-13 NOTE — Telephone Encounter (Signed)
Reviewing in PCP absence.  Please call patient's wife.  See they recently had a visit with his liver specialist.  The prescription should be coming from them.  Have they reached out to them?

## 2020-07-13 NOTE — Telephone Encounter (Signed)
attempted to return call regarding medication changes

## 2020-07-13 NOTE — Telephone Encounter (Signed)
Unable to get in contact with the patient's wife. LVM with the instructions Einar Pheasant provided. Office number was provided in case she has any additional questions or concerns.

## 2020-07-14 DIAGNOSIS — M1711 Unilateral primary osteoarthritis, right knee: Secondary | ICD-10-CM | POA: Diagnosis not present

## 2020-07-19 ENCOUNTER — Telehealth: Payer: Self-pay

## 2020-07-19 DIAGNOSIS — M47816 Spondylosis without myelopathy or radiculopathy, lumbar region: Secondary | ICD-10-CM | POA: Diagnosis not present

## 2020-07-19 NOTE — Telephone Encounter (Signed)
Spouse called stating that patient currently takes 50mg  of Spironolactone 2xday.  States that patient is up all night, urinating.  Would like to know if this RX could be cut back?    Please advise.

## 2020-07-20 ENCOUNTER — Telehealth: Payer: Self-pay

## 2020-07-20 NOTE — Telephone Encounter (Signed)
They need to ask the liver specialist if this adjustment can be made

## 2020-07-20 NOTE — Telephone Encounter (Signed)
Called and left pt a message. See phone note for full note.

## 2020-07-20 NOTE — Telephone Encounter (Signed)
Called and left patient a message that per Dr. Birdie Riddle they will have to reach out to his liver specialist to get an adjustment on his spironolactone.

## 2020-07-24 ENCOUNTER — Telehealth: Payer: Self-pay | Admitting: Family Medicine

## 2020-07-24 NOTE — Telephone Encounter (Signed)
Pt's wife called in asking for a new script of the Lasix, she states that the rehab provider was proscribing those tabs but he is no longer in rehab. Please advise, pt uses [facility] on [address].

## 2020-07-25 ENCOUNTER — Other Ambulatory Visit: Payer: Self-pay

## 2020-07-25 DIAGNOSIS — M25561 Pain in right knee: Secondary | ICD-10-CM | POA: Diagnosis not present

## 2020-07-25 DIAGNOSIS — R600 Localized edema: Secondary | ICD-10-CM

## 2020-07-25 MED ORDER — FUROSEMIDE 40 MG PO TABS: 40.0000 mg | ORAL_TABLET | Freq: Every day | ORAL | 1 refills | Status: DC

## 2020-07-25 NOTE — Telephone Encounter (Signed)
Ok to refill furosemide (lasix) 40mg  daily, #90, 1 refill

## 2020-07-25 NOTE — Telephone Encounter (Signed)
Rx sent to pharmacy   

## 2020-07-26 ENCOUNTER — Telehealth: Payer: Self-pay | Admitting: Family Medicine

## 2020-07-26 NOTE — Telephone Encounter (Signed)
Spoke with patient spouse she stated he will CB to scheduled AWV

## 2020-07-27 DIAGNOSIS — R634 Abnormal weight loss: Secondary | ICD-10-CM | POA: Diagnosis not present

## 2020-07-27 DIAGNOSIS — K219 Gastro-esophageal reflux disease without esophagitis: Secondary | ICD-10-CM | POA: Diagnosis not present

## 2020-07-27 DIAGNOSIS — Z86010 Personal history of colonic polyps: Secondary | ICD-10-CM | POA: Diagnosis not present

## 2020-07-27 DIAGNOSIS — D696 Thrombocytopenia, unspecified: Secondary | ICD-10-CM | POA: Diagnosis not present

## 2020-07-27 DIAGNOSIS — K7030 Alcoholic cirrhosis of liver without ascites: Secondary | ICD-10-CM | POA: Diagnosis not present

## 2020-07-27 DIAGNOSIS — K7290 Hepatic failure, unspecified without coma: Secondary | ICD-10-CM | POA: Diagnosis not present

## 2020-07-27 DIAGNOSIS — K8020 Calculus of gallbladder without cholecystitis without obstruction: Secondary | ICD-10-CM | POA: Diagnosis not present

## 2020-07-28 ENCOUNTER — Telehealth: Payer: Self-pay

## 2020-07-28 NOTE — Telephone Encounter (Signed)
  [facility] Care Management Pharmacy Name: Timothy Mckinney     MRN: [id number]     DOB: [birth year]  Timothy Mckinney, 70 y.o., male, accompanied by Wife Timothy Mckinney. Discussed several questions related HE and xifaxan, reviewed opportunities for financial support. Medication list updated.   Currently tolerating lactulose well, cost of xifaxan is very concerning to them, have tried for tier exception through insurance without any luck. In the process of applying for patient assistance for xifaxan however good chance they may not meet the criteria.   Complaint of cramping in hand, taking furosemide 40 mg once daily potassium 20 meq once daily, is awaiting labs from Dr [name] drawn yesterday.  Reviewed potential grant sources compatible with medicare, could not locate funding through [employer]well, [employer] or any associated with ALF.  BMP Latest Ref Rng & Units 06/10/2020 06/09/2020 06/08/2020  Glucose 70 - 99 mg/dL 97 104(H) 102(H)  BUN 8 - 23 mg/dL 14 15 15   Creatinine 0.61 - 1.24 mg/dL 0.81 0.60(L) 0.92  BUN/Creat Ratio 6 - 22 (calc) - - -  Sodium 135 - 145 mmol/L 133(L) 133(L) 132(L)  Potassium 3.5 - 5.1 mmol/L 3.5 3.5 3.9  Chloride 98 - 111 mmol/L 100 102 104  CO2 22 - 32 mmol/L 22 23 25   Calcium 8.9 - 10.3 mg/dL 8.2(L) 8.3(L) 8.3(L)   Time spent talking with pt and wife: 42 minutes.   PCP: [name], MD  Visit follow-up:  . RPH follow-up: 1 month f/u.  Madelin Rear, Pharm.D., BCGP Clinical Pharmacist [facility] [phone]

## 2020-07-30 ENCOUNTER — Encounter (HOSPITAL_COMMUNITY): Payer: Self-pay

## 2020-07-30 ENCOUNTER — Emergency Department (HOSPITAL_COMMUNITY)
Admission: EM | Admit: 2020-07-30 | Discharge: 2020-07-30 | Disposition: A | Discharge status: Home / Self Care | Payer: PPO | Source: Home / Self Care | Attending: Emergency Medicine | Admitting: Emergency Medicine

## 2020-07-30 ENCOUNTER — Other Ambulatory Visit: Payer: Self-pay

## 2020-07-30 DIAGNOSIS — Z8249 Family history of ischemic heart disease and other diseases of the circulatory system: Secondary | ICD-10-CM | POA: Diagnosis not present

## 2020-07-30 DIAGNOSIS — F1021 Alcohol dependence, in remission: Secondary | ICD-10-CM | POA: Diagnosis present

## 2020-07-30 DIAGNOSIS — F22 Delusional disorders: Secondary | ICD-10-CM | POA: Diagnosis not present

## 2020-07-30 DIAGNOSIS — G4730 Sleep apnea, unspecified: Secondary | ICD-10-CM | POA: Diagnosis present

## 2020-07-30 DIAGNOSIS — I480 Paroxysmal atrial fibrillation: Secondary | ICD-10-CM | POA: Diagnosis present

## 2020-07-30 DIAGNOSIS — K7210 Chronic hepatic failure without coma: Secondary | ICD-10-CM | POA: Insufficient documentation

## 2020-07-30 DIAGNOSIS — R569 Unspecified convulsions: Secondary | ICD-10-CM | POA: Diagnosis present

## 2020-07-30 DIAGNOSIS — K219 Gastro-esophageal reflux disease without esophagitis: Secondary | ICD-10-CM | POA: Diagnosis present

## 2020-07-30 DIAGNOSIS — Z809 Family history of malignant neoplasm, unspecified: Secondary | ICD-10-CM | POA: Diagnosis not present

## 2020-07-30 DIAGNOSIS — E860 Dehydration: Secondary | ICD-10-CM | POA: Diagnosis present

## 2020-07-30 DIAGNOSIS — I10 Essential (primary) hypertension: Secondary | ICD-10-CM | POA: Insufficient documentation

## 2020-07-30 DIAGNOSIS — Z96652 Presence of left artificial knee joint: Secondary | ICD-10-CM | POA: Insufficient documentation

## 2020-07-30 DIAGNOSIS — K766 Portal hypertension: Secondary | ICD-10-CM | POA: Diagnosis present

## 2020-07-30 DIAGNOSIS — F05 Delirium due to known physiological condition: Secondary | ICD-10-CM | POA: Diagnosis present

## 2020-07-30 DIAGNOSIS — Z8719 Personal history of other diseases of the digestive system: Secondary | ICD-10-CM | POA: Diagnosis not present

## 2020-07-30 DIAGNOSIS — I8510 Secondary esophageal varices without bleeding: Secondary | ICD-10-CM | POA: Diagnosis present

## 2020-07-30 DIAGNOSIS — K7200 Acute and subacute hepatic failure without coma: Secondary | ICD-10-CM | POA: Diagnosis not present

## 2020-07-30 DIAGNOSIS — E872 Acidosis: Secondary | ICD-10-CM | POA: Diagnosis present

## 2020-07-30 DIAGNOSIS — K7040 Alcoholic hepatic failure without coma: Secondary | ICD-10-CM | POA: Diagnosis present

## 2020-07-30 DIAGNOSIS — D696 Thrombocytopenia, unspecified: Secondary | ICD-10-CM | POA: Insufficient documentation

## 2020-07-30 DIAGNOSIS — Z20822 Contact with and (suspected) exposure to covid-19: Secondary | ICD-10-CM | POA: Diagnosis present

## 2020-07-30 DIAGNOSIS — Z781 Physical restraint status: Secondary | ICD-10-CM | POA: Diagnosis not present

## 2020-07-30 DIAGNOSIS — R69 Illness, unspecified: Secondary | ICD-10-CM | POA: Diagnosis not present

## 2020-07-30 DIAGNOSIS — K7290 Hepatic failure, unspecified without coma: Secondary | ICD-10-CM | POA: Diagnosis present

## 2020-07-30 DIAGNOSIS — Z8371 Family history of colonic polyps: Secondary | ICD-10-CM | POA: Diagnosis not present

## 2020-07-30 DIAGNOSIS — G9341 Metabolic encephalopathy: Secondary | ICD-10-CM | POA: Diagnosis present

## 2020-07-30 DIAGNOSIS — N179 Acute kidney failure, unspecified: Secondary | ICD-10-CM | POA: Diagnosis present

## 2020-07-30 DIAGNOSIS — Z880 Allergy status to penicillin: Secondary | ICD-10-CM | POA: Diagnosis not present

## 2020-07-30 DIAGNOSIS — R443 Hallucinations, unspecified: Secondary | ICD-10-CM | POA: Diagnosis present

## 2020-07-30 DIAGNOSIS — Z79899 Other long term (current) drug therapy: Secondary | ICD-10-CM | POA: Diagnosis not present

## 2020-07-30 DIAGNOSIS — I6782 Cerebral ischemia: Secondary | ICD-10-CM | POA: Diagnosis not present

## 2020-07-30 DIAGNOSIS — K7030 Alcoholic cirrhosis of liver without ascites: Secondary | ICD-10-CM | POA: Diagnosis not present

## 2020-07-30 DIAGNOSIS — G9389 Other specified disorders of brain: Secondary | ICD-10-CM | POA: Diagnosis not present

## 2020-07-30 DIAGNOSIS — M25561 Pain in right knee: Secondary | ICD-10-CM | POA: Diagnosis not present

## 2020-07-30 DIAGNOSIS — M1711 Unilateral primary osteoarthritis, right knee: Secondary | ICD-10-CM | POA: Diagnosis present

## 2020-07-30 DIAGNOSIS — R000 Tachycardia, unspecified: Secondary | ICD-10-CM | POA: Diagnosis not present

## 2020-07-30 DIAGNOSIS — Z9114 Patient's other noncompliance with medication regimen: Secondary | ICD-10-CM | POA: Diagnosis not present

## 2020-07-30 LAB — CBC WITH DIFFERENTIAL/PLATELET
Abs Immature Granulocytes: 0.04 10*3/uL (ref 0.00–0.07)
Basophils Absolute: 0 10*3/uL (ref 0.0–0.1)
Basophils Relative: 0 %
Eosinophils Absolute: 0 10*3/uL (ref 0.0–0.5)
Eosinophils Relative: 0 %
HCT: 30.2 % — ABNORMAL LOW (ref 39.0–52.0)
Hemoglobin: 9.8 g/dL — ABNORMAL LOW (ref 13.0–17.0)
Immature Granulocytes: 1 %
Lymphocytes Relative: 11 %
Lymphs Abs: 0.6 10*3/uL — ABNORMAL LOW (ref 0.7–4.0)
MCH: 32 pg (ref 26.0–34.0)
MCHC: 32.5 g/dL (ref 30.0–36.0)
MCV: 98.7 fL (ref 80.0–100.0)
Monocytes Absolute: 0.5 10*3/uL (ref 0.1–1.0)
Monocytes Relative: 10 %
Neutro Abs: 3.9 10*3/uL (ref 1.7–7.7)
Neutrophils Relative %: 78 %
Platelets: 105 10*3/uL — ABNORMAL LOW (ref 150–400)
RBC: 3.06 MIL/uL — ABNORMAL LOW (ref 4.22–5.81)
RDW: 17.2 % — ABNORMAL HIGH (ref 11.5–15.5)
WBC: 5.1 10*3/uL (ref 4.0–10.5)
nRBC: 0 % (ref 0.0–0.2)

## 2020-07-30 LAB — COMPREHENSIVE METABOLIC PANEL
ALT: 27 U/L (ref 0–44)
AST: 53 U/L — ABNORMAL HIGH (ref 15–41)
Albumin: 3.2 g/dL — ABNORMAL LOW (ref 3.5–5.0)
Alkaline Phosphatase: 130 U/L — ABNORMAL HIGH (ref 38–126)
Anion gap: 9 (ref 5–15)
BUN: 32 mg/dL — ABNORMAL HIGH (ref 8–23)
CO2: 23 mmol/L (ref 22–32)
Calcium: 9.3 mg/dL (ref 8.9–10.3)
Chloride: 101 mmol/L (ref 98–111)
Creatinine, Ser: 1.09 mg/dL (ref 0.61–1.24)
GFR, Estimated: >60 mL/min (ref >60)
Glucose, Bld: 124 mg/dL — ABNORMAL HIGH (ref 70–99)
Potassium: 4.5 mmol/L (ref 3.5–5.1)
Sodium: 133 mmol/L — ABNORMAL LOW (ref 135–145)
Total Bilirubin: 4 mg/dL — ABNORMAL HIGH (ref 0.3–1.2)
Total Protein: 6.5 g/dL (ref 6.5–8.1)

## 2020-07-30 LAB — AMMONIA: Ammonia: 11 umol/L (ref 9–35)

## 2020-07-30 NOTE — ED Triage Notes (Signed)
EMS reports from home, family called out for AMS. Hx of cirrhosis of liver with elevated ammonia in past, Pt arrives A&O X 4 but family states unusual behavior since Wednesday, drove to [employer] and purchased fax machine and laptop without need per family. Calling family members and asking for bank account numbers and displaying paranoia and agitation..  BP 162/84 HR 116 RR 18 Sp02 97 RA CBG 156 Temp 97.6

## 2020-07-30 NOTE — ED Provider Notes (Signed)
[facility] DEPT Provider Note   CSN: [id number] Arrival date & time: 07/30/20  1709     History Chief Complaint  Patient presents with  . Altered Mental Status    Timothy Mckinney is a 70 y.o. male.  Pt presents to the ED today with altered mental status.  Pt said he has a hx of liver cirrhosis with elevated ammonia in the past.  His family said he's been agitated.  He went to [employer] and purchased a fax machine and computer.  Pt said his family is causing him some stress and he just needed to get away from them today.  He bought the computer because he wanted to.  He can account for his whereabouts today.  He denies si/hi.        Past Medical History:  Diagnosis Date  . Abnormal chest x-ray    ? granulomata  . Arthritis    knees and back   . DDD (degenerative disc disease), lumbar    MRI 08/2005  . Dry eyes, bilateral    Dr Bing Plume  . Elevated liver enzymes    HISTORY OF ELEVATED LIVER ENZYMES  . GERD (gastroesophageal reflux disease)    hx of   . Hypertension    NEW DIAGNOSIS AND STARTED ON METOPROLOL ON 10/04/11  . Hyperuricemia   . 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 Nephrolithiasis    Dr Serita Butcher  . Sleep apnea    STOP BANG SCORE OF 4  . Thrombocytopenia ([place])    HX OF LOW PLATELET COUNT    Patient Active Problem List   Diagnosis Date Noted  . AMS (altered mental status) 06/05/2020  . A-fib ([place]) 05/22/2020  . Duodenal ulcer 05/22/2020  . Encephalopathy 05/22/2020  . Blood loss anemia 05/22/2020  . Thyroiditis, unspecified 05/22/2020  . Acute hepatic encephalopathy 04/19/2020  . Edema 01/20/2019  . Coagulopathy ([place]) 07/06/2018  . Kidney stones 07/06/2018  . Lower extremity edema 04/30/2018  . Folate deficiency 02/25/2018  . AKI (acute kidney injury) ([place]) 02/25/2018  . Chronic low back pain 02/11/2018  . Alcoholic liver disease ([place]) 07/29/2017  . Other pancytopenia ([place]) 07/16/2017  . PND (post-nasal  drip) 06/17/2016  . Generalized anxiety disorder 06/13/2015  . Physical exam 12/09/2014  . Sarcoidosis ([place]) 11/09/2014  . Multiple pulmonary nodules 11/09/2014  . Hilar adenopathy 11/09/2014  . Knee pain   . Pulmonary nodules with adenopathy (since 2001 CT Chest) 10/15/2014  . Obesity (BMI 30-39.9) 08/05/2014  . Hypertension 08/05/2014  . 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 TRANSAMINASES, SERUM, ELEVATED 08/15/2009  . GERD (gastroesophageal reflux disease) 06/12/2009  . NEPHROLITHIASIS, HX OF 06/12/2009  . Hyperuricemia 06/12/2009  . CHEST XRAY, ABNORMAL 06/12/2009    Past Surgical History:  Procedure Laterality Date  . ANAL FISTULECTOMY  06/01/2012   Procedure: FISTULECTOMY ANAL;  Surgeon: Leighton Ruff, MD;  Location: [facility] ORS;  Service: General;  Laterality: N/A;  Mucosal Advancement Flap   . BIOPSY  04/21/2020   Procedure: BIOPSY;  Surgeon: Carol Ada, MD;  Location: [facility] ENDOSCOPY;  Service: Endoscopy;;  . CARDIAC CATHETERIZATION     in 40s, negative  . colon ulcer  2011  . COLONOSCOPY W/ POLYPECTOMY  2011   Dr Sharlett Iles  . ESOPHAGOGASTRODUODENOSCOPY (EGD) WITH PROPOFOL N/A 04/21/2020   Procedure: ESOPHAGOGASTRODUODENOSCOPY (EGD) WITH PROPOFOL;  Surgeon: Carol Ada, MD;  Location: [facility] END[facility];  Service: Endoscopy;  Laterality: N/A;  .  ESOPHAGOGASTRODUODENOSCOPY (EGD) WITH PROPOFOL N/A 06/09/2020   Procedure: ESOPHAGOGASTRODUODENOSCOPY (EGD) WITH PROPOFOL;  Surgeon: Carol Ada, MD;  Location: [facility] END[facility];  Service: Endoscopy;  Laterality: N/A;  . HOT HEMOSTASIS N/A 06/09/2020   Procedure: HOT HEMOSTASIS (ARGON PLASMA COAGULATION/BICAP);  Surgeon: Carol Ada, MD;  Location: [facility];  Service: Endoscopy;  Laterality: N/A;  . KNEE ARTHROSCOPY  2009    L  . KNEE SURGERY     april 2013 total knee replacement removed  . LITHOTRIPSY    . tear duct plugs    . TOTAL KNEE ARTHROPLASTY  2011    Workman's Comp case, Dr Veverly Fells       Family History  Problem Relation Age of Onset  . Heart attack Father 24  . Colon polyps Mother   . Dementia Mother        died post SDH  . Cancer Paternal Grandfather        throat , pipe smoker  . Colon polyps Sister   . Cancer Sister        Colon, stage 2  . Hearing loss Brother   . Stroke Neg Hx     Social History   Tobacco Use  . Smoking status: Never Smoker  . Smokeless tobacco: Never Used  Vaping Use  . Vaping Use: Never used  Substance Use Topics  . Alcohol use: Yes    Comment: none in 3 months, prior 4 to 5 times per week  with heavy use  . Drug use: No    Home Medications Prior to Admission medications   Medication Sig Start Date End Date Taking? Authorizing Provider  acetaminophen (TYLENOL) 500 MG tablet Take 500 mg by mouth every 6 (six) hours as needed for moderate pain.    [provider]  cycloSPORINE (RESTASIS) 0.05 % ophthalmic emulsion Place 1 drop into both eyes daily.    [provider]  fluticasone (FLONASE) 50 MCG/ACT nasal spray Place 1 spray into both nostrils daily as needed for allergies or rhinitis.    [provider]  folic acid (FOLVITE) 1 MG tablet Take 1 tablet (1 mg total) by mouth daily. 06/20/20   [name], MD  furosemide (LASIX) 40 MG tablet Take 1 tablet (40 mg total) by mouth daily. 07/25/20   [name], MD  lactulose (CHRONULAC) 10 GM/15ML solution Take 15 mLs (10 g total) by mouth 2 (two) times daily. Patient taking differently: Take 30 g by mouth 2 (two) times daily. 04/26/20   Shelly Coss, MD  levocetirizine (XYZAL) 5 MG tablet Take 1 tablet (5 mg total) by mouth every evening. 06/20/20   [name], MD  metoprolol succinate (TOPROL-XL) 25 MG 24 hr tablet TAKE 1 TABLET(25 MG) BY MOUTH DAILY Patient taking differently: Take 12.5 mg by mouth daily. 03/27/20   [name], MD  pantoprazole (PROTONIX) 40 MG tablet Take 1 tablet (40  mg total) by mouth 2 (two) times daily. Patient taking differently: Take 40 mg by mouth daily. 06/20/20   [name], MD  potassium chloride SA (KLOR-CON) 20 MEQ tablet Take 1 tablet (20 mEq total) by mouth daily. 04/26/20   Shelly Coss, MD  spironolactone (ALDACTONE) 50 MG tablet Take 1 tablet (50 mg total) by mouth daily. Patient taking differently: Take 50 mg by mouth 2 (two) times daily. 04/26/20   Shelly Coss, MD  sucralfate (CARAFATE) 1 g tablet Take 1 tablet (1 g total) by mouth 4 (four) times daily -  with meals  and at bedtime. 07/03/20   [name], MD  thiamine 100 MG tablet Take 1 tablet (100 mg total) by mouth daily. 04/26/20   Shelly Coss, MD    Allergies    Penicillins  Review of Systems   Review of Systems  All other systems reviewed and are negative.   Physical Exam Updated Vital Signs BP (!) 165/66   Pulse (!) 116   Temp 98.1 F (36.7 C) (Oral)   Resp 18   SpO2 97%   Physical Exam Vitals and nursing note reviewed.  Constitutional:      Appearance: Normal appearance.  HENT:     Head: Normocephalic and atraumatic.     Right Ear: External ear normal.     Left Ear: External ear normal.     Nose: Nose normal.     Mouth/Throat:     Mouth: Mucous membranes are moist.     Pharynx: Oropharynx is clear.  Eyes:     Extraocular Movements: Extraocular movements intact.     Conjunctiva/sclera: Conjunctivae normal.     Pupils: Pupils are equal, round, and reactive to light.  Cardiovascular:     Rate and Rhythm: Normal rate and regular rhythm.     Pulses: Normal pulses.     Heart sounds: Normal heart sounds.  Pulmonary:     Effort: Pulmonary effort is normal.     Breath sounds: Normal breath sounds.  Abdominal:     General: Abdomen is flat. Bowel sounds are normal.     Palpations: Abdomen is soft.  Musculoskeletal:        General: Normal range of motion.     Cervical back: Normal range of motion and neck supple.  Skin:    General:  Skin is warm.     Capillary Refill: Capillary refill takes less than 2 seconds.  Neurological:     General: No focal deficit present.     Mental Status: He is alert and oriented to person, place, and time.  Psychiatric:        Mood and Affect: Mood normal.        Speech: Speech normal.        Behavior: Behavior normal.        Thought Content: Thought content normal.        Cognition and Memory: Cognition and memory normal.     ED Results / Procedures / Treatments   Labs (all labs ordered are listed, but only abnormal results are displayed) Labs Reviewed  COMPREHENSIVE METABOLIC PANEL - Abnormal; Notable for the following components:      Result Value   Sodium 133 (*)    Glucose, Bld 124 (*)    BUN 32 (*)    Albumin 3.2 (*)    AST 53 (*)    Alkaline Phosphatase 130 (*)    Total Bilirubin 4.0 (*)    All other components within normal limits  CBC WITH DIFFERENTIAL/PLATELET - Abnormal; Notable for the following components:   RBC 3.06 (*)    Hemoglobin 9.8 (*)    HCT 30.2 (*)    RDW 17.2 (*)    Platelets 105 (*)    Lymphs Abs 0.6 (*)    All other components within normal limits  AMMONIA  URINALYSIS, ROUTINE W REFLEX MICROSCOPIC    EKG None  Radiology No results found.  Procedures Procedures   Medications Ordered in ED Medications - No data to display  ED Course  I have reviewed the triage vital signs and  the nursing notes.  Pertinent labs & imaging results that were available during my care of the patient were reviewed by me and considered in my medical decision making (see chart for details).    MDM Rules/Calculators/A&P                          Pt seems very irritated with his wife so it seems that there are some family issues going on.  At this point, there is no reason to involuntarily commit him.  His ammonia level is normal.  He is alert and oriented with me.  He is stable for d/c.  Return if worse.  Final Clinical Impression(s) / ED Diagnoses Final  diagnoses:  Chronic liver failure without hepatic coma ([place])  Thrombocytopenia ([place])    Rx / DC Orders ED Discharge Orders    None       Isla Pence, MD 07/30/20 [phone]

## 2020-07-31 ENCOUNTER — Encounter (HOSPITAL_COMMUNITY): Payer: Self-pay

## 2020-07-31 ENCOUNTER — Inpatient Hospital Stay (HOSPITAL_COMMUNITY)
Admission: EM | Admit: 2020-07-31 | Discharge: 2020-08-03 | DRG: 432 | Disposition: A | Discharge status: Home / Self Care | Payer: PPO | Attending: Internal Medicine | Admitting: Internal Medicine

## 2020-07-31 ENCOUNTER — Ambulatory Visit: Payer: PPO

## 2020-07-31 ENCOUNTER — Emergency Department (HOSPITAL_COMMUNITY): Payer: PPO

## 2020-07-31 ENCOUNTER — Emergency Department (HOSPITAL_COMMUNITY)
Admission: EM | Admit: 2020-07-31 | Discharge: 2020-07-31 | Disposition: A | Discharge status: Home / Self Care | Payer: PPO | Source: Home / Self Care

## 2020-07-31 ENCOUNTER — Inpatient Hospital Stay (HOSPITAL_COMMUNITY): Payer: PPO

## 2020-07-31 DIAGNOSIS — R000 Tachycardia, unspecified: Secondary | ICD-10-CM | POA: Diagnosis not present

## 2020-07-31 DIAGNOSIS — Z9114 Patient's other noncompliance with medication regimen: Secondary | ICD-10-CM | POA: Diagnosis not present

## 2020-07-31 DIAGNOSIS — N179 Acute kidney failure, unspecified: Secondary | ICD-10-CM | POA: Diagnosis present

## 2020-07-31 DIAGNOSIS — Z8371 Family history of colonic polyps: Secondary | ICD-10-CM | POA: Diagnosis not present

## 2020-07-31 DIAGNOSIS — F05 Delirium due to known physiological condition: Secondary | ICD-10-CM | POA: Diagnosis present

## 2020-07-31 DIAGNOSIS — R443 Hallucinations, unspecified: Secondary | ICD-10-CM | POA: Diagnosis present

## 2020-07-31 DIAGNOSIS — Z809 Family history of malignant neoplasm, unspecified: Secondary | ICD-10-CM | POA: Diagnosis not present

## 2020-07-31 DIAGNOSIS — Z781 Physical restraint status: Secondary | ICD-10-CM | POA: Diagnosis not present

## 2020-07-31 DIAGNOSIS — E860 Dehydration: Secondary | ICD-10-CM | POA: Diagnosis present

## 2020-07-31 DIAGNOSIS — Z880 Allergy status to penicillin: Secondary | ICD-10-CM

## 2020-07-31 DIAGNOSIS — I480 Paroxysmal atrial fibrillation: Secondary | ICD-10-CM | POA: Diagnosis present

## 2020-07-31 DIAGNOSIS — K7200 Acute and subacute hepatic failure without coma: Secondary | ICD-10-CM | POA: Diagnosis not present

## 2020-07-31 DIAGNOSIS — K766 Portal hypertension: Secondary | ICD-10-CM | POA: Diagnosis present

## 2020-07-31 DIAGNOSIS — R569 Unspecified convulsions: Secondary | ICD-10-CM | POA: Diagnosis not present

## 2020-07-31 DIAGNOSIS — F070 Personality change due to known physiological condition: Secondary | ICD-10-CM | POA: Insufficient documentation

## 2020-07-31 DIAGNOSIS — K7290 Hepatic failure, unspecified without coma: Secondary | ICD-10-CM | POA: Diagnosis present

## 2020-07-31 DIAGNOSIS — Z20822 Contact with and (suspected) exposure to covid-19: Secondary | ICD-10-CM | POA: Diagnosis present

## 2020-07-31 DIAGNOSIS — Z96659 Presence of unspecified artificial knee joint: Secondary | ICD-10-CM | POA: Diagnosis present

## 2020-07-31 DIAGNOSIS — G4730 Sleep apnea, unspecified: Secondary | ICD-10-CM | POA: Diagnosis present

## 2020-07-31 DIAGNOSIS — K219 Gastro-esophageal reflux disease without esophagitis: Secondary | ICD-10-CM | POA: Diagnosis present

## 2020-07-31 DIAGNOSIS — K7030 Alcoholic cirrhosis of liver without ascites: Secondary | ICD-10-CM | POA: Diagnosis not present

## 2020-07-31 DIAGNOSIS — D638 Anemia in other chronic diseases classified elsewhere: Secondary | ICD-10-CM | POA: Diagnosis present

## 2020-07-31 DIAGNOSIS — G9341 Metabolic encephalopathy: Secondary | ICD-10-CM | POA: Diagnosis present

## 2020-07-31 DIAGNOSIS — Z5321 Procedure and treatment not carried out due to patient leaving prior to being seen by health care provider: Secondary | ICD-10-CM | POA: Insufficient documentation

## 2020-07-31 DIAGNOSIS — Z79899 Other long term (current) drug therapy: Secondary | ICD-10-CM | POA: Diagnosis not present

## 2020-07-31 DIAGNOSIS — F22 Delusional disorders: Secondary | ICD-10-CM | POA: Diagnosis not present

## 2020-07-31 DIAGNOSIS — Z046 Encounter for general psychiatric examination, requested by authority: Secondary | ICD-10-CM | POA: Insufficient documentation

## 2020-07-31 DIAGNOSIS — Z8719 Personal history of other diseases of the digestive system: Secondary | ICD-10-CM | POA: Diagnosis not present

## 2020-07-31 DIAGNOSIS — Z8249 Family history of ischemic heart disease and other diseases of the circulatory system: Secondary | ICD-10-CM | POA: Diagnosis not present

## 2020-07-31 DIAGNOSIS — G9389 Other specified disorders of brain: Secondary | ICD-10-CM | POA: Diagnosis not present

## 2020-07-31 DIAGNOSIS — K7040 Alcoholic hepatic failure without coma: Principal | ICD-10-CM | POA: Diagnosis present

## 2020-07-31 DIAGNOSIS — Z8711 Personal history of peptic ulcer disease: Secondary | ICD-10-CM

## 2020-07-31 DIAGNOSIS — I10 Essential (primary) hypertension: Secondary | ICD-10-CM | POA: Diagnosis present

## 2020-07-31 DIAGNOSIS — F1021 Alcohol dependence, in remission: Secondary | ICD-10-CM | POA: Diagnosis present

## 2020-07-31 DIAGNOSIS — Z87442 Personal history of urinary calculi: Secondary | ICD-10-CM

## 2020-07-31 DIAGNOSIS — K7682 Hepatic encephalopathy: Secondary | ICD-10-CM | POA: Insufficient documentation

## 2020-07-31 DIAGNOSIS — E790 Hyperuricemia without signs of inflammatory arthritis and tophaceous disease: Secondary | ICD-10-CM | POA: Diagnosis present

## 2020-07-31 DIAGNOSIS — M5136 Other intervertebral disc degeneration, lumbar region: Secondary | ICD-10-CM | POA: Diagnosis present

## 2020-07-31 DIAGNOSIS — M25561 Pain in right knee: Secondary | ICD-10-CM | POA: Diagnosis not present

## 2020-07-31 DIAGNOSIS — E872 Acidosis: Secondary | ICD-10-CM | POA: Diagnosis present

## 2020-07-31 DIAGNOSIS — M1711 Unilateral primary osteoarthritis, right knee: Secondary | ICD-10-CM | POA: Diagnosis present

## 2020-07-31 DIAGNOSIS — K709 Alcoholic liver disease, unspecified: Secondary | ICD-10-CM | POA: Diagnosis present

## 2020-07-31 DIAGNOSIS — I8510 Secondary esophageal varices without bleeding: Secondary | ICD-10-CM | POA: Diagnosis present

## 2020-07-31 DIAGNOSIS — G8929 Other chronic pain: Secondary | ICD-10-CM | POA: Diagnosis present

## 2020-07-31 DIAGNOSIS — I6782 Cerebral ischemia: Secondary | ICD-10-CM | POA: Diagnosis not present

## 2020-07-31 DIAGNOSIS — I4891 Unspecified atrial fibrillation: Secondary | ICD-10-CM | POA: Diagnosis present

## 2020-07-31 LAB — COMPREHENSIVE METABOLIC PANEL
ALT: 27 U/L (ref 0–44)
AST: 54 U/L — ABNORMAL HIGH (ref 15–41)
Albumin: 3.2 g/dL — ABNORMAL LOW (ref 3.5–5.0)
Alkaline Phosphatase: 118 U/L (ref 38–126)
Anion gap: 11 (ref 5–15)
BUN: 36 mg/dL — ABNORMAL HIGH (ref 8–23)
CO2: 22 mmol/L (ref 22–32)
Calcium: 9.4 mg/dL (ref 8.9–10.3)
Chloride: 98 mmol/L (ref 98–111)
Creatinine, Ser: 1.2 mg/dL (ref 0.61–1.24)
GFR, Estimated: >60 mL/min (ref >60)
Glucose, Bld: 161 mg/dL — ABNORMAL HIGH (ref 70–99)
Potassium: 4.7 mmol/L (ref 3.5–5.1)
Sodium: 131 mmol/L — ABNORMAL LOW (ref 135–145)
Total Bilirubin: 5.1 mg/dL — ABNORMAL HIGH (ref 0.3–1.2)
Total Protein: 6.3 g/dL — ABNORMAL LOW (ref 6.5–8.1)

## 2020-07-31 LAB — CBC WITH DIFFERENTIAL/PLATELET
Abs Immature Granulocytes: 0.09 10*3/uL — ABNORMAL HIGH (ref 0.00–0.07)
Basophils Absolute: 0 10*3/uL (ref 0.0–0.1)
Basophils Relative: 0 %
Eosinophils Absolute: 0 10*3/uL (ref 0.0–0.5)
Eosinophils Relative: 0 %
HCT: 29.6 % — ABNORMAL LOW (ref 39.0–52.0)
Hemoglobin: 9.4 g/dL — ABNORMAL LOW (ref 13.0–17.0)
Immature Granulocytes: 1 %
Lymphocytes Relative: 11 %
Lymphs Abs: 0.8 10*3/uL (ref 0.7–4.0)
MCH: 31.6 pg (ref 26.0–34.0)
MCHC: 31.8 g/dL (ref 30.0–36.0)
MCV: 99.7 fL (ref 80.0–100.0)
Monocytes Absolute: 0.7 10*3/uL (ref 0.1–1.0)
Monocytes Relative: 10 %
Neutro Abs: 5.4 10*3/uL (ref 1.7–7.7)
Neutrophils Relative %: 78 %
Platelets: 123 10*3/uL — ABNORMAL LOW (ref 150–400)
RBC: 2.97 MIL/uL — ABNORMAL LOW (ref 4.22–5.81)
RDW: 17.4 % — ABNORMAL HIGH (ref 11.5–15.5)
WBC: 7 10*3/uL (ref 4.0–10.5)
nRBC: 0 % (ref 0.0–0.2)

## 2020-07-31 LAB — LACTIC ACID, PLASMA
Lactic Acid, Venous: 7.7 mmol/L (ref 0.5–1.9)
Lactic Acid, Venous: 2.8 mmol/L (ref 0.5–1.9)

## 2020-07-31 LAB — CBG MONITORING, ED: Glucose-Capillary: 126 mg/dL — ABNORMAL HIGH (ref 70–99)

## 2020-07-31 LAB — POC OCCULT BLOOD, ED: Fecal Occult Bld: NEGATIVE

## 2020-07-31 LAB — AMMONIA: Ammonia: 171 umol/L — ABNORMAL HIGH (ref 9–35)

## 2020-07-31 LAB — ETHANOL: Alcohol, Ethyl (B): <10 mg/dL (ref <10)

## 2020-07-31 LAB — RESP PANEL BY RT-PCR (FLU A&B, COVID) ARPGX2
Influenza A by PCR: NEGATIVE
Influenza B by PCR: NEGATIVE
SARS Coronavirus 2 by RT PCR: NEGATIVE

## 2020-07-31 MED ORDER — FOLIC ACID 1 MG PO TABS
1.0000 mg | ORAL_TABLET | Freq: Every day | ORAL | Status: DC
  Administered 2020-08-01 – 2020-08-03 (×2): 1 mg via ORAL
  Filled 2020-07-31 (×3): qty 1

## 2020-07-31 MED ORDER — LORATADINE 10 MG PO TABS: 10.0000 mg | ORAL_TABLET | Freq: Every evening | ORAL | Status: DC

## 2020-07-31 MED ORDER — METOPROLOL SUCCINATE ER 25 MG PO TB24
12.5000 mg | ORAL_TABLET | Freq: Every day | ORAL | Status: DC
  Administered 2020-08-01 – 2020-08-03 (×3): 12.5 mg via ORAL
  Filled 2020-07-31: qty 0.5
  Filled 2020-07-31 (×2): qty 1

## 2020-07-31 MED ORDER — PANTOPRAZOLE SODIUM 40 MG PO TBEC
40.0000 mg | DELAYED_RELEASE_TABLET | Freq: Every day | ORAL | Status: DC
  Administered 2020-08-01 – 2020-08-03 (×3): 40 mg via ORAL
  Filled 2020-07-31 (×3): qty 1

## 2020-07-31 MED ORDER — POTASSIUM CHLORIDE CRYS ER 20 MEQ PO TBCR
20.0000 meq | EXTENDED_RELEASE_TABLET | Freq: Every day | ORAL | Status: DC
  Administered 2020-08-01 – 2020-08-02 (×2): 20 meq via ORAL
  Filled 2020-07-31 (×2): qty 1

## 2020-07-31 MED ORDER — THIAMINE HCL 100 MG PO TABS
100.0000 mg | ORAL_TABLET | Freq: Every day | ORAL | Status: DC
  Administered 2020-08-01 – 2020-08-03 (×2): 100 mg via ORAL
  Filled 2020-07-31 (×3): qty 1

## 2020-07-31 MED ORDER — SODIUM CHLORIDE 0.9 % IV BOLUS
1000.0000 mL | Freq: Once | INTRAVENOUS | Status: AC
  Administered 2020-07-31: 1000 mL via INTRAVENOUS

## 2020-07-31 MED ORDER — LACTULOSE 10 GM/15ML PO SOLN
45.0000 g | Freq: Two times a day (BID) | ORAL | Status: DC
  Administered 2020-07-31 – 2020-08-03 (×4): 45 g via ORAL
  Filled 2020-07-31 (×4): qty 90

## 2020-07-31 MED ORDER — SUCRALFATE 1 G PO TABS
1.0000 g | ORAL_TABLET | Freq: Three times a day (TID) | ORAL | Status: DC
  Administered 2020-08-01 – 2020-08-02 (×5): 1 g via ORAL
  Filled 2020-07-31 (×7): qty 1

## 2020-07-31 MED ORDER — LEVETIRACETAM IN NACL 500 MG/100ML IV SOLN
500.0000 mg | Freq: Two times a day (BID) | INTRAVENOUS | Status: DC
  Administered 2020-07-31 – 2020-08-02 (×4): 500 mg via INTRAVENOUS
  Filled 2020-07-31 (×6): qty 100

## 2020-07-31 MED ORDER — ONDANSETRON HCL 4 MG/2ML IJ SOLN: 4.0000 mg | Freq: Four times a day (QID) | INTRAMUSCULAR | Status: DC | PRN

## 2020-07-31 MED ORDER — FLUTICASONE PROPIONATE 50 MCG/ACT NA SUSP
1.0000 | Freq: Every day | NASAL | Status: DC | PRN
  Filled 2020-07-31: qty 16

## 2020-07-31 MED ORDER — LORAZEPAM 2 MG/ML IJ SOLN
1.0000 mg | INTRAMUSCULAR | Status: DC | PRN
  Administered 2020-08-01: 1 mg via INTRAVENOUS
  Filled 2020-07-31 (×2): qty 1

## 2020-07-31 MED ORDER — ONDANSETRON HCL 4 MG PO TABS: 4.0000 mg | ORAL_TABLET | Freq: Four times a day (QID) | ORAL | Status: DC | PRN

## 2020-07-31 MED ORDER — ZINC GLUCONATE 50 MG PO TABS
50.0000 mg | ORAL_TABLET | Freq: Every day | ORAL | Status: DC
Start: 2020-08-01 — End: 2020-07-31

## 2020-07-31 MED ORDER — LACTULOSE 10 GM/15ML PO SOLN
30.0000 g | Freq: Once | ORAL | Status: AC
  Administered 2020-07-31: 30 g via ORAL
  Filled 2020-07-31: qty 60

## 2020-07-31 MED ORDER — LEVOCETIRIZINE DIHYDROCHLORIDE 5 MG PO TABS: 5.0000 mg | ORAL_TABLET | Freq: Every evening | ORAL | Status: DC

## 2020-07-31 MED ORDER — CENTRUM PO CHEW
CHEWABLE_TABLET | Freq: Every day | ORAL | Status: DC
  Administered 2020-08-01: 1 via ORAL
  Filled 2020-07-31 (×3): qty 1

## 2020-07-31 MED ORDER — FUROSEMIDE 40 MG PO TABS
40.0000 mg | ORAL_TABLET | Freq: Every day | ORAL | Status: DC
  Administered 2020-08-01 – 2020-08-03 (×2): 40 mg via ORAL
  Filled 2020-07-31 (×3): qty 1

## 2020-07-31 MED ORDER — LORAZEPAM 2 MG/ML IJ SOLN
INTRAMUSCULAR | Status: AC
  Administered 2020-07-31: 1 mg
  Filled 2020-07-31: qty 1

## 2020-07-31 MED ORDER — LORAZEPAM 2 MG/ML IJ SOLN: 2.0000 mg | Freq: Once | INTRAMUSCULAR | Status: AC

## 2020-07-31 MED ORDER — RIFAXIMIN 550 MG PO TABS
550.0000 mg | ORAL_TABLET | Freq: Two times a day (BID) | ORAL | Status: DC
  Administered 2020-07-31 – 2020-08-03 (×5): 550 mg via ORAL
  Filled 2020-07-31 (×6): qty 1

## 2020-07-31 MED ORDER — LORATADINE 10 MG PO TABS
10.0000 mg | ORAL_TABLET | Freq: Every evening | ORAL | Status: DC
  Administered 2020-07-31 – 2020-08-01 (×2): 10 mg via ORAL
  Filled 2020-07-31 (×2): qty 1

## 2020-07-31 MED ORDER — CYCLOSPORINE 0.05 % OP EMUL
1.0000 [drp] | Freq: Every day | OPHTHALMIC | Status: DC
  Administered 2020-08-01 – 2020-08-03 (×3): 1 [drp] via OPHTHALMIC
  Filled 2020-07-31 (×3): qty 1

## 2020-07-31 MED ORDER — SPIRONOLACTONE 25 MG PO TABS
75.0000 mg | ORAL_TABLET | Freq: Every day | ORAL | Status: DC
  Administered 2020-08-01 – 2020-08-03 (×3): 75 mg via ORAL
  Filled 2020-07-31 (×3): qty 3

## 2020-07-31 MED ORDER — ACETAMINOPHEN 500 MG PO TABS
500.0000 mg | ORAL_TABLET | Freq: Four times a day (QID) | ORAL | Status: DC | PRN
  Administered 2020-08-01 – 2020-08-03 (×2): 500 mg via ORAL
  Filled 2020-07-31 (×2): qty 1

## 2020-07-31 MED ORDER — LORAZEPAM 2 MG/ML IJ SOLN
INTRAMUSCULAR | Status: AC
  Administered 2020-07-31: 2 mg via INTRAVENOUS
  Filled 2020-07-31: qty 1

## 2020-07-31 NOTE — ED Notes (Signed)
Date and time results received: 07/31/20 1811 (use smartphrase ".now" to insert current time)  Test: Lactic Critical Value: 7.7  Name of Provider Notified: Deno Etienne PA  Orders Received? Or Actions Taken?: Orders Received - See Orders for details
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
## 2020-07-31 NOTE — Progress Notes (Signed)
Failed attempt at MRI. Patient refused to lower his head enough to be positioned with imaging equipment. He soon after, refused the MRI together. Spoke with RN.

## 2020-07-31 NOTE — ED Notes (Signed)
Patient placed on bedpan after calling out that he needs to have a bowel movement.

## 2020-07-31 NOTE — ED Notes (Signed)
Pt's family member came to the ER and wants pt to be seen. Explained to family that pt does not want to stay and we cannot hold him against his will if he doesn't want to be seen. Pt remains adamant that he is not coming back to his room. Explained to family that the only way we can keep him is if he is under IVC papers. Family understands and is agreeable to this plan.

## 2020-07-31 NOTE — ED Notes (Signed)
Pt walked out saying he was going to wait on a limo. PA Gertie Fey and RN lynnssey have been made aware of pt leaving the department.

## 2020-07-31 NOTE — ED Notes (Signed)
Patient saying he is refusing to take any medications until he sees a doctor. Garba MD just was in the room. Patient says he does not know what is in the medication so he must see the doctor.
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
## 2020-07-31 NOTE — ED Notes (Signed)
Patient belongings include gray jacket, underwear, black pants, $5 and $03, birth certificate card and social security card in pants pocket all in patient belongings bag.
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
## 2020-07-31 NOTE — ED Notes (Signed)
Medical staff alerted by [facility] officer staying with pt that pt almost fell, seizing at time of entry. No head strike. Terence Lux at bedside. Pt placed on o2, IV established, ativan given.

## 2020-07-31 NOTE — ED Notes (Signed)
RN wrapped patients IV with coban, patient said he would "punch me" if I touched him.

## 2020-07-31 NOTE — H&P (Signed)
History and Physical   Timothy Mckinney [id number] DOB: 1951/01/28 DOA: 07/31/2020  Referring MD/NP/PA: Dr. Eulis Foster  PCP: [name], MD   Patient coming from: Home  Chief Complaint: Altered mental status  HPI: Timothy Mckinney is a 70 y.o. male with medical history significant of alcoholic liver disease with cirrhosis, A. fib, degenerative disc disease, essential hypertension, hyperuricemia, nephrolithiasis who was brought to the emergency department due to altered mental status agitation.  IVC was taken out by the wife.  She was worried that he has been agitated and is a danger to himself.  He has been hostile towards family members with also complained of right knee pain where he was found to have tricompartmental osteoarthritis.  While in the ER patient is currently restrained.  Was able to communicate with me after a while.  He has been talking negatively about his wife.  Accusing his wife of all kinds of things.  He was noted to have elevated ammonia and diagnosed with hepatic encephalopathy.  He has a number of hallucinations.  While in the ER patient noted to have evidence of seizure.  He had 1 episode.  Neurology consulted.  Patient has not been on alcohol since he was diagnosed with liver disease.  No prior history of seizures.  No evidence of intracranial disease but no MRI currently available.  He is therefore being admitted with hepatic encephalopathy and new onset seizure..  ED Course: Temperature is 97.5 blood pressure 172/67 pulse 122 respirate 28 oxygen sat 96% room air.  White count 7.1 hemoglobin 9.4 was platelets 123.  Sodium is 131 potassium 4.7 chloride 98 CO2 22 BUN 36 creatinine 1.20 calcium 9.4.  Lactic acid 7.7.  Fecal occult blood testing is negative.  COVID-19 screen is negative.  Ammonia level 171.  Patient initiated on lactulose rifaximin and Keppra for seizure.  Review of Systems: As per HPI otherwise 10 point review of systems negative.    Past Medical  History:  Diagnosis Date  . Abnormal chest x-ray    ? granulomata  . Arthritis    knees and back   . DDD (degenerative disc disease), lumbar    MRI 08/2005  . Dry eyes, bilateral    Dr Bing Plume  . Elevated liver enzymes    HISTORY OF ELEVATED LIVER ENZYMES  . GERD (gastroesophageal reflux disease)    hx of   . Hypertension    NEW DIAGNOSIS AND STARTED ON METOPROLOL ON 10/04/11  . Hyperuricemia   . 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 Nephrolithiasis    Dr Serita Butcher  . Sleep apnea    STOP BANG SCORE OF 4  . Thrombocytopenia ([place])    HX OF LOW PLATELET COUNT    Past Surgical History:  Procedure Laterality Date  . ANAL FISTULECTOMY  06/01/2012   Procedure: FISTULECTOMY ANAL;  Surgeon: Leighton Ruff, MD;  Location: [facility];  Service: General;  Laterality: N/A;  Mucosal Advancement Flap   . BIOPSY  04/21/2020   Procedure: BIOPSY;  Surgeon: Carol Ada, MD;  Location: [facility];  Service: Endoscopy;;  . CARDIAC CATHETERIZATION     in 40s, negative  . colon ulcer  2011  . COLONOSCOPY W/ POLYPECTOMY  2011   Dr Sharlett Iles  . ESOPHAGOGASTRODUODENOSCOPY (EGD) WITH PROPOFOL N/A 04/21/2020   Procedure: ESOPHAGOGASTRODUODENOSCOPY (EGD) WITH PROPOFOL;  Surgeon: Carol Ada, MD;  Location: [facility];  Service: Endoscopy;  Laterality: N/A;  . ESOPHAGOGASTRODUODENOSCOPY (EGD) WITH PROPOFOL N/A 06/09/2020   Procedure: ESOPHAGOGASTRODUODENOSCOPY (EGD) WITH  PROPOFOL;  Surgeon: Carol Ada, MD;  Location: [facility];  Service: Endoscopy;  Laterality: N/A;  . HOT HEMOSTASIS N/A 06/09/2020   Procedure: HOT HEMOSTASIS (ARGON PLASMA COAGULATION/BICAP);  Surgeon: Carol Ada, MD;  Location: [facility];  Service: Endoscopy;  Laterality: N/A;  . KNEE ARTHROSCOPY  2009    L  . KNEE SURGERY     april 2013 total knee replacement removed  . LITHOTRIPSY    . tear duct plugs    . TOTAL KNEE ARTHROPLASTY  2011   Workman's Comp case, Dr Veverly Fells     reports that he has never smoked.  He has never used smokeless tobacco. He reports current alcohol use. He reports that he does not use drugs.  Allergies  Allergen Reactions  . Penicillins Swelling    Diffuse swelling    Family History  Problem Relation Age of Onset  . Heart attack Father 47  . Colon polyps Mother   . Dementia Mother        died post SDH  . Cancer Paternal Grandfather        throat , pipe smoker  . Colon polyps Sister   . Cancer Sister        Colon, stage 2  . Hearing loss Brother   . Stroke Neg Hx      Prior to Admission medications   Medication Sig Start Date End Date Taking? Authorizing Provider  acetaminophen (TYLENOL) 500 MG tablet Take 500 mg by mouth every 6 (six) hours as needed for moderate pain.   Yes [provider]  cycloSPORINE (RESTASIS) 0.05 % ophthalmic emulsion Place 1 drop into both eyes daily.   Yes [provider]  fluticasone (FLONASE) 50 MCG/ACT nasal spray Place 1 spray into both nostrils daily as needed for allergies or rhinitis.   Yes [provider]  folic acid (FOLVITE) 1 MG tablet Take 1 tablet (1 mg total) by mouth daily. 06/20/20  Yes [name], MD  furosemide (LASIX) 40 MG tablet Take 1 tablet (40 mg total) by mouth daily. 07/25/20  Yes [name], MD  lactulose (CHRONULAC) 10 GM/15ML solution Take 15 mLs (10 g total) by mouth 2 (two) times daily. Patient taking differently: Take 30 g by mouth 2 (two) times daily. 04/26/20  Yes Shelly Coss, MD  levocetirizine (XYZAL) 5 MG tablet Take 1 tablet (5 mg total) by mouth every evening. 06/20/20  Yes [name], MD  metoprolol succinate (TOPROL-XL) 25 MG 24 hr tablet TAKE 1 TABLET(25 MG) BY MOUTH DAILY Patient taking differently: Take 12.5 mg by mouth daily. 03/27/20  Yes [name], MD  Multiple Vitamins-Minerals (MULTIVITAMIN ADULT EXTRA C PO) Take 1 tablet by mouth daily.   Yes [provider]  pantoprazole (PROTONIX) 40 MG tablet Take 1 tablet (40  mg total) by mouth 2 (two) times daily. Patient taking differently: Take 40 mg by mouth daily. 06/20/20  Yes [name], MD  potassium chloride SA (KLOR-CON) 20 MEQ tablet Take 1 tablet (20 mEq total) by mouth daily. 04/26/20  Yes Shelly Coss, MD  spironolactone (ALDACTONE) 50 MG tablet Take 1 tablet (50 mg total) by mouth daily. Patient taking differently: Take 75 mg by mouth daily. 04/26/20  Yes Shelly Coss, MD  sucralfate (CARAFATE) 1 g tablet Take 1 tablet (1 g total) by mouth 4 (four) times daily -  with meals and at bedtime. 07/03/20  Yes [name], MD  thiamine 100 MG tablet Take 1 tablet (100  mg total) by mouth daily. 04/26/20  Yes Shelly Coss, MD  XIFAXAN 550 MG TABS tablet Take 550 mg by mouth 2 (two) times daily. 07/31/20  Yes [provider]  zinc gluconate 50 MG tablet Take 50 mg by mouth daily.   Yes [provider]    Physical Exam: Vitals:   07/31/20 2115 07/31/20 2130 07/31/20 2145 07/31/20 2200  BP: 137/66 (!) 135/52 (!) 121/52 (!) 131/51  Pulse: (!) 122 71 71 72  Resp: 18 17 16 16   Temp:      TempSrc:      SpO2: 99% 99% 98% 98%      Constitutional: Acutely ill looking, agitated, mild distress Vitals:   07/31/20 2115 07/31/20 2130 07/31/20 2145 07/31/20 2200  BP: 137/66 (!) 135/52 (!) 121/52 (!) 131/51  Pulse: (!) 122 71 71 72  Resp: 18 17 16 16   Temp:      TempSrc:      SpO2: 99% 99% 98% 98%   Eyes: PERRL, lids and conjunctivae normal ENMT: Mucous membranes are moist. Posterior pharynx clear of any exudate or lesions.Normal dentition.  Neck: normal, supple, no masses, no thyromegaly Respiratory: clear to auscultation bilaterally, no wheezing, no crackles. Normal respiratory effort. No accessory muscle use.  Cardiovascular: Sinus tachycardia, no murmurs / rubs / gallops.  1+ extremity edema. 2+ pedal pulses. No carotid bruits.  Abdomen: Distended, positive ascites,, no masses palpated. No hepatosplenomegaly. Bowel  sounds positive.  Musculoskeletal: no clubbing / cyanosis. No joint deformity upper and lower extremities. Good ROM, no contractures. Normal muscle tone.  Skin: no rashes, lesions, ulcers. No induration Neurologic: CN 2-12 grossly intact. Sensation intact, DTR normal. Strength 5/5 in all 4.  Psychiatric: Agitated, currently restrained, positive hallucinations, no suicidal or homicidal ideation..     Labs on Admission: I have personally reviewed following labs and imaging studies  CBC: Recent Labs  Lab 07/30/20 1716 07/31/20 1545  WBC 5.1 7.0  NEUTROABS 3.9 5.4  HGB 9.8* 9.4*  HCT 30.2* 29.6*  MCV 98.7 99.7  PLT 105* [id number]*   Basic Metabolic Panel: Recent Labs  Lab 07/30/20 1716 07/31/20 1545  NA 133* 131*  K 4.5 4.7  CL 101 98  CO2 23 22  GLUCOSE 124* 161*  BUN 32* 36*  CREATININE 1.09 1.20  CALCIUM 9.3 9.4   GFR: CrCl cannot be calculated (Unknown ideal weight.). Liver Function Tests: Recent Labs  Lab 07/30/20 1716 07/31/20 1545  AST 53* 54*  ALT 27 27  ALKPHOS 130* 118  BILITOT 4.0* 5.1*  PROT 6.5 6.3*  ALBUMIN 3.2* 3.2*   No results for input(s): LIPASE, AMYLASE in the last 168 hours. Recent Labs  Lab 07/30/20 1716 07/31/20 1645  AMMONIA 11 171*   Coagulation Profile: No results for input(s): INR, PROTIME in the last 168 hours. Cardiac Enzymes: No results for input(s): CKTOTAL, CKMB, CKMBINDEX, TROPONINI in the last 168 hours. BNP (last 3 results) No results for input(s): PROBNP in the last 8760 hours. HbA1C: No results for input(s): HGBA1C in the last 72 hours. CBG: Recent Labs  Lab 07/31/20 1642  GLUCAP 126*   Lipid Profile: No results for input(s): CHOL, HDL, LDLCALC, TRIG, CHOLHDL, LDLDIRECT in the last 72 hours. Thyroid Function Tests: No results for input(s): TSH, T4TOTAL, FREET4, T3FREE, THYROIDAB in the last 72 hours. Anemia Panel: No results for input(s): VITAMINB12, FOLATE, FERRITIN, TIBC, IRON, RETICCTPCT in the last 72  hours. Urine analysis:    Component Value Date/Time   COLORURINE YELLOW 06/05/2020  [place] 06/05/2020 1315   LABSPEC 1.018 06/05/2020 1315   PHURINE 6.0 06/05/2020 1315   GLUCOSEU NEGATIVE 06/05/2020 1315   HGBUR NEGATIVE 06/05/2020 1315   BILIRUBINUR NEGATIVE 06/05/2020 1315   KETONESUR 5 (A) 06/05/2020 1315   PROTEINUR NEGATIVE 06/05/2020 1315   UROBILINOGEN 0.2 10/15/2014 1020   NITRITE NEGATIVE 06/05/2020 1315   LEUKOCYTESUR NEGATIVE 06/05/2020 1315   Sepsis Labs: @LABRCNTIP (procalcitonin:4,lacticidven:4) ) Recent Results (from the past 240 hour(s))  Resp Panel by RT-PCR (Flu A&B, Covid) Nasopharyngeal Swab     Status: None   Collection Time: 07/31/20  4:45 PM   Specimen: Nasopharyngeal Swab; Nasopharyngeal(NP) swabs in vial transport medium  Result Value Ref Range Status   SARS Coronavirus 2 by RT PCR NEGATIVE NEGATIVE Final    Comment: (NOTE) SARS-CoV-2 target nucleic acids are NOT DETECTED.  The SARS-CoV-2 RNA is generally detectable in upper respiratory specimens during the acute phase of infection. The lowest concentration of SARS-CoV-2 viral copies this assay can detect is 138 copies/mL. A negative result does not preclude SARS-Cov-2 infection and should not be used as the sole basis for treatment or other patient management decisions. A negative result may occur with  improper specimen collection/handling, submission of specimen other than nasopharyngeal swab, presence of viral mutation(s) within the areas targeted by this assay, and inadequate number of viral copies(<138 copies/mL). A negative result must be combined with clinical observations, patient history, and epidemiological information. The expected result is Negative.  Fact Sheet for Patients:  EntrepreneurPulse.com.au  Fact Sheet for Healthcare Providers:  IncredibleEmployment.be  This test is no t yet approved or cleared by the [place] FDA  and  has been authorized for detection and/or diagnosis of SARS-CoV-2 by FDA under an Emergency Use Authorization (EUA). This EUA will remain  in effect (meaning this test can be used) for the duration of the COVID-19 declaration under Section 564(b)(1) of the Act, 21 U.S.C.section 360bbb-3(b)(1), unless the authorization is terminated  or revoked sooner.       Influenza A by PCR NEGATIVE NEGATIVE Final   Influenza B by PCR NEGATIVE NEGATIVE Final    Comment: (NOTE) The Xpert Xpress SARS-CoV-2/FLU/RSV plus assay is intended as an aid in the diagnosis of influenza from Nasopharyngeal swab specimens and should not be used as a sole basis for treatment. Nasal washings and aspirates are unacceptable for Xpert Xpress SARS-CoV-2/FLU/RSV testing.  Fact Sheet for Patients: EntrepreneurPulse.com.au  Fact Sheet for Healthcare Providers: IncredibleEmployment.be  This test is not yet approved or cleared by the [place] FDA and has been authorized for detection and/or diagnosis of SARS-CoV-2 by FDA under an Emergency Use Authorization (EUA). This EUA will remain in effect (meaning this test can be used) for the duration of the COVID-19 declaration under Section 564(b)(1) of the Act, 21 U.S.C. section 360bbb-3(b)(1), unless the authorization is terminated or revoked.  Performed at [facility], [place] [address], [place], [place] [address]      Radiological Exams on Admission: DG Knee 2 Views Right  Result Date: 07/31/2020 CLINICAL DATA:  Knee pain EXAM: RIGHT KNEE - 1-2 VIEW COMPARISON:  None. FINDINGS: There are mild tricompartmental degenerative changes of the right knee. No acute displaced fracture or dislocation. No significant joint effusion. IMPRESSION: Mild tricompartmental degenerative changes of the right knee. Electronically Signed   By: Constance Holster M.D.   On: 07/31/2020 16:09      Assessment/Plan Principal  Problem:   Acute hepatic encephalopathy Active Problems:  GERD (gastroesophageal reflux disease)   Alcoholic liver disease (H[facility])   AKI (acute kidney injury) ([place])   A-fib ([place])   Seizure ([place])     #1 acute hepatic encephalopathy: Patient will be admitted.  Initiate lactulose as well as rifaximin.  Continue with other medications as well.  He will remain restrained if needed.  Already has an IVC taken out by the wife.  We will therefore have a sitter.  #2 seizure: No previous history of seizure.  One episode of seizure today.  Initiated patient on [place].  Once is, stable may need an MRI of the brain to rule out any brain lesion.  #3 AKI: Continue to hydrate and monitor  #4 GERD: We will continue PPIs.  #5 history of atrial fibrillation: Not on chronic anticoagulation.  Rate controlled.  Continue to monitor  #6 history of alcoholism: Patient apparently has quit drinking long time ago.  Continue multivitamins with thiamine and folic acid    DVT prophylaxis: SCD Code Status: Full code Family Communication: Wife Disposition Plan: To be determined Consults called: None Admission status: Inpatient  Severity of Illness: The appropriate patient status for this patient is INPATIENT. Inpatient status is judged to be reasonable and necessary in order to provide the required intensity of service to ensure the patient's safety. The patient's presenting symptoms, physical exam findings, and initial radiographic and laboratory data in the context of their chronic comorbidities is felt to place them at high risk for further clinical deterioration. Furthermore, it is not anticipated that the patient will be medically stable for discharge from the hospital within 2 midnights of admission. The following factors support the patient status of inpatient.   " The patient's presenting symptoms include altered mental status and hallucinations. " The worrisome physical exam findings include confused  hallucinating. " The initial radiographic and laboratory data are worrisome because of elevated ammonia with multiple electrolyte imbalance. " The chronic co-morbidities include alcoholic cirrhosis.   * I certify that at the point of admission it is my clinical judgment that the patient will require inpatient hospital care spanning beyond 2 midnights from the point of admission due to high intensity of service, high risk for further deterioration and high frequency of surveillance required.Barbette Merino MD [facility] Pager [phone]  If 7PM-7AM, please contact night-coverage [email or web address] Password [email or web address]  07/31/2020, 10:40 PM

## 2020-07-31 NOTE — ED Triage Notes (Signed)
Pt arrived via [facility], under IVC, per family pt with hx of cirrhosis of liver, refusing treatment, family states pt has been hostile at home, not eating/sleeping as appropriately.Pt family believes pt is acting this way due to possible infection due to medical problems.

## 2020-07-31 NOTE — ED Notes (Signed)
Patient had a bowel movement. Patient cleaned up with new linens and a primofit placed on patient.

## 2020-07-31 NOTE — ED Notes (Signed)
Patient attempted to get out of bed with restraints tied to bed because he needs to have a bowel movement again. Restraints retied to bed.

## 2020-07-31 NOTE — ED Notes (Signed)
Per request from [place], [place], attempted to call family and let them know that pt left.

## 2020-08-01 ENCOUNTER — Inpatient Hospital Stay (HOSPITAL_COMMUNITY): Payer: PPO

## 2020-08-01 DIAGNOSIS — K7200 Acute and subacute hepatic failure without coma: Secondary | ICD-10-CM | POA: Diagnosis not present

## 2020-08-01 LAB — CBC
HCT: 24.9 % — ABNORMAL LOW (ref 39.0–52.0)
Hemoglobin: 8.1 g/dL — ABNORMAL LOW (ref 13.0–17.0)
MCH: 32.5 pg (ref 26.0–34.0)
MCHC: 32.5 g/dL (ref 30.0–36.0)
MCV: 100 fL (ref 80.0–100.0)
Platelets: 66 10*3/uL — ABNORMAL LOW (ref 150–400)
RBC: 2.49 MIL/uL — ABNORMAL LOW (ref 4.22–5.81)
RDW: 17.3 % — ABNORMAL HIGH (ref 11.5–15.5)
WBC: 4.8 10*3/uL (ref 4.0–10.5)
nRBC: 0 % (ref 0.0–0.2)

## 2020-08-01 LAB — RAPID URINE DRUG SCREEN, HOSP PERFORMED
Amphetamines: NOT DETECTED
Barbiturates: NOT DETECTED
Benzodiazepines: POSITIVE — AB
Cocaine: NOT DETECTED
Opiates: NOT DETECTED
Tetrahydrocannabinol: NOT DETECTED

## 2020-08-01 LAB — COMPREHENSIVE METABOLIC PANEL
ALT: 24 U/L (ref 0–44)
AST: 48 U/L — ABNORMAL HIGH (ref 15–41)
Albumin: 2.7 g/dL — ABNORMAL LOW (ref 3.5–5.0)
Alkaline Phosphatase: 88 U/L (ref 38–126)
Anion gap: 9 (ref 5–15)
BUN: 30 mg/dL — ABNORMAL HIGH (ref 8–23)
CO2: 22 mmol/L (ref 22–32)
Calcium: 8.9 mg/dL (ref 8.9–10.3)
Chloride: 103 mmol/L (ref 98–111)
Creatinine, Ser: 1.03 mg/dL (ref 0.61–1.24)
GFR, Estimated: >60 mL/min (ref >60)
Glucose, Bld: 111 mg/dL — ABNORMAL HIGH (ref 70–99)
Potassium: 4.4 mmol/L (ref 3.5–5.1)
Sodium: 134 mmol/L — ABNORMAL LOW (ref 135–145)
Total Bilirubin: 4.5 mg/dL — ABNORMAL HIGH (ref 0.3–1.2)
Total Protein: 5.3 g/dL — ABNORMAL LOW (ref 6.5–8.1)

## 2020-08-01 LAB — AMMONIA: Ammonia: 25 umol/L (ref 9–35)

## 2020-08-01 MED ORDER — METHYLPREDNISOLONE 4 MG PO TBPK
4.0000 mg | ORAL_TABLET | ORAL | Status: AC
  Administered 2020-08-02: 4 mg via ORAL

## 2020-08-01 MED ORDER — METHYLPREDNISOLONE 4 MG PO TBPK: 4.0000 mg | ORAL_TABLET | ORAL | Status: AC

## 2020-08-01 MED ORDER — METHYLPREDNISOLONE 4 MG PO TBPK
4.0000 mg | ORAL_TABLET | Freq: Four times a day (QID) | ORAL | Status: DC
  Administered 2020-08-02: 4 mg via ORAL

## 2020-08-01 MED ORDER — METHYLPREDNISOLONE 4 MG PO TBPK
8.0000 mg | ORAL_TABLET | Freq: Every evening | ORAL | Status: AC
  Administered 2020-08-02: 8 mg via ORAL

## 2020-08-01 MED ORDER — OXYCODONE HCL 5 MG PO TABS
5.0000 mg | ORAL_TABLET | Freq: Four times a day (QID) | ORAL | Status: DC | PRN
  Administered 2020-08-01: 5 mg via ORAL
  Filled 2020-08-01: qty 1

## 2020-08-01 MED ORDER — METHYLPREDNISOLONE 4 MG PO TBPK
4.0000 mg | ORAL_TABLET | Freq: Three times a day (TID) | ORAL | Status: DC
  Administered 2020-08-03 (×2): 4 mg via ORAL

## 2020-08-01 MED ORDER — METHYLPREDNISOLONE 4 MG PO TBPK
8.0000 mg | ORAL_TABLET | Freq: Every morning | ORAL | Status: AC
  Filled 2020-08-01: qty 21

## 2020-08-01 MED ORDER — METHYLPREDNISOLONE 4 MG PO TBPK: 8.0000 mg | ORAL_TABLET | Freq: Every evening | ORAL | Status: DC

## 2020-08-01 MED ORDER — GADOBUTROL 1 MMOL/ML IV SOLN
10.0000 mL | Freq: Once | INTRAVENOUS | Status: AC | PRN
  Administered 2020-08-01: 10 mL via INTRAVENOUS

## 2020-08-01 NOTE — ED Notes (Signed)
Spoke with MRI department, plan is to get patient around 1330 for MRI.  Patient updated.

## 2020-08-01 NOTE — ED Notes (Signed)
Patient given meal tray.  All restraints removed so we could clean up patient due to bowel movement.  Patient allowed to stay out of restraints so he can feed himself, patient calm at this time.

## 2020-08-01 NOTE — ED Notes (Signed)
Report called to [facility], RN.  Per unit, patient will have to be held in the ED until they locate a sitter for him due to him being IVC.

## 2020-08-01 NOTE — ED Notes (Addendum)
Patient had another bowel movement. Patient cleaned up, new sheets applied. Patient asking for a steak. After telling patient we do not have those at the hospital at 12 in the morning, patient says "I wanna leave this place yall are treating me awful." Offered patient a sandwich, crackers, drinks.

## 2020-08-01 NOTE — ED Notes (Signed)
Helped feed patient chicken noodle soup.

## 2020-08-01 NOTE — ED Notes (Signed)
Timothy Mckinney, wife, wants update, [phone].

## 2020-08-01 NOTE — Consult Note (Signed)
Reason for Consult: Recurrent hepatic encephalopathy Referring Physician: [facility]  Timothy Mckinney HPI: This is a 70 year old male with a PMH of ETOH cirrhosis, GAVE, recurrent hepatic encephalopathy, afib, and HTN admitted for AMS secondary to hepatic encephalopathy.  The history is obtained from the chart was the patient was just transported to MRI.  The patient was recently noted to be hostile towards family members and he was restrainedin the ER.  His NH3 level was noted to be elevated and there was an episode of a seizure in the ER.  The patient has not drank any ETOH for a number of months.  Per Dr,. Mann's office note he was not interested in transplant, but he was also found to have an elevated CA19-9.  An MRI was recommended.  At home he is not able afford rifaximin, but he does take lactulose  Past Medical History:  Diagnosis Date  . Abnormal chest x-ray    ? granulomata  . Arthritis    knees and back   . DDD (degenerative disc disease), lumbar    MRI 08/2005  . Dry eyes, bilateral    Dr Bing Plume  . Elevated liver enzymes    HISTORY OF ELEVATED LIVER ENZYMES  . GERD (gastroesophageal reflux disease)    hx of   . Hypertension    NEW DIAGNOSIS AND STARTED ON METOPROLOL ON 10/04/11  . Hyperuricemia   . 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 Nephrolithiasis    Dr Serita Butcher  . Sleep apnea    STOP BANG SCORE OF 4  . Thrombocytopenia ([place])    HX OF LOW PLATELET COUNT    Past Surgical History:  Procedure Laterality Date  . ANAL FISTULECTOMY  06/01/2012   Procedure: FISTULECTOMY ANAL;  Surgeon: Leighton Ruff, MD;  Location: [facility];  Service: General;  Laterality: N/A;  Mucosal Advancement Flap   . BIOPSY  04/21/2020   Procedure: BIOPSY;  Surgeon: Carol Ada, MD;  Location: [facility];  Service: Endoscopy;;  . CARDIAC CATHETERIZATION     in 40s, negative  . colon ulcer  2011  . COLONOSCOPY W/ POLYPECTOMY  2011   Dr Sharlett Iles  . ESOPHAGOGASTRODUODENOSCOPY  (EGD) WITH PROPOFOL N/A 04/21/2020   Procedure: ESOPHAGOGASTRODUODENOSCOPY (EGD) WITH PROPOFOL;  Surgeon: Carol Ada, MD;  Location: [facility];  Service: Endoscopy;  Laterality: N/A;  . ESOPHAGOGASTRODUODENOSCOPY (EGD) WITH PROPOFOL N/A 06/09/2020   Procedure: ESOPHAGOGASTRODUODENOSCOPY (EGD) WITH PROPOFOL;  Surgeon: Carol Ada, MD;  Location: [facility];  Service: Endoscopy;  Laterality: N/A;  . HOT HEMOSTASIS N/A 06/09/2020   Procedure: HOT HEMOSTASIS (ARGON PLASMA COAGULATION/BICAP);  Surgeon: Carol Ada, MD;  Location: [facility];  Service: Endoscopy;  Laterality: N/A;  . KNEE ARTHROSCOPY  2009    L  . KNEE SURGERY     april 2013 total knee replacement removed  . LITHOTRIPSY    . tear duct plugs    . TOTAL KNEE ARTHROPLASTY  2011   Workman's Comp case, Dr Veverly Fells    Family History  Problem Relation Age of Onset  . Heart attack Father 67  . Colon polyps Mother   . Dementia Mother        died post SDH  . Cancer Paternal Grandfather        throat , pipe smoker  . Colon polyps Sister   . Cancer Sister        Colon, stage 2  . Hearing loss Brother   . Stroke Neg Hx     Social History:  reports that he has never smoked. He has never used smokeless tobacco. He reports current alcohol use. He reports that he does not use drugs.  Allergies:  Allergies  Allergen Reactions  . Penicillins Swelling    Diffuse swelling    Medications:  Scheduled: . cycloSPORINE  1 drop Both Eyes Daily  . folic acid  1 mg Oral Daily  . furosemide  40 mg Oral Daily  . lactulose  45 g Oral BID  . loratadine  10 mg Oral QPM  . [START ON 08/02/2020] methylPREDNISolone  4 mg Oral PC lunch  . [START ON 08/02/2020] methylPREDNISolone  4 mg Oral PC supper  . [START ON 08/03/2020] methylPREDNISolone  4 mg Oral 3 x daily with food  . [START ON 08/04/2020] methylPREDNISolone  4 mg Oral 4X daily taper  . [START ON 08/02/2020] methylPREDNISolone  8 mg Oral AC breakfast  . [START ON 08/02/2020]  methylPREDNISolone  8 mg Oral Nightly  . [START ON 08/03/2020] methylPREDNISolone  8 mg Oral Nightly  . metoprolol succinate  12.5 mg Oral Daily  . multivitamin-iron-minerals-folic acid   Oral Daily  . pantoprazole  40 mg Oral Daily  . potassium chloride SA  20 mEq Oral Daily  . rifaximin  550 mg Oral BID  . spironolactone  75 mg Oral Daily  . sucralfate  1 g Oral TID WC & HS  . thiamine  100 mg Oral Daily   Continuous: . levETIRAcetam Stopped (08/01/20 1132)    Results for orders placed or performed during the hospital encounter of 07/31/20 (from the past 24 hour(s))  POC occult blood, ED Provider will collect     Status: None   Collection Time: 07/31/20  6:48 PM  Result Value Ref Range   Fecal Occult Bld NEGATIVE NEGATIVE  Lactic acid, plasma     Status: Abnormal   Collection Time: 07/31/20  7:32 PM  Result Value Ref Range   Lactic Acid, Venous 2.8 (HH) 0.5 - 1.9 mmol/L  Comprehensive metabolic panel     Status: Abnormal   Collection Time: 08/01/20  3:08 AM  Result Value Ref Range   Sodium 134 (L) 135 - 145 mmol/L   Potassium 4.4 3.5 - 5.1 mmol/L   Chloride 103 98 - 111 mmol/L   CO2 22 22 - 32 mmol/L   Glucose, Bld 111 (H) 70 - 99 mg/dL   BUN 30 (H) 8 - 23 mg/dL   Creatinine, Ser 1.03 0.61 - 1.24 mg/dL   Calcium 8.9 8.9 - 10.3 mg/dL   Total Protein 5.3 (L) 6.5 - 8.1 g/dL   Albumin 2.7 (L) 3.5 - 5.0 g/dL   AST 48 (H) 15 - 41 U/L   ALT 24 0 - 44 U/L   Alkaline Phosphatase 88 38 - 126 U/L   Total Bilirubin 4.5 (H) 0.3 - 1.2 mg/dL   GFR, Estimated >60 >60 mL/min   Anion gap 9 5 - 15  CBC     Status: Abnormal   Collection Time: 08/01/20  3:08 AM  Result Value Ref Range   WBC 4.8 4.0 - 10.5 K/uL   RBC 2.49 (L) 4.22 - 5.81 MIL/uL   Hemoglobin 8.1 (L) 13.0 - 17.0 g/dL   HCT 24.9 (L) 39.0 - 52.0 %   MCV 100.0 80.0 - 100.0 fL   MCH 32.5 26.0 - 34.0 pg   MCHC 32.5 30.0 - 36.0 g/dL   RDW 17.3 (H) 11.5 - 15.5 %   Platelets 66 (L) 150 - 400  K/uL   nRBC 0.0 0.0 - 0.2 %   Ammonia     Status: None   Collection Time: 08/01/20  8:14 AM  Result Value Ref Range   Ammonia 25 9 - 35 umol/L     DG Knee 2 Views Right  Result Date: 07/31/2020 CLINICAL DATA:  Knee pain EXAM: RIGHT KNEE - 1-2 VIEW COMPARISON:  None. FINDINGS: There are mild tricompartmental degenerative changes of the right knee. No acute displaced fracture or dislocation. No significant joint effusion. IMPRESSION: Mild tricompartmental degenerative changes of the right knee. Electronically Signed   By: Constance Holster M.D.   On: 07/31/2020 16:09   MR Brain W and Wo Contrast  Result Date: 08/01/2020 CLINICAL DATA:  Seizure, history of cirrhosis EXAM: MRI HEAD WITHOUT AND WITH CONTRAST TECHNIQUE: Multiplanar, multiecho pulse sequences of the brain and surrounding structures were obtained without and with intravenous contrast. CONTRAST:  21mL GADAVIST GADOBUTROL 1 MMOL/ML IV SOLN COMPARISON:  04/22/2020 FINDINGS: Brain: There is no acute infarction or intracranial hemorrhage. There is no intracranial mass, mass effect, or edema. There is no hydrocephalus or extra-axial fluid collection. Prominence of the ventricles and sulci reflects stable parenchymal volume loss. Patchy and mildly confluent areas of T2 hyperintensity in the supratentorial and pontine white matter are nonspecific but likely reflects stable chronic microvascular ischemic changes. No abnormal enhancement. Vascular: Major vessel flow voids at the skull base are preserved. Skull and upper cervical spine: Normal marrow signal is preserved. Sinuses/Orbits: Paranasal sinuses are aerated. Orbits are unremarkable. Other: Sella is unremarkable.  Mastoid air cells are clear. IMPRESSION: No intracranial mass or abnormal enhancement. Stable chronic microvascular ischemic changes. Electronically Signed   By: Macy Mis M.D.   On: 08/01/2020 14:51    ROS:  As stated above in the HPI otherwise negative.  Blood pressure 132/68, pulse 96, temperature 97.7  F (36.5 C), temperature source Oral, resp. rate 20, SpO2 100 %.    PE: Unable to examine.  Assessment/Plan: 1) Recurrent hepatic encephalopathy. 2) ETOH cirrhosis. 3) AKI - improved. 4) Anemia.   From the notes it appears that he is responding again to the the combination of lactulose and rifaximin.  He will remain on this regimen.  It is cost prohibitive to obtain the medication as an outpatient.  During the last hospitalization his EGD was positive for GAVE s/p APC and an healing duodenal bulb ulcer.  His HGB is currently stable. Unique Sillas D 08/01/2020, 6:14 PM

## 2020-08-01 NOTE — Progress Notes (Signed)
Orthopedic Tech Progress Note Patient Details:  Timothy Mckinney [birth year] [id number]  Ortho Devices Type of Ortho Device: Knee Sleeve Ortho Device/Splint Location: RLE Ortho Device/Splint Interventions: Ordered,Application   Post Interventions Patient Tolerated: Well Instructions Provided: Care of device   Braulio Bosch 08/01/2020, 4:07 PM

## 2020-08-01 NOTE — ED Notes (Signed)
Patient transported to MRI 

## 2020-08-01 NOTE — Consult Note (Signed)
ORTHOPAEDIC CONSULTATION  REQUESTING PHYSICIAN: Georgette Shell, MD  Chief Complaint: Right knee pain.  HPI: Timothy Mckinney is a 70 y.o. male known to our practice for lower back and right knee pain who complains of continued right knee pain. He has been admitted for encephalopath  Imaging shows mild OA and DJD changes..  Orthopedics was consulted for evaluation.    Previously ambulatory.  The patient was living with his wife.    Past Medical History:  Diagnosis Date  . Abnormal chest x-ray    ? granulomata  . Arthritis    knees and back   . DDD (degenerative disc disease), lumbar    MRI 08/2005  . Dry eyes, bilateral    Dr Bing Plume  . Elevated liver enzymes    HISTORY OF ELEVATED LIVER ENZYMES  . GERD (gastroesophageal reflux disease)    hx of   . Hypertension    NEW DIAGNOSIS AND STARTED ON METOPROLOL ON 10/04/11  . Hyperuricemia   . 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 Nephrolithiasis    Dr Serita Butcher  . Sleep apnea    STOP BANG SCORE OF 4  . Thrombocytopenia ([place])    HX OF LOW PLATELET COUNT   Past Surgical History:  Procedure Laterality Date  . ANAL FISTULECTOMY  06/01/2012   Procedure: FISTULECTOMY ANAL;  Surgeon: Leighton Ruff, MD;  Location: [facility];  Service: General;  Laterality: N/A;  Mucosal Advancement Flap   . BIOPSY  04/21/2020   Procedure: BIOPSY;  Surgeon: Carol Ada, MD;  Location: [facility];  Service: Endoscopy;;  . CARDIAC CATHETERIZATION     in 40s, negative  . colon ulcer  2011  . COLONOSCOPY W/ POLYPECTOMY  2011   Dr Sharlett Iles  . ESOPHAGOGASTRODUODENOSCOPY (EGD) WITH PROPOFOL N/A 04/21/2020   Procedure: ESOPHAGOGASTRODUODENOSCOPY (EGD) WITH PROPOFOL;  Surgeon: Carol Ada, MD;  Location: [facility];  Service: Endoscopy;  Laterality: N/A;  . ESOPHAGOGASTRODUODENOSCOPY (EGD) WITH PROPOFOL N/A 06/09/2020   Procedure: ESOPHAGOGASTRODUODENOSCOPY (EGD) WITH PROPOFOL;  Surgeon: Carol Ada, MD;  Location: [facility];   Service: Endoscopy;  Laterality: N/A;  . HOT HEMOSTASIS N/A 06/09/2020   Procedure: HOT HEMOSTASIS (ARGON PLASMA COAGULATION/BICAP);  Surgeon: Carol Ada, MD;  Location: [facility];  Service: Endoscopy;  Laterality: N/A;  . KNEE ARTHROSCOPY  2009    L  . KNEE SURGERY     april 2013 total knee replacement removed  . LITHOTRIPSY    . tear duct plugs    . TOTAL KNEE ARTHROPLASTY  2011   Workman's Comp case, Dr Veverly Fells   Social History   Socioeconomic History  . Marital status: Married    Spouse name: [name]  . Number of children: Not on file  . Years of education: Not on file  . Highest education level: Not on file  Occupational History  . Occupation: retired Hotel manager  Tobacco Use  . Smoking status: Never Smoker  . Smokeless tobacco: Never Used  Vaping Use  . Vaping Use: Never used  Substance and Sexual Activity  . Alcohol use: Yes    Comment: none in 3 months, prior 4 to 5 times per week  with heavy use  . Drug use: No  . Sexual activity: Not on file  Other Topics Concern  . Not on file  Social History Narrative  . Not on file   Social Determinants of Health   Financial Resource Strain: Not on file  Food Insecurity: No Food Insecurity  . Worried About Estate manager/land agent  of Food in the Last Year: Never true  . Ran Out of Food in the Last Year: Never true  Transportation Needs: No Transportation Needs  . Lack of Transportation (Medical): No  . Lack of Transportation (Non-Medical): No  Physical Activity: Not on file  Stress: Not on file  Social Connections: Not on file   Family History  Problem Relation Age of Onset  . Heart attack Father 64  . Colon polyps Mother   . Dementia Mother        died post SDH  . Cancer Paternal Grandfather        throat , pipe smoker  . Colon polyps Sister   . Cancer Sister        Colon, stage 2  . Hearing loss Brother   . Stroke Neg Hx    Allergies  Allergen Reactions  . Penicillins Swelling    Diffuse swelling   Prior to  Admission medications   Medication Sig Start Date End Date Taking? Authorizing Provider  acetaminophen (TYLENOL) 500 MG tablet Take 500 mg by mouth every 6 (six) hours as needed for moderate pain.   Yes [provider]  cycloSPORINE (RESTASIS) 0.05 % ophthalmic emulsion Place 1 drop into both eyes daily.   Yes [provider]  fluticasone (FLONASE) 50 MCG/ACT nasal spray Place 1 spray into both nostrils daily as needed for allergies or rhinitis.   Yes [provider]  folic acid (FOLVITE) 1 MG tablet Take 1 tablet (1 mg total) by mouth daily. 06/20/20  Yes [name], MD  furosemide (LASIX) 40 MG tablet Take 1 tablet (40 mg total) by mouth daily. 07/25/20  Yes [name], MD  lactulose (CHRONULAC) 10 GM/15ML solution Take 15 mLs (10 g total) by mouth 2 (two) times daily. Patient taking differently: Take 30 g by mouth 2 (two) times daily. 04/26/20  Yes Shelly Coss, MD  levocetirizine (XYZAL) 5 MG tablet Take 1 tablet (5 mg total) by mouth every evening. 06/20/20  Yes [name], MD  metoprolol succinate (TOPROL-XL) 25 MG 24 hr tablet TAKE 1 TABLET(25 MG) BY MOUTH DAILY Patient taking differently: Take 12.5 mg by mouth daily. 03/27/20  Yes [name], MD  Multiple Vitamins-Minerals (MULTIVITAMIN ADULT EXTRA C PO) Take 1 tablet by mouth daily.   Yes [provider]  pantoprazole (PROTONIX) 40 MG tablet Take 1 tablet (40 mg total) by mouth 2 (two) times daily. Patient taking differently: Take 40 mg by mouth daily. 06/20/20  Yes [name], MD  potassium chloride SA (KLOR-CON) 20 MEQ tablet Take 1 tablet (20 mEq total) by mouth daily. 04/26/20  Yes Shelly Coss, MD  spironolactone (ALDACTONE) 50 MG tablet Take 1 tablet (50 mg total) by mouth daily. Patient taking differently: Take 75 mg by mouth daily. 04/26/20  Yes Shelly Coss, MD  sucralfate (CARAFATE) 1 g tablet Take 1 tablet (1 g total) by mouth 4 (four) times  daily -  with meals and at bedtime. 07/03/20  Yes [name], MD  thiamine 100 MG tablet Take 1 tablet (100 mg total) by mouth daily. 04/26/20  Yes Shelly Coss, MD  XIFAXAN 550 MG TABS tablet Take 550 mg by mouth 2 (two) times daily. 07/31/20  Yes [provider]  zinc gluconate 50 MG tablet Take 50 mg by mouth daily.   Yes [provider]   DG Knee 2 Views Right  Result Date: 07/31/2020 CLINICAL DATA:  Knee pain EXAM: RIGHT KNEE -  1-2 VIEW COMPARISON:  None. FINDINGS: There are mild tricompartmental degenerative changes of the right knee. No acute displaced fracture or dislocation. No significant joint effusion. IMPRESSION: Mild tricompartmental degenerative changes of the right knee. Electronically Signed   By: Constance Holster M.D.   On: 07/31/2020 16:09   MR Brain W and Wo Contrast  Result Date: 08/01/2020 CLINICAL DATA:  Seizure, history of cirrhosis EXAM: MRI HEAD WITHOUT AND WITH CONTRAST TECHNIQUE: Multiplanar, multiecho pulse sequences of the brain and surrounding structures were obtained without and with intravenous contrast. CONTRAST:  84m GADAVIST GADOBUTROL 1 MMOL/ML IV SOLN COMPARISON:  04/22/2020 FINDINGS: Brain: There is no acute infarction or intracranial hemorrhage. There is no intracranial mass, mass effect, or edema. There is no hydrocephalus or extra-axial fluid collection. Prominence of the ventricles and sulci reflects stable parenchymal volume loss. Patchy and mildly confluent areas of T2 hyperintensity in the supratentorial and pontine white matter are nonspecific but likely reflects stable chronic microvascular ischemic changes. No abnormal enhancement. Vascular: Major vessel flow voids at the skull base are preserved. Skull and upper cervical spine: Normal marrow signal is preserved. Sinuses/Orbits: Paranasal sinuses are aerated. Orbits are unremarkable. Other: Sella is unremarkable.  Mastoid air cells are clear. IMPRESSION: No intracranial mass or  abnormal enhancement. Stable chronic microvascular ischemic changes. Electronically Signed   By: PMacy MisM.D.   On: 08/01/2020 14:51    Positive ROS: All other systems have been reviewed and were otherwise negative with the exception of those mentioned in the HPI and as above.  Objective: Labs cbc Recent Labs    07/31/20 1545 08/01/20 0308  WBC 7.0 4.8  HGB 9.4* 8.1*  HCT 29.6* 24.9*  PLT 123* 66*    Labs inflam No results for input(s): CRP in the last 72 hours.  Invalid input(s): ESR  Labs coag No results for input(s): INR, PTT in the last 72 hours.  Invalid input(s): PT  Recent Labs    07/31/20 1545 08/01/20 0308  NA 131* 134*  K 4.7 4.4  CL 98 103  CO2 22 22  GLUCOSE 161* 111*  BUN 36* 30*  CREATININE 1.20 1.03  CALCIUM 9.4 8.9    Physical Exam: Vitals:   08/01/20 1452 08/01/20 1500  BP: 120/60 (!) 112/54  Pulse: 71 71  Resp: 16 16  Temp:    SpO2: 100% 98%   General: Alert, no acute distress.  Comfortable Mental status: Alert and Oriented x3 Neurologic: Speech Clear and organized, no gross focal findings or movement disorder appreciated. Respiratory: No cyanosis, no use of accessory musculature Cardiovascular: No pedal edema GI: Abdomen is soft and non-tender, non-distended. Skin: Warm and dry.  No lesions in the area of chief complaint . Extremities: RLE: some welling, TTP at the joint line. Others: Warm and well perfused w/o edema Psychiatric: Patient is competent for consent with normal mood and affect  MUSCULOSKELETAL:  Right knee with some swelling and JLT.  Other extremities are atraumatic with painless ROM and NVI.  Assessment / Plan: Principal Problem:   Acute hepatic encephalopathy Active Problems:   GERD (gastroesophageal reflux disease)   Alcoholic liver disease (HCC)   AKI (acute kidney injury) ([facility]   A-fib (HCC)   Seizure (HCC)    Weightbearing: WBAT RLE Orthopedic device(s): J&J brace to the RLE Pain control: will  give him a dose pack to help with the swelling. Follow - up plan: follow up with Dr. KAlfonso Ramusafter d/c for further work-up Contact information:  TEdmonia LynchMD,  Margy Clarks PA-C  Rachael Fee PA-C 08/01/2020 3:45 PM

## 2020-08-01 NOTE — ED Notes (Signed)
Pt has threatened to strike this writer several times, pt is agitated wants steaks and eggs was informed we do not have that in the ED pt stated he wanted to leave. Pt seems confused on the amount of time he has been here. Pt stated he does not trust his wife or doctor. Pt stated he has not seen the doctor, pt did not want to take medication.

## 2020-08-01 NOTE — Progress Notes (Addendum)
PROGRESS NOTE    Timothy Mckinney  JJO:[id number] DOB: [birth year] DOA: 07/31/2020 PCP: [name], MD    Brief Narrative: HPI per Dr. Jonelle Sidle on 07/31/2020  Timothy Mckinney is a 70 y.o. male with medical history significant of alcoholic liver disease with cirrhosis, A. fib, degenerative disc disease, essential hypertension, hyperuricemia, nephrolithiasis who was brought to the emergency department due to altered mental status agitation.  IVC was taken out by the wife.  She was worried that he has been agitated and is a danger to himself.  He has been hostile towards family members with also complained of right knee pain where he was found to have tricompartmental osteoarthritis.  While in the ER patient is currently restrained.  Was able to communicate with me after a while.  He has been talking negatively about his wife.  Accusing his wife of all kinds of things.  He was noted to have elevated ammonia and diagnosed with hepatic encephalopathy.  He has a number of hallucinations.  While in the ER patient noted to have evidence of seizure.  He had 1 episode.  Neurology consulted.  Patient has not been on alcohol since he was diagnosed with liver disease.  No prior history of seizures.  No evidence of intracranial disease but no MRI currently available.  He is therefore being admitted with hepatic encephalopathy and new onset seizure..  ED Course: Temperature is 97.5 blood pressure 172/67 pulse 122 respirate 28 oxygen sat 96% room air.  White count 7.1 hemoglobin 9.4 was platelets 123.  Sodium is 131 potassium 4.7 chloride 98 CO2 22 BUN 36 creatinine 1.20 calcium 9.4.  Lactic acid 7.7.  Fecal occult blood testing is negative.  COVID-19 screen is negative.  Ammonia level 171.  Patient initiated on lactulose rifaximin and Keppra for seizure. Assessment & Plan:   Principal Problem:   Acute hepatic encephalopathy Active Problems:   GERD (gastroesophageal reflux disease)   Alcoholic liver disease  ([place])   AKI (acute kidney injury) ([place])   A-fib ([place])   Seizure ([place])    #1 hepatic encephalopathy his ammonia level was 171 on admission it is down to 25 today.  He is more awake alert talking still some periods of confusion but most of his talking does make sense. Continue lactulose and rifaximin.  Continue Aldactone and Lasix.  #2 new onset witnessed seizure x1 on Keppra started in the ER today.  MRI brain pending. Will order EEG.  Will discuss with neurology.  #3 severe lactic acidosis with 7.7 on admission no evidence of sepsis noted.  This is likely secondary to seizures and #1.  Lactic acid trending down to 2.8 from 7.7.  Continue to follow trend.  #4 AKI creatinine 1.03 from 1.09.  He already has peripheral edema I will DC fluids.  #5 history of chronic atrial fibrillation not on any anticoagulation.  #6 anemia of chronic disease hemoglobin 8.1 from 9.4 this is likely hemodilution.  I will order FOBT.    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 Bosie Clos.  I placed a call to him.  Patient reports he was supposed to have MRI of the right knee.pt consult  Estimated body mass index is 31.1 kg/m as calculated from the following:   Height as of 06/14/20: 5\' 11"  (1.803 m).   Weight as of 06/14/20: 101.2 kg.  DVT prophylaxis: SCD  code Status full code Family Communication Will call his wife Disposition Plan:  Status is: Inpatient  Dispo: The patient is from: Home              Anticipated d/c is to: Home              Anticipated d/c date is: 2 days              Patient currently is not medically stable to d/c.   Difficult to place patient-na  Consultants:   GI Dr. Christiane Ha Dr. Percell Miller  Procedures: None Antimicrobials none  Subjective: He is awake alert talking in full sentences Had multiple loose BMs today ammonia down to 25 on lactulose 45 g twice a day No further seizures in the ED. Concerned about low  back pain and right knee pain followed by Dr. Debroah Loop group  Objective: Vitals:   08/01/20 1030 08/01/20 1100 08/01/20 1200 08/01/20 1300  BP: (!) 113/51 (!) 109/45 (!) 114/52 126/62  Pulse: 75 74 71 77  Resp: 20 16 16  (!) 22  Temp:      TempSrc:      SpO2: 100% 99% 99% 100%    Intake/Output Summary (Last 24 hours) at 08/01/2020 1338 Last data filed at 08/01/2020 1135 Gross per 24 hour  Intake 1192.94 ml  Output --  Net 1192.94 ml   There were no vitals filed for this visit.  Examination:  General exam: Appears calm and comfortable  Respiratory system: Clear to auscultation. Respiratory effort normal. Cardiovascular system: S1 & S2 heard, RRR. No JVD, murmurs, rubs, gallops or clicks. No pedal edema. Gastrointestinal system: Abdomen is nondistended, soft and nontender. No organomegaly or masses felt. Normal bowel sounds heard. Central nervous system: Alert and oriented. No focal neurological deficits. Extremities: Right knee swollen tender Skin: No rashes, lesions or ulcers Psychiatry: Judgement and insight appear normal. Mood & affect appropriate.     Data Reviewed: I have personally reviewed following labs and imaging studies  CBC: Recent Labs  Lab 07/30/20 1716 07/31/20 1545 08/01/20 0308  WBC 5.1 7.0 4.8  NEUTROABS 3.9 5.4  --   HGB 9.8* 9.4* 8.1*  HCT 30.2* 29.6* 24.9*  MCV 98.7 99.7 100.0  PLT 105* 123* 66*   Basic Metabolic Panel: Recent Labs  Lab 07/30/20 1716 07/31/20 1545 08/01/20 0308  NA 133* 131* 134*  K 4.5 4.7 4.4  CL 101 98 103  CO2 23 22 22   GLUCOSE 124* 161* 111*  BUN 32* 36* 30*  CREATININE 1.09 1.20 1.03  CALCIUM 9.3 9.4 8.9   GFR: CrCl cannot be calculated (Unknown ideal weight.). Liver Function Tests: Recent Labs  Lab 07/30/20 1716 07/31/20 1545 08/01/20 0308  AST 53* 54* 48*  ALT 27 27 24   ALKPHOS 130* 118 88  BILITOT 4.0* 5.1* 4.5*  PROT 6.5 6.3* 5.3*  ALBUMIN 3.2* 3.2* 2.7*   No results for input(s): LIPASE, AMYLASE  in the last 168 hours. Recent Labs  Lab 07/30/20 1716 07/31/20 1645 08/01/20 0814  AMMONIA 11 171* 25   Coagulation Profile: No results for input(s): INR, PROTIME in the last 168 hours. Cardiac Enzymes: No results for input(s): CKTOTAL, CKMB, CKMBINDEX, TROPONINI in the last 168 hours. BNP (last 3 results) No results for input(s): PROBNP in the last 8760 hours. HbA1C: No results for input(s): HGBA1C in the last 72 hours. CBG: Recent Labs  Lab 07/31/20 1642  GLUCAP 126*   Lipid Profile: No results for input(s): CHOL, HDL, LDLCALC, TRIG, CHOLHDL, LDLDIRECT in the last 72 hours. Thyroid Function Tests: No results  for input(s): TSH, T4TOTAL, FREET4, T3FREE, THYROIDAB in the last 72 hours. Anemia Panel: No results for input(s): VITAMINB12, FOLATE, FERRITIN, TIBC, IRON, RETICCTPCT in the last 72 hours. Sepsis Labs: Recent Labs  Lab 07/31/20 1720 07/31/20 1932  LATICACIDVEN 7.7* 2.8*    Recent Results (from the past 240 hour(s))  Resp Panel by RT-PCR (Flu A&B, Covid) Nasopharyngeal Swab     Status: None   Collection Time: 07/31/20  4:45 PM   Specimen: Nasopharyngeal Swab; Nasopharyngeal(NP) swabs in vial transport medium  Result Value Ref Range Status   SARS Coronavirus 2 by RT PCR NEGATIVE NEGATIVE Final    Comment: (NOTE) SARS-CoV-2 target nucleic acids are NOT DETECTED.  The SARS-CoV-2 RNA is generally detectable in upper respiratory specimens during the acute phase of infection. The lowest concentration of SARS-CoV-2 viral copies this assay can detect is 138 copies/mL. A negative result does not preclude SARS-Cov-2 infection and should not be used as the sole basis for treatment or other patient management decisions. A negative result may occur with  improper specimen collection/handling, submission of specimen other than nasopharyngeal swab, presence of viral mutation(s) within the areas targeted by this assay, and inadequate number of viral copies(<138 copies/mL).  A negative result must be combined with clinical observations, patient history, and epidemiological information. The expected result is Negative.  Fact Sheet for Patients:  EntrepreneurPulse.com.au  Fact Sheet for Healthcare Providers:  IncredibleEmployment.be  This test is no t yet approved or cleared by the [place] FDA and  has been authorized for detection and/or diagnosis of SARS-CoV-2 by FDA under an Emergency Use Authorization (EUA). This EUA will remain  in effect (meaning this test can be used) for the duration of the COVID-19 declaration under Section 564(b)(1) of the Act, 21 U.S.C.section 360bbb-3(b)(1), unless the authorization is terminated  or revoked sooner.       Influenza A by PCR NEGATIVE NEGATIVE Final   Influenza B by PCR NEGATIVE NEGATIVE Final    Comment: (NOTE) The Xpert Xpress SARS-CoV-2/FLU/RSV plus assay is intended as an aid in the diagnosis of influenza from Nasopharyngeal swab specimens and should not be used as a sole basis for treatment. Nasal washings and aspirates are unacceptable for Xpert Xpress SARS-CoV-2/FLU/RSV testing.  Fact Sheet for Patients: EntrepreneurPulse.com.au  Fact Sheet for Healthcare Providers: IncredibleEmployment.be  This test is not yet approved or cleared by the [place] FDA and has been authorized for detection and/or diagnosis of SARS-CoV-2 by FDA under an Emergency Use Authorization (EUA). This EUA will remain in effect (meaning this test can be used) for the duration of the COVID-19 declaration under Section 564(b)(1) of the Act, 21 U.S.C. section 360bbb-3(b)(1), unless the authorization is terminated or revoked.  Performed at [facility], [place] [address], [place], [place] [address]          Radiology Studies: DG Knee 2 Views Right  Result Date: 07/31/2020 CLINICAL DATA:  Knee pain EXAM: RIGHT KNEE - 1-2  VIEW COMPARISON:  None. FINDINGS: There are mild tricompartmental degenerative changes of the right knee. No acute displaced fracture or dislocation. No significant joint effusion. IMPRESSION: Mild tricompartmental degenerative changes of the right knee. Electronically Signed   By: Constance Holster M.D.   On: 07/31/2020 16:09        Scheduled Meds: . cycloSPORINE  1 drop Both Eyes Daily  . folic acid  1 mg Oral Daily  . furosemide  40 mg Oral Daily  . lactulose  45 g Oral BID  .  loratadine  10 mg Oral QPM  . metoprolol succinate  12.5 mg Oral Daily  . multivitamin-iron-minerals-folic acid   Oral Daily  . pantoprazole  40 mg Oral Daily  . potassium chloride SA  20 mEq Oral Daily  . rifaximin  550 mg Oral BID  . spironolactone  75 mg Oral Daily  . sucralfate  1 g Oral TID WC & HS  . thiamine  100 mg Oral Daily   Continuous Infusions: . levETIRAcetam Stopped (08/01/20 1132)     LOS: 1 day     Georgette Shell, MD 08/01/2020, 1:38 PM

## 2020-08-01 NOTE — ED Notes (Signed)
Patient had a bowel movement. Patient cleaned up again with new sheets and new primo fit applied.

## 2020-08-01 NOTE — ED Notes (Signed)
Patient given another diet cola and says "I win again."

## 2020-08-01 NOTE — ED Notes (Signed)
Patient provided diet coke and warm blanket.

## 2020-08-02 ENCOUNTER — Inpatient Hospital Stay (HOSPITAL_COMMUNITY)
Admit: 2020-08-02 | Discharge: 2020-08-02 | Disposition: A | Discharge status: Home / Self Care | Payer: PPO | Attending: Internal Medicine | Admitting: Internal Medicine

## 2020-08-02 ENCOUNTER — Telehealth: Payer: Self-pay | Admitting: Family Medicine

## 2020-08-02 DIAGNOSIS — K7200 Acute and subacute hepatic failure without coma: Secondary | ICD-10-CM | POA: Diagnosis not present

## 2020-08-02 LAB — CBC
HCT: 24.4 % — ABNORMAL LOW (ref 39.0–52.0)
Hemoglobin: 7.8 g/dL — ABNORMAL LOW (ref 13.0–17.0)
MCH: 32.1 pg (ref 26.0–34.0)
MCHC: 32 g/dL (ref 30.0–36.0)
MCV: 100.4 fL — ABNORMAL HIGH (ref 80.0–100.0)
Platelets: 76 10*3/uL — ABNORMAL LOW (ref 150–400)
RBC: 2.43 MIL/uL — ABNORMAL LOW (ref 4.22–5.81)
RDW: 17.4 % — ABNORMAL HIGH (ref 11.5–15.5)
WBC: 4.9 10*3/uL (ref 4.0–10.5)
nRBC: 0 % (ref 0.0–0.2)

## 2020-08-02 LAB — AMMONIA: Ammonia: 25 umol/L (ref 9–35)

## 2020-08-02 LAB — COMPREHENSIVE METABOLIC PANEL
ALT: 25 U/L (ref 0–44)
AST: 47 U/L — ABNORMAL HIGH (ref 15–41)
Albumin: 2.5 g/dL — ABNORMAL LOW (ref 3.5–5.0)
Alkaline Phosphatase: 88 U/L (ref 38–126)
Anion gap: 7 (ref 5–15)
BUN: 25 mg/dL — ABNORMAL HIGH (ref 8–23)
CO2: 21 mmol/L — ABNORMAL LOW (ref 22–32)
Calcium: 8.4 mg/dL — ABNORMAL LOW (ref 8.9–10.3)
Chloride: 103 mmol/L (ref 98–111)
Creatinine, Ser: 1.05 mg/dL (ref 0.61–1.24)
GFR, Estimated: >60 mL/min (ref >60)
Glucose, Bld: 92 mg/dL (ref 70–99)
Potassium: 4.4 mmol/L (ref 3.5–5.1)
Sodium: 131 mmol/L — ABNORMAL LOW (ref 135–145)
Total Bilirubin: 3.7 mg/dL — ABNORMAL HIGH (ref 0.3–1.2)
Total Protein: 4.8 g/dL — ABNORMAL LOW (ref 6.5–8.1)

## 2020-08-02 LAB — TSH: TSH: 1.375 u[IU]/mL (ref 0.350–4.500)

## 2020-08-02 NOTE — Plan of Care (Signed)
  Problem: Health Behavior/Discharge Planning: Goal: Ability to manage health-related needs will improve Outcome: Progressing   Problem: Clinical Measurements: Goal: Ability to maintain clinical measurements within normal limits will improve Outcome: Progressing Goal: Will remain free from infection Outcome: Progressing Goal: Diagnostic test results will improve Outcome: Progressing   Problem: Pain Managment: Goal: General experience of comfort will improve Outcome: Progressing   Problem: Safety: Goal: Ability to remain free from injury will improve Outcome: Progressing   Problem: Skin Integrity: Goal: Risk for impaired skin integrity will decrease Outcome: Progressing

## 2020-08-02 NOTE — Progress Notes (Signed)
EEG complete - results pending 
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
## 2020-08-02 NOTE — Progress Notes (Signed)
PROGRESS NOTE    [name]  [id number] DOB: 1951/06/21 DOA: 07/31/2020 PCP: [name], MD    Brief Narrative:  Timothy Mckinney is a 70 year old male with past medical history significant for alcoholic liver disease with cirrhosis, paroxysmal atrial fibrillation, degenerative disc disease, essential hypertension, nephrolithiasis who is brought to the emergency department with confusion.  IVC was taken out by spouse; as she was worried that he had been agitated and danger to himself.  He apparently has been increasingly hostile towards family members.  In the ED, patient was restrained; accusing his spouse of multiple issues.  Spouse reports that he had not taken his lactulose and rifaximin for about 2 days, and has been abstinent of alcohol since he was diagnosed with liver disease.  In the ED, temperature 97.5, BP 172/67, heart rate 122, RR 28, SPO2 96% on room air.  WBC 7.1, hemoglobin 9.4, platelets 123.  Sodium 131, potassium 4.7, chloride 98, CO2 22, BUN 36, creatinine 1.20, calcium 9.4.  Lactic acid 7.7.  FOBT negative.  Covid-19 negative.  Ammonia level 171.  Patient was noted to have a seizure-like episode in the ED and was initiated on Keppra.  Started on lactulose, rifaximin.  Hospital service consulted for further evaluation and management for hepatic encephalopathy.   Assessment & Plan:   Principal Problem:   Acute hepatic encephalopathy Active Problems:   GERD (gastroesophageal reflux disease)   Alcoholic liver disease (HCC)   AKI (acute kidney injury) ([name])   A-fib (HCC)   Seizure ([name])   Acute metabolic encephalopathy Hepatic encephalopathy Patient presenting to the ED after being IVC by spouse for progressive confusion, paranoia.  Apparently he had missed 2 days of his lactulose and rifaximin.  Patient with multiple recent ED visits/hospitalizations due to similar episodes.  Followed by gastroenterology outpatient, Dr. [name]. Ammonia level elevated 171 on  admission. --ammonia 171>25>25 --Lactulose 45g ID, titrate for 3-4 soft BMs daily --Rifaximin 550 mg PO BID --supportive care, repeat ammonia level in am  Paranoia/delusions Patient continues with paranoia, feels he is being held against his will.  Spouse concerned that he continues to progress and is a danger to himself.  He has been IVC by spouse admission.  Despite improvement of his ammonia level, he continues with paranoid delusions and refusing to take his medications. --Continue IVC enacted by spouse --Psychiatry evaluation for medical decision capacity and paranoid delusions  Lactic acidosis Lactic acid 7.7 on admission, no evidence of infection/sepsis.  Etiology likely secondary to encephalopathy with dehydration/poor oral intake. --LA 7.7>2.8 --repeat lactic acid in am  History of paroxysmal atrial fibrillation Not on anticoagulation likely secondary to his liver dysfunction. --Metoprolol succinate 12.5 mg p.o. daily  Right knee Degenerative joint disease Patient with mild tricompartmental degenerative changes of the right knee.  Is followed by orthopedics outpatient, Dr. [name]. --Seen by orthopedics on 08/01/2020 donations of J&J brace to the right lower extremity and Medrol Dosepak. --Weightbearing as tolerated right lower extremity --Outpatient follow-up with orthopedics, Dr. [name] on discharge  Hx GAVE and duodenal bulb ulcer --Protonix 40 mg p.o. daily --outpatient f/u with GI, Dr. [name]  Seizure-like activity in the ED While in the ED, patient was noted to have tonic-clonic like seizure activity by ED provider.  MR brain without contrast with no intracranial mass or abnormal enhancement with stable chronic microvascular ischemic changes. --Keppra 500 mg IV BID --EEG: Pending    DVT prophylaxis: Lovenox   Code Status: Full Code Family Communication: Updated patient spouse  this afternoon in the hallway  Disposition Plan:  Level of care: Telemetry Status is:  Inpatient  Remains inpatient appropriate because:Altered mental status, Ongoing diagnostic testing needed not appropriate for outpatient work up, Unsafe d/c plan, IV treatments appropriate due to intensity of illness or inability to take PO and Inpatient level of care appropriate due to severity of illness   Dispo: The patient is from: Home              Anticipated d/c is to: to be determined              Anticipated d/c date is: 2 days              Patient currently is not medically stable to d/c.   Difficult to place patient No   Consultants:   GI, Dr. [name]  Psychiatry  Palliative care  Procedures:   none  Antimicrobials:   none   Subjective: Patient seen and examined bedside, lying in bed.  Agitated.  Sitter present.  States he is being held against his will, continues to perseverate regarding his spouse.  Initially stated he did not have his cell phone, but later produced this in the room.  Continues to refuse medications.  Continues with paranoia and irrational behavior.  Updated patient spouse who is present in hallway; requesting psychiatric and palliative care evaluation.  Patient with no other complaints at this time.  Denies headache, no chest pain, no shortness of breath, no abdominal pain.  No other acute concerns overnight per nursing staff.  Objective: Vitals:   08/02/20 0100 08/02/20 0500 08/02/20 0900 08/02/20 1500  BP: 118/74 108/76 (!) 130/49 (!) 136/55  Pulse: 77 74 73 75  Resp:   20   Temp: 98 F (36.7 C) 98.6 F (37 C) 98.6 F (37 C) 97.9 F (36.6 C)  TempSrc: Oral Oral Oral Oral  SpO2: 98% 96% 100% 99%    Intake/Output Summary (Last 24 hours) at 08/02/2020 1543 Last data filed at 08/02/2020 0500 Gross per 24 hour  Intake 720 ml  Output 1750 ml  Net -1030 ml   There were no vitals filed for this visit.  Examination:  General exam: Agitated Respiratory system: Clear to auscultation. Respiratory effort normal.  Oxygenating well on room  air Cardiovascular system: S1 & S2 heard, RRR. No JVD, murmurs, rubs, gallops or clicks. No pedal edema. Gastrointestinal system: Abdomen is nondistended, soft and nontender. No organomegaly or masses felt. Normal bowel sounds heard. Central nervous system: Alert and oriented to person/place/time, but not situation. No focal neurological deficits. Extremities: Symmetric 5 x 5 power. Skin: No rashes, lesions or ulcers Psychiatry: Judgement and insight appear poor.  Tangential thought process    Data Reviewed: I have personally reviewed following labs and imaging studies  CBC: Recent Labs  Lab 07/30/20 1716 07/31/20 1545 08/01/20 0308 08/02/20 0453  WBC 5.1 7.0 4.8 4.9  NEUTROABS 3.9 5.4  --   --   HGB 9.8* 9.4* 8.1* 7.8*  HCT 30.2* 29.6* 24.9* 24.4*  MCV 98.7 99.7 100.0 100.4*  PLT 105* 123* 66* 76*   Basic Metabolic Panel: Recent Labs  Lab 07/30/20 1716 07/31/20 1545 08/01/20 0308 08/02/20 0453  NA 133* 131* 134* 131*  K 4.5 4.7 4.4 4.4  CL 101 98 103 103  CO2 23 22 22  21*  GLUCOSE 124* 161* 111* 92  BUN 32* 36* 30* 25*  CREATININE 1.09 1.20 1.03 1.05  CALCIUM 9.3 9.4 8.9 8.4*   GFR: CrCl  cannot be calculated (Unknown ideal weight.). Liver Function Tests: Recent Labs  Lab 07/30/20 1716 07/31/20 1545 08/01/20 0308 08/02/20 0453  AST 53* 54* 48* 47*  ALT 27 27 24 25   ALKPHOS 130* 118 88 88  BILITOT 4.0* 5.1* 4.5* 3.7*  PROT 6.5 6.3* 5.3* 4.8*  ALBUMIN 3.2* 3.2* 2.7* 2.5*   No results for input(s): LIPASE, AMYLASE in the last 168 hours. Recent Labs  Lab 07/30/20 1716 07/31/20 1645 08/01/20 0814 08/02/20 0453  AMMONIA 11 171* 25 25   Coagulation Profile: No results for input(s): INR, PROTIME in the last 168 hours. Cardiac Enzymes: No results for input(s): CKTOTAL, CKMB, CKMBINDEX, TROPONINI in the last 168 hours. BNP (last 3 results) No results for input(s): PROBNP in the last 8760 hours. HbA1C: No results for input(s): HGBA1C in the last 72  hours. CBG: Recent Labs  Lab 07/31/20 1642  GLUCAP 126*   Lipid Profile: No results for input(s): CHOL, HDL, LDLCALC, TRIG, CHOLHDL, LDLDIRECT in the last 72 hours. Thyroid Function Tests: Recent Labs    08/02/20 0453  TSH 1.375   Anemia Panel: No results for input(s): VITAMINB12, FOLATE, FERRITIN, TIBC, IRON, RETICCTPCT in the last 72 hours. Sepsis Labs: Recent Labs  Lab 07/31/20 1720 07/31/20 1932  LATICACIDVEN 7.7* 2.8*    Recent Results (from the past 240 hour(s))  Resp Panel by RT-PCR (Flu A&B, Covid) Nasopharyngeal Swab     Status: None   Collection Time: 07/31/20  4:45 PM   Specimen: Nasopharyngeal Swab; Nasopharyngeal(NP) swabs in vial transport medium  Result Value Ref Range Status   SARS Coronavirus 2 by RT PCR NEGATIVE NEGATIVE Final    Comment: (NOTE) SARS-CoV-2 target nucleic acids are NOT DETECTED.  The SARS-CoV-2 RNA is generally detectable in upper respiratory specimens during the acute phase of infection. The lowest concentration of SARS-CoV-2 viral copies this assay can detect is 138 copies/mL. A negative result does not preclude SARS-Cov-2 infection and should not be used as the sole basis for treatment or other patient management decisions. A negative result may occur with  improper specimen collection/handling, submission of specimen other than nasopharyngeal swab, presence of viral mutation(s) within the areas targeted by this assay, and inadequate number of viral copies(<138 copies/mL). A negative result must be combined with clinical observations, patient history, and epidemiological information. The expected result is Negative.  Fact Sheet for Patients:  EntrepreneurPulse.com.au  Fact Sheet for Healthcare Providers:  IncredibleEmployment.be  This test is no t yet approved or cleared by the [place] FDA and  has been authorized for detection and/or diagnosis of SARS-CoV-2 by FDA under an Emergency  Use Authorization (EUA). This EUA will remain  in effect (meaning this test can be used) for the duration of the COVID-19 declaration under Section 564(b)(1) of the Act, 21 U.S.C.section 360bbb-3(b)(1), unless the authorization is terminated  or revoked sooner.       Influenza A by PCR NEGATIVE NEGATIVE Final   Influenza B by PCR NEGATIVE NEGATIVE Final    Comment: (NOTE) The Xpert Xpress SARS-CoV-2/FLU/RSV plus assay is intended as an aid in the diagnosis of influenza from Nasopharyngeal swab specimens and should not be used as a sole basis for treatment. Nasal washings and aspirates are unacceptable for Xpert Xpress SARS-CoV-2/FLU/RSV testing.  Fact Sheet for Patients: EntrepreneurPulse.com.au  Fact Sheet for Healthcare Providers: IncredibleEmployment.be  This test is not yet approved or cleared by the [place] FDA and has been authorized for detection and/or diagnosis of SARS-CoV-2 by FDA under an  Emergency Use Authorization (EUA). This EUA will remain in effect (meaning this test can be used) for the duration of the COVID-19 declaration under Section 564(b)(1) of the Act, 21 U.S.C. section 360bbb-3(b)(1), unless the authorization is terminated or revoked.  Performed at [facility], [place] [address], [place], [place] [address]   Blood culture (routine x 2)     Status: None (Preliminary result)   Collection Time: 07/31/20  5:10 PM   Specimen: BLOOD  Result Value Ref Range Status   Specimen Description   Final    BLOOD LEFT ANTECUBITAL Performed at [facility] [address] [place], [place] [address]    Special Requests   Final    BOTTLES DRAWN AEROBIC AND ANAEROBIC Blood Culture results may not be optimal due to an inadequate volume of blood received in culture bottles Performed at [facility] [address], [place], [place] [address]    Culture   Final    NO GROWTH  2 DAYS Performed at [facility] [address] [place], [place] [address]    Report Status PENDING  Incomplete  Blood culture (routine x 2)     Status: None (Preliminary result)   Collection Time: 07/31/20  5:20 PM   Specimen: BLOOD LEFT HAND  Result Value Ref Range Status   Specimen Description   Final    BLOOD LEFT HAND Performed at [facility] [address], [place], [place] [address]    Special Requests   Final    BOTTLES DRAWN AEROBIC AND ANAEROBIC Blood Culture adequate volume Performed at [facility] [address], [place], [place] [address]    Culture   Final    NO GROWTH 2 DAYS Performed at [facility] [address] [place], [place] [address]    Report Status PENDING  Incomplete         Radiology Studies: DG Knee 2 Views Right  Result Date: 07/31/2020 CLINICAL DATA:  Knee pain EXAM: RIGHT KNEE - 1-2 VIEW COMPARISON:  None. FINDINGS: There are mild tricompartmental degenerative changes of the right knee. No acute displaced fracture or dislocation. No significant joint effusion. IMPRESSION: Mild tricompartmental degenerative changes of the right knee. Electronically Signed   By: Constance Holster M.D.   On: 07/31/2020 16:09   MR Brain W and Wo Contrast  Result Date: 08/01/2020 CLINICAL DATA:  Seizure, history of cirrhosis EXAM: MRI HEAD WITHOUT AND WITH CONTRAST TECHNIQUE: Multiplanar, multiecho pulse sequences of the brain and surrounding structures were obtained without and with intravenous contrast. CONTRAST:  68mL GADAVIST GADOBUTROL 1 MMOL/ML IV SOLN COMPARISON:  04/22/2020 FINDINGS: Brain: There is no acute infarction or intracranial hemorrhage. There is no intracranial mass, mass effect, or edema. There is no hydrocephalus or extra-axial fluid collection. Prominence of the ventricles and sulci reflects stable parenchymal volume loss. Patchy and mildly confluent areas of T2 hyperintensity in the  supratentorial and pontine white matter are nonspecific but likely reflects stable chronic microvascular ischemic changes. No abnormal enhancement. Vascular: Major vessel flow voids at the skull base are preserved. Skull and upper cervical spine: Normal marrow signal is preserved. Sinuses/Orbits: Paranasal sinuses are aerated. Orbits are unremarkable. Other: Sella is unremarkable.  Mastoid air cells are clear. IMPRESSION: No intracranial mass or abnormal enhancement. Stable chronic microvascular ischemic changes. Electronically Signed   By: Macy Mis M.D.   On: 08/01/2020 14:51        Scheduled Meds: . cycloSPORINE  1 drop Both Eyes Daily  . folic acid  1 mg Oral Daily  . furosemide  40 mg Oral Daily  . lactulose  45 g Oral BID  . loratadine  10 mg Oral QPM  . methylPREDNISolone  4 mg Oral PC lunch  . methylPREDNISolone  4 mg Oral PC supper  . [START ON 08/03/2020] methylPREDNISolone  4 mg Oral 3 x daily with food  . [START ON 08/04/2020] methylPREDNISolone  4 mg Oral 4X daily taper  . methylPREDNISolone  8 mg Oral AC breakfast  . methylPREDNISolone  8 mg Oral Nightly  . [START ON 08/03/2020] methylPREDNISolone  8 mg Oral Nightly  . metoprolol succinate  12.5 mg Oral Daily  . multivitamin-iron-minerals-folic acid   Oral Daily  . pantoprazole  40 mg Oral Daily  . potassium chloride SA  20 mEq Oral Daily  . rifaximin  550 mg Oral BID  . spironolactone  75 mg Oral Daily  . sucralfate  1 g Oral TID WC & HS  . thiamine  100 mg Oral Daily   Continuous Infusions: . levETIRAcetam 500 mg (08/01/20 2207)     LOS: 2 days    Time spent: 39 minutes spent on chart review, discussion with nursing staff, consultants, updating family and interview/physical exam; more than 50% of that time was spent in counseling and/or coordination of care.    Manasvi Dickard J [place] DO [facility] Available via Epic secure chat 7am-7pm After these hours, please refer to coverage provider listed on  [email or web address] 08/02/2020, 3:43 PM
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
## 2020-08-02 NOTE — Plan of Care (Signed)
  Problem: Clinical Measurements: Goal: Ability to maintain clinical measurements within normal limits will improve Outcome: Progressing Goal: Diagnostic test results will improve Outcome: Progressing   Problem: Elimination: Goal: Will not experience complications related to bowel motility Outcome: Progressing Goal: Will not experience complications related to urinary retention Outcome: Progressing

## 2020-08-02 NOTE — TOC Progression Note (Signed)
Transition of Care [facility]) - Progression Note    Patient Details  Name: Timothy Mckinney MRN: [id number] Date of Birth: 16-Aug-1950  [facility] of [facility]) CM/SW Contact  Purcell Mouton, RN Phone Number: 08/02/2020, 2:00 PM  Clinical Narrative:     TOC will continue to follow for discharge needs.   Expected Discharge Plan: Home/Self Care Barriers to Discharge: No Barriers Identified  Expected Discharge Plan and Services Expected Discharge Plan: Home/Self Care       Living arrangements for the past 2 months: Single Family Home                                       Social Determinants of Health (SDOH) Interventions    Readmission Risk Interventions No flowsheet data found.

## 2020-08-03 ENCOUNTER — Telehealth: Payer: Self-pay

## 2020-08-03 DIAGNOSIS — R569 Unspecified convulsions: Secondary | ICD-10-CM

## 2020-08-03 DIAGNOSIS — K7200 Acute and subacute hepatic failure without coma: Secondary | ICD-10-CM | POA: Diagnosis not present

## 2020-08-03 DIAGNOSIS — F05 Delirium due to known physiological condition: Secondary | ICD-10-CM | POA: Diagnosis present

## 2020-08-03 LAB — CBC
HCT: 26 % — ABNORMAL LOW (ref 39.0–52.0)
Hemoglobin: 8.4 g/dL — ABNORMAL LOW (ref 13.0–17.0)
MCH: 32.4 pg (ref 26.0–34.0)
MCHC: 32.3 g/dL (ref 30.0–36.0)
MCV: 100.4 fL — ABNORMAL HIGH (ref 80.0–100.0)
Platelets: 73 10*3/uL — ABNORMAL LOW (ref 150–400)
RBC: 2.59 MIL/uL — ABNORMAL LOW (ref 4.22–5.81)
RDW: 17.4 % — ABNORMAL HIGH (ref 11.5–15.5)
WBC: 3.3 10*3/uL — ABNORMAL LOW (ref 4.0–10.5)
nRBC: 0 % (ref 0.0–0.2)

## 2020-08-03 LAB — COMPREHENSIVE METABOLIC PANEL
ALT: 25 U/L (ref 0–44)
AST: 45 U/L — ABNORMAL HIGH (ref 15–41)
Albumin: 2.5 g/dL — ABNORMAL LOW (ref 3.5–5.0)
Alkaline Phosphatase: 92 U/L (ref 38–126)
Anion gap: 4 — ABNORMAL LOW (ref 5–15)
BUN: 26 mg/dL — ABNORMAL HIGH (ref 8–23)
CO2: 22 mmol/L (ref 22–32)
Calcium: 8.6 mg/dL — ABNORMAL LOW (ref 8.9–10.3)
Chloride: 104 mmol/L (ref 98–111)
Creatinine, Ser: 1.18 mg/dL (ref 0.61–1.24)
GFR, Estimated: >60 mL/min (ref >60)
Glucose, Bld: 185 mg/dL — ABNORMAL HIGH (ref 70–99)
Potassium: 5.2 mmol/L — ABNORMAL HIGH (ref 3.5–5.1)
Sodium: 130 mmol/L — ABNORMAL LOW (ref 135–145)
Total Bilirubin: 3.5 mg/dL — ABNORMAL HIGH (ref 0.3–1.2)
Total Protein: 5.3 g/dL — ABNORMAL LOW (ref 6.5–8.1)

## 2020-08-03 LAB — PROTIME-INR
INR: 1.4 — ABNORMAL HIGH (ref 0.8–1.2)
Prothrombin Time: 16.7 seconds — ABNORMAL HIGH (ref 11.4–15.2)

## 2020-08-03 LAB — MAGNESIUM: Magnesium: 1.9 mg/dL (ref 1.7–2.4)

## 2020-08-03 LAB — AMMONIA: Ammonia: 21 umol/L (ref 9–35)

## 2020-08-03 LAB — LACTIC ACID, PLASMA: Lactic Acid, Venous: 1.6 mmol/L (ref 0.5–1.9)

## 2020-08-03 MED ORDER — ENSURE ENLIVE PO LIQD: 237.0000 mL | Freq: Two times a day (BID) | ORAL | Status: DC

## 2020-08-03 MED ORDER — METHYLPREDNISOLONE 4 MG PO TBPK: ORAL_TABLET | ORAL | 0 refills | Status: DC

## 2020-08-03 MED ORDER — XIFAXAN 550 MG PO TABS: 550.0000 mg | ORAL_TABLET | Freq: Two times a day (BID) | ORAL | 0 refills | Status: AC

## 2020-08-03 NOTE — Progress Notes (Signed)
Initial Nutrition Assessment  RD working remotely.  DOCUMENTATION CODES:   Not applicable  INTERVENTION:  - will order Ensure Enlive BID, each supplement provides 350 kcal and 20 grams of protein. - complete NFPE when feasible.  NUTRITION DIAGNOSIS:   Inadequate oral intake related to acute illness,lethargy/confusion as evidenced by per patient/family report.  GOAL:   Patient will meet greater than or equal to 90% of their needs  MONITOR:   PO intake,Supplement acceptance,Labs,Weight trends  REASON FOR ASSESSMENT:   Malnutrition Screening Tool  ASSESSMENT:   70 year old male with medical history of alcoholic liver disease with cirrhosis, afib, degenerative disc disease, HTN, and nephrolithiasis. He was taken to the ED due to confusion and was IVC'd by his wife d/t her concern that he was a danger to himself. He had been increasingly hostile toward family members.  Diet advanced from NPO to Heart Healthy on 1/31 at 2255 and no intakes have been documented since that time.   Patient was last assessed by this RD on 12/7. At that time, he had very deep pitting edema to BLE which is also documented in the flow sheet as recently as 0757 today.   He has not been weighed since 06/14/20 at which time weight was recorded as 223 lb.  Per notes: - being followed Psychiatry  - acute metabolic encephalopathy, hepatic encephalopathy--now resolved  - family report of paranoia and delusions - lactic acidosis - seizure-like activity in the ED    Labs reviewed; Na: 130 mmol/l, K: 5.2 mmol/l, BUN: 26 mg/dl, Ca: 8.6 mg/dl. Medications reviewed; 1 mg folvite/day, 40 mg oral lasix/day, 45 g lactulose BID, 1 centrum chew/day, 40 mg oral protonix/day, 75 mg aldactone/day, 1 g carafate TID, 100 mg thiamine/day.     NUTRITION - FOCUSED PHYSICAL EXAM:  unable to complete at this time.  Diet Order:   Diet Order            Diet Heart Room service appropriate? Yes; Fluid consistency: Thin   Diet effective now                 EDUCATION NEEDS:   Not appropriate for education at this time  Skin:  Skin Assessment: Reviewed RN Assessment  Last BM:  2/2 (type 5)  Height:   Ht Readings from Last 1 Encounters:  06/14/20 5\' 11"  (1.803 m)    Weight:   Wt Readings from Last 1 Encounters:  06/14/20 101.2 kg    Estimated Nutritional Needs:  Kcal:  1700-1900 kcal Protein:  80-90 grams Fluid:  >/= 1.5 L/day      Jarome Matin, MS, RD, LDN, CNSC Inpatient Clinical Dietitian RD pager # available in [facility]  After hours/weekend pager # available in [facility]

## 2020-08-03 NOTE — Telephone Encounter (Signed)
Thankfully since he is admitted they should be able to assess him while he is there.  She should ask for a psych referral given that he is paranoid and hallucinating.

## 2020-08-03 NOTE — Consult Note (Signed)
Timothy Mckinney is a 70 y.o. male with medical history significant of alcoholic liver disease with cirrhosis, A. fib, degenerative disc disease, essential hypertension, hyperuricemia, nephrolithiasis who was brought to the emergency department due to altered mental status agitation.  IVC was taken out by the wife.  She was worried that he has been agitated and is a danger to himself.  He has been hostile towards family members with also complained of right knee pain where he was found to have tricompartmental osteoarthritis.  While in the ER patient is currently restrained.  Was able to communicate with me after a while.  He has been talking negatively about his wife.  Accusing his wife of all kinds of things.  He was noted to have elevated ammonia and diagnosed with hepatic encephalopathy.  He has a number of hallucinations.  While in the ER patient noted to have evidence of seizure.  He had 1 episode.  Neurology consulted.  Patient has not been on alcohol since he was diagnosed with liver disease.  No prior history of seizures.  No evidence of intracranial disease but no MRI currently available.  He is therefore being admitted with hepatic encephalopathy and new onset seizure. Per chart review Timothy PASSAGE is a 69 year old male with medical history significant for alcoholic liver disease with cirrhosis, atrial fibrillation, degenerative disc disease, essential hypertension, hyperuricemia, nephrolithiasis who was brought to the emergency department due to altered mental status and agitation.  He was placed under IVC by his wife due to reports of paranoia, and agitation.  Patient's hospital course has been complicated by refusal of medications, uncooperative with medical procedures, and irrational behaviors.  There is some concern that this is likely decompensation of his liver disease, and palliative care has also been consulted.  On interview Mr. Timothy Mckinney reports that he is receiving treatment for his liver  disease.  He reports that he does not understand why his levels continue to increase despite compliance with his medication.  He reports that he takes his lactulose and rifampin daily, and despite his compliance his numbers continue to increase and no one is able to tell him why.  He reports to me that he has been in the hospital for 4 days, and which 2 of those days he said in the emergency room waiting for bed placement.  During which time he was restrained for aggression and agitation.  He denies the psychosis and paranoia that was originally reported on admission.  He states when he returned home on Sunday his family had filed a missing persons report.  He reports at that time he was going to buy a computer and a fax machine, as well as have blood work drawn at the cancer center.  He does report some increasing tensions amongst the family to include his wife and daughter whom he describes as vicious, and his son in law.  He notes some disappointment in his family, as they have taken away his personal belongings to include his keys, his wallet, his car and his rights.  He he denies suicidal ideations, homicidal ideations, and or auditory visual hallucinations.  He denies a history of suicide attempts.  He denies a history of mood symptoms.  He denies any previous substance use, with the exception of alcohol use disorder.  He denies any current or previous legal charges.  When assessing for legal charges patient does become irritable " I have never had a charge, I have never been arrested, and now I have  my rights taken away.  I have never hurt anyone or been agitated or aggressive towards my wife or any male.  Yet admitted here withno say-so."  Patient is easily redirected.  Collateral was also obtained from his wife [name], who reports she was receiving treatment at the cancer center.  She reports patient has been hostile towards family members, increasingly confused, agitated, and paranoid saying that she  was stealing things and out to hurt him.  She reports this was previously on Sunday, however after speaking with him yesterday and this morning he has improved mentation and appears to be back to his normal self.  This nurse practitioner provided some psychoeducation on liver disease and hepatic encephalopathy.  Also discussed some support groups that can be offered through the cancer center and or hospice, for patients as well as family members, and caregivers.  Wife verbalizes understanding, and has requested that IVC be discontinued.  Hospital course is complicated with alcoholic liver disease with cirrhosis, hepatic encephalopathy, refusal to participate in medical procedures, as well as paranoia and psychosis.  Psychiatric consult is placed for capacity evaluation for medical decision-making.   On evaluation patient is alert and oriented, calm and cooperative, and very pleasant with this nurse practitioner.  Patient is observed to be sitting upright at the bedside chair, and is very appropriate with nursing staff.  Patient is very circumstantial in his thought process, and continues to perseverate on multiple topics to include his hygiene " I have to poop 5-6 times a day and at bedside pain, when I can walk to the bathroom.  I have had my rights taken away from me.  My long-term family and friends to include neighbors have turned their backs on me.  My family has taken my personal belongings."  Patient is easily redirectable, however finds it difficult to remain on task due to his ruminating thoughts.  Patient is able to communicate, provide understanding, and rationalize his decision making when it comes to his current liver disease.  He also expressed interest in learning about the progression of the disease, and making arrangements for such.  He denies any current suicidal thoughts, homicidal thoughts, and or auditory visual hallucinations.  Capacity evaluation to for medical decision-making.  At  present time, the psychiatric consultation service believe the patient does have capacity with respect to making medical decisions. Criteria for decision making capacity requires patient be able to: (1) communicate a choice in a clear and consistent manner, (2) demonstrate adequate understanding of disclosed relevant information regarding he is medical condition and treatment, possible benefits and risks of that treatment, and alternative approaches, (3) describe views of he is medical condition, proposed treatment and its consequences, and (4) engage in a rational process of manipulating the relevant information to reach her/his decision. Based on our examination, the patient does meet those criteria. It should be emphasized however, that capacity may need to be re-assessed, as the patient's mental status changes over time. Also, decisional capacity for other, specific treatment/disposition decisions will need to be evaluated on an individual basis.   -At this time patient does have capacity to make medical decisions. -He does not appear to be a danger to himself or others at this time.  Therefore he no longer meets criteria for IVC.  -Recommend rescinding his IVC. -Patient is to be psychiatrically cleared. -Patient does express interest in outpatient behavioral health services, recommend working with social work to facilitate outpatient referrals.

## 2020-08-03 NOTE — Procedures (Signed)
Patient Name: Timothy Mckinney  MRN: [id number]  Epilepsy Attending: Lora Havens  Referring Physician/Provider: Dr Jacki Cones Date: 08/02/2020 Duration: 22.56 mins  Patient history: 70yo M with seizure like activity. EEG to evaluate for seizure  Level of alertness: Awake, asleep  AEDs during EEG study: LEV  Technical aspects: This EEG study was done with scalp electrodes positioned according to the 10-20 International system of electrode placement. Electrical activity was acquired at a sampling rate of 500Hz  and reviewed with a high frequency filter of 70Hz  and a low frequency filter of 1Hz . EEG data were recorded continuously and digitally stored.   Description: The posterior dominant rhythm consists of 8-9 Hz activity of moderate voltage (25-35 uV) seen predominantly in posterior head regions, symmetric and reactive to eye opening and eye closing. Sleep was characterized by vertex waves, sleep spindles (12 to 14 Hz), maximal frontocentral region. Hyperventilation and photic stimulation were not performed.     IMPRESSION: This study is within normal limits. No seizures or epileptiform discharges were seen throughout the recording.  Rokia Bosket Barbra Sarks

## 2020-08-03 NOTE — Discharge Instructions (Signed)
Hepatic Encephalopathy  Hepatic encephalopathy is a change in brain function that includes changes in the ability to think and to use muscles. This condition happens when a person has advanced liver disease. When the liver is damaged, harmful substances (toxins) can build up in the body. Some of these toxins, such as ammonia, can harm the brain. The effects of the condition depend on the type of liver damage and how severe it is. In some cases, hepatic encephalopathy can be reversed. What are the causes? Certain things can trigger or worsen liver function, which can result in hepatic encephalopathy. These things include:  Infection.  Constipation.  Taking certain medicines, such as benzodiazepines.  Alcohol use.  Bleeding into the intestinal tract.  Imbalances in minerals (electrolytes) in the body.  Dehydration. Hepatic encephalopathy can sometimes be reversed if these triggers are resolved. What increases the risk? You are at risk of developing this condition if you have advanced liver disease (cirrhosis). Conditions that can cause liver disease include:  Infections in the liver, such as hepatitis C.  Infections in the blood.  Drinking a lot of alcohol over a long period of time.  Taking certain medicines, including tranquilizers, diuretics, antidepressants, sleeping pills, or acetaminophen.  Genetic diseases, such as Wilson's disease. What are the signs or symptoms? Symptoms may develop suddenly or may develop slowly and get worse gradually. Symptoms can range from mild to severe. Mild symptoms include:  Mild confusion.  Shortened attention span.  Personality and mood changes.  Anxiety and agitation.  Drowsiness. Symptoms of worsening or severe hepatic encephalopathy include:  Extreme confusion (disorientation).  Slowed movement.  Slurred speech.  Extreme personality changes.  Abnormal shaking or flapping of the hands (asterixis).  Coma. How is this  diagnosed? This condition may be diagnosed based on:  A physical exam.  Your symptoms and medical history.  Blood tests. These may be done to check levels of ammonia in your blood, measure how long it takes your blood to clot, or check for infection.  Liver function tests. These may be done to check how well your liver is working.  MRI and CT scans. These may be done to check for a brain disorder and to check for problems with your liver.  Electroencephalogram (EEG). This test measures the electrical activity in your brain. How is this treated? The first step in treatment is to identify and treat the cause of your liver damage or triggering illness, if possible. The next step is taking medicine to lower the level of toxins in your body and prevent ammonia from building up. Treatment will depend on how severe your encephalopathy is, and may include:  Medicine to lower your ammonia level (lactulose).  Antibiotic medicine to reduce the amount of ammonia-producing bacteria in your gut.  Close monitoring of your blood pressure, heart rate, breathing, and oxygen levels.  Removal of fluid from your abdomen.  Close monitoring of how you think, feel, and act (mental status).  Changes to your diet.  Liver transplant, in severe cases. Follow these instructions at home: Medicines  Take over-the-counter and prescription medicines only as told by your health care provider.  If you were prescribed an antibiotic medicine, take it as told by your health care provider. Do not stop using the antibiotic even if you start to feel better.  Do not start taking any new medicines, including over-the-counter medicines, without first checking with your health care provider. Eating and drinking  Work with a dietitian or your health care provider   to make sure you are getting the right balance of protein and minerals.  Eat small meals throughout the day with a late-night snack of complex carbohydrates.  Do not fast.  Drink enough fluids to keep your urine pale yellow.  Do not drink alcohol.   General instructions  Do not use drugs.  Ask your health care provider if it is safe for you to drive.  Keep all follow-up visits. This is important. Contact a health care provider if:  You develop new symptoms.  Your symptoms change or get worse.  You have a fever or chills.  You have persistent nausea, vomiting, or diarrhea. Get help right away if:  You become very confused or drowsy.  You vomit blood or material that looks like coffee grounds.  Your stool is bloody, black, or looks like tar. Summary  Hepatic encephalopathy is a change in brain function that includes changes in the ability to think and to use muscles. This condition happens when a person has advanced liver disease.  Certain things can trigger or worsen hepatic encephalopathy. Hepatic encephalopathy can sometimes be reversed if these triggers are resolved.  The first step in treatment is to identify and treat the cause of your liver damage or triggering illness, if possible. The next step is taking medicine to lower the level of toxins in your body and prevent ammonia from building up.  Your treatment will depend on how severe your hepatic encephalopathy is. This information is not intended to replace advice given to you by your health care provider. Make sure you discuss any questions you have with your health care provider. Document Revised: 03/14/2020 Document Reviewed: 03/14/2020 Elsevier Patient Education  2021 [employer]  

## 2020-08-03 NOTE — Evaluation (Signed)
Physical Therapy Evaluation Patient Details Name: Timothy Mckinney MRN: [id number] DOB: 1951/06/28 Today's Date: 08/03/2020   History of Present Illness  70 year old male with past medical history significant for alcoholic liver disease with cirrhosis, paroxysmal atrial fibrillation, degenerative disc disease, essential hypertension, nephrolithiasis who is brought to the emergency department with confusion.  IVC was taken out by spouse; as she was worried that he had been agitated and danger to himself. Dx of hepatic encephalopathy, lactic acidosis, delusions/paranoia.  Clinical Impression  Pt is independent with mobility, he ambulated 180' without an assistive device, no loss of balance. From PT standpoint, he is ready to DC home. No further PT indicated. Will sign off.     Follow Up Recommendations No PT follow up    Equipment Recommendations  None recommended by PT    Recommendations for Other Services       Precautions / Restrictions Precautions Precautions: None Precaution Comments: pt denies h/o falls in past 1 year Restrictions Weight Bearing Restrictions: No      Mobility  Bed Mobility               General bed mobility comments: up in recliner    Transfers Overall transfer level: Independent Equipment used: None                Ambulation/Gait Ambulation/Gait assistance: Independent Gait Distance (Feet): 180 Feet Assistive device: None Gait Pattern/deviations: WFL(Within Functional Limits) Gait velocity: WFL   General Gait Details: steady, no loss of balance  Stairs            Wheelchair Mobility    Modified Rankin (Stroke Patients Only)       Balance Overall balance assessment: No apparent balance deficits (not formally assessed)                                           Pertinent Vitals/Pain Pain Assessment: No/denies pain    Home Living Family/patient expects to be discharged to:: Private residence Living  Arrangements: Spouse/significant other Available Help at Discharge: Family Type of Home: House Home Access: Stairs to enter Entrance Stairs-Rails: Right Entrance Stairs-Number of Steps: 4 Home Layout: Multi-level;Bed/bath upstairs Home Equipment: Environmental consultant - 2 wheels      Prior Function Level of Independence: Independent               Hand Dominance        Extremity/Trunk Assessment   Upper Extremity Assessment Upper Extremity Assessment: Overall WFL for tasks assessed    Lower Extremity Assessment Lower Extremity Assessment: Overall WFL for tasks assessed (pitting edema noted BLEs, pt reports this is managed at home with compression stockings which he doesn't have here)    Cervical / Trunk Assessment Cervical / Trunk Assessment: Normal  Communication   Communication: No difficulties  Cognition Arousal/Alertness: Awake/alert Behavior During Therapy: WFL for tasks assessed/performed Overall Cognitive Status: Within Functional Limits for tasks assessed                                        General Comments      Exercises     Assessment/Plan    PT Assessment Patent does not need any further PT services  PT Problem List         PT Treatment Interventions  PT Goals (Current goals can be found in the Care Plan section)  Acute Rehab PT Goals PT Goal Formulation: All assessment and education complete, DC therapy    Frequency     Barriers to discharge        Co-evaluation               AM-PAC PT "6 Clicks" Mobility  Outcome Measure Help needed turning from your back to your side while in a flat bed without using bedrails?: None Help needed moving from lying on your back to sitting on the side of a flat bed without using bedrails?: None Help needed moving to and from a bed to a chair (including a wheelchair)?: None Help needed standing up from a chair using your arms (e.g., wheelchair or bedside chair)?: None Help needed to  walk in hospital room?: None Help needed climbing 3-5 steps with a railing? : None 6 Click Score: 24    End of Session   Activity Tolerance: Patient tolerated treatment well Patient left: in chair;with call bell/phone within reach;with nursing/sitter in room Nurse Communication: Mobility status      Time: 4196-2229 PT Time Calculation (min) (ACUTE ONLY): 11 min   Charges:   PT Evaluation $PT Eval Low Complexity: 1 Low         Philomena Doheny PT 08/03/2020  Acute Rehabilitation Services Pager [phone] Office [phone]

## 2020-08-03 NOTE — Telephone Encounter (Signed)
Spoke with wife per provider to inform her that patient should be seen by psyc. Wife stated that he has been referred to 2 of them and that he is coming back to his self now and that she has him set up with palliate care consult.

## 2020-08-03 NOTE — TOC Progression Note (Signed)
Transition of Care [facility]) - Progression Note    Patient Details  Name: Timothy Mckinney MRN: [id number] Date of Birth: June 26, 1951  Transition of Care [facility]) CM/SW Contact  Purcell Mouton, RN Phone Number: 08/03/2020, 12:41 PM  Clinical Narrative:     Rescind IVC was faxed to [facility].  Expected Discharge Plan: Home/Self Care Barriers to Discharge: No Barriers Identified  Expected Discharge Plan and Services Expected Discharge Plan: Home/Self Care       Living arrangements for the past 2 months: Single Family Home Expected Discharge Date: 08/03/20                                     Social Determinants of Health (SDOH) Interventions    Readmission Risk Interventions No flowsheet data found.

## 2020-08-03 NOTE — Discharge Summary (Signed)
Physician Discharge Summary  Timothy Mckinney [id number] DOB: May 18, 1951 DOA: 07/31/2020  PCP: [name], MD  Admit date: 07/31/2020 Discharge date: 08/03/2020  Admitted From: Home Disposition: Home    Recommendations for Outpatient Follow-up:  1. Follow up with PCP in 1-2 weeks 2. Follow-up with gastroenterology, Dr. [name] in 1 week 3. Encourage patient to be compliant with home medication regimen as this is the leading etiology to his recurrent hospitalizations for hepatic encephalopathy  Home Health: No Equipment/Devices: None  Discharge Condition: Stable CODE STATUS: Full code Diet recommendation: Heart healthy diet  History of present illness:  Timothy Mckinney is a 70 year old male with past medical history significant for alcoholic liver disease with cirrhosis, paroxysmal atrial fibrillation, degenerative disc disease, essential hypertension, nephrolithiasis who is brought to the emergency department with confusion.  IVC was taken out by spouse; as she was worried that he had been agitated and danger to himself.  He apparently has been increasingly hostile towards family members.  In the ED, patient was restrained; accusing his spouse of multiple issues.  Spouse reports that he had not taken his lactulose and rifaximin for about 2 days, and has been abstinent of alcohol since he was diagnosed with liver disease.  In the ED, temperature 97.5, BP 172/67, heart rate 122, RR 28, SPO2 96% on room air.  WBC 7.1, hemoglobin 9.4, platelets 123.  Sodium 131, potassium 4.7, chloride 98, CO2 22, BUN 36, creatinine 1.20, calcium 9.4.  Lactic acid 7.7.  FOBT negative.  Covid-19 negative.  Ammonia level 171.  Patient was noted to have a seizure-like episode in the ED and was initiated on Keppra.  Started on lactulose, rifaximin.  Hospital service consulted for further evaluation and management for hepatic encephalopathy.  Hospital course:  Acute metabolic encephalopathy Hepatic  encephalopathy Patient presenting to the ED after being IVC by spouse for progressive confusion, paranoia.  Apparently he had missed 2 days of his lactulose and rifaximin.  Patient with multiple recent ED visits/hospitalizations due to similar episodes.  Followed by gastroenterology outpatient, Dr. [name]. Ammonia level elevated 171 on admission.  Patient was started on lactulose and rifaximin with improvement of mental status and resolution of elevated ammonia level.  Suspect, medication noncompliance is leading etiology to recurrent hepatic encephalopathy events.  Recommend close outpatient follow-up with gastroenterology, and continue to encourage patient to comply with medication regimen.  Paranoia/delusions Patient continues with paranoia, feels he is being held against his will.  Spouse concerned that he continues to progress and is a danger to himself.  He has been IVC by spouse admission.  Despite improvement of his ammonia level, he continues with paranoid delusions and refusing to take his medications. IVC enacted by spouse.  Patient was seen by psychiatry on 08/03/2020, and deemed able and competent to make his own medical decisions and subsequently his IVC was rescinded.  Recommend outpatient follow-up with behavioral health services.  Lactic acidosis resolved Lactic acid 7.7 on admission, no evidence of infection/sepsis.  Etiology likely secondary to encephalopathy with dehydration/poor oral intake.  Lactic acid 7.7 on admission, improved to 1.6 at time of discharge.    History of paroxysmal atrial fibrillation Not on anticoagulation likely secondary to his liver dysfunction. Metoprolol succinate 12.5 mg p.o. daily  Right knee Degenerative joint disease Patient with mild tricompartmental degenerative changes of the right knee.  Is followed by orthopedics outpatient, Dr. [name]. Seen by orthopedics on 08/01/2020 with recommendations of J&J brace to the right lower extremity and Medrol Dosepak.  Weightbearing as tolerated right lower extremity. Outpatient follow-up with orthopedics, Dr. [name] on discharge  Hx GAVE and duodenal bulb ulcer Protonix 40 mg p.o. daily, outpatient f/u with GI, Dr. [name]  Seizure-like activity in the ED While in the ED, patient was noted to have tonic-clonic like seizure activity by ED provider.  MR brain without contrast with no intracranial mass or abnormal enhancement with stable chronic microvascular ischemic changes.  EEG with no seizures or epileptiform discharges.  Patient had been refusing Keppra while inpatient.  Will discontinue.  Outpatient follow-up with PCP.  Suspect etiology of his seizure-like activity is related to his high ammonia level and hepatic encephalopathy.   Patient with high bounce back potential given his multiple hospitalizations for hepatic encephalopathy with associated paranoia and delusions secondary to his medication noncompliance and missing doses of his lactulose and rifaximin.  Recommend close follow-up with his PCP and specialists with further encouragement of adherence with his medication regimen.  If this is unable to be obtained, may want to consider a more palliative approach given his noncompliance with therapy.  Discharge Diagnoses:  Active Problems:   GERD (gastroesophageal reflux disease)   Alcoholic liver disease (HCC)   A-fib ([place])   Delirium due to general medical condition    Discharge Instructions  Discharge Instructions    Call MD for:  difficulty breathing, headache or visual disturbances   Complete by: As directed    Call MD for:  persistant dizziness or light-headedness   Complete by: As directed    Call MD for:  persistant nausea and vomiting   Complete by: As directed    Call MD for:  severe uncontrolled pain   Complete by: As directed    Call MD for:  temperature >100.4   Complete by: As directed    Diet - low sodium heart healthy   Complete by: As directed    Increase activity slowly    Complete by: As directed      Allergies as of 08/03/2020      Reactions   Penicillins Swelling   Diffuse swelling      Medication List    TAKE these medications   acetaminophen 500 MG tablet Commonly known as: TYLENOL Take 500 mg by mouth every 6 (six) hours as needed for moderate pain.   cycloSPORINE 0.05 % ophthalmic emulsion Commonly known as: RESTASIS Place 1 drop into both eyes daily.   fluticasone 50 MCG/ACT nasal spray Commonly known as: FLONASE Place 1 spray into both nostrils daily as needed for allergies or rhinitis.   folic acid 1 MG tablet Commonly known as: FOLVITE Take 1 tablet (1 mg total) by mouth daily.   furosemide 40 MG tablet Commonly known as: LASIX Take 1 tablet (40 mg total) by mouth daily.   lactulose 10 GM/15ML solution Commonly known as: CHRONULAC Take 15 mLs (10 g total) by mouth 2 (two) times daily. What changed: how much to take   levocetirizine 5 MG tablet Commonly known as: XYZAL Take 1 tablet (5 mg total) by mouth every evening.   methylPREDNISolone 4 MG Tbpk tablet Commonly known as: MEDROL DOSEPAK Take 1 tablet (4 mg total) by mouth 4 (four) times daily for 2 days, THEN 1 tablet (4 mg total) in the morning and at bedtime for 2 days, THEN 1 tablet (4 mg total) daily for 2 days. Start taking on: August 03, 2020   metoprolol succinate 25 MG 24 hr tablet Commonly known as: TOPROL-XL TAKE 1 TABLET(25 MG) BY  MOUTH DAILY What changed: See the new instructions.   MULTIVITAMIN ADULT EXTRA C PO Take 1 tablet by mouth daily.   pantoprazole 40 MG tablet Commonly known as: PROTONIX Take 1 tablet (40 mg total) by mouth 2 (two) times daily. What changed: when to take this   potassium chloride SA 20 MEQ tablet Commonly known as: KLOR-CON Take 1 tablet (20 mEq total) by mouth daily.   spironolactone 50 MG tablet Commonly known as: ALDACTONE Take 1 tablet (50 mg total) by mouth daily. What changed: how much to take   sucralfate 1 g  tablet Commonly known as: CARAFATE Take 1 tablet (1 g total) by mouth 4 (four) times daily -  with meals and at bedtime.   thiamine 100 MG tablet Take 1 tablet (100 mg total) by mouth daily.   Xifaxan 550 MG Tabs tablet Generic drug: rifaximin Take 1 tablet (550 mg total) by mouth 2 (two) times daily.   zinc gluconate 50 MG tablet Take 50 mg by mouth daily.       Follow-up Information    [name], MD. Schedule an appointment as soon as possible for a visit in 1 week(s).   Specialty: Family Medicine Contact information: [address] [place] [address] [phone]        Jerline Pain, MD .   Specialty: Cardiology Contact information: [phone] [address] [place] [address] [phone]        Juanita Craver, MD. Schedule an appointment as soon as possible for a visit in 1 week(s).   Specialty: Gastroenterology Contact information: [address] [place] [address] [phone]              Allergies  Allergen Reactions  . Penicillins Swelling    Diffuse swelling    Consultations:  Gastroenterology, Dr. [name], Dr. [name]  Psychiatry  Orthopedics   Procedures/Studies: EEG  Result Date: 08/03/2020 Lora Havens, MD     08/03/2020  9:45 AM Patient Name: Timothy Mckinney MRN: [id number] Epilepsy Attending: Lora Havens Referring Physician/Provider: Dr Jacki Cones Date: 08/02/2020 Duration: 22.56 mins Patient history: 70yo M with seizure like activity. EEG to evaluate for seizure Level of alertness: Awake, asleep AEDs during EEG study: LEV Technical aspects: This EEG study was done with scalp electrodes positioned according to the 10-20 International system of electrode placement. Electrical activity was acquired at a sampling rate of 500Hz  and reviewed with a high frequency filter of 70Hz  and a low frequency filter of 1Hz . EEG data were recorded continuously and digitally stored.  Description: The posterior dominant rhythm consists of 8-9 Hz activity of moderate voltage (25-35 uV) seen predominantly in posterior head regions, symmetric and reactive to eye opening and eye closing. Sleep was characterized by vertex waves, sleep spindles (12 to 14 Hz), maximal frontocentral region. Hyperventilation and photic stimulation were not performed.   IMPRESSION: This study is within normal limits. No seizures or epileptiform discharges were seen throughout the recording. Lora Havens   DG Knee 2 Views Right  Result Date: 07/31/2020 CLINICAL DATA:  Knee pain EXAM: RIGHT KNEE - 1-2 VIEW COMPARISON:  None. FINDINGS: There are mild tricompartmental degenerative changes of the right knee. No acute displaced fracture or dislocation. No significant joint effusion. IMPRESSION: Mild tricompartmental degenerative changes of the right knee. Electronically Signed   By: Constance Holster M.D.   On: 07/31/2020 16:09   MR Brain W and Wo Contrast  Result Date: 08/01/2020 CLINICAL DATA:  Seizure, history of cirrhosis EXAM: MRI HEAD WITHOUT AND WITH CONTRAST TECHNIQUE: Multiplanar, multiecho pulse sequences of the brain and surrounding structures were obtained without and with intravenous contrast. CONTRAST:  30mL GADAVIST GADOBUTROL 1 MMOL/ML IV SOLN COMPARISON:  04/22/2020 FINDINGS: Brain: There is no acute infarction or intracranial hemorrhage. There is no intracranial mass, mass effect, or edema. There is no hydrocephalus or extra-axial fluid collection. Prominence of the ventricles and sulci reflects stable parenchymal volume loss. Patchy and mildly confluent areas of T2 hyperintensity in the supratentorial and pontine white matter are nonspecific but likely reflects stable chronic microvascular ischemic changes. No abnormal enhancement. Vascular: Major vessel flow voids at the skull base are preserved. Skull and upper cervical spine: Normal marrow signal is preserved. Sinuses/Orbits: Paranasal sinuses  are aerated. Orbits are unremarkable. Other: Sella is unremarkable.  Mastoid air cells are clear. IMPRESSION: No intracranial mass or abnormal enhancement. Stable chronic microvascular ischemic changes. Electronically Signed   By: Macy Mis M.D.   On: 08/01/2020 14:51   MR ABDOMEN MRCP W WO CONTAST  Result Date: 07/09/2020 CLINICAL DATA:  Alcoholic cirrhosis. EXAM: MRI ABDOMEN WITHOUT AND WITH CONTRAST (INCLUDING MRCP) TECHNIQUE: Multiplanar multisequence MR imaging of the abdomen was performed both before and after the administration of intravenous contrast. Heavily T2-weighted images of the biliary and pancreatic ducts were obtained, and three-dimensional MRCP images were rendered by post processing. CONTRAST:  33mL MULTIHANCE GADOBENATE DIMEGLUMINE 529 MG/ML IV SOLN COMPARISON:  Noncontrast CT on 06/05/2020 FINDINGS: Lower chest: No acute findings. Hepatobiliary: Hepatic cirrhosis is demonstrated. Three adjacent lesions are seen in the dome of the right hepatic lobe, largest measuring 9 mm. All of these lesions show diffuse arterial phase hyperenhancement, but lack of contrast washout or peripheral rim enhancement (images 11-16/series 18). No other hepatic lesions identified. Numerous tiny gallstones and gallbladder sludge are seen, however there is no evidence of cholecystitis or biliary ductal dilatation. Pancreas:  No mass or inflammatory changes. Spleen: Mild splenomegaly with length measuring approximately 13 cm. This is attributable to portal venous hypertension. No splenic masses identified. Adrenals/Urinary Tract: No masses identified. Tiny sub-cm renal cysts noted bilaterally. No evidence of hydronephrosis. Stomach/Bowel: Visualized portion unremarkable. Vascular/Lymphatic: No pathologically enlarged lymph nodes identified. Portosystemic venous collateral seen in the left abdomen with mild esophageal varices, consistent with portal venous hypertension. No abdominal aortic aneurysm. Other: Mild  diffuse mesenteric and body wall edema, without ascites. Musculoskeletal:  No suspicious bone lesions identified. IMPRESSION: Hepatic cirrhosis. Three adjacent hypervascular lesions in the dome of the right lobe, largest measuring 11 mm. (LI-RADS Category 3: Intermediate probability of malignancy). Recommend continued follow-up by MRI in 6 months. Findings of portal venous hypertension, including mild esophageal varices. Cholelithiasis and gallbladder sludge. No radiographic evidence of cholecystitis or biliary ductal dilatation. Electronically Signed   By: Marlaine Hind M.D.   On: 07/09/2020 12:00   ECHOCARDIOGRAM COMPLETE  Result Date: 07/13/2020    ECHOCARDIOGRAM REPORT   Patient Name:   Timothy Mckinney [facility] Date of Exam: 07/13/2020 Medical Rec #:  [id number]      Height:       71.0 in Accession #:    [id number]     Weight:       223.0 lb Date of Birth:  04-May-1951      BSA:          2.208 m Patient Age:    7 years       BP:  100/40 mmHg Patient Gender: M              HR:           69 bpm. Exam Location:  [address] Procedure: 2D Echo, Cardiac Doppler and Color Doppler Indications:    I48.91 Atrial Fibrillation  History:        Patient has prior history of Echocardiogram examinations, most                 recent 10/15/2014. Risk Factors:Hypertension. Sleep apnea.                 Cirrhosis.  Sonographer:    Wilford Sports Rodgers-Jones RDCS Referring Phys: [id number] MARK C SKAINS IMPRESSIONS  1. Left ventricular ejection fraction, by estimation, is 60 to 65%. The left ventricle has normal function. The left ventricle has no regional wall motion abnormalities. Left ventricular diastolic parameters were normal.  2. Right ventricular systolic function is normal. The right ventricular size is moderately enlarged.  3. Left atrial size was severely dilated.  4. Right atrial size was moderately dilated.  5. The mitral valve is normal in structure. Trivial mitral valve regurgitation. No evidence of mitral stenosis.  6. The  aortic valve is tricuspid. Aortic valve regurgitation is trivial. Mild aortic valve sclerosis is present, with no evidence of aortic valve stenosis.  7. The inferior vena cava is normal in size with greater than 50% respiratory variability, suggesting right atrial pressure of 3 mmHg. FINDINGS  Left Ventricle: Left ventricular ejection fraction, by estimation, is 60 to 65%. The left ventricle has normal function. The left ventricle has no regional wall motion abnormalities. The left ventricular internal cavity size was normal in size. There is  no left ventricular hypertrophy. Left ventricular diastolic parameters were normal. Right Ventricle: The right ventricular size is moderately enlarged. No increase in right ventricular wall thickness. Right ventricular systolic function is normal. Left Atrium: Left atrial size was severely dilated. Right Atrium: Right atrial size was moderately dilated. Pericardium: There is no evidence of pericardial effusion. Mitral Valve: The mitral valve is normal in structure. Trivial mitral valve regurgitation. No evidence of mitral valve stenosis. Tricuspid Valve: The tricuspid valve is normal in structure. Tricuspid valve regurgitation is trivial. No evidence of tricuspid stenosis. Aortic Valve: The aortic valve is tricuspid. Aortic valve regurgitation is trivial. Mild aortic valve sclerosis is present, with no evidence of aortic valve stenosis. Pulmonic Valve: The pulmonic valve was normal in structure. Pulmonic valve regurgitation is trivial. No evidence of pulmonic stenosis. Aorta: The aortic root is normal in size and structure. Venous: The inferior vena cava is normal in size with greater than 50% respiratory variability, suggesting right atrial pressure of 3 mmHg. IAS/Shunts: No atrial level shunt detected by color flow Doppler.  LEFT VENTRICLE PLAX 2D LVIDd:         5.60 cm  Diastology LVIDs:         3.70 cm  LV e' medial:    12.10 cm/s LV PW:         1.10 cm  LV E/e' medial:   8.3 LV IVS:        0.90 cm  LV e' lateral:   14.50 cm/s LVOT diam:     2.60 cm  LV E/e' lateral: 6.9 LV SV:         102 LV SV Index:   46 LVOT Area:     5.31 cm  RIGHT VENTRICLE RV Basal diam:  4.80 cm RV S  prime:     20.05 cm/s TAPSE (M-mode): 2.2 cm LEFT ATRIUM              Index       RIGHT ATRIUM           Index LA diam:        5.40 cm  2.45 cm/m  RA Area:     20.30 cm LA Vol (A2C):   81.1 ml  36.72 ml/m RA Volume:   67.60 ml  30.61 ml/m LA Vol (A4C):   114.0 ml 51.62 ml/m LA Biplane Vol: 102.0 ml 46.19 ml/m  AORTIC VALVE LVOT Vmax:   73.20 cm/s LVOT Vmean:  55.700 cm/s LVOT VTI:    0.192 m  AORTA Ao Root diam: 3.70 cm Ao Asc diam:  3.80 cm MITRAL VALVE MV Area (PHT): 4.68 cm     SHUNTS MV Decel Time: 162 msec     Systemic VTI:  0.19 m MV E velocity: 100.00 cm/s  Systemic Diam: 2.60 cm MV A velocity: 55.70 cm/s MV E/A ratio:  1.80 Candee Furbish MD Electronically signed by Candee Furbish MD Signature Date/Time: 07/13/2020/12:02:59 PM    Final       Subjective: Patient seen and examined at bedside, resting comfortably in bed.  Sitter present.  Seen by psychiatry this morning and cleared psychiatrically with recommendations of rescinding IVC and outpatient follow-up with behavioral health.  He was deemed capable of making his medical decisions by psychiatry.  Patient wishes to discharge home.  No other complaints or concerns at this time.  Denies headache, no visual changes, no chest pain, palpitations, no shortness of breath, no abdominal pain.  No acute events overnight per nursing staff.  Discharge Exam: Vitals:   08/02/20 2055 08/03/20 0541  BP: (!) 107/52 (!) 122/49  Pulse: (!) 55 72  Resp: 20 18  Temp: 98.1 F (36.7 C) 98 F (36.7 C)  SpO2: 97% 100%   Vitals:   08/02/20 1930 08/02/20 2055 08/03/20 0541 08/03/20 1155  BP:  (!) 107/52 (!) 122/49   Pulse:  (!) 55 72   Resp:  20 18   Temp:  98.1 F (36.7 C) 98 F (36.7 C)   TempSrc:  Oral Oral   SpO2: 98% 97% 100%   Weight:    98.7  kg    General: Pt is alert, awake, not in acute distress Cardiovascular: RRR, S1/S2 +, no rubs, no gallops Respiratory: CTA bilaterally, no wheezing, no rhonchi Abdominal: Soft, NT, ND, bowel sounds + Extremities: 2-3+ pitting edema bilateral lower extremities to mid shin, no cyanosis    The results of significant diagnostics from this hospitalization (including imaging, microbiology, ancillary and laboratory) are listed below for reference.     Microbiology: Recent Results (from the past 240 hour(s))  Resp Panel by RT-PCR (Flu A&B, Covid) Nasopharyngeal Swab     Status: None   Collection Time: 07/31/20  4:45 PM   Specimen: Nasopharyngeal Swab; Nasopharyngeal(NP) swabs in vial transport medium  Result Value Ref Range Status   SARS Coronavirus 2 by RT PCR NEGATIVE NEGATIVE Final    Comment: (NOTE) SARS-CoV-2 target nucleic acids are NOT DETECTED.  The SARS-CoV-2 RNA is generally detectable in upper respiratory specimens during the acute phase of infection. The lowest concentration of SARS-CoV-2 viral copies this assay can detect is 138 copies/mL. A negative result does not preclude SARS-Cov-2 infection and should not be used as the sole basis for treatment or other patient management decisions. A negative result  may occur with  improper specimen collection/handling, submission of specimen other than nasopharyngeal swab, presence of viral mutation(s) within the areas targeted by this assay, and inadequate number of viral copies(<138 copies/mL). A negative result must be combined with clinical observations, patient history, and epidemiological information. The expected result is Negative.  Fact Sheet for Patients:  EntrepreneurPulse.com.au  Fact Sheet for Healthcare Providers:  IncredibleEmployment.be  This test is no t yet approved or cleared by the [place] FDA and  has been authorized for detection and/or diagnosis of SARS-CoV-2  by FDA under an Emergency Use Authorization (EUA). This EUA will remain  in effect (meaning this test can be used) for the duration of the COVID-19 declaration under Section 564(b)(1) of the Act, 21 U.S.C.section 360bbb-3(b)(1), unless the authorization is terminated  or revoked sooner.       Influenza A by PCR NEGATIVE NEGATIVE Final   Influenza B by PCR NEGATIVE NEGATIVE Final    Comment: (NOTE) The Xpert Xpress SARS-CoV-2/FLU/RSV plus assay is intended as an aid in the diagnosis of influenza from Nasopharyngeal swab specimens and should not be used as a sole basis for treatment. Nasal washings and aspirates are unacceptable for Xpert Xpress SARS-CoV-2/FLU/RSV testing.  Fact Sheet for Patients: EntrepreneurPulse.com.au  Fact Sheet for Healthcare Providers: IncredibleEmployment.be  This test is not yet approved or cleared by the [place] FDA and has been authorized for detection and/or diagnosis of SARS-CoV-2 by FDA under an Emergency Use Authorization (EUA). This EUA will remain in effect (meaning this test can be used) for the duration of the COVID-19 declaration under Section 564(b)(1) of the Act, 21 U.S.C. section 360bbb-3(b)(1), unless the authorization is terminated or revoked.  Performed at [facility], [place] [address], [place], [place] [address]   Blood culture (routine x 2)     Status: None (Preliminary result)   Collection Time: 07/31/20  5:10 PM   Specimen: BLOOD  Result Value Ref Range Status   Specimen Description   Final    BLOOD LEFT ANTECUBITAL Performed at [facility] [address] [place], [place] [address]    Special Requests   Final    BOTTLES DRAWN AEROBIC AND ANAEROBIC Blood Culture results may not be optimal due to an inadequate volume of blood received in culture bottles Performed at [facility] [address], [place], [place] [address]     Culture   Final    NO GROWTH 3 DAYS Performed at [facility], [place] [address], [place], [place] [address]    Report Status PENDING  Incomplete  Blood culture (routine x 2)     Status: None (Preliminary result)   Collection Time: 07/31/20  5:20 PM   Specimen: BLOOD LEFT HAND  Result Value Ref Range Status   Specimen Description   Final    BLOOD LEFT HAND Performed at [facility] [address], [place], [place] [address]    Special Requests   Final    BOTTLES DRAWN AEROBIC AND ANAEROBIC Blood Culture adequate volume Performed at [facility] [address] [place], [place] [address]    Culture   Final    NO GROWTH 3 DAYS Performed at [facility], [place] [address], [place], [place] [address]    Report Status PENDING  Incomplete     Labs: BNP (last 3 results) No results for input(s): BNP in the last 8760 hours. Basic Metabolic Panel: Recent Labs  Lab 07/30/20  1716 07/31/20 1545 08/01/20 0308 08/02/20 0453 08/03/20 0428  NA 133* 131* 134* 131* 130*  K 4.5 4.7 4.4 4.4 5.2*  CL 101 98 103 103 104  CO2 23 22 22  21* 22  GLUCOSE 124* 161* 111* 92 185*  BUN 32* 36* 30* 25* 26*  CREATININE 1.09 1.20 1.03 1.05 1.18  CALCIUM 9.3 9.4 8.9 8.4* 8.6*  MG  --   --   --   --  1.9   Liver Function Tests: Recent Labs  Lab 07/30/20 1716 07/31/20 1545 08/01/20 0308 08/02/20 0453 08/03/20 0428  AST 53* 54* 48* 47* 45*  ALT 27 27 24 25 25   ALKPHOS 130* 118 88 88 92  BILITOT 4.0* 5.1* 4.5* 3.7* 3.5*  PROT 6.5 6.3* 5.3* 4.8* 5.3*  ALBUMIN 3.2* 3.2* 2.7* 2.5* 2.5*   No results for input(s): LIPASE, AMYLASE in the last 168 hours. Recent Labs  Lab 07/30/20 1716 07/31/20 1645 08/01/20 0814 08/02/20 0453 08/03/20 0428  AMMONIA 11 171* 25 25 21    CBC: Recent Labs  Lab 07/30/20 1716 07/31/20 1545 08/01/20 0308 08/02/20 0453 08/03/20 0428  WBC 5.1 7.0 4.8 4.9 3.3*  NEUTROABS 3.9 5.4  --   --   --   HGB 9.8* 9.4* 8.1*  7.8* 8.4*  HCT 30.2* 29.6* 24.9* 24.4* 26.0*  MCV 98.7 99.7 100.0 100.4* 100.4*  PLT 105* 123* 66* 76* 73*   Cardiac Enzymes: No results for input(s): CKTOTAL, CKMB, CKMBINDEX, TROPONINI in the last 168 hours. BNP: Invalid input(s): POCBNP CBG: Recent Labs  Lab 07/31/20 1642  GLUCAP 126*   D-Dimer No results for input(s): DDIMER in the last 72 hours. Hgb A1c No results for input(s): HGBA1C in the last 72 hours. Lipid Profile No results for input(s): CHOL, HDL, LDLCALC, TRIG, CHOLHDL, LDLDIRECT in the last 72 hours. Thyroid function studies Recent Labs    08/02/20 0453  TSH 1.375   Anemia work up No results for input(s): VITAMINB12, FOLATE, FERRITIN, TIBC, IRON, RETICCTPCT in the last 72 hours. Urinalysis    Component Value Date/Time   COLORURINE YELLOW 06/05/2020 1315   APPEARANCEUR CLEAR 06/05/2020 1315   LABSPEC 1.018 06/05/2020 1315   PHURINE 6.0 06/05/2020 1315   GLUCOSEU NEGATIVE 06/05/2020 1315   HGBUR NEGATIVE 06/05/2020 1315   BILIRUBINUR NEGATIVE 06/05/2020 1315   KETONESUR 5 (A) 06/05/2020 1315   PROTEINUR NEGATIVE 06/05/2020 1315   UROBILINOGEN 0.2 10/15/2014 1020   NITRITE NEGATIVE 06/05/2020 1315   LEUKOCYTESUR NEGATIVE 06/05/2020 1315   Sepsis Labs Invalid input(s): PROCALCITONIN,  WBC,  LACTICIDVEN Microbiology Recent Results (from the past 240 hour(s))  Resp Panel by RT-PCR (Flu A&B, Covid) Nasopharyngeal Swab     Status: None   Collection Time: 07/31/20  4:45 PM   Specimen: Nasopharyngeal Swab; Nasopharyngeal(NP) swabs in vial transport medium  Result Value Ref Range Status   SARS Coronavirus 2 by RT PCR NEGATIVE NEGATIVE Final    Comment: (NOTE) SARS-CoV-2 target nucleic acids are NOT DETECTED.  The SARS-CoV-2 RNA is generally detectable in upper respiratory specimens during the acute phase of infection. The lowest concentration of SARS-CoV-2 viral copies this assay can detect is 138 copies/mL. A negative result does not preclude  SARS-Cov-2 infection and should not be used as the sole basis for treatment or other patient management decisions. A negative result may occur with  improper specimen collection/handling, submission of specimen other than nasopharyngeal swab, presence of viral mutation(s) within the areas targeted by this assay, and inadequate number of viral copies(<138  copies/mL). A negative result must be combined with clinical observations, patient history, and epidemiological information. The expected result is Negative.  Fact Sheet for Patients:  EntrepreneurPulse.com.au  Fact Sheet for Healthcare Providers:  IncredibleEmployment.be  This test is no t yet approved or cleared by the [place] FDA and  has been authorized for detection and/or diagnosis of SARS-CoV-2 by FDA under an Emergency Use Authorization (EUA). This EUA will remain  in effect (meaning this test can be used) for the duration of the COVID-19 declaration under Section 564(b)(1) of the Act, 21 U.S.C.section 360bbb-3(b)(1), unless the authorization is terminated  or revoked sooner.       Influenza A by PCR NEGATIVE NEGATIVE Final   Influenza B by PCR NEGATIVE NEGATIVE Final    Comment: (NOTE) The Xpert Xpress SARS-CoV-2/FLU/RSV plus assay is intended as an aid in the diagnosis of influenza from Nasopharyngeal swab specimens and should not be used as a sole basis for treatment. Nasal washings and aspirates are unacceptable for Xpert Xpress SARS-CoV-2/FLU/RSV testing.  Fact Sheet for Patients: EntrepreneurPulse.com.au  Fact Sheet for Healthcare Providers: IncredibleEmployment.be  This test is not yet approved or cleared by the [place] FDA and has been authorized for detection and/or diagnosis of SARS-CoV-2 by FDA under an Emergency Use Authorization (EUA). This EUA will remain in effect (meaning this test can be used) for the duration of  the COVID-19 declaration under Section 564(b)(1) of the Act, 21 U.S.C. section 360bbb-3(b)(1), unless the authorization is terminated or revoked.  Performed at [facility], [place] [address], [place], [place] [address]   Blood culture (routine x 2)     Status: None (Preliminary result)   Collection Time: 07/31/20  5:10 PM   Specimen: BLOOD  Result Value Ref Range Status   Specimen Description   Final    BLOOD LEFT ANTECUBITAL Performed at [facility] [address], [place], [place] [address]    Special Requests   Final    BOTTLES DRAWN AEROBIC AND ANAEROBIC Blood Culture results may not be optimal due to an inadequate volume of blood received in culture bottles Performed at [facility] [address] [place], [place] [address]    Culture   Final    NO GROWTH 3 DAYS Performed at [facility], [place] [address], [place], [place] [address]    Report Status PENDING  Incomplete  Blood culture (routine x 2)     Status: None (Preliminary result)   Collection Time: 07/31/20  5:20 PM   Specimen: BLOOD LEFT HAND  Result Value Ref Range Status   Specimen Description   Final    BLOOD LEFT HAND Performed at [facility] [address], [place], [place] [address]    Special Requests   Final    BOTTLES DRAWN AEROBIC AND ANAEROBIC Blood Culture adequate volume Performed at [facility] [address], [place], [place] [address]    Culture   Final    NO GROWTH 3 DAYS Performed at [facility], [place] [address] [place], [place] [address]    Report Status PENDING  Incomplete     Time coordinating discharge: Over 30 minutes  SIGNED:   Donnamarie Poag [place] DO  [facility] 08/03/2020, 12:18 PM

## 2020-08-03 NOTE — Plan of Care (Signed)
Discharge instructions reviewed with patient and wife, questions answered, verbalized understanding.  They understand hospitalist doctor has not given patient okay to drive, but deferred this to patients PCP /Dr. [name].  Patient transported to main entrance of hospital via wheelchair to be taken home by wife.

## 2020-08-03 NOTE — Telephone Encounter (Signed)
Called and spoke with wife and wife stated that he has been referred to 2 pysch Dr and he is improving his behavior and will have a palliative care consult today.

## 2020-08-04 ENCOUNTER — Telehealth: Payer: Self-pay

## 2020-08-04 ENCOUNTER — Telehealth (INDEPENDENT_AMBULATORY_CARE_PROVIDER_SITE_OTHER): Payer: PPO | Admitting: Family Medicine

## 2020-08-04 ENCOUNTER — Telehealth: Payer: Self-pay | Admitting: Family Medicine

## 2020-08-04 ENCOUNTER — Encounter: Payer: Self-pay | Admitting: Family Medicine

## 2020-08-04 DIAGNOSIS — K7200 Acute and subacute hepatic failure without coma: Secondary | ICD-10-CM

## 2020-08-04 DIAGNOSIS — R4182 Altered mental status, unspecified: Secondary | ICD-10-CM | POA: Diagnosis not present

## 2020-08-04 DIAGNOSIS — R634 Abnormal weight loss: Secondary | ICD-10-CM | POA: Insufficient documentation

## 2020-08-04 DIAGNOSIS — D696 Thrombocytopenia, unspecified: Secondary | ICD-10-CM | POA: Insufficient documentation

## 2020-08-04 DIAGNOSIS — Z860100 Personal history of colon polyps, unspecified: Secondary | ICD-10-CM | POA: Insufficient documentation

## 2020-08-04 DIAGNOSIS — K7030 Alcoholic cirrhosis of liver without ascites: Secondary | ICD-10-CM | POA: Insufficient documentation

## 2020-08-04 DIAGNOSIS — K7682 Hepatic encephalopathy: Secondary | ICD-10-CM

## 2020-08-04 DIAGNOSIS — Z800 Family history of malignant neoplasm of digestive organs: Secondary | ICD-10-CM | POA: Insufficient documentation

## 2020-08-04 DIAGNOSIS — R141 Gas pain: Secondary | ICD-10-CM | POA: Insufficient documentation

## 2020-08-04 DIAGNOSIS — K7290 Hepatic failure, unspecified without coma: Secondary | ICD-10-CM | POA: Insufficient documentation

## 2020-08-04 DIAGNOSIS — K8020 Calculus of gallbladder without cholecystitis without obstruction: Secondary | ICD-10-CM | POA: Insufficient documentation

## 2020-08-04 DIAGNOSIS — R152 Fecal urgency: Secondary | ICD-10-CM | POA: Insufficient documentation

## 2020-08-04 DIAGNOSIS — R194 Change in bowel habit: Secondary | ICD-10-CM | POA: Insufficient documentation

## 2020-08-04 DIAGNOSIS — K766 Portal hypertension: Secondary | ICD-10-CM | POA: Insufficient documentation

## 2020-08-04 DIAGNOSIS — R159 Full incontinence of feces: Secondary | ICD-10-CM | POA: Insufficient documentation
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
## 2020-08-04 NOTE — Telephone Encounter (Signed)
I can double book at 2:30 but please make sure pt/family is aware that I am double booking and may not be on time

## 2020-08-04 NOTE — Progress Notes (Signed)
I connected with  Timothy Mckinney on 08/04/20 by a video enabled telemedicine application and verified that I am speaking with the correct person using two identifiers.   I discussed the limitations of evaluation and management by telemedicine. The patient expressed understanding and agreed to proceed.

## 2020-08-04 NOTE — Telephone Encounter (Signed)
Pt's wife called in asking if there was anyway of working pt in today for a video visit. She states that he told her that he would only listen to Dr. Birdie Riddle. She states that he feels like everyone is out to get him and he will not listen to them.   Please advise

## 2020-08-04 NOTE — Telephone Encounter (Signed)
Called patient wife and them aware that Dr. Birdie Riddle has double booked pt today and be running a little behind. Family understood.

## 2020-08-04 NOTE — Telephone Encounter (Signed)
Patient is on schedule for 2:30 today.

## 2020-08-05 DIAGNOSIS — R4182 Altered mental status, unspecified: Secondary | ICD-10-CM | POA: Diagnosis not present

## 2020-08-05 DIAGNOSIS — R918 Other nonspecific abnormal finding of lung field: Secondary | ICD-10-CM | POA: Diagnosis not present

## 2020-08-05 DIAGNOSIS — F919 Conduct disorder, unspecified: Secondary | ICD-10-CM | POA: Diagnosis not present

## 2020-08-05 DIAGNOSIS — R404 Transient alteration of awareness: Secondary | ICD-10-CM | POA: Diagnosis not present

## 2020-08-05 DIAGNOSIS — K7030 Alcoholic cirrhosis of liver without ascites: Secondary | ICD-10-CM | POA: Diagnosis not present

## 2020-08-05 DIAGNOSIS — J984 Other disorders of lung: Secondary | ICD-10-CM | POA: Diagnosis not present

## 2020-08-05 DIAGNOSIS — Z79899 Other long term (current) drug therapy: Secondary | ICD-10-CM | POA: Diagnosis not present

## 2020-08-05 DIAGNOSIS — F1097 Alcohol use, unspecified with alcohol-induced persisting dementia: Secondary | ICD-10-CM | POA: Diagnosis not present

## 2020-08-05 DIAGNOSIS — Z639 Problem related to primary support group, unspecified: Secondary | ICD-10-CM | POA: Diagnosis not present

## 2020-08-05 DIAGNOSIS — I959 Hypotension, unspecified: Secondary | ICD-10-CM | POA: Diagnosis not present

## 2020-08-05 DIAGNOSIS — G319 Degenerative disease of nervous system, unspecified: Secondary | ICD-10-CM | POA: Diagnosis not present

## 2020-08-05 DIAGNOSIS — F332 Major depressive disorder, recurrent severe without psychotic features: Secondary | ICD-10-CM | POA: Diagnosis not present

## 2020-08-05 DIAGNOSIS — Z880 Allergy status to penicillin: Secondary | ICD-10-CM | POA: Diagnosis not present

## 2020-08-05 DIAGNOSIS — R451 Restlessness and agitation: Secondary | ICD-10-CM | POA: Diagnosis not present

## 2020-08-05 DIAGNOSIS — I10 Essential (primary) hypertension: Secondary | ICD-10-CM | POA: Diagnosis not present

## 2020-08-05 LAB — CULTURE, BLOOD (ROUTINE X 2)
Culture: NO GROWTH
Culture: NO GROWTH
Special Requests: ADEQUATE

## 2020-08-06 DIAGNOSIS — R4689 Other symptoms and signs involving appearance and behavior: Secondary | ICD-10-CM | POA: Diagnosis not present

## 2020-08-06 DIAGNOSIS — F1027 Alcohol dependence with alcohol-induced persisting dementia: Secondary | ICD-10-CM | POA: Diagnosis not present

## 2020-08-06 DIAGNOSIS — F332 Major depressive disorder, recurrent severe without psychotic features: Secondary | ICD-10-CM | POA: Diagnosis not present

## 2020-08-07 DIAGNOSIS — Z639 Problem related to primary support group, unspecified: Secondary | ICD-10-CM | POA: Diagnosis not present

## 2020-08-07 DIAGNOSIS — F331 Major depressive disorder, recurrent, moderate: Secondary | ICD-10-CM | POA: Diagnosis not present

## 2020-08-07 DIAGNOSIS — K7030 Alcoholic cirrhosis of liver without ascites: Secondary | ICD-10-CM | POA: Diagnosis not present

## 2020-08-07 DIAGNOSIS — F609 Personality disorder, unspecified: Secondary | ICD-10-CM | POA: Diagnosis not present

## 2020-08-08 ENCOUNTER — Telehealth: Payer: Self-pay

## 2020-08-08 ENCOUNTER — Telehealth: Payer: Self-pay | Admitting: Family Medicine

## 2020-08-08 NOTE — Telephone Encounter (Signed)
Following up with the Education officer, museum. Have not heard anything from anyone and patient is getting much worse.

## 2020-08-08 NOTE — Telephone Encounter (Signed)
Patient was admitted to [facility] center but they released him to a hotel because they said he was capable of making his own medical decisions.  Please check into the referral to Social Work and she would also like to know if you can refer him to a geriatric psychologist.  Wife states that she can not take her phone into court but if you call her daughter will have the phone and she will speak with you.

## 2020-08-08 NOTE — Telephone Encounter (Signed)
Spoke with daughter in regards to patient behavior.

## 2020-08-09 ENCOUNTER — Encounter (HOSPITAL_COMMUNITY): Payer: Self-pay

## 2020-08-09 ENCOUNTER — Emergency Department (HOSPITAL_COMMUNITY)
Admission: EM | Admit: 2020-08-09 | Discharge: 2020-08-11 | Disposition: A | Discharge status: Home / Self Care | Payer: PPO | Attending: Emergency Medicine | Admitting: Emergency Medicine

## 2020-08-09 DIAGNOSIS — Z79899 Other long term (current) drug therapy: Secondary | ICD-10-CM | POA: Diagnosis not present

## 2020-08-09 DIAGNOSIS — Z20822 Contact with and (suspected) exposure to covid-19: Secondary | ICD-10-CM | POA: Insufficient documentation

## 2020-08-09 DIAGNOSIS — R609 Edema, unspecified: Secondary | ICD-10-CM | POA: Diagnosis not present

## 2020-08-09 DIAGNOSIS — F600 Paranoid personality disorder: Secondary | ICD-10-CM | POA: Insufficient documentation

## 2020-08-09 DIAGNOSIS — Z96659 Presence of unspecified artificial knee joint: Secondary | ICD-10-CM | POA: Insufficient documentation

## 2020-08-09 DIAGNOSIS — K7290 Hepatic failure, unspecified without coma: Secondary | ICD-10-CM | POA: Insufficient documentation

## 2020-08-09 DIAGNOSIS — I10 Essential (primary) hypertension: Secondary | ICD-10-CM | POA: Diagnosis not present

## 2020-08-09 DIAGNOSIS — F322 Major depressive disorder, single episode, severe without psychotic features: Secondary | ICD-10-CM | POA: Diagnosis not present

## 2020-08-09 DIAGNOSIS — K769 Liver disease, unspecified: Secondary | ICD-10-CM | POA: Diagnosis not present

## 2020-08-09 DIAGNOSIS — R462 Strange and inexplicable behavior: Secondary | ICD-10-CM | POA: Diagnosis present

## 2020-08-09 DIAGNOSIS — K7460 Unspecified cirrhosis of liver: Secondary | ICD-10-CM

## 2020-08-09 DIAGNOSIS — I4891 Unspecified atrial fibrillation: Secondary | ICD-10-CM | POA: Diagnosis not present

## 2020-08-09 DIAGNOSIS — Z515 Encounter for palliative care: Secondary | ICD-10-CM | POA: Diagnosis not present

## 2020-08-09 DIAGNOSIS — F22 Delusional disorders: Secondary | ICD-10-CM | POA: Diagnosis not present

## 2020-08-09 DIAGNOSIS — R404 Transient alteration of awareness: Secondary | ICD-10-CM | POA: Diagnosis not present

## 2020-08-09 LAB — URINALYSIS, ROUTINE W REFLEX MICROSCOPIC
Bacteria, UA: NONE SEEN
Bilirubin Urine: NEGATIVE
Glucose, UA: NEGATIVE mg/dL
Ketones, ur: NEGATIVE mg/dL
Leukocytes,Ua: NEGATIVE
Nitrite: NEGATIVE
Protein, ur: NEGATIVE mg/dL
Specific Gravity, Urine: 1.021 (ref 1.005–1.030)
pH: 5 (ref 5.0–8.0)

## 2020-08-09 LAB — COMPREHENSIVE METABOLIC PANEL
ALT: 35 U/L (ref 0–44)
AST: 49 U/L — ABNORMAL HIGH (ref 15–41)
Albumin: 3 g/dL — ABNORMAL LOW (ref 3.5–5.0)
Alkaline Phosphatase: 136 U/L — ABNORMAL HIGH (ref 38–126)
Anion gap: 11 (ref 5–15)
BUN: 26 mg/dL — ABNORMAL HIGH (ref 8–23)
CO2: 23 mmol/L (ref 22–32)
Calcium: 9 mg/dL (ref 8.9–10.3)
Chloride: 102 mmol/L (ref 98–111)
Creatinine, Ser: 1.12 mg/dL (ref 0.61–1.24)
GFR, Estimated: >60 mL/min (ref >60)
Glucose, Bld: 127 mg/dL — ABNORMAL HIGH (ref 70–99)
Potassium: 3.9 mmol/L (ref 3.5–5.1)
Sodium: 136 mmol/L (ref 135–145)
Total Bilirubin: 4.4 mg/dL — ABNORMAL HIGH (ref 0.3–1.2)
Total Protein: 5.8 g/dL — ABNORMAL LOW (ref 6.5–8.1)

## 2020-08-09 LAB — CBC WITH DIFFERENTIAL/PLATELET
Abs Immature Granulocytes: 0.04 10*3/uL (ref 0.00–0.07)
Basophils Absolute: 0 10*3/uL (ref 0.0–0.1)
Basophils Relative: 0 %
Eosinophils Absolute: 0.1 10*3/uL (ref 0.0–0.5)
Eosinophils Relative: 1 %
HCT: 27.9 % — ABNORMAL LOW (ref 39.0–52.0)
Hemoglobin: 9.1 g/dL — ABNORMAL LOW (ref 13.0–17.0)
Immature Granulocytes: 1 %
Lymphocytes Relative: 11 %
Lymphs Abs: 0.9 10*3/uL (ref 0.7–4.0)
MCH: 32.2 pg (ref 26.0–34.0)
MCHC: 32.6 g/dL (ref 30.0–36.0)
MCV: 98.6 fL (ref 80.0–100.0)
Monocytes Absolute: 0.8 10*3/uL (ref 0.1–1.0)
Monocytes Relative: 11 %
Neutro Abs: 6.1 10*3/uL (ref 1.7–7.7)
Neutrophils Relative %: 76 %
Platelets: 72 10*3/uL — ABNORMAL LOW (ref 150–400)
RBC: 2.83 MIL/uL — ABNORMAL LOW (ref 4.22–5.81)
RDW: 17.2 % — ABNORMAL HIGH (ref 11.5–15.5)
WBC: 7.9 10*3/uL (ref 4.0–10.5)
nRBC: 0 % (ref 0.0–0.2)

## 2020-08-09 LAB — RAPID URINE DRUG SCREEN, HOSP PERFORMED
Amphetamines: NOT DETECTED
Barbiturates: NOT DETECTED
Benzodiazepines: POSITIVE — AB
Cocaine: NOT DETECTED
Opiates: NOT DETECTED
Tetrahydrocannabinol: NOT DETECTED

## 2020-08-09 LAB — ACETAMINOPHEN LEVEL: Acetaminophen (Tylenol), Serum: <10 ug/mL — ABNORMAL LOW (ref 10–30)

## 2020-08-09 LAB — RESP PANEL BY RT-PCR (FLU A&B, COVID) ARPGX2
Influenza A by PCR: NEGATIVE
Influenza B by PCR: NEGATIVE
SARS Coronavirus 2 by RT PCR: NEGATIVE

## 2020-08-09 LAB — SALICYLATE LEVEL: Salicylate Lvl: <7 mg/dL — ABNORMAL LOW (ref 7.0–30.0)

## 2020-08-09 LAB — AMMONIA: Ammonia: 22 umol/L (ref 9–35)

## 2020-08-09 LAB — ETHANOL: Alcohol, Ethyl (B): <10 mg/dL (ref <10)

## 2020-08-09 MED ORDER — THIAMINE HCL 100 MG PO TABS
100.0000 mg | ORAL_TABLET | Freq: Every day | ORAL | Status: DC
  Administered 2020-08-10 – 2020-08-11 (×2): 100 mg via ORAL
  Filled 2020-08-09 (×2): qty 1

## 2020-08-09 MED ORDER — POTASSIUM CHLORIDE CRYS ER 20 MEQ PO TBCR
20.0000 meq | EXTENDED_RELEASE_TABLET | Freq: Every day | ORAL | Status: DC
  Administered 2020-08-10 – 2020-08-11 (×2): 20 meq via ORAL
  Filled 2020-08-09 (×2): qty 1

## 2020-08-09 MED ORDER — HALOPERIDOL LACTATE 5 MG/ML IJ SOLN
5.0000 mg | Freq: Once | INTRAMUSCULAR | Status: AC
  Administered 2020-08-09: 5 mg via INTRAMUSCULAR
  Filled 2020-08-09: qty 1

## 2020-08-09 MED ORDER — METOPROLOL SUCCINATE ER 25 MG PO TB24
12.5000 mg | ORAL_TABLET | Freq: Every day | ORAL | Status: DC
  Administered 2020-08-10 – 2020-08-11 (×2): 12.5 mg via ORAL
  Filled 2020-08-09 (×3): qty 0.5

## 2020-08-09 MED ORDER — ZINC GLUCONATE 50 MG PO TABS: 50.0000 mg | ORAL_TABLET | Freq: Every day | ORAL | Status: DC

## 2020-08-09 MED ORDER — ACETAMINOPHEN 500 MG PO TABS: 500.0000 mg | ORAL_TABLET | Freq: Four times a day (QID) | ORAL | Status: DC | PRN

## 2020-08-09 MED ORDER — SPIRONOLACTONE 25 MG PO TABS
75.0000 mg | ORAL_TABLET | Freq: Every day | ORAL | Status: DC
  Administered 2020-08-10 – 2020-08-11 (×2): 75 mg via ORAL
  Filled 2020-08-09 (×2): qty 3

## 2020-08-09 MED ORDER — PANTOPRAZOLE SODIUM 40 MG PO TBEC
40.0000 mg | DELAYED_RELEASE_TABLET | Freq: Every day | ORAL | Status: DC
  Administered 2020-08-10 – 2020-08-11 (×2): 40 mg via ORAL
  Filled 2020-08-09 (×2): qty 1

## 2020-08-09 MED ORDER — FUROSEMIDE 40 MG PO TABS
40.0000 mg | ORAL_TABLET | Freq: Every day | ORAL | Status: DC
  Administered 2020-08-10 – 2020-08-11 (×2): 40 mg via ORAL
  Filled 2020-08-09 (×2): qty 1

## 2020-08-09 MED ORDER — CYCLOSPORINE 0.05 % OP EMUL
1.0000 [drp] | Freq: Every day | OPHTHALMIC | Status: DC
  Administered 2020-08-10 – 2020-08-11 (×2): 1 [drp] via OPHTHALMIC
  Filled 2020-08-09 (×2): qty 1

## 2020-08-09 MED ORDER — LACTULOSE 10 GM/15ML PO SOLN
30.0000 g | Freq: Two times a day (BID) | ORAL | Status: DC
  Administered 2020-08-10 – 2020-08-11 (×3): 30 g via ORAL
  Filled 2020-08-09 (×4): qty 45
  Filled 2020-08-09: qty 60

## 2020-08-09 MED ORDER — FOLIC ACID 1 MG PO TABS
1.0000 mg | ORAL_TABLET | Freq: Every day | ORAL | Status: DC
  Administered 2020-08-10 – 2020-08-11 (×2): 1 mg via ORAL
  Filled 2020-08-09 (×2): qty 1

## 2020-08-09 MED ORDER — ZINC SULFATE 220 (50 ZN) MG PO CAPS
220.0000 mg | ORAL_CAPSULE | Freq: Every day | ORAL | Status: DC
  Administered 2020-08-10 – 2020-08-11 (×2): 220 mg via ORAL
  Filled 2020-08-09 (×2): qty 1

## 2020-08-09 MED ORDER — SUCRALFATE 1 G PO TABS
1.0000 g | ORAL_TABLET | Freq: Three times a day (TID) | ORAL | Status: DC
  Administered 2020-08-10 – 2020-08-11 (×2): 1 g via ORAL
  Filled 2020-08-09 (×5): qty 1

## 2020-08-09 MED ORDER — RIFAXIMIN 550 MG PO TABS
550.0000 mg | ORAL_TABLET | Freq: Two times a day (BID) | ORAL | Status: DC
  Administered 2020-08-10 – 2020-08-11 (×2): 550 mg via ORAL
  Filled 2020-08-09 (×5): qty 1

## 2020-08-09 MED ORDER — LORATADINE 10 MG PO TABS
10.0000 mg | ORAL_TABLET | Freq: Every day | ORAL | Status: DC
  Administered 2020-08-11: 10 mg via ORAL
  Filled 2020-08-09 (×2): qty 1

## 2020-08-09 MED ORDER — FLUTICASONE PROPIONATE 50 MCG/ACT NA SUSP: 1.0000 | Freq: Every day | NASAL | Status: DC | PRN

## 2020-08-09 MED ORDER — LEVOCETIRIZINE DIHYDROCHLORIDE 5 MG PO TABS: 5.0000 mg | ORAL_TABLET | Freq: Every evening | ORAL | Status: DC
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
## 2020-08-09 NOTE — BH Assessment (Signed)
Comprehensive Clinical Assessment (CCA) Note  08/09/2020 Timothy Mckinney [id number] -Clinician reviewed note by Dr. Zenia Resides.  Pt is a 70 year old male with history of recent hospitalization for psychiatric concerns presents due to increased bizarre behavior.  Patient presents via Event organiser as his wife is taken out IVC paperwork.  Patient had been admitted to [facility] for paranoia and was deemed competent and discharged to a hotel.  At the hotel, patient had more paranoia.  No SI or HI.  Due to concern for patient safety, IVC paperwork taken out.  Patient is upset about having to stay in the [facility].  He says that he is tired of people thinking he is crazy.  Patient goes on to say that he has "$20 billion in the bank."  He is going to fire everyone in healthcare.  He said that he has talked recently to President Trump.  Patient said that the police are guarding his house to make sure that his wife is safe.    Patient will make a statement and when he is reminded of the statement he will say "you're lying."  He says this about others, that they are lying about him or that he is going to fire them.  Patient said he has a psychiatrist in [place] (who he is going to fire) but wife said he did not have one at all.    Patient gave permission to talk to his wife, [name].  She said that patient has had times over the last week or so where he will be talking to someone not there.  He has told her at times "I don't know who I am or who you are."  Patient has had mood swings with family.    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    Wife and family are concerned about patient's well-being if he is discharged home.  She said that he was at [facility]. from Friday (02/04) through Monday (02/07).  He was discharged from there on an Melburn Popper to the [employer].  He stayed there until today.  Patient had not wanted  to go home because he did not trust family so he went to the [facility].  Patent has good eye contact but is oriented x2.  He has a difficult time following conversation.  He has flight of ideas and delusional thoughts.  Patient has been sleeping <4H/D of sleep.    Per wife, patient has not had any previous inpatient or outpatient psychiatric care.  -Clinician did discuss patient care with Lindon Romp, FNP.  He recommends gero psych placement.  Clinician informed Dr. Zenia Resides of disposition via IM.  Chief Complaint:  Chief Complaint  Patient presents with  . Paranoid   Visit Diagnosis: MDD, recurrent severe   CCA Screening, Triage and Referral (STR)  Patient Reported Information How did you hear about [place]? Legal System (Pt is on IVC.)  Referral name: No data recorded Referral phone number: No data recorded  Whom do you see for routine medical problems? Primary Care  Practice/Facility Name: [facility]  Practice/Facility Phone Number: No data recorded Name of Contact: Dr. [name]  Contact Number: No data recorded Contact Fax Number: No data recorded Prescriber Name: No data recorded Prescriber Address (if known): No data recorded  What Is the Reason for Your Visit/Call Today? Pt says that he has $20 billion  in the bank.  He had said he has no SI or HI.  He says he has no A/V hallucinations.Marland Kitchen   He is irritable and to most of my inquiries he says "that's a lie."  He  How Long Has This Been Causing You Problems? No data recorded What Do You Feel Would Help You the Most Today? Medication   Have You Recently Been in Any Inpatient Treatment (Hospital/Detox/Crisis Center/28-Day Program)? No  Name/Location of Program/Hospital:No data recorded How Long Were You There? No data recorded When Were You Discharged? No data recorded  Have You Ever Received Services From [facility] Before? Yes  Who Do You See at [facility]? Pt has [facility] in [place].   Have You Recently Had Any Thoughts About Hurting Yourself? No  Are You Planning to Commit Suicide/Harm Yourself At This time? No   Have you Recently Had Thoughts About [place]? No  Explanation: No data recorded  Have You Used Any Alcohol or Drugs in the Past 24 Hours? No  How Long Ago Did You Use Drugs or Alcohol? No data recorded What Did You Use and How Much? No data recorded  Do You Currently Have a Therapist/Psychiatrist? No  Name of Therapist/Psychiatrist: Wife said that patient does not have a psychiatrist.   Have You Been Recently Discharged From Any Office Practice or Programs? No  Explanation of Discharge From Practice/Program: No data recorded    CCA Screening Triage Referral Assessment Type of Contact: Tele-Assessment  Is this Initial or Reassessment? Initial Assessment  Date Telepsych consult ordered in [facility]:  08/09/2020  Time Telepsych consult ordered in [facility]:  2012   Patient Reported Information Reviewed? No data recorded Patient Left Without Being Seen? No data recorded Reason for Not Completing Assessment: No data recorded  Collateral Involvement: Maven Varelas, wife.  Phone [phone]   Does Patient Have a Court Appointed Legal Guardian? No data recorded Name and Contact of Legal Guardian: No data recorded If Minor and Not Living with Parent(s), Who has Custody? No data recorded Is CPS involved or ever been involved? No data recorded Is APS involved or ever been involved? Never   Patient Determined To Be At Risk for Harm To Self or Others Based on Review of Patient Reported Information or Presenting Complaint? Yes, for Self-Harm  Method: No data recorded Availability of Means: No data recorded Intent: No data recorded Notification Required: No data recorded Additional Information for Danger to Others Potential: No data recorded Additional Comments for Danger to Others Potential: No data recorded Are There  Guns or Other Weapons in Your Home? No data recorded Types of Guns/Weapons: No data recorded Are These Weapons Safely Secured?                            No data recorded Who Could Verify You Are Able To Have These Secured: No data recorded Do You Have any Outstanding Charges, Pending Court Dates, Parole/Probation? No data recorded Contacted To Inform of Risk of Harm To Self or Others: -- (Patient is having delusional thoughts.  Unsure if he would harm someone or himself.)   Location of Assessment: [facility]   Does Patient Present under Involuntary Commitment? Yes  IVC Papers Initial File Date: 08/09/2020   [place] of Residence: [place]   Patient Currently Receiving the Following Services: Not Receiving Services   Determination of Need: Emergent (2 hours)   Options For Referral: Inpatient Hospitalization  CCA Biopsychosocial Intake/Chief Complaint:  Clinician talked with wife.  He gave verbal permission to talk to her.  Patient thinks that his wife is mentally ill and is unjustly having him committed.  He talks about having "$20 billion in the bank."  He had said earlier he was in touch with President Trump.  He will talk about how he is going to fire everyone.  Pt denies SI, Hi or A/V hallucinations.  Patient will talk to people not present according to wife.  Current Symptoms/Problems: Wife said that patient has cirrohsis of the liver.  He will have ammonia buildup which can cause delusional thoughts.  Patient will talk to people not present.  He told wife at one point that he did not know her and that he did not know who he was.  On Saturday night (02/05) patient was at [facility].  Patient stayed there until 02/07.  He refused to go home from [place] but wanted to go to [employer].  Pt was given an Surveyor, mining ride to [employer] from [place].  Patient stayed there until today (02/09).  Patient reportedly had told an EMS driver today that he was suicidal.  Wife  said that patient has hepatic encephalopathy.  Wife said that patient can do his ADLs.  Wife is concerned about how to handle patient when he returns home.   Patient Reported Schizophrenia/Schizoaffective Diagnosis in Past: No   Strengths: No data recorded Preferences: No data recorded Abilities: No data recorded  Type of Services Patient Feels are Needed: No data recorded  Initial Clinical Notes/Concerns: No data recorded  Mental Health Symptoms Depression:  Difficulty Concentrating; Sleep (too much or little)   Duration of Depressive symptoms: No data recorded  Mania:  Change in energy/activity; Racing thoughts; Recklessness   Anxiety:   Difficulty concentrating; Tension; Worrying   Psychosis:  Delusions; Grossly disorganized speech   Duration of Psychotic symptoms: Less than six months   Trauma:  None   Obsessions:  None   Compulsions:  No data recorded  Inattention:  No data recorded  Hyperactivity/Impulsivity:  No data recorded  Oppositional/Defiant Behaviors:  No data recorded  Emotional Irregularity:  Potentially harmful impulsivity   Other Mood/Personality Symptoms:  No data recorded   Mental Status Exam Appearance and self-care  Stature:  No data recorded  Weight:  No data recorded  Clothing:  No data recorded  Grooming:  No data recorded  Cosmetic use:  No data recorded  Posture/gait:  Tense   Motor activity:  Agitated   Sensorium  Attention:  Confused   Concentration:  Anxiety interferes   Orientation:  Person; Place   Recall/memory:  Defective in Short-term; Defective in Recent   Affect and Mood  Affect:  Anxious; Negative   Mood:  Anxious   Relating  Eye contact:  Normal   Facial expression:  Anxious; Tense; Angry   Attitude toward examiner:  Argumentative   Thought and Language  Speech flow: Flight of Ideas; Loud   Thought content:  Delusions   Preoccupation:  Obsessions   Hallucinations:  Auditory (Will talk to people not  there.)   Organization:  No data recorded  [employer] of Knowledge:  No data recorded  Intelligence:  No data recorded  Abstraction:  No data recorded  Judgement:  No data recorded  Reality Testing:  No data recorded  Insight:  No data recorded  Decision Making:  No data recorded  Social Functioning  Social Maturity:  No data  recorded  Social Judgement:  No data recorded  Stress  Stressors:  No data recorded  Coping Ability:  No data recorded  Skill Deficits:  Decision making   Supports:  Family     Religion:    Leisure/Recreation:    Exercise/Diet: Exercise/Diet Do You Have Any Trouble Sleeping?: Yes Explanation of Sleeping Difficulties: <4H/D for the last week at least.   CCA Employment/Education Employment/Work Situation: Employment / Work Situation Employment situation: Retired Has patient ever been in the [employer]?: No  Education: Education Did Physicist, medical?: Yes What Type of College Degree Do you Have?: Did some college   CCA Family/Childhood History Family and Relationship History: Family history Marital status: Married Number of Years Married: 82  Childhood History:  Childhood History By whom was/is the patient raised?: Both parents Does patient have siblings?: Yes Number of Siblings: 2 Did patient suffer any verbal/emotional/physical/sexual abuse as a child?: No Did patient suffer from severe childhood neglect?: No Has patient ever been sexually abused/assaulted/raped as an adolescent or adult?: No Was the patient ever a victim of a crime or a disaster?: No Witnessed domestic violence?: No Has patient been affected by domestic violence as an adult?: No  Child/Adolescent Assessment:     CCA Substance Use Alcohol/Drug Use: Alcohol / Drug Use Pain Medications: See PTA medication list Prescriptions: See PTA medication list Over the Counter: See PTA medication list. History of alcohol / drug use?: Yes Substance  #1 Name of Substance 1: ETOH 1 - Age of First Use: unknown 1 - Amount (size/oz): Pt has not had a drink since September 2021 1 - Frequency: Pt has not had a drink since September 2021 1 - Duration: Pt has not had a drink since September 2021 1 - Last Use / Amount: Pt has not had a drink since September 2021                       ASAM's:  Six Dimensions of Multidimensional Assessment  Dimension 1:  Acute Intoxication and/or Withdrawal Potential:      Dimension 2:  Biomedical Conditions and Complications:      Dimension 3:  Emotional, Behavioral, or Cognitive Conditions and Complications:     Dimension 4:  Readiness to Change:     Dimension 5:  Relapse, Continued use, or Continued Problem Potential:     Dimension 6:  Recovery/Living Environment:     ASAM Severity Score:    ASAM Recommended Level of Treatment:     Substance use Disorder (SUD)    Recommendations for Services/Supports/Treatments:    DSM5 Diagnoses: Patient Active Problem List   Diagnosis Date Noted  . Abnormal weight loss 08/04/2020  . Alcoholic cirrhosis ([place]) 24/40/1027  . Change in bowel habit 08/04/2020  . Cholelithiasis 08/04/2020  . Family history of malignant neoplasm of digestive organ 08/04/2020  . Fecal urgency 08/04/2020  . Flatulence, eructation and gas pain 08/04/2020  . Hepatic failure ([place]) 08/04/2020  . Incontinence of feces 08/04/2020  . Morbid obesity ([place]) 08/04/2020  . Personal history of colonic polyps 08/04/2020  . Portal hypertension ([place]) 08/04/2020  . Thrombocytopenia ([place]) 08/04/2020  . Delirium due to general medical condition 08/03/2020  . Hepatic encephalopathy ([place]) 07/31/2020  . AMS (altered mental status) 06/05/2020  . A-fib ([place]) 05/22/2020  . Duodenal ulcer 05/22/2020  . Encephalopathy 05/22/2020  . Blood loss anemia 05/22/2020  . Thyroiditis, unspecified 05/22/2020  . Edema 01/20/2019  . Coagulopathy ([place]) 07/06/2018  . Kidney stones 07/06/2018  .  Lower  extremity edema 04/30/2018  . Folate deficiency 02/25/2018  . Chronic low back pain 02/11/2018  . Alcoholic liver disease ([place]) 07/29/2017  . Other pancytopenia ([place]) 07/16/2017  . PND (post-nasal drip) 06/17/2016  . Generalized anxiety disorder 06/13/2015  . Physical exam 12/09/2014  . Sarcoidosis ([place]) 11/09/2014  . Multiple pulmonary nodules 11/09/2014  . Hilar adenopathy 11/09/2014  . Knee pain   . Pulmonary nodules with adenopathy (since 2001 CT Chest) 10/15/2014  . Obesity (BMI 30-39.9) 08/05/2014  . Hypertension 08/05/2014  . 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 TRANSAMINASES, SERUM, ELEVATED 08/15/2009  . GERD (gastroesophageal reflux disease) 06/12/2009  . NEPHROLITHIASIS, HX OF 06/12/2009  . Hyperuricemia 06/12/2009  . CHEST XRAY, ABNORMAL 06/12/2009    Patient Centered Plan: Patient is on the following Treatment Plan(s):  Anxiety, Depression and Impulse Control   Referrals to Alternative Service(s): Referred to Alternative Service(s):   Place:   Date:   Time:    Referred to Alternative Service(s):   Place:   Date:   Time:    Referred to Alternative Service(s):   Place:   Date:   Time:    Referred to Alternative Service(s):   Place:   Date:   Time:     Waldron Session

## 2020-08-09 NOTE — ED Notes (Signed)
Patient refusing to dress out into scrubs or take medication at this time. Police at bedside.

## 2020-08-09 NOTE — Telephone Encounter (Signed)
I have placed the [facility] referral as urgent for social work and placement options Unfortunately, I am not able to declare competency as this is a legal process and not a medical one As for geriatric psych- we can provide resources but not make referrals.  [place] [phone] and Dr Casimiro Needle [phone]

## 2020-08-09 NOTE — ED Notes (Signed)
Pt refusing to change clothes. Dr. Zenia Resides made aware. Dr. Zenia Resides and writer of this note talked to pt. Pt is still refusing. Security called, pt refusing to change. Security talked with pt about changing clothes. Pt states, "I will swing on you if you come near me." Pt clothing was removed and purple scrubs placed on pt. Pt was compliant with allowing RN to put shirt on him. During process, pt obtained a skin tear to his left hand. Site was cleaned and dressed by RN. Dr. Zenia Resides made aware of skin tear. Pt continues to refuse to take medications. Pt requesting food. Ham sandwich with mayo given and gingerale. Pt was appreciative of sandwich. Pt was moved to room 20, pt was able to stand and sit onto bed.

## 2020-08-09 NOTE — Telephone Encounter (Signed)
Spoke with wife and  informed her that a referral has been placed on her husbands behalf for social work as urgent and to look out for a call form them. Also gave her the names of the pysch that she asked for. Wife voiced understanding. No further concerns at this  time.

## 2020-08-09 NOTE — ED Provider Notes (Signed)
[facility] DEPT Provider Note   CSN: [id number] Arrival date & time: 08/09/20  1736     History Chief Complaint  Patient presents with  . Paranoid    Timothy Mckinney is a 70 y.o. male.  70 year old male with history of recent hospitalization for psychiatric concerns presents due to increased bizarre behavior.  Patient presents via Event organiser as his wife is taken out IVC paperwork.  Patient had been admitted to [facility] for paranoia and was deemed competent and discharged to a hotel.  At the hotel, patient had more paranoia.  No SI or HI.  Due to concern for patient safety, IVC paperwork taken out.        Past Medical History:  Diagnosis Date  . Abnormal chest x-ray    ? granulomata  . Arthritis    knees and back   . DDD (degenerative disc disease), lumbar    MRI 08/2005  . Dry eyes, bilateral    Dr Bing Plume  . Elevated liver enzymes    HISTORY OF ELEVATED LIVER ENZYMES  . GERD (gastroesophageal reflux disease)    hx of   . Hypertension    NEW DIAGNOSIS AND STARTED ON METOPROLOL ON 10/04/11  . Hyperuricemia   . 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 Nephrolithiasis    Dr Serita Butcher  . Sleep apnea    STOP BANG SCORE OF 4  . Thrombocytopenia ([place])    HX OF LOW PLATELET COUNT    Patient Active Problem List   Diagnosis Date Noted  . Abnormal weight loss 08/04/2020  . Alcoholic cirrhosis ([place]) 40/98/1191  . Change in bowel habit 08/04/2020  . Cholelithiasis 08/04/2020  . Family history of malignant neoplasm of digestive organ 08/04/2020  . Fecal urgency 08/04/2020  . Flatulence, eructation and gas pain 08/04/2020  . Hepatic failure ([place]) 08/04/2020  . Incontinence of feces 08/04/2020  . Morbid obesity ([place]) 08/04/2020  . Personal history of colonic polyps 08/04/2020  . Portal hypertension ([place]) 08/04/2020  . Thrombocytopenia ([place]) 08/04/2020  . Delirium due to general medical condition 08/03/2020  . 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 AMS (altered mental status) 06/05/2020  . A-fib ([place]) 05/22/2020  . Duodenal ulcer 05/22/2020  . Encephalopathy 05/22/2020  . Blood loss anemia 05/22/2020  . Thyroiditis, unspecified 05/22/2020  . Edema 01/20/2019  . Coagulopathy ([place]) 07/06/2018  . Kidney stones 07/06/2018  . Lower extremity edema 04/30/2018  . Folate deficiency 02/25/2018  . Chronic low back pain 02/11/2018  . Alcoholic liver disease ([place]) 07/29/2017  . Other pancytopenia ([place]) 07/16/2017  . PND (post-nasal drip) 06/17/2016  . Generalized anxiety disorder 06/13/2015  . Physical exam 12/09/2014  . Sarcoidosis ([place]) 11/09/2014  . Multiple pulmonary nodules 11/09/2014  . Hilar adenopathy 11/09/2014  . Knee pain   . Pulmonary nodules with adenopathy (since 2001 CT Chest) 10/15/2014  . Obesity (BMI 30-39.9) 08/05/2014  . Hypertension 08/05/2014  . 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 TRANSAMINASES, SERUM, ELEVATED 08/15/2009  . GERD (gastroesophageal reflux disease) 06/12/2009  . NEPHROLITHIASIS, HX OF 06/12/2009  . Hyperuricemia 06/12/2009  . CHEST XRAY, ABNORMAL 06/12/2009    Past Surgical History:  Procedure Laterality Date  . ANAL FISTULECTOMY  06/01/2012   Procedure: FISTULECTOMY ANAL;  Surgeon: Leighton Ruff, MD;  Location: [facility];  Service: General;  Laterality: N/A;  Mucosal Advancement Flap   . BIOPSY  04/21/2020   Procedure: BIOPSY;  Surgeon: Carol Ada, MD;  Location: [facility] END[facility]COPY;  Service: Endoscopy;;  . CARDIAC CATHETERIZATION     in 41s, negative  . colon ulcer  2011  . COLONOSCOPY W/ POLYPECTOMY  2011   Dr Sharlett Iles  . ESOPHAGOGASTRODUODENOSCOPY (EGD) WITH PROPOFOL N/A 04/21/2020   Procedure: ESOPHAGOGASTRODUODENOSCOPY (EGD) WITH PROPOFOL;  Surgeon: Carol Ada, MD;  Location: [facility];  Service: Endoscopy;  Laterality: N/A;  . ESOPHAGOGASTRODUODENOSCOPY (EGD) WITH PROPOFOL  N/A 06/09/2020   Procedure: ESOPHAGOGASTRODUODENOSCOPY (EGD) WITH PROPOFOL;  Surgeon: Carol Ada, MD;  Location: [facility];  Service: Endoscopy;  Laterality: N/A;  . HOT HEMOSTASIS N/A 06/09/2020   Procedure: HOT HEMOSTASIS (ARGON PLASMA COAGULATION/BICAP);  Surgeon: Carol Ada, MD;  Location: [facility];  Service: Endoscopy;  Laterality: N/A;  . KNEE ARTHROSCOPY  2009    L  . KNEE SURGERY     april 2013 total knee replacement removed  . LITHOTRIPSY    . tear duct plugs    . TOTAL KNEE ARTHROPLASTY  2011   Workman's Comp case, Dr Veverly Fells       Family History  Problem Relation Age of Onset  . Heart attack Father 79  . Colon polyps Mother   . Dementia Mother        died post SDH  . Cancer Paternal Grandfather        throat , pipe smoker  . Colon polyps Sister   . Cancer Sister        Colon, stage 2  . Hearing loss Brother   . Stroke Neg Hx     Social History   Tobacco Use  . Smoking status: Never Smoker  . Smokeless tobacco: Never Used  Vaping Use  . Vaping Use: Never used  Substance Use Topics  . Alcohol use: Yes    Comment: none in 3 months, prior 4 to 5 times per week  with heavy use  . Drug use: No    Home Medications Prior to Admission medications   Medication Sig Start Date End Date Taking? Authorizing Provider  acetaminophen (TYLENOL) 500 MG tablet Take 500 mg by mouth every 6 (six) hours as needed for moderate pain.    [provider]  cycloSPORINE (RESTASIS) 0.05 % ophthalmic emulsion Place 1 drop into both eyes daily.    [provider]  fluticasone (FLONASE) 50 MCG/ACT nasal spray Place 1 spray into both nostrils daily as needed for allergies or rhinitis.    [provider]  folic acid (FOLVITE) 1 MG tablet Take 1 tablet (1 mg total) by mouth daily. 06/20/20   [name], MD  furosemide (LASIX) 40 MG tablet Take 1 tablet (40 mg total) by mouth daily. 07/25/20   [name], MD  lactulose (CHRONULAC) 10  GM/15ML solution Take 15 mLs (10 g total) by mouth 2 (two) times daily. Patient taking differently: Take 30 g by mouth 2 (two) times daily. 04/26/20   Shelly Coss, MD  levocetirizine (XYZAL) 5 MG tablet Take 1 tablet (5 mg total) by mouth every evening. 06/20/20   [name], MD  methylPREDNISolone (MEDROL DOSEPAK) 4 MG TBPK tablet Take 1 tablet (4 mg total) by mouth 4 (four) times daily for 2 days, THEN 1 tablet (4 mg total) in the morning and at bedtime for 2 days, THEN 1 tablet (4 mg total) daily for 2 days. 08/03/20 08/09/20  [place] Donnamarie Poag, DO  metoprolol succinate (TOPROL-XL) 25 MG 24 hr tablet TAKE 1 TABLET(25 MG) BY MOUTH DAILY Patient taking  differently: Take 12.5 mg by mouth daily. 03/27/20   [name], MD  Multiple Vitamins-Minerals (MULTIVITAMIN ADULT EXTRA C PO) Take 1 tablet by mouth daily.    [provider]  pantoprazole (PROTONIX) 40 MG tablet Take 1 tablet (40 mg total) by mouth 2 (two) times daily. Patient taking differently: Take 40 mg by mouth daily. 06/20/20   [name], MD  potassium chloride SA (KLOR-CON) 20 MEQ tablet Take 1 tablet (20 mEq total) by mouth daily. 04/26/20   Shelly Coss, MD  spironolactone (ALDACTONE) 50 MG tablet Take 1 tablet (50 mg total) by mouth daily. Patient taking differently: Take 75 mg by mouth daily. 04/26/20   Shelly Coss, MD  sucralfate (CARAFATE) 1 g tablet Take 1 tablet (1 g total) by mouth 4 (four) times daily -  with meals and at bedtime. 07/03/20   [name], MD  thiamine 100 MG tablet Take 1 tablet (100 mg total) by mouth daily. 04/26/20   Shelly Coss, MD  XIFAXAN 550 MG TABS tablet Take 1 tablet (550 mg total) by mouth 2 (two) times daily. 08/03/20 11/01/20  [place] Eric J, DO  zinc gluconate 50 MG tablet Take 50 mg by mouth daily.    [provider]    Allergies    Penicillins  Review of Systems   Review of Systems  Unable to perform ROS: Psychiatric disorder     Physical Exam Updated Vital Signs BP (!) 172/75 (BP Location: Left Arm)   Pulse (!) 116   Temp 99.6 F (37.6 C) (Oral)   Resp 18   SpO2 99%   Physical Exam Vitals and nursing note reviewed.  Constitutional:      General: He is not in acute distress.    Appearance: Normal appearance. He is well-developed and well-nourished. He is not toxic-appearing.  HENT:     Head: Normocephalic and atraumatic.  Eyes:     General: Lids are normal.     Extraocular Movements: EOM normal.     Conjunctiva/sclera: Conjunctivae normal.     Pupils: Pupils are equal, round, and reactive to light.  Neck:     Thyroid: No thyroid mass.     Trachea: No tracheal deviation.  Cardiovascular:     Rate and Rhythm: Normal rate and regular rhythm.     Heart sounds: Normal heart sounds. No murmur heard. No gallop.   Pulmonary:     Effort: Pulmonary effort is normal. No respiratory distress.     Breath sounds: Normal breath sounds. No stridor. No decreased breath sounds, wheezing, rhonchi or rales.  Abdominal:     General: Bowel sounds are normal. There is no distension.     Palpations: Abdomen is soft.     Tenderness: There is no abdominal tenderness. There is no CVA tenderness or rebound.  Musculoskeletal:        General: No tenderness or edema. Normal range of motion.     Cervical back: Normal range of motion and neck supple.  Skin:    General: Skin is warm and dry.     Findings: No abrasion or rash.  Neurological:     Mental Status: He is alert and oriented to person, place, and time.     GCS: GCS eye subscore is 4. GCS verbal subscore is 5. GCS motor subscore is 6.     Cranial Nerves: No cranial nerve deficit.     Sensory: No sensory deficit.     Deep Tendon Reflexes: Strength normal.  Psychiatric:        Attention and Perception: Attention normal.        Mood and Affect: Affect is labile.        Speech: Speech is rapid and pressured.        Behavior: Behavior is combative.        Thought  Content: Thought content does not include suicidal ideation. Thought content does not include suicidal plan.     ED Results / Procedures / Treatments   Labs (all labs ordered are listed, but only abnormal results are displayed) Labs Reviewed  URINE CULTURE  RESP PANEL BY RT-PCR (FLU A&B, COVID) ARPGX2  URINALYSIS, ROUTINE W REFLEX MICROSCOPIC  ETHANOL  RAPID URINE DRUG SCREEN, HOSP PERFORMED  SALICYLATE LEVEL  ACETAMINOPHEN LEVEL  CBC WITH DIFFERENTIAL/PLATELET  COMPREHENSIVE METABOLIC PANEL  AMMONIA    EKG None  Radiology No results found.  Procedures Procedures   Medications Ordered in ED Medications  acetaminophen (TYLENOL) tablet 500 mg (has no administration in time range)  cycloSPORINE (RESTASIS) 0.05 % ophthalmic emulsion 1 drop (has no administration in time range)  fluticasone (FLONASE) 50 MCG/ACT nasal spray 1 spray (has no administration in time range)  folic acid (FOLVITE) tablet 1 mg (has no administration in time range)  furosemide (LASIX) tablet 40 mg (has no administration in time range)  lactulose (CHRONULAC) 10 GM/15ML solution 30 g (has no administration in time range)  levocetirizine (XYZAL) tablet 5 mg (has no administration in time range)  metoprolol succinate (TOPROL-XL) 24 hr tablet 12.5 mg (has no administration in time range)  pantoprazole (PROTONIX) EC tablet 40 mg (has no administration in time range)  potassium chloride SA (KLOR-CON) CR tablet 20 mEq (has no administration in time range)  sucralfate (CARAFATE) tablet 1 g (has no administration in time range)  spironolactone (ALDACTONE) tablet 75 mg (has no administration in time range)  thiamine tablet 100 mg (has no administration in time range)  rifaximin (XIFAXAN) tablet 550 mg (has no administration in time range)  zinc gluconate tablet 50 mg (has no administration in time range)    ED Course  I have reviewed the triage vital signs and the nursing notes.  Pertinent labs & imaging  results that were available during my care of the patient were reviewed by me and considered in my medical decision making (see chart for details).    MDM Rules/Calculators/A&P                          With normal ammonia level here. Urinalysis negative for infection. Patient has been very combative. But able to be redirected with verbal de-escalation. He is now medically cleared for psychiatric referral Final Clinical Impression(s) / ED Diagnoses Final diagnoses:  None    Rx / DC Orders ED Discharge Orders    None       Lacretia Leigh, MD 08/09/20 2019

## 2020-08-09 NOTE — ED Notes (Signed)
Per officer family would like to speak with MD regarding patients care. [phone]

## 2020-08-09 NOTE — ED Notes (Addendum)
Pt refused to dress out into burgundy scrubs at this time.  Pt stated this to Dr. Zenia Resides as well.

## 2020-08-09 NOTE — ED Notes (Addendum)
Patient asking for help getting back to bed. Patient denying putting himself in the floor. Security called for assistance.

## 2020-08-09 NOTE — ED Notes (Signed)
Spoke with daughter and mother on the phone.   Expresses they are desperate for our help. Reports patient is in liver failure and wants ammonia levels checked. Patient has been off his medication for over a week now. Daughter reports he is in danger to hisself and others. Daughter wants patient committed to a behavioral health center or a long care health center. Daughter expressing need for help to hold him and not to release him tonight and to call them before releasing.

## 2020-08-09 NOTE — ED Triage Notes (Signed)
Patient arrived via [facility] from [facility].   Called out for "Paranoia". Patient thinks someone is out to get him and states someone was in his room. Patient concerned for his safety.   Family is taking out IVC papers right now.   UnKnown if patient is compliant with medication.   Denies SI or HI.   Swelling to lower extremities that started today per patient.   Patient reports multiple BMs today. Denies diarrhea.   BP-142/90 P-84 RR_22 100% RA

## 2020-08-09 NOTE — ED Notes (Addendum)
Patient difficult to redirect. Patient continues to lay in floor and act erratic. Patient continues to have delusions of grandeur, talking about his bank account and friendship with Daisy Floro. Patient states that his wife is "numb and dumb" and shouldn't be trusted for the IVC. Patient unwilling to take oral medications. Security called to bedside to assist with medication administration.

## 2020-08-09 NOTE — ED Notes (Signed)
Belongings placed in bag by security and labeled. Placed in cabinet for room 20.

## 2020-08-09 NOTE — ED Notes (Signed)
Pt. Placed paper towel in sink. Pt. Attempted to have bowel movement in sink. Pt. Taken  to the bathroom and redirected back to his room. RN, Naomie Dean. Made aware.

## 2020-08-09 NOTE — ED Notes (Addendum)
This Chief Strategy Officer witnessed patient laying himself on the floor stating, "I sleep better on the floor."

## 2020-08-10 ENCOUNTER — Emergency Department (HOSPITAL_COMMUNITY): Payer: PPO

## 2020-08-10 DIAGNOSIS — I4891 Unspecified atrial fibrillation: Secondary | ICD-10-CM | POA: Diagnosis not present

## 2020-08-10 DIAGNOSIS — K7290 Hepatic failure, unspecified without coma: Secondary | ICD-10-CM | POA: Diagnosis not present

## 2020-08-10 DIAGNOSIS — F22 Delusional disorders: Secondary | ICD-10-CM | POA: Diagnosis not present

## 2020-08-10 DIAGNOSIS — K7460 Unspecified cirrhosis of liver: Secondary | ICD-10-CM

## 2020-08-10 DIAGNOSIS — G9340 Encephalopathy, unspecified: Secondary | ICD-10-CM

## 2020-08-10 DIAGNOSIS — K769 Liver disease, unspecified: Secondary | ICD-10-CM | POA: Diagnosis not present

## 2020-08-10 DIAGNOSIS — Z515 Encounter for palliative care: Secondary | ICD-10-CM

## 2020-08-10 MED ORDER — OXYCODONE HCL 20 MG/ML PO CONC
5.0000 mg | ORAL | Status: DC | PRN
  Filled 2020-08-10: qty 1

## 2020-08-10 MED ORDER — LORAZEPAM 2 MG/ML PO CONC
0.5000 mg | Freq: Every day | ORAL | Status: DC
  Administered 2020-08-10: 0.5 mg via ORAL
  Filled 2020-08-10 (×2): qty 0.25

## 2020-08-10 NOTE — Progress Notes (Signed)
Palliative Care Plan of Care  Timothy Mckinney was identified as having high risk for 12 month mortality based on our gero-palliative predictive analytics tool. I have reviewed his chart for potential unmet palliative care needs. 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 He also has His MELD score is 25. He has an elevated INR, his bili is very high at 4.4, his sodium is low, his albumin is low-all are very poor prognostic indicators. Based on his MELD score he has a 20% 3 month mortality. Additional review demonstrates multiple rib fractures in various states of healing on an CT done less that 2 months ago along with two transverse process fractures and severe degenerative disease. He has also had issues with severe constipation. Review also demonstrates there may be family and caregiver distress.  Recommend formal palliative care consult for symptom management and goals of care discussion.  Lane Hacker, DO Palliative Medicine

## 2020-08-10 NOTE — ED Notes (Signed)
Sitter assisting patient with shower

## 2020-08-10 NOTE — BH Assessment (Signed)
[facility] Assessment Progress Note  Pt's nurse, Catalina Antigua, contacted this Probation officer, reporting that pt's wife, Dandy Lazaro [phone]), is asking questions about pt's disposition.  Pt has granted verbal consent to speak to her.  At 14:36 I called her and spoke to her.  I reported that our behavioral health provider has found that pt requires psychiatric hospitalization at this time and that I am actively seeking placement for him at a geriatric psychiatry unit, but that no facility has agreed to accept him up to this time.  I also informed her that pt will be seen by behavioral health providers while at [facility] and that we will be working to stabilize him while I search.  I informed her that psychiatric hospitalization is for short term crisis stabilization, and that if she feels that pt will need memory care when he is ready for discharge, she should start looking right away.  I asked if pt has a healthcare power of attorney or a legal guardian, and she said no, but that the family is pursuing guardianship with a court date scheduled.  I asked her to please make letter of guardianship available if it is granted.  She asked for further guidance with memory care placement.  I have asked EDP Blanchie Dessert, MD to order a [facility] consult to address this with Ms Rockie Neighbours, and [facility] staff has been notified of this request.  Jalene Mullet, [place] Coordinator [phone]

## 2020-08-10 NOTE — ED Notes (Signed)
palliative care physician in to see patient

## 2020-08-10 NOTE — ED Provider Notes (Signed)
Emergency Medicine Observation Re-evaluation Note  LEOTIS ISHAM is a 70 y.o. male, seen on rounds today.  Pt initially presented to the ED for complaints of Paranoid Currently, the patient is sitting in bed watching TV in no acute distress.  Physical Exam  BP (!) 128/45 (BP Location: Right Arm)   Pulse 95   Temp 98.2 F (36.8 C) (Oral)   Resp 15   SpO2 100%  Physical Exam General: Calm and comfortable Cardiac: Regular rate Lungs: No tachypnea or respiratory distress Psych: Patient has delusions of grandeur talking about his bank account and friendship with Daisy Floro.  He is paranoid but is calm and cooperative at this time  ED Course / MDM  EKG:    I have reviewed the labs performed to date as well as medications administered while in observation.  Recent changes in the last 24 hours include patient has received some antipsychotics..  Plan  Current plan is for reevaluation by psychiatry today for further recommendations. Patient is under full IVC at this time.   Blanchie Dessert, MD 08/10/20 1016

## 2020-08-10 NOTE — ED Notes (Signed)
Patient to TCU 27 by wheelchair, patient oriented to room, belongings placed in locker. IVC paperwork obtained. Vitals obtained.

## 2020-08-10 NOTE — Consult Note (Signed)
Palliative Care Consult Note Reason: High Risk 12MM Requested by EDP for Goals of Care.  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 Since 03/2020 he has had 6 hepatic encephalopathy related visits and hospital admissions. He has continued to drink alcohol intermittently and his wife believes that his last "binge" was back in December when he had a fall at home. He had a CT that showed multiple rib fractures and lumbar fracture but she was unaware of those results and says he has been complaining of severe back pain at home.He has had progressively worsening mental status over the past 2 weeks and bizarre and erratic behavior consistent with hepatic encephalopathy and also following a period of non-adherence to medications recommended.   He was discharged by [facility] on 2/5 after the deemed him to have full capacity after which point he was discharged by Melburn Popper to the [facility] by his request. [name] was able to carry on a perfectly normal conversation as long as it involved generalities and no details. He was very articulate and at times very tangential. He was also classically confabulating. When I asked him orientation questions he diverted the question and then asked me to get him a newspaper.   He is unable to tell me how he got to the [place] or events related to his discharge from [facility]. He tells me that he "had breakfast with the owner of the [employer]" and offered to pay him 20 billion to buy the hotel from him. As he shared this story he appeared to become more entrenched in his delusion-stating that he has contact with someone who is going to build the border wall, he eluded to this person being [employer]. He also expressed paranoid thoughts including fear of terrorists, fear of being hurt in the hospital. The delusions waxed and waned in our conversation which is also typical for HE.  This patient  has no psych history prior to October after the initial onset of hepatic encephalopathy which to some degree resolved at that time, but with each episode he has not gotten back to his baseline.   Exam:  Patient is pale, his skin has a waxy appearance and his extremities are cool and dusky appearing. No obvious jaundice. He has bruises on his back, bruising and skin tears on his extremities. Collateral circulation across his chest. gynecomastia present. No palpable ascites but he has significant amounts of redundant skin possibly due to dramatic weight loss.  Patient has marked asterixis.   Mental Status:  Oriented to person and place. Cannot tell me the date or year. Names incorrect President. Cannot tell me why he is in the hospital. He reports that he has been mistreated and gets tearful talking about his experience and with my expression of empathy.  Patient makes eye contact without natural deviation. His speech is clear, but pressured at times.  His mood is calm, but cautious, slightly suspicious but he pleads his case to be allowed to go home.  His thought process is not normal. He demonstrated confabulation and avoidance of answering direct questions. He is tangential. His content is delusional.  He demonstrates short term memory loss, but can consistently give details from the past with accuracy confirmed by chart review and by his wife. He cannot provide any recent details from his hospitalizations or describe events accurately over the past 24 hours. He has no recollection of his behavior or events  that led to his current IVC. No memory of his [facility] stay.  He has paranoid thoughts about terrorists and being harmed by staff.  His insight is poor. He is unable to tell me about any of his his medical problems and reports not having liver disease at all, but then tells me he has been seen by Dr. [name] for his liver, but his liver isn't very bad.  His judgement is also impaired. He  convinced the BSW at [facility] that a safe discharge plan was to the [facility]. Where he was discharged without any medications or support. But he told them he had an AmEx with no limit and he would be fine. His judgement today remains impaired.  Decision Making Capacity:  1. Understanding:   "Can you tell me in your own words what I just said about your condition"   Patient could not give details and believed that the lab work was wrong or altered.  2. Expressing a Choice:  Based on what I have shared with you about your health and condition, are you willing to take your medications? Would you allow for additional blood work to determine for liver function?  Patient agrees to all recommendations.  3. Appreciation:  Can you tell me in your own words what you think is your current medical problem.   Patient confabulated and did not answer my question even after rephrasing and repeating. I also asked him why he was having trouble answering my question and asked him if he was he aware that he could not answer. He again did not answer but expressed his gratitude for my compassion and willingness to listen and in trying to help him.  4. Reasoning: unable to assess due to inability to focus or answer questions being asked.   Assessment:  1. Decompensated Hepatic Encephalopathy with no identifiable trigger other than patient stopped taking his medications and was without any medications after being discharged from [facility]. Previous trigger was alcohol use and med non-adherence. No prior history of psychiatric illness.  2. End Stage Liver Cirrhosis, on going ETOH use, not a liver transplant or TIPS candidate.  Class C   MELD is 25 =20% 3 month mortality  Coagulopathy with high bleed risk, recent falls  >20 kg weight loss over the past year  Albumin is 2.5  INR elevated  Bili 4.4  Prognosis: His prognosis is very poor he is at risk for bleeding and complications of terminal  cirrhosis. He has multiple concerning lab values including worsening bili and renal function. He has a high probability of death in 3 months and even high probability at 6 months. He would be appropriate for hospice referral.  Goals of Care:  I discussed goals of care with both patient (to the degree he could participate) and his wife. QOL and comfort are their primary goals and his wife understands he has a terminal illness.   Code Status: DNR, patient has a living will stating this wish and he verbally tells me he would never want to be on a life support machine and thanks me for making this clear on his chart. His wife agrees and consents to this order.  Surrogate decision maker: His wife is this patient's legal surrogate decision maker in the absence of his capacity. She has general POA. He has an AD, I have requested a copy.   Recommendations/Plan:  1. DNR 2. Family Meeting scheduled for tomorrow with his Wife,Children and Sister 3. Hospice Care at home or  in a memory care facility would be appropriate 4. He needs symptom management for agitation and pain.  Oxycodone for pain-he has severe disc disease and previous traumatic injuries  Low dose benzodiazapine for agitation given history of alcohol use, given his liver disease once daily dose will be sufficient and should not overly sedate him. 5. Managing his liver disease will be difficult given side effects of lactulose and his persistent diarrhea.  Offer meds but don't force them- he is dying regardless of the medications he takes and his QOL is poor overall.  Work up of HE triggers is optional, in some cases sedation is required to ensure comfort at EOL.  I will continue to follow and update care team following meeting tomorrow.  Lane Hacker, DO Palliative Medicine  Time: 120 minutes Time on unit: 330PM-5PM Time spent communication with care team and in chart review: 30 minutes Greater than 50%  of this time was spent  counseling and coordinating care related to the above assessment and plan.

## 2020-08-10 NOTE — ED Notes (Signed)
Per pallitive care physican patient has living will and is DNR, physician will obtain from wife

## 2020-08-10 NOTE — Progress Notes (Addendum)
TOC CM reviewed referral and spoke message from Dr Hilma Favors to discuss [place]. Waiting final consult. She will speak to pt's family. Pt needs IP psych at this time. Will follow up with family on [place] once notification received back from Dr Hilma Favors.  [place] ED TOC CM [phone]

## 2020-08-10 NOTE — BH Assessment (Addendum)
[place] Assessment Progress Note  Per Lindon Romp, NP, this pt requires psychiatric hospitalization at a facility providing specialty geriatric care at this time.  Pt presents under IVC initiated by pt's spouse and upheld by EDP Lacretia Leigh, MD.  The following facilities have been contacted to seek placement for this pt, with results as noted:  Beds available, information sent, decision pending: [facility]) [place] (for [place]) [facility]  Unable to reach: [place]  At capacity: [place] (unit currently closed) [facility] (unit currently closed)  Jalene Mullet, [place] Coordinator [phone]

## 2020-08-11 ENCOUNTER — Other Ambulatory Visit (HOSPITAL_COMMUNITY): Payer: Self-pay | Admitting: Internal Medicine

## 2020-08-11 ENCOUNTER — Telehealth: Payer: PPO

## 2020-08-11 LAB — URINE CULTURE: Culture: <10000 — AB

## 2020-08-11 MED ORDER — LORAZEPAM 2 MG/ML PO CONC: ORAL | 0 refills | Status: AC

## 2020-08-11 MED ORDER — OXYCODONE HCL 20 MG/ML PO CONC: 10.0000 mg | ORAL | 0 refills | Status: AC | PRN

## 2020-08-11 MED ORDER — LACTULOSE 10 GM/15ML PO SOLN: 10.0000 g | Freq: Two times a day (BID) | ORAL | 0 refills | Status: AC | PRN

## 2020-08-11 MED ORDER — LORAZEPAM 2 MG/ML PO CONC
0.5000 mg | ORAL | Status: DC | PRN
  Filled 2020-08-11: qty 0.25

## 2020-08-11 MED ORDER — BACITRACIN ZINC 500 UNIT/GM EX OINT
1.0000 "application " | TOPICAL_OINTMENT | Freq: Two times a day (BID) | CUTANEOUS | Status: DC
  Administered 2020-08-11 (×2): 1 via TOPICAL
  Filled 2020-08-11 (×2): qty 0.9

## 2020-08-11 MED ORDER — SPIRONOLACTONE 50 MG PO TABS: 100.0000 mg | ORAL_TABLET | Freq: Every day | ORAL | Status: AC

## 2020-08-11 MED FILL — LORAZEPAM 2 MG/ML CONC: 2 | 5 days supply | Qty: 30 | Fill #0

## 2020-08-11 MED FILL — OXYCODONE HCL 100 MG/5ML CO: 100 | 7 days supply | Qty: 30 | Fill #0

## 2020-08-11 NOTE — BH Assessment (Signed)
[place] Assessment Progress Note  Per Letitia Libra, NP, this pt does not require psychiatric hospitalization at this time.  Pt presents under IVC initiated by pt's spouse and upheld by EDP Lacretia Leigh, MD, which has been rescinded by EDP Aletta Edouard, MD.  Pt is psychiatrically cleared.  Discharge instructions include referral information for area outpatient psychiatry and therapy providers.  At 11:10 this Probation officer called pt's wife, Jameal Razzano, to discuss disposition.  I was accompanied by Ricquita Tarpley-Carter, LCSWA with the [facility] department, and [name] reports that pt's sister is on her end of the call.  Details noted above were discussed with them.  Dr Melina Copa and pt's nurse, Eustaquio Maize, have been notified.  Jalene Mullet, [place] Triage Specialist [phone]

## 2020-08-11 NOTE — Progress Notes (Signed)
Palliative Care Progress Note  Family Meeting held today by conference call with his wife, sister, son and daughter. I explained the progression of terminal liver disease and the challenges of managing hepatic encephalopathy. They all agree that our goals should be maintaining his dignity, focusing on comfort and not prolonging his life given his current QOL and end stage disease.  I have seen and evaluated this patient in person today. He still has asterixis, he is tremulous, but calm.  He does not have capacity for decision making again today. He has no insight into his condition and does not acknowledge the severity of his liver disease. He continues to have difficulty focusing on the conversation. He had difficulty recalling who I was but does have fragment memories of our conversation yesterday.  I explained to the patient that he does in fact have severe and terminal liver disease and it has affected his thinking and judgement even though he is not aware of this. I told him that I have recommended Hospice Services and that he will be managed by them once he gets home to keep him comfortable and hopefully keep him out of the hospital with the appropriate medication.  Family has significant anxiety about bringing him home because he has been so difficult to manage for the past few weeks- they are experiencing burnout. I have tried to reassure them that this discharge will be different because we are all now clear on the goals and that his primary problem is hepatic encephalopathy and will have a terminal progression.They understand that sedation may be required, although that is not our primary goal. Hopefully with medications we can keep him restful and calm, but not unresponsive.  I answered families questions and reassured them that we would assist with a safe discharge plan.  Recommendations:  1. I have completed his discharge medical reconciliation and prescribed meds for his symptom  management - Will use intensol Ativan and Oxycodone which is a better absorption via SL route in liver disease.  2. Stopped all vitamins and unnecessary medications. Simplified his regimen greatly.  3. Will see if hospice can go out and admit him to their servcie shortly after he is discharged home.   His prognosis is < 6 months, family values align with hospice philosophy.  Lane Hacker, DO Palliative Medicine  Time: 12PM-2PM 120 minutes Greater than 50%  of this time was spent counseling and coordinating care related to the above assessment and plan.

## 2020-08-11 NOTE — Discharge Instructions (Addendum)
For your behavioral health needs, you are advised to follow up with an outpatient provider.  The providers listed below offer psychiatry/medication management, as well as therapy.  Norma Fredrickson, MD, a geriatric psychiatry specialist, sees patients at both locations:       [facility] [address] [place], [place] [address]      [phone]       [facility]      [address] [place] [address]      [place], [place] [address]      [phone]

## 2020-08-11 NOTE — ED Provider Notes (Signed)
Patient's family is here to take him home.  Palliative care has already sent prescriptions to pharmacy.   Sherwood Gambler, MD 08/11/20 [phone]

## 2020-08-11 NOTE — Consult Note (Signed)
Telepsych Consultation   Reason for Consult: Psychiatry provider reassessment Referring Physician: [facility] physician [facility] of Patient: [facility] Location of Provider: [facility]  Patient Identification: Timothy Mckinney MRN:  [id number] Principal Diagnosis: <principal problem not specified> Diagnosis:  Active Problems:   * No active hospital problems. *   Total Time spent with patient: 30 minutes  Subjective:   Timothy Mckinney is a 70 y.o. male patient.  Patient states "I have stopped all the alcohol use, I feel that I am a good shape and I have lost a lot of weight, I feel better."  HPI:   Patient assessed by nurse practitioner.  Patient alert to self and situation.  Patient not able to articulate date or time. Patient pleasant and cooperative during assessment, continues to be engaged and participate with this Probation officer.  Patient lacking some insight regarding diagnosis.  Patient states "I do not believe that I have dementia."  Patient reports concern regarding feeling of dizziness at times and recent falls x3.  Patient denies any suicidal or homicidal ideations.  Patient denies any auditory or visual hallucinations.  Attempted to discuss treatment plan including recommendations of palliative care.  Patient receptive to this conversation states "I have been treated well at the hospital and I am ready to go home." Patient reports he would be interested in following up with outpatient psychiatry "at some point."  Patient offered support and encouragement.  Patient gives verbal consent to speak with his wife [name] phone number [phone].  HIPAA compliant voicemail left.   Past Psychiatric History: Alcohol use disorder  Risk to Self:  Denies Risk to Others:  Denies Prior Inpatient Therapy:  None reported Prior Outpatient Therapy:  None reported  Past Medical History:  Past Medical History:  Diagnosis Date   . Abnormal chest x-ray    ? granulomata  . Arthritis    knees and back   . DDD (degenerative disc disease), lumbar    MRI 08/2005  . Dry eyes, bilateral    Dr Bing Plume  . Elevated liver enzymes    HISTORY OF ELEVATED LIVER ENZYMES  . GERD (gastroesophageal reflux disease)    hx of   . Hypertension    NEW DIAGNOSIS AND STARTED ON METOPROLOL ON 10/04/11  . Hyperuricemia   . 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 Nephrolithiasis    Dr Serita Butcher  . Sleep apnea    STOP BANG SCORE OF 4  . Thrombocytopenia ([place])    HX OF LOW PLATELET COUNT    Past Surgical History:  Procedure Laterality Date  . ANAL FISTULECTOMY  06/01/2012   Procedure: FISTULECTOMY ANAL;  Surgeon: Leighton Ruff, MD;  Location: [facility];  Service: General;  Laterality: N/A;  Mucosal Advancement Flap   . BIOPSY  04/21/2020   Procedure: BIOPSY;  Surgeon: Carol Ada, MD;  Location: [facility];  Service: Endoscopy;;  . CARDIAC CATHETERIZATION     in 40s, negative  . colon ulcer  2011  . COLONOSCOPY W/ POLYPECTOMY  2011   Dr Sharlett Iles  . ESOPHAGOGASTRODUODENOSCOPY (EGD) WITH PROPOFOL N/A 04/21/2020   Procedure: ESOPHAGOGASTRODUODENOSCOPY (EGD) WITH PROPOFOL;  Surgeon: Carol Ada, MD;  [facility];  Service: Endoscopy;  Laterality: N/A;  . ESOPHAGOGASTRODUODENOSCOPY (EGD) WITH PROPOFOL N/A 06/09/2020   Procedure: ESOPHAGOGASTRODUODENOSCOPY (EGD) WITH PROPOFOL;  Surgeon: Carol Ada, MD;  [facility];  Service: Endoscopy;  Laterality: N/A;  . HOT HEMOSTASIS N/A 06/09/2020   Procedure: HOT HEMOSTASIS (ARGON PLASMA COAGULATION/BICAP);  Surgeon: Carol Ada, MD;  [facility];  Service: Endoscopy;  Laterality: N/A;  . KNEE ARTHROSCOPY  2009    L  . KNEE SURGERY     april 2013 total knee replacement removed  . LITHOTRIPSY    . tear duct plugs    . TOTAL KNEE ARTHROPLASTY  2011   Workman's Comp case, Dr Veverly Fells   Family History:  Family History  Problem Relation Age of Onset   . Heart attack Father 42  . Colon polyps Mother   . Dementia Mother        died post SDH  . Cancer Paternal Grandfather        throat , pipe smoker  . Colon polyps Sister   . Cancer Sister        Colon, stage 2  . Hearing loss Brother   . Stroke Neg Hx    Family Psychiatric  History: None reported Social History:  Social History   Substance and Sexual Activity  Alcohol Use Yes   Comment: none in 3 months, prior 4 to 5 times per week  with heavy use     Social History   Substance and Sexual Activity  Drug Use No    Social History   Socioeconomic History  . Marital status: Married    Spouse name: [name]  . Number of children: Not on file  . Years of education: Not on file  . Highest education level: Not on file  Occupational History  . Occupation: retired Hotel manager  Tobacco Use  . Smoking status: Never Smoker  . Smokeless tobacco: Never Used  Vaping Use  . Vaping Use: Never used  Substance and Sexual Activity  . Alcohol use: Yes    Comment: none in 3 months, prior 4 to 5 times per week  with heavy use  . Drug use: No  . Sexual activity: Not on file  Other Topics Concern  . Not on file  Social History Narrative  . Not on file   Social Determinants of Health   Financial Resource Strain: Not on file  Food Insecurity: No Food Insecurity  . Worried About Charity fundraiser in the Last Year: Never true  . Ran Out of Food in the Last Year: Never true  Transportation Needs: No Transportation Needs  . Lack of Transportation (Medical): No  . Lack of Transportation (Non-Medical): No  Physical Activity: Not on file  Stress: Not on file  Social Connections: Not on file   Additional Social History:    Allergies:   Allergies  Allergen Reactions  . Penicillins Swelling    Diffuse swelling    Labs:  Results for orders placed or performed during the hospital encounter of 08/09/20 (from the past 48 hour(s))  Ethanol     Status: None   Collection Time: 08/09/20   5:55 PM  Result Value Ref Range   Alcohol, Ethyl (B) <10 <10 mg/dL    Comment: (NOTE) Lowest detectable limit for serum alcohol is 10 mg/dL.  For medical purposes only. Performed at [facility], [place] [address] [place], [place] [address]   Rapid urine drug screen (hospital performed)     Status: Abnormal   Collection Time: 08/09/20  5:55 PM  Result Value Ref Range   Opiates NONE DETECTED NONE DETECTED   Cocaine NONE DETECTED NONE DETECTED   Benzodiazepines POSITIVE (A) NONE DETECTED   Amphetamines NONE DETECTED NONE DETECTED   Tetrahydrocannabinol NONE DETECTED NONE DETECTED   Barbiturates  NONE DETECTED NONE DETECTED    Comment: (NOTE) DRUG SCREEN FOR MEDICAL PURPOSES ONLY.  IF CONFIRMATION IS NEEDED FOR ANY PURPOSE, NOTIFY LAB WITHIN 5 DAYS.  LOWEST DETECTABLE LIMITS FOR URINE DRUG SCREEN Drug Class                     Cutoff (ng/mL) Amphetamine and metabolites    1000 Barbiturate and metabolites    200 Benzodiazepine                 401 Tricyclics and metabolites     300 Opiates and metabolites        300 Cocaine and metabolites        300 THC                            50 Performed at [facility], [place] [address] [place], [place] [address]   Salicylate level     Status: Abnormal   Collection Time: 08/09/20  5:55 PM  Result Value Ref Range   Salicylate Lvl <3.6 (L) 7.0 - 30.0 mg/dL    Comment: Performed at [facility], [place] [address], [place], [place] [address]  Acetaminophen level     Status: Abnormal   Collection Time: 08/09/20  5:55 PM  Result Value Ref Range   Acetaminophen (Tylenol), Serum <10 (L) 10 - 30 ug/mL    Comment: (NOTE) Therapeutic concentrations vary significantly. A range of 10-30 ug/mL  may be an effective concentration for many patients. However, some  are best treated at concentrations outside of this range. Acetaminophen concentrations >150 ug/mL at 4 hours after ingestion  and >50  ug/mL at 12 hours after ingestion are often associated with  toxic reactions.  Performed at [facility], [place] [address] [place], [place] [address]   CBC with Differential/Platelet     Status: Abnormal   Collection Time: 08/09/20  5:55 PM  Result Value Ref Range   WBC 7.9 4.0 - 10.5 K/uL   RBC 2.83 (L) 4.22 - 5.81 MIL/uL   Hemoglobin 9.1 (L) 13.0 - 17.0 g/dL   HCT 27.9 (L) 39.0 - 52.0 %   MCV 98.6 80.0 - 100.0 fL   MCH 32.2 26.0 - 34.0 pg   MCHC 32.6 30.0 - 36.0 g/dL   RDW 17.2 (H) 11.5 - 15.5 %   Platelets 72 (L) 150 - 400 K/uL    Comment: Immature Platelet Fraction may be clinically indicated, consider ordering this additional test [id number]    nRBC 0.0 0.0 - 0.2 %   Neutrophils Relative % 76 %   Neutro Abs 6.1 1.7 - 7.7 K/uL   Lymphocytes Relative 11 %   Lymphs Abs 0.9 0.7 - 4.0 K/uL   Monocytes Relative 11 %   Monocytes Absolute 0.8 0.1 - 1.0 K/uL   Eosinophils Relative 1 %   Eosinophils Absolute 0.1 0.0 - 0.5 K/uL   Basophils Relative 0 %   Basophils Absolute 0.0 0.0 - 0.1 K/uL   Immature Granulocytes 1 %   Abs Immature Granulocytes 0.04 0.00 - 0.07 K/uL    Comment: Performed at [facility], [place] [address] [place], [place] [address]  Comprehensive metabolic panel     Status: Abnormal   Collection Time: 08/09/20  5:55 PM  Result Value Ref Range   Sodium 136 135 - 145 mmol/L   Potassium 3.9 3.5 - 5.1 mmol/L   Chloride  102 98 - 111 mmol/L   CO2 23 22 - 32 mmol/L   Glucose, Bld 127 (H) 70 - 99 mg/dL    Comment: Glucose reference range applies only to samples taken after fasting for at least 8 hours.   BUN 26 (H) 8 - 23 mg/dL   Creatinine, Ser 1.12 0.61 - 1.24 mg/dL   Calcium 9.0 8.9 - 10.3 mg/dL   Total Protein 5.8 (L) 6.5 - 8.1 g/dL   Albumin 3.0 (L) 3.5 - 5.0 g/dL   AST 49 (H) 15 - 41 U/L   ALT 35 0 - 44 U/L   Alkaline Phosphatase 136 (H) 38 - 126 U/L   Total Bilirubin 4.4 (H) 0.3 - 1.2 mg/dL   GFR, Estimated >60 >60  mL/min    Comment: (NOTE) Calculated using the CKD-EPI Creatinine Equation (2021)    Anion gap 11 5 - 15    Comment: Performed at [facility], [place] [address], [place], [place] [address]  Ammonia     Status: None   Collection Time: 08/09/20  5:55 PM  Result Value Ref Range   Ammonia 22 9 - 35 umol/L    Comment: Performed at [facility], [place] [address] [place], [place] [address]  Urinalysis, Routine w reflex microscopic Urine, Clean Catch     Status: Abnormal   Collection Time: 08/09/20  6:29 PM  Result Value Ref Range   Color, Urine AMBER (A) YELLOW    Comment: BIOCHEMICALS MAY BE AFFECTED BY COLOR   APPearance CLEAR CLEAR   Specific Gravity, Urine 1.021 1.005 - 1.030   pH 5.0 5.0 - 8.0   Glucose, UA NEGATIVE NEGATIVE mg/dL   Hgb urine dipstick MODERATE (A) NEGATIVE   Bilirubin Urine NEGATIVE NEGATIVE   Ketones, ur NEGATIVE NEGATIVE mg/dL   Protein, ur NEGATIVE NEGATIVE mg/dL   Nitrite NEGATIVE NEGATIVE   Leukocytes,Ua NEGATIVE NEGATIVE   RBC / HPF 21-50 0 - 5 RBC/hpf   WBC, UA 0-5 0 - 5 WBC/hpf   Bacteria, UA NONE SEEN NONE SEEN   Squamous Epithelial / LPF 0-5 0 - 5   Mucus PRESENT    Hyaline Casts, UA PRESENT     Comment: Performed at [facility], [place] [address] [place], [place] [address]  Urine Culture     Status: Abnormal   Collection Time: 08/09/20  6:29 PM   Specimen: Urine, Random  Result Value Ref Range   Specimen Description      URINE, RANDOM Performed at [place] [address] [place], [place] [address]    Special Requests      NONE Performed at [facility], [place] [address] [place], [place] [address]    Culture (A)     <10,000 COLONIES/mL INSIGNIFICANT GROWTH Performed at [place] [address], [place], [place] [address]    Report Status 08/11/2020 FINAL   Resp Panel by RT-PCR (Flu A&B, Covid) Urine, Clean Catch     Status: None    Collection Time: 08/09/20  6:30 PM   Specimen: Urine, Clean Catch; Nasopharyngeal(NP) swabs in vial transport medium  Result Value Ref Range   SARS Coronavirus 2 by RT PCR NEGATIVE NEGATIVE    Comment: (NOTE) SARS-CoV-2 target nucleic acids are NOT DETECTED.  The SARS-CoV-2 RNA is generally detectable in upper respiratory specimens during the acute phase of infection. The lowest concentration of SARS-CoV-2 viral copies this assay can detect is 138 copies/mL. A negative  result does not preclude SARS-Cov-2 infection and should not be used as the sole basis for treatment or other patient management decisions. A negative result may occur with  improper specimen collection/handling, submission of specimen other than nasopharyngeal swab, presence of viral mutation(s) within the areas targeted by this assay, and inadequate number of viral copies(<138 copies/mL). A negative result must be combined with clinical observations, patient history, and epidemiological information. The expected result is Negative.  Fact Sheet for Patients:  EntrepreneurPulse.com.au  Fact Sheet for Healthcare Providers:  IncredibleEmployment.be  This test is no t yet approved or cleared by the [place] FDA and  has been authorized for detection and/or diagnosis of SARS-CoV-2 by FDA under an Emergency Use Authorization (EUA). This EUA will remain  in effect (meaning this test can be used) for the duration of the COVID-19 declaration under Section 564(b)(1) of the Act, 21 U.S.C.section 360bbb-3(b)(1), unless the authorization is terminated  or revoked sooner.       Influenza A by PCR NEGATIVE NEGATIVE   Influenza B by PCR NEGATIVE NEGATIVE    Comment: (NOTE) The Xpert Xpress SARS-CoV-2/FLU/RSV plus assay is intended as an aid in the diagnosis of influenza from Nasopharyngeal swab specimens and should not be used as a sole basis for treatment. Nasal washings and aspirates  are unacceptable for Xpert Xpress SARS-CoV-2/FLU/RSV testing.  Fact Sheet for Patients: EntrepreneurPulse.com.au  Fact Sheet for Healthcare Providers: IncredibleEmployment.be  This test is not yet approved or cleared by the [place] FDA and has been authorized for detection and/or diagnosis of SARS-CoV-2 by FDA under an Emergency Use Authorization (EUA). This EUA will remain in effect (meaning this test can be used) for the duration of the COVID-19 declaration under Section 564(b)(1) of the Act, 21 U.S.C. section 360bbb-3(b)(1), unless the authorization is terminated or revoked.  Performed at [facility], [place] [address] [place], [place] [address]     Medications:  Current Facility-Administered Medications  Medication Dose Route Frequency Provider Last Rate Last Admin  . acetaminophen (TYLENOL) tablet 500 mg  500 mg Oral Q6H PRN Lacretia Leigh, MD      . bacitracin ointment 1 application  1 application Topical BID Muthersbaugh, Jarrett Soho, PA-C   1 application at 90/24/09 1002  . cycloSPORINE (RESTASIS) 0.05 % ophthalmic emulsion 1 drop  1 drop Both Eyes Daily Lacretia Leigh, MD   1 drop at 08/11/20 1005  . fluticasone (FLONASE) 50 MCG/ACT nasal spray 1 spray  1 spray Each Nare Daily PRN Lacretia Leigh, MD      . folic acid (FOLVITE) tablet 1 mg  1 mg Oral Daily Lacretia Leigh, MD   1 mg at 08/11/20 1002  . furosemide (LASIX) tablet 40 mg  40 mg Oral Daily Lacretia Leigh, MD   40 mg at 08/11/20 1003  . lactulose (CHRONULAC) 10 GM/15ML solution 30 g  30 g Oral BID Lacretia Leigh, MD   30 g at 08/11/20 1006  . loratadine (CLARITIN) tablet 10 mg  10 mg Oral Daily Lacretia Leigh, MD   10 mg at 08/11/20 1003  . LORazepam (ATIVAN) 2 MG/ML concentrated solution 0.5 mg  0.5 mg Oral QHS Lane Hacker L, DO   0.5 mg at 08/10/20 2057  . metoprolol succinate (TOPROL-XL) 24 hr tablet 12.5 mg  12.5 mg Oral Daily Lacretia Leigh, MD    12.5 mg at 08/11/20 1002  . oxyCODONE (ROXICODONE INTENSOL) 20 MG/ML concentrated solution 5 mg  5 mg Oral Q2H PRN Acquanetta Chain, DO      .  pantoprazole (PROTONIX) EC tablet 40 mg  40 mg Oral Daily Lacretia Leigh, MD   40 mg at 08/11/20 1002  . potassium chloride SA (KLOR-CON) CR tablet 20 mEq  20 mEq Oral Daily Lacretia Leigh, MD   20 mEq at 08/11/20 1002  . rifaximin (XIFAXAN) tablet 550 mg  550 mg Oral BID Lacretia Leigh, MD   550 mg at 08/11/20 1003  . spironolactone (ALDACTONE) tablet 75 mg  75 mg Oral Daily Lacretia Leigh, MD   75 mg at 08/11/20 1003  . sucralfate (CARAFATE) tablet 1 g  1 g Oral TID WC & HS Lacretia Leigh, MD   1 g at 08/11/20 1004  . thiamine tablet 100 mg  100 mg Oral Daily Lacretia Leigh, MD   100 mg at 08/11/20 1003  . zinc sulfate capsule 220 mg  220 mg Oral Daily Lacretia Leigh, MD   220 mg at 08/11/20 1004   Current Outpatient Medications  Medication Sig Dispense Refill  . acetaminophen (TYLENOL) 500 MG tablet Take 500 mg by mouth every 6 (six) hours as needed for moderate pain.    . cycloSPORINE (RESTASIS) 0.05 % ophthalmic emulsion Place 1 drop into both eyes daily.    . folic acid (FOLVITE) 1 MG tablet Take 1 tablet (1 mg total) by mouth daily. 90 tablet 1  . furosemide (LASIX) 40 MG tablet Take 1 tablet (40 mg total) by mouth daily. 90 tablet 1  . lactulose (CHRONULAC) 10 GM/15ML solution Take 15 mLs (10 g total) by mouth 2 (two) times daily. (Patient taking differently: Take 30 g by mouth 2 (two) times daily.) 236 mL 0  . levocetirizine (XYZAL) 5 MG tablet Take 1 tablet (5 mg total) by mouth every evening. 90 tablet 1  . metoprolol succinate (TOPROL-XL) 25 MG 24 hr tablet TAKE 1 TABLET(25 MG) BY MOUTH DAILY (Patient taking differently: Take 12.5 mg by mouth daily.) 90 tablet 0  . Multiple Vitamins-Minerals (MULTIVITAMIN ADULT EXTRA C PO) Take 1 tablet by mouth daily.    . pantoprazole (PROTONIX) 40 MG tablet Take 1 tablet (40 mg total) by mouth 2 (two)  times daily. (Patient taking differently: Take 40 mg by mouth daily.) 180 tablet 1  . potassium chloride SA (KLOR-CON) 20 MEQ tablet Take 1 tablet (20 mEq total) by mouth daily. 30 tablet 6  . spironolactone (ALDACTONE) 50 MG tablet Take 1 tablet (50 mg total) by mouth daily. (Patient taking differently: Take 75 mg by mouth daily.)    . thiamine 100 MG tablet Take 1 tablet (100 mg total) by mouth daily.    Marland Kitchen XIFAXAN 550 MG TABS tablet Take 1 tablet (550 mg total) by mouth 2 (two) times daily. 180 tablet 0  . zinc gluconate 50 MG tablet Take 50 mg by mouth daily.    . fluticasone (FLONASE) 50 MCG/ACT nasal spray Place 1 spray into both nostrils daily as needed for allergies or rhinitis.      Musculoskeletal: Strength & Muscle Tone: decreased Gait & Station: unsteady Patient leans: N/A  Psychiatric Specialty Exam: Physical Exam Vitals and nursing note reviewed.  Constitutional:      Appearance: He is well-developed.  HENT:     Head: Normocephalic.  Cardiovascular:     Rate and Rhythm: Normal rate.  Pulmonary:     Effort: Pulmonary effort is normal.  Neurological:     Mental Status: He is alert. Mental status is at baseline.  Psychiatric:        Attention and Perception:  Attention and perception normal.        Mood and Affect: Mood and affect normal.        Speech: Speech normal.        Behavior: Behavior normal. Behavior is cooperative.        Cognition and Memory: Cognition is impaired. Memory is impaired.     Review of Systems  Constitutional: Negative.   HENT: Negative.   Eyes: Negative.   Respiratory: Negative.   Cardiovascular: Negative.   Gastrointestinal: Negative.   Genitourinary: Negative.   Musculoskeletal: Positive for gait problem and myalgias.  Skin: Negative.   Psychiatric/Behavioral: Positive for confusion.    Blood pressure (!) 121/54, pulse 75, temperature 98.1 F (36.7 C), temperature source Oral, resp. rate 16, SpO2 100 %.There is no height or weight  on file to calculate BMI.  General Appearance: Casual  Eye Contact:  Good  Speech:  Clear and Coherent and Normal Rate  Volume:  Normal  Mood:  Euthymic  Affect:  Congruent  Thought Process:  Goal Directed and Descriptions of Associations: Intact  Orientation:  Other:  peron,  situation  Thought Content:  Logical  Suicidal Thoughts:  No  Homicidal Thoughts:  No  Memory:  Immediate;   Poor Recent;   Poor Remote;   Poor  Judgement:  Impaired  Insight:  Lacking  Psychomotor Activity:  Normal  Concentration:  Concentration: Fair  Recall:  Poor  Fund of Knowledge:  Poor  Language:  Fair  Akathisia:  No  Handed:  Right  AIMS (if indicated):     Assets:  Communication Skills Desire for Improvement Financial Resources/Insurance Housing Intimacy Leisure Time Social Support  ADL's:  Impaired  Cognition:  Impaired,  Moderate  Sleep:        Treatment Plan Summary: Plan Patient reviewed with Dr. Serafina Mitchell.  Patient cleared by psychiatry, social work consult in place. Outpatient psychiatry resources provided per patient request.  Disposition: No evidence of imminent risk to self or others at present.   Patient does not meet criteria for psychiatric inpatient admission. Supportive therapy provided about ongoing stressors.  This service was provided via telemedicine using a 2-way, interactive audio and video technology.  Names of all persons participating in this telemedicine service and their role in this encounter. Name: Timothy Mckinney Role: Patient  Name: Letitia Libra Role: FNP  Name: Dr. Serafina Mitchell Role: Psychiatry    Emmaline Kluver, FNP 08/11/2020 10:49 AM

## 2020-08-11 NOTE — TOC Initial Note (Signed)
Transition of Care [facility]) - Initial/Assessment Note    Patient Details  Name: Timothy Mckinney MRN: [id number] Date of Birth: 03-08-1951  Transition of Care [facility]) CM/SW Contact:    Erenest Rasher, RN Phone Number: [phone] 08/11/2020, 3:37 PM  Clinical Narrative:                 TOC CM received confirmation from Palliative MD. Wife has decided with [facility]. Contacted Hospice with new referral. Will need PTAR transportation home and Gold DNR form. Will send message to ED provider with [facility] has delivered DME to wife's home.   Expected Discharge Plan: Home w Hospice Care Barriers to Discharge: ED DME delivery   Patient Goals and CMS Choice Patient states their goals for this hospitalization and ongoing recovery are:: wife is requesting Home Hospice CMS Medicare.gov Compare Post Acute Care list provided to:: Patient Represenative (must comment) (wife-Donna Grenier) Choice offered to / list presented to : Spouse  Expected Discharge Plan and Services Expected Discharge Plan: Home w Hospice Care In-house Referral: Clinical Social Work Discharge Planning Services: CM Consult Post Acute Care Choice: Hospice Living arrangements for the past 2 months: Single Family Home                                      Prior Living Arrangements/Services Living arrangements for the past 2 months: Single Family Home Lives with:: Spouse Patient language and need for interpreter reviewed:: Yes Do you feel safe going back to the place where you live?: Yes      Need for Family Participation in Patient Care: Yes (Comment) Care giver support system in place?: Yes (comment)   Criminal Activity/Legal Involvement Pertinent to Current Situation/Hospitalization: No - Comment as needed  Activities of Daily Living      Permission Sought/Granted Permission sought to share information with : Case Manager,PCP,Facility Contact Representative,Family Supports Permission granted to  share information with : Yes, Verbal Permission Granted  Share Information with NAME: Jemel Ono  Permission granted to share info w AGENCY: [place] granted to share info w Relationship: wife  Permission granted to share info w Contact Information: [phone]  Emotional Assessment           Psych Involvement: No (comment)  Admission diagnosis:  Pysch Evaluation Patient Active Problem List   Diagnosis Date Noted  . Abnormal weight loss 08/04/2020  . Alcoholic cirrhosis ([place]) 76/16/0737  . Change in bowel habit 08/04/2020  . Cholelithiasis 08/04/2020  . Family history of malignant neoplasm of digestive organ 08/04/2020  . Fecal urgency 08/04/2020  . Flatulence, eructation and gas pain 08/04/2020  . Hepatic failure ([place]) 08/04/2020  . Incontinence of feces 08/04/2020  . Morbid obesity ([place]) 08/04/2020  . Personal history of colonic polyps 08/04/2020  . Portal hypertension ([place]) 08/04/2020  . Thrombocytopenia ([place]) 08/04/2020  . Delirium due to general medical condition 08/03/2020  . Hepatic encephalopathy ([place]) 07/31/2020  . AMS (altered mental status) 06/05/2020  . A-fib ([place]) 05/22/2020  . Duodenal ulcer 05/22/2020  . Encephalopathy 05/22/2020  . Blood loss anemia 05/22/2020  . Thyroiditis, unspecified 05/22/2020  . Edema 01/20/2019  . Coagulopathy ([place]) 07/06/2018  . Kidney stones 07/06/2018  . Lower extremity edema 04/30/2018  . Folate deficiency 02/25/2018  . Chronic low back pain 02/11/2018  . 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 Other pancytopenia ([place]) 07/16/2017  .  PND (post-nasal drip) 06/17/2016  . Generalized anxiety disorder 06/13/2015  . Physical exam 12/09/2014  . Sarcoidosis ([place]) 11/09/2014  . Multiple pulmonary nodules 11/09/2014  . Hilar adenopathy 11/09/2014  . Knee pain   . Pulmonary nodules with adenopathy (since 2001 CT Chest) 10/15/2014  . Obesity (BMI 30-39.9) 08/05/2014  . Hypertension 08/05/2014  . S/P left  knee revision / reimplantation 12/16/2011  . 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 TRANSAMINASES, SERUM, ELEVATED 08/15/2009  . GERD (gastroesophageal reflux disease) 06/12/2009  . NEPHROLITHIASIS, HX OF 06/12/2009  . Hyperuricemia 06/12/2009  . CHEST XRAY, ABNORMAL 06/12/2009   PCP:  [name], MD Pharmacy:   [facility] [phone] [address] [place], [place] [address] [place] [address] Phone: [phone] Fax: [place] [id number] [place], [place] AT [place] [address] [place] [address] Phone: [phone] Fax: [phone]  [place], [place] - [place] [address] Phone: [phone] Fax: [phone]     Social Determinants of Health (SDOH) Interventions    Readmission Risk Interventions No flowsheet data found.

## 2020-08-11 NOTE — Progress Notes (Signed)
Received call from wife, states she is ready to pick up pt. [place] while wife was on phone. TOC CM spoke to [facility], [facility] to follow up on referral. She will give wife this evening to complete referral and discuss a start of care for tonight or tomorrow. Wife states she does not need DME. Wife states she will pick up pt meds prior to coming to pick up pt. ED provider and ED RN made aware wife is providing transportation. [place], [place] ED TOC CM [phone]

## 2020-08-11 NOTE — ED Provider Notes (Signed)
Emergency Medicine Observation Re-evaluation Note  Timothy Mckinney is a 70 y.o. male, seen on rounds today.  Pt initially presented to the ED for complaints of Paranoid Currently, the patient is sitting in bed getting ready for shower.  Physical Exam  BP (!) 110/52 (BP Location: Right Arm)   Pulse 74   Temp 98.5 F (36.9 C) (Oral)   Resp 18   SpO2 100%  Physical Exam General: No acute distress Cardiac: Well perfused Lungs: Nonlabored Psych: Redirectable  ED Course / MDM  EKG:EKG Interpretation  Date/Time:  Thursday August 10 2020 10:57:03 EST Ventricular Rate:  103 PR Interval:    QRS Duration: 99 QT Interval:  478 QTC Calculation: 559 R Axis:   2 Text Interpretation: Atrial fibrillation Paired ventricular premature complexes Low voltage, precordial leads Nonspecific repol abnormality, diffuse leads recurrent Prolonged QT interval No significant change since last tracing Confirmed by Blanchie Dessert [phone]) on 08/10/2020 11:13:48 AM  Clinical Course as of 08/11/20 0730  Fri Aug 11, 2020  0104 Pt concerned about his skin tears. No signs of infection.  Bacitracin ordered. [HM]    Clinical Course User Index [HM] Muthersbaugh, Gwenlyn Perking   I have reviewed the labs performed to date as well as medications administered while in observation.  Recent changes in the last 24 hours include TTS evaluation.  Patient is also been seen by palliative  Plan  Current plan is for [facility] psych placement.. Patient is under full IVC at this time.  9:45 AM-I was informed that patient is psychiatrically cleared and they are recommending removal of the IVC.  I have signed that form.  Both psychiatry and TOC plan to discuss with wife his pending discharge and encouraged her to seek out placement in the memory care unit.   Hayden Rasmussen, MD 08/12/20 [phone]

## 2020-08-11 NOTE — ED Notes (Signed)
Pt DC off unit to home per provider. Pt alert, calm, cooperative. DC information given to pt and family . Belongings given to pt. Pt off unit in w/c, escorted by NT. Pt transported by family.

## 2020-08-12 DIAGNOSIS — K7460 Unspecified cirrhosis of liver: Secondary | ICD-10-CM | POA: Diagnosis not present

## 2020-08-12 DIAGNOSIS — I4891 Unspecified atrial fibrillation: Secondary | ICD-10-CM | POA: Diagnosis not present

## 2020-08-18 ENCOUNTER — Telehealth: Payer: Self-pay | Admitting: Family Medicine

## 2020-08-18 ENCOUNTER — Other Ambulatory Visit (HOSPITAL_COMMUNITY): Payer: Self-pay | Admitting: Family Medicine

## 2020-08-18 MED FILL — SERTRALINE HCL 25 MG TABLET: 25 | 14 days supply | Qty: 14 | Fill #0

## 2020-08-18 MED FILL — HALOPERIDOL 1 MG TABLET: 1 | 5 days supply | Qty: 30 | Fill #0

## 2020-08-18 NOTE — Telephone Encounter (Signed)
Left message for patient to schedule Annual Wellness Visit.  Please schedule with Nurse Health Advisor Martha Stanley, RN at [place]  

## 2020-08-21 ENCOUNTER — Other Ambulatory Visit (HOSPITAL_COMMUNITY): Payer: Self-pay | Admitting: Hospice and Palliative Medicine

## 2020-08-21 MED FILL — POTASSIUM CL ER 10 MEQ TAB: 10 | 15 days supply | Qty: 30 | Fill #0

## 2020-08-21 MED FILL — METOPROLOL TARTRATE 25 MG T: 25 | 30 days supply | Qty: 15 | Fill #0

## 2020-08-25 ENCOUNTER — Telehealth: Payer: Self-pay | Admitting: Family Medicine

## 2020-08-25 ENCOUNTER — Other Ambulatory Visit (HOSPITAL_COMMUNITY): Payer: Self-pay | Admitting: Hospice and Palliative Medicine

## 2020-08-25 ENCOUNTER — Telehealth: Payer: PPO

## 2020-08-25 MED FILL — oxyCODONE HCL 10 MG TABS: 10 | 15 days supply | Qty: 45 | Fill #0

## 2020-08-25 NOTE — Telephone Encounter (Signed)
FYI - patient is now under hospice care per wife.

## 2020-08-25 NOTE — Telephone Encounter (Signed)
FYI

## 2020-08-28 ENCOUNTER — Other Ambulatory Visit (HOSPITAL_COMMUNITY): Payer: Self-pay | Admitting: Hospice and Palliative Medicine

## 2020-08-28 MED FILL — XIFAXAN 550 MG TABLET: 550 | 15 days supply | Qty: 30 | Fill #0

## 2020-08-31 ENCOUNTER — Other Ambulatory Visit (HOSPITAL_COMMUNITY): Payer: Self-pay | Admitting: Hospice and Palliative Medicine

## 2020-08-31 MED FILL — QUETIAPINE FUMARATE 25 MG T: 25 | 15 days supply | Qty: 15 | Fill #0

## 2020-09-01 ENCOUNTER — Encounter: Payer: PPO | Admitting: Family Medicine

## 2020-09-05 ENCOUNTER — Other Ambulatory Visit (HOSPITAL_COMMUNITY): Payer: Self-pay | Admitting: Hospice and Palliative Medicine

## 2020-09-05 MED FILL — ATROPINE 1% EYE DROPS: 1 | 4 days supply | Qty: 5 | Fill #0

## 2020-09-06 ENCOUNTER — Other Ambulatory Visit (HOSPITAL_COMMUNITY): Payer: Self-pay | Admitting: Hospice and Palliative Medicine

## 2020-09-06 MED FILL — LORAZEPAM 0.5 MG TABS: 0.5 | 4 days supply | Qty: 20 | Fill #0

## 2020-09-08 ENCOUNTER — Telehealth: Payer: PPO

## 2020-09-14 ENCOUNTER — Telehealth: Payer: Self-pay | Admitting: *Deleted

## 2020-09-14 NOTE — Telephone Encounter (Signed)
Dr. Birdie Riddle completed pt's death certificate via [employer] web site. Called Mr. Milta Deiters at the [employer] at [phone] and confirmed that it went thru.

## 2020-09-29 DEATH — deceased

## 2020-12-05 ENCOUNTER — Other Ambulatory Visit: Payer: PPO

## 2020-12-05 ENCOUNTER — Ambulatory Visit: Payer: PPO | Admitting: Hematology and Oncology

## 2020-12-07 ENCOUNTER — Ambulatory Visit: Payer: PPO | Admitting: Cardiology

## 2022-02-02 IMAGING — CT CT HEAD W/O CM
3 series · 16 of 47 positions shown, 19 images · non-contrast
Comparison: None.

CLINICAL DATA: Confusion, fall a few months ago.

EXAM:
CT HEAD WITHOUT CONTRAST
TECHNIQUE: Contiguous axial images were obtained from the base of the skull
through the vertex without intravenous contrast.

[Series 2: head wo · axial · 0.44mm/px · z∈[+1264,+1409]mm · 10 of 35 slices shown, 13 images]
[im 3/35  brain]
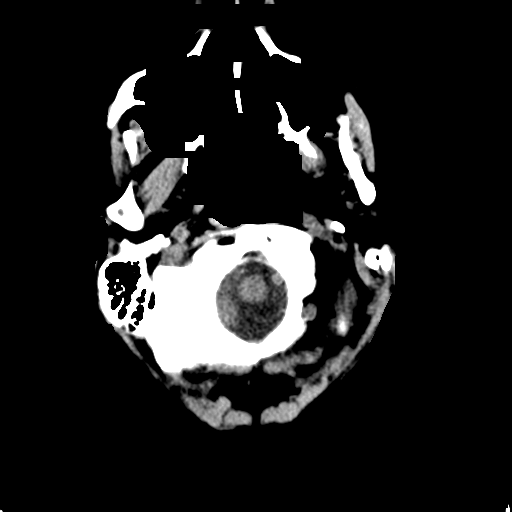
[im 3/35  bone]
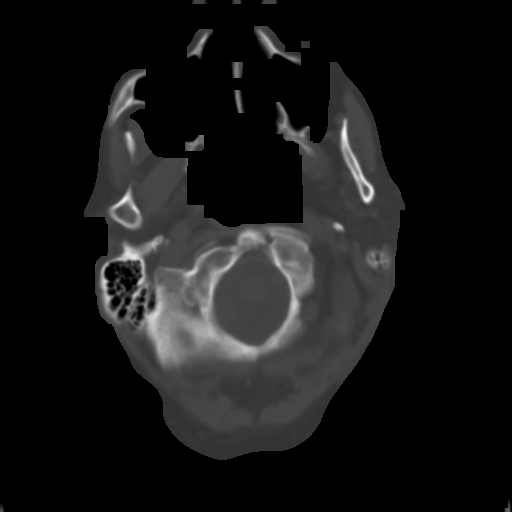
[im 6/35  brain]
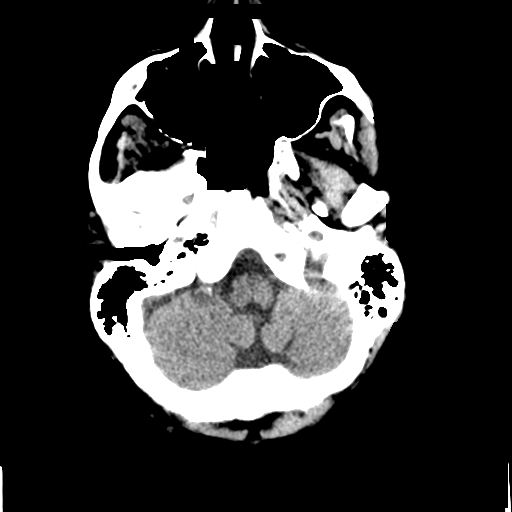
[im 10/35  brain]
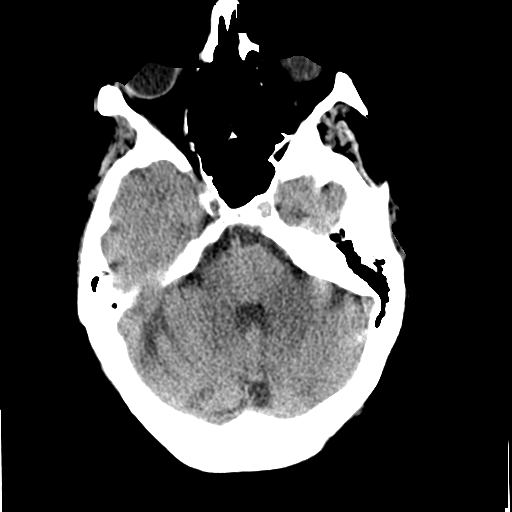
[im 12/35  brain]
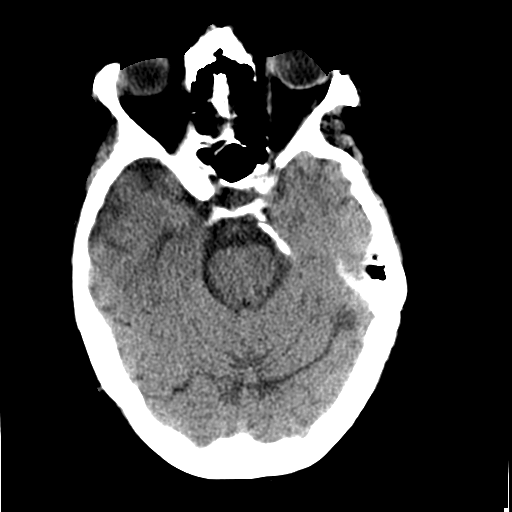
[im 16/35  brain]
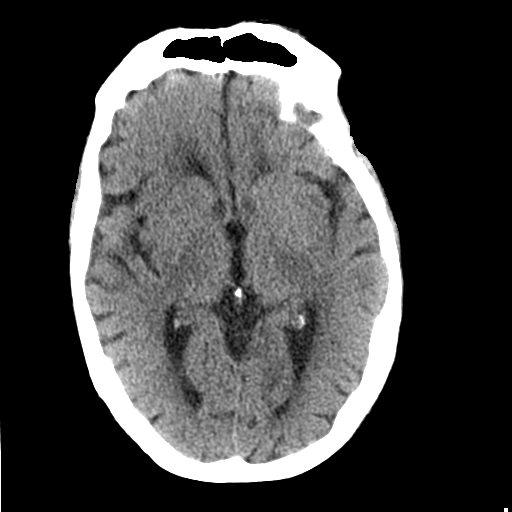
[im 16/35  bone]
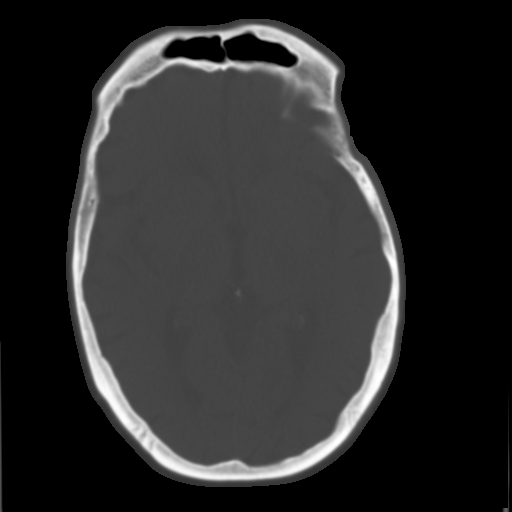
[im 19/35  brain]
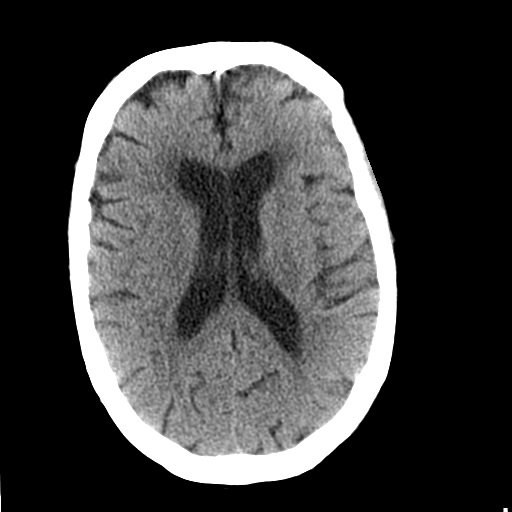
[im 23/35  brain]
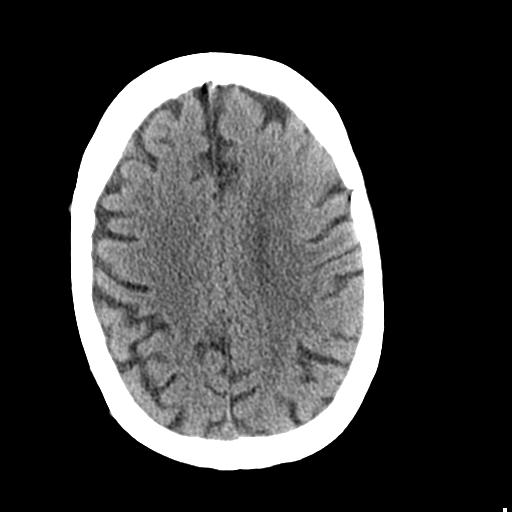
[im 26/35  brain]
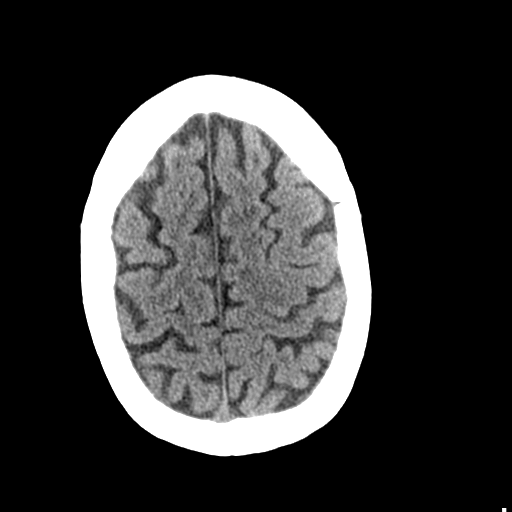
[im 29/35  brain]
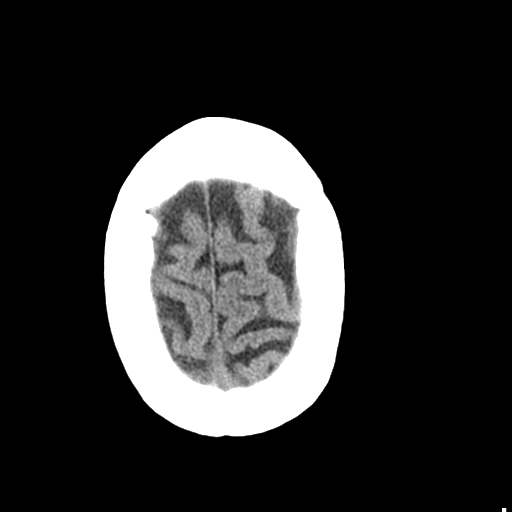
[im 29/35  bone]
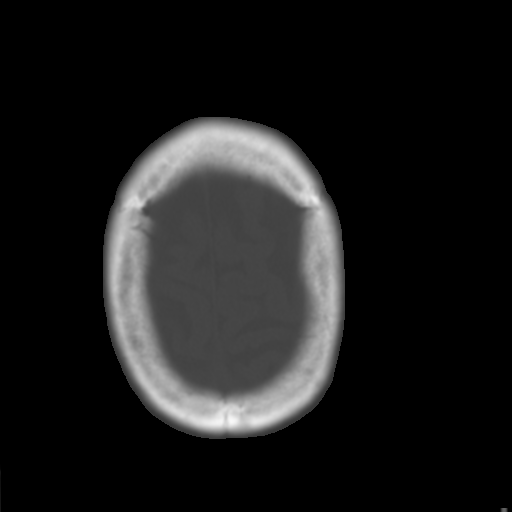
[im 32/35  brain]
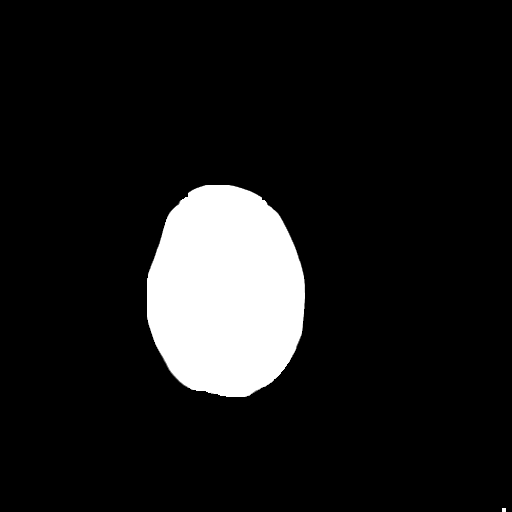

[Series 5: coronal soft tissue · coronal · 0.31mm/px · 3 of 73 slices shown]
[im 28/73  brain]
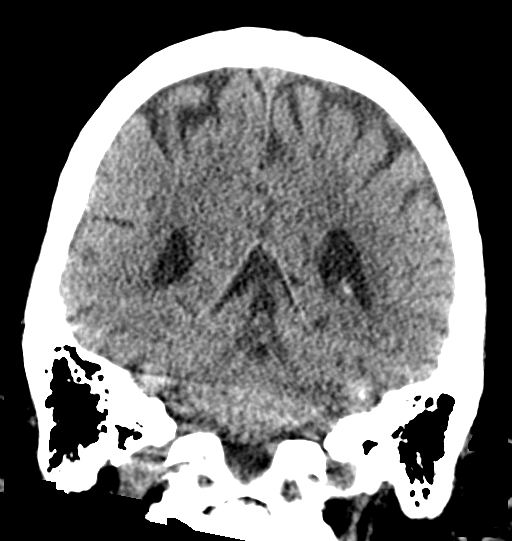
[im 34/73  brain]
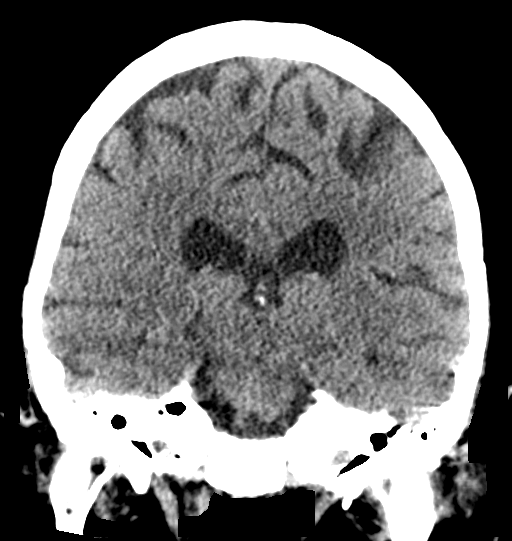
[im 39/73  brain]
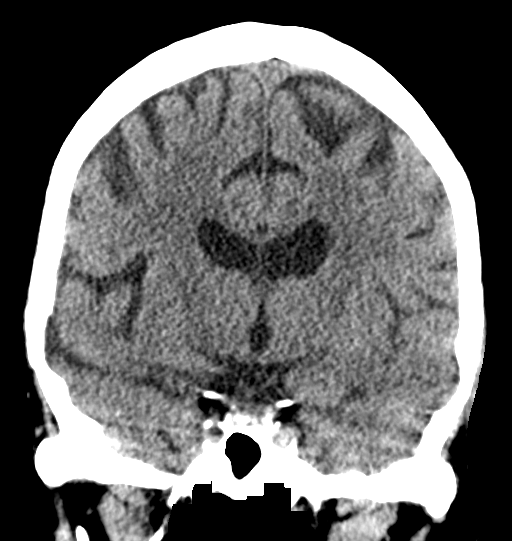

[Series 6: sagittal soft tissue · sagittal · 0.33mm/px · 3 of 54 slices shown]
[im 21/54  brain]
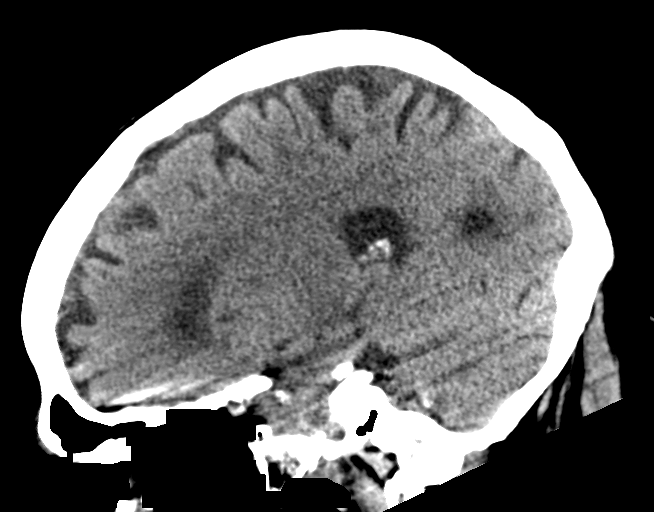
[im 27/54  brain]
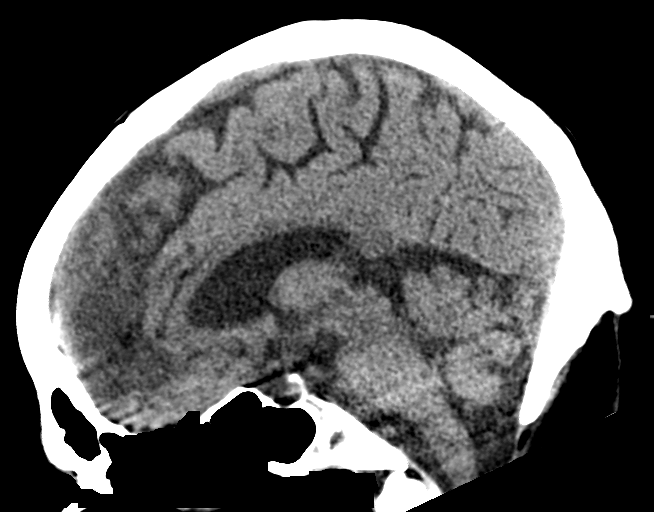
[im 33/54  brain]
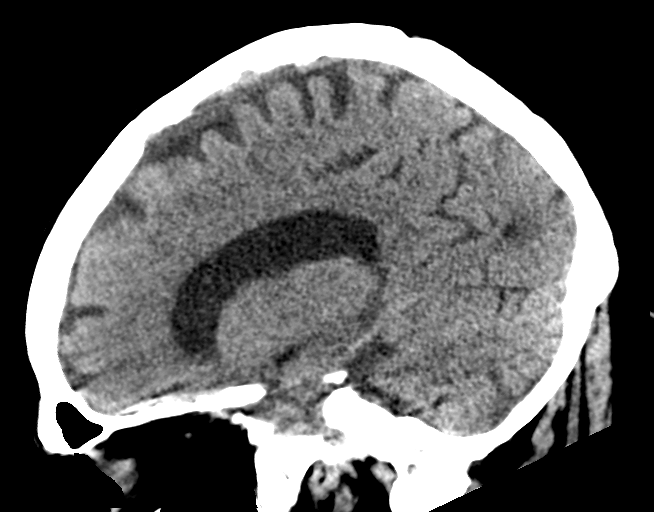

[16 of 47 positions shown; findings below may reference images not displayed]

FINDINGS: Brain: Mild chronic small vessel disease within the deep white
matter. No acute intracranial abnormality. Specifically, no
hemorrhage, hydrocephalus, mass lesion, acute infarction, or
significant intracranial injury.

Vascular: No hyperdense vessel or unexpected calcification.

Skull: No acute calvarial abnormality.

Sinuses/Orbits: Visualized paranasal sinuses and mastoids clear.
Orbital soft tissues unremarkable.

Other: None
IMPRESSION: Mild chronic small vessel disease throughout the deep white matter.
No acute intracranial abnormality.
